# Patient Record
Sex: Male | Born: 1965
Health system: Southern US, Community
[De-identification: ages and names within clinical notes are randomized; demographics above are authoritative.]

## PROBLEM LIST (undated history)

## (undated) DIAGNOSIS — I219 Acute myocardial infarction, unspecified: Secondary | ICD-10-CM

## (undated) DIAGNOSIS — F419 Anxiety disorder, unspecified: Secondary | ICD-10-CM

## (undated) DIAGNOSIS — I2510 Atherosclerotic heart disease of native coronary artery without angina pectoris: Secondary | ICD-10-CM

## (undated) DIAGNOSIS — E7800 Pure hypercholesterolemia, unspecified: Secondary | ICD-10-CM

## (undated) DIAGNOSIS — I480 Paroxysmal atrial fibrillation: Secondary | ICD-10-CM

## (undated) DIAGNOSIS — I509 Heart failure, unspecified: Secondary | ICD-10-CM

## (undated) DIAGNOSIS — I10 Essential (primary) hypertension: Secondary | ICD-10-CM

## (undated) DIAGNOSIS — Z720 Tobacco use: Secondary | ICD-10-CM

## (undated) DIAGNOSIS — E785 Hyperlipidemia, unspecified: Secondary | ICD-10-CM

## (undated) DIAGNOSIS — E119 Type 2 diabetes mellitus without complications: Secondary | ICD-10-CM

## (undated) DIAGNOSIS — J45909 Unspecified asthma, uncomplicated: Secondary | ICD-10-CM

## (undated) DIAGNOSIS — I5042 Chronic combined systolic (congestive) and diastolic (congestive) heart failure: Secondary | ICD-10-CM

## (undated) HISTORY — PX: CARDIAC CATHETERIZATION: SHX172

## (undated) HISTORY — DX: Chronic combined systolic (congestive) and diastolic (congestive) heart failure: I50.42

## (undated) HISTORY — DX: Paroxysmal atrial fibrillation: I48.0

## (undated) HISTORY — DX: Hyperlipidemia, unspecified: E78.5

## (undated) HISTORY — DX: Tobacco use: Z72.0

## (undated) HISTORY — DX: Type 2 diabetes mellitus without complications: E11.9

---

## 2017-10-04 ENCOUNTER — Encounter (HOSPITAL_BASED_OUTPATIENT_CLINIC_OR_DEPARTMENT_OTHER): Payer: Self-pay

## 2017-10-04 ENCOUNTER — Emergency Department (HOSPITAL_BASED_OUTPATIENT_CLINIC_OR_DEPARTMENT_OTHER): Payer: Self-pay

## 2017-10-04 ENCOUNTER — Emergency Department (HOSPITAL_COMMUNITY): Admission: EM | Admit: 2017-10-04 | Discharge: 2017-10-04 | Discharge status: Against Medical Advice | Payer: Self-pay

## 2017-10-04 ENCOUNTER — Emergency Department (HOSPITAL_BASED_OUTPATIENT_CLINIC_OR_DEPARTMENT_OTHER)
Admission: EM | Admit: 2017-10-04 | Discharge: 2017-10-04 | Disposition: A | Discharge status: Home / Self Care | Payer: Self-pay | Attending: Emergency Medicine | Admitting: Emergency Medicine

## 2017-10-04 ENCOUNTER — Other Ambulatory Visit: Payer: Self-pay

## 2017-10-04 DIAGNOSIS — K4091 Unilateral inguinal hernia, without obstruction or gangrene, recurrent: Secondary | ICD-10-CM

## 2017-10-04 DIAGNOSIS — I252 Old myocardial infarction: Secondary | ICD-10-CM | POA: Insufficient documentation

## 2017-10-04 DIAGNOSIS — E119 Type 2 diabetes mellitus without complications: Secondary | ICD-10-CM

## 2017-10-04 DIAGNOSIS — I2510 Atherosclerotic heart disease of native coronary artery without angina pectoris: Secondary | ICD-10-CM | POA: Insufficient documentation

## 2017-10-04 DIAGNOSIS — F17210 Nicotine dependence, cigarettes, uncomplicated: Secondary | ICD-10-CM | POA: Insufficient documentation

## 2017-10-04 DIAGNOSIS — I10 Essential (primary) hypertension: Secondary | ICD-10-CM

## 2017-10-04 DIAGNOSIS — K439 Ventral hernia without obstruction or gangrene: Secondary | ICD-10-CM

## 2017-10-04 DIAGNOSIS — J45909 Unspecified asthma, uncomplicated: Secondary | ICD-10-CM | POA: Insufficient documentation

## 2017-10-04 HISTORY — DX: Atherosclerotic heart disease of native coronary artery without angina pectoris: I25.10

## 2017-10-04 HISTORY — DX: Essential (primary) hypertension: I10

## 2017-10-04 HISTORY — DX: Acute myocardial infarction, unspecified: I21.9

## 2017-10-04 HISTORY — DX: Unspecified asthma, uncomplicated: J45.909

## 2017-10-04 HISTORY — DX: Pure hypercholesterolemia, unspecified: E78.00

## 2017-10-04 LAB — URINALYSIS, ROUTINE W REFLEX MICROSCOPIC
Bilirubin Urine: NEGATIVE
Glucose, UA: >=500 mg/dL — AB
Ketones, ur: NEGATIVE mg/dL
LEUKOCYTES UA: NEGATIVE
NITRITE: NEGATIVE
PROTEIN: 100 mg/dL — AB
Specific Gravity, Urine: 1.025 (ref 1.005–1.030)
pH: 6 (ref 5.0–8.0)

## 2017-10-04 LAB — COMPREHENSIVE METABOLIC PANEL
ALK PHOS: 70 U/L (ref 38–126)
ALT: 45 U/L — AB (ref 0–44)
AST: 28 U/L (ref 15–41)
Albumin: 4.4 g/dL (ref 3.5–5.0)
Anion gap: 11 (ref 5–15)
BUN: 10 mg/dL (ref 6–20)
CO2: 26 mmol/L (ref 22–32)
CREATININE: 0.85 mg/dL (ref 0.61–1.24)
Calcium: 9 mg/dL (ref 8.9–10.3)
Chloride: 99 mmol/L (ref 98–111)
Glucose, Bld: 314 mg/dL — ABNORMAL HIGH (ref 70–99)
Potassium: 3.8 mmol/L (ref 3.5–5.1)
Sodium: 136 mmol/L (ref 135–145)
Total Bilirubin: 0.5 mg/dL (ref 0.3–1.2)
Total Protein: 7.9 g/dL (ref 6.5–8.1)

## 2017-10-04 LAB — CBC WITH DIFFERENTIAL/PLATELET
BASOS ABS: 0.1 10*3/uL (ref 0.0–0.1)
Basophils Relative: 1 %
Eosinophils Absolute: 0.2 10*3/uL (ref 0.0–0.7)
Eosinophils Relative: 2 %
HCT: 48 % (ref 39.0–52.0)
Hemoglobin: 16.5 g/dL (ref 13.0–17.0)
LYMPHS PCT: 33 %
Lymphs Abs: 3.1 10*3/uL (ref 0.7–4.0)
MCH: 24.8 pg — ABNORMAL LOW (ref 26.0–34.0)
MCHC: 34.4 g/dL (ref 30.0–36.0)
MCV: 72.3 fL — ABNORMAL LOW (ref 78.0–100.0)
MONO ABS: 0.8 10*3/uL (ref 0.1–1.0)
Monocytes Relative: 8 %
NEUTROS PCT: 56 %
Neutro Abs: 5.3 10*3/uL (ref 1.7–7.7)
PLATELETS: 222 10*3/uL (ref 150–400)
RBC: 6.64 MIL/uL — AB (ref 4.22–5.81)
RDW: 14.9 % (ref 11.5–15.5)
WBC: 9.5 10*3/uL (ref 4.0–10.5)

## 2017-10-04 LAB — URINALYSIS, MICROSCOPIC (REFLEX): WBC, UA: NONE SEEN WBC/hpf (ref 0–5)

## 2017-10-04 MED ORDER — METFORMIN HCL 500 MG PO TABS: 500.0000 mg | ORAL_TABLET | Freq: Two times a day (BID) | ORAL | 0 refills | Status: DC

## 2017-10-04 MED ORDER — ONDANSETRON HCL 4 MG/2ML IJ SOLN
4.0000 mg | Freq: Once | INTRAMUSCULAR | Status: AC
  Administered 2017-10-04: 4 mg via INTRAVENOUS
  Filled 2017-10-04: qty 2

## 2017-10-04 MED ORDER — SODIUM CHLORIDE 0.9 % IV BOLUS
1000.0000 mL | Freq: Once | INTRAVENOUS | Status: AC
  Administered 2017-10-04: 1000 mL via INTRAVENOUS

## 2017-10-04 MED ORDER — AMLODIPINE BESYLATE 5 MG PO TABS: 5.0000 mg | ORAL_TABLET | Freq: Every day | ORAL | 0 refills | Status: DC

## 2017-10-04 MED ORDER — SODIUM CHLORIDE 0.9 % IV BOLUS: 1000.0000 mL | Freq: Once | INTRAVENOUS | Status: DC

## 2017-10-04 MED ORDER — MORPHINE SULFATE (PF) 4 MG/ML IV SOLN
4.0000 mg | Freq: Once | INTRAVENOUS | Status: AC
  Administered 2017-10-04: 4 mg via INTRAVENOUS
  Filled 2017-10-04: qty 1

## 2017-10-04 NOTE — ED Notes (Signed)
Pt denies hx of DM, but states he has not been to a doctor in a very long time, endorses increased thirst and increased urination lately

## 2017-10-04 NOTE — ED Triage Notes (Signed)
Called pt from lobby NO response

## 2017-10-04 NOTE — ED Provider Notes (Signed)
[facility] EMERGENCY DEPARTMENT Provider Note   CSN: [id number] Arrival date & time: 10/04/17  2021     History   Chief Complaint Chief Complaint  Patient presents with  . Groin Pain    HPI Larry Lewis is a 52 y.o. male.  Pt presents to the ED today with right sided groin pain.  The pt has noticed some swelling in his groin for a few months.  He also has had difficulty urinating.  Yesterday, he developed right sided flank pain.  He went to urgent care and they sent him here.  Pt denies f/c.     Past Medical History:  Diagnosis Date  . AMI (acute myocardial infarction) (HCC)   . Asthma   . Coronary artery disease   . High cholesterol   . Hypertension     There are no active problems to display for this patient.   Past Surgical History:  Procedure Laterality Date  . CARDIAC CATHETERIZATION          Home Medications    Prior to Admission medications   Medication Sig Start Date End Date Taking? Authorizing Provider  amLODipine (NORVASC) 5 MG tablet Take 1 tablet (5 mg total) by mouth daily. 10/04/17   Jacalyn Lefevre, MD  metFORMIN (GLUCOPHAGE) 500 MG tablet Take 1 tablet (500 mg total) by mouth 2 (two) times daily with a meal. 10/04/17   Jacalyn Lefevre, MD    Family History No family history on file.  Social History Social History   Tobacco Use  . Smoking status: Current Every Day Smoker    Packs/day: 1.00    Types: Cigarettes  . Smokeless tobacco: Never Used  Substance Use Topics  . Alcohol use: Never    Frequency: Never  . Drug use: Never     Allergies   Penicillins   Review of Systems Review of Systems  Genitourinary: Positive for difficulty urinating and flank pain.  All other systems reviewed and are negative.    Physical Exam Updated Vital Signs BP (!) 160/111 (BP Location: Right Arm)   Pulse (!) 102   Temp 98.2 F (36.8 C) (Oral)   Resp 18   Ht 5\' 8"  (1.727 m)   Wt 85.4 kg (188 lb 4.4 oz)   SpO2 97%   BMI 28.63  kg/m   Physical Exam  Constitutional: He is oriented to person, place, and time. He appears well-developed and well-nourished.  HENT:  Head: Normocephalic and atraumatic.  Right Ear: External ear normal.  Left Ear: External ear normal.  Nose: Nose normal.  Mouth/Throat: Oropharynx is clear and moist.  Eyes: Pupils are equal, round, and reactive to light. Conjunctivae and EOM are normal.  Neck: Normal range of motion. Neck supple.  Cardiovascular: Regular rhythm, normal heart sounds and intact distal pulses. Tachycardia present.  Pulmonary/Chest: Effort normal and breath sounds normal.  Abdominal: Soft. Bowel sounds are normal. A hernia is present. Hernia confirmed positive in the right inguinal area.  Ventral hernia.  Easily reducible.  Right inguinal hernia.  Easily reducible.  Musculoskeletal: Normal range of motion.  Neurological: He is alert and oriented to person, place, and time.  Skin: Skin is warm. Capillary refill takes less than 2 seconds.  Psychiatric: He has a normal mood and affect. His behavior is normal. Judgment and thought content normal.  Nursing note and vitals reviewed.    ED Treatments / Results  Labs (all labs ordered are listed, but only abnormal results are displayed) Labs Reviewed  COMPREHENSIVE  METABOLIC PANEL - Abnormal; Notable for the following components:      Result Value   Glucose, Bld 314 (*)    ALT 45 (*)    All other components within normal limits  CBC WITH DIFFERENTIAL/PLATELET - Abnormal; Notable for the following components:   RBC 6.64 (*)    MCV 72.3 (*)    MCH 24.8 (*)    All other components within normal limits  URINALYSIS, ROUTINE W REFLEX MICROSCOPIC - Abnormal; Notable for the following components:   Glucose, UA >=500 (*)    Hgb urine dipstick TRACE (*)    Protein, ur 100 (*)    All other components within normal limits  URINALYSIS, MICROSCOPIC (REFLEX) - Abnormal; Notable for the following components:   Bacteria, UA RARE  (*)    All other components within normal limits  URINE CULTURE    EKG None  Radiology Ct Renal Stone Study  Result Date: 10/04/2017 CLINICAL DATA:  Right groin and flank pain. EXAM: CT ABDOMEN AND PELVIS WITHOUT CONTRAST TECHNIQUE: Multidetector CT imaging of the abdomen and pelvis was performed following the standard protocol without IV contrast. COMPARISON:  None. FINDINGS: Lower chest: We partially include a pulmonary nodule in the left lower lobe. The included portion of the nodule measures up to 0.9 by 0.6 cm on image 1/4. 0.6 by 0.4 cm by 0.2 cm nodule in the right lower lobe. Circumflex and right coronary artery atherosclerotic calcification is present. Hepatobiliary: Diffuse hepatic steatosis. A gallstone in the gallbladder measures up to 1.2 cm in diameter. Pancreas: Unremarkable Spleen: Unremarkable Adrenals/Urinary Tract: Several faint hypodense lesions in the right kidney are probably cysts but technically too small to characterize. No urinary tract calculi identified. Indirect right inguinal hernia contains a portion of the urinary bladder as well as a portion of the appendix. Stomach/Bowel: Amyand hernia noted without findings of acute appendicitis. Prominent stool throughout the colon favors constipation. Vascular/Lymphatic: Aortoiliac atherosclerotic vascular disease. Reproductive: Unremarkable Other: No supplemental non-categorized findings. Musculoskeletal: Degenerative disc disease L5-S1 with and left paracentral disc protrusion and facet arthropathy contributing to bilateral foraminal impingement. IMPRESSION: 1. Indirect right inguinal hernia containing adipose tissue, part of the vermiform appendix (Amyand hernia), as well as a portion of the right urinary bladder. No findings of acute appendicitis. 2. The included portion of a left lower lobe pulmonary nodule measures 6 by 9 mm on the top most image. Non-contrast chest CT at 6-12 months is recommended. If the nodule is stable at  time of repeat CT, then future CT at 18-24 months (from today's scan) is considered optional for low-risk patients, but is recommended for high-risk patients. This recommendation follows the consensus statement: Guidelines for Management of Incidental Pulmonary Nodules Detected on CT Images: From the Fleischner Society 2017; Radiology 2017; 284:228-243. 3.  Aortic Atherosclerosis (ICD10-I70.0).  Coronary atherosclerosis. 4. Diffuse hepatic steatosis. 5. Cholelithiasis. 6.  Prominent stool throughout the colon favors constipation. 7. Bilateral foraminal impingement at L5-S1. Electronically Signed   By: Gaylyn RongWalter  Liebkemann M.D.   On: 10/04/2017 21:39    Procedures Procedures (including critical care time)  Medications Ordered in ED Medications  sodium chloride 0.9 % bolus 1,000 mL (1,000 mLs Intravenous New Bag/Given 10/04/17 2112)  ondansetron (ZOFRAN) injection 4 mg (4 mg Intravenous Given 10/04/17 2124)  morphine 4 MG/ML injection 4 mg (4 mg Intravenous Given 10/04/17 2125)     Initial Impression / Assessment and Plan / ED Course  I have reviewed the triage vital signs and the nursing notes.  Pertinent labs & imaging results that were available during my care of the patient were reviewed by me and considered in my medical decision making (see chart for details).     Pt is feeling much better.  Pt is a new onset diabetic patient.   No DKA. The pt does not have a doctor.  I made a referral to case management to try to get pt a doctor and meds and diabetic education.  The pt is encouraged to return if worse.  Final Clinical Impressions(s) / ED Diagnoses   Final diagnoses:  Essential hypertension  New onset type 2 diabetes mellitus (HCC)  Unilateral recurrent inguinal hernia without obstruction or gangrene  Ventral hernia without obstruction or gangrene    ED Discharge Orders        Ordered    Ambulatory referral to Nutrition and Diabetic Education     10/04/17 2214    metFORMIN  (GLUCOPHAGE) 500 MG tablet  2 times daily with meals     10/04/17 2215    amLODipine (NORVASC) 5 MG tablet  Daily     10/04/17 2215       Jacalyn Lefevre, MD 10/04/17 2218

## 2017-10-04 NOTE — ED Triage Notes (Signed)
R groin and flank pain x 2 months. Pain got worse yesterday and pt c/o dysuria now. Pt saw and U/C care today and sent here to r/o kidney stone.

## 2017-10-05 NOTE — Care Management Note (Signed)
Case Management Note  CM consulted for no pcp and no ins with need for DM follow up.  CM noted [facility] information was placed on AVS and pt can make an appointment on his own.  CM sent a message to the [facility] for possible appointment cancellation openings.  Updated Dr. Particia NearingHaviland via messages.  No further CM needs noted at this time.  Adalaya Irion, Lynnae SandhoffAngela N, RN 10/05/2017, 10:06 AM

## 2017-10-06 LAB — URINE CULTURE: Culture: NO GROWTH

## 2017-10-09 ENCOUNTER — Telehealth (INDEPENDENT_AMBULATORY_CARE_PROVIDER_SITE_OTHER): Payer: Self-pay

## 2017-10-09 NOTE — Telephone Encounter (Signed)
Message received from Eldridge AbrahamsAngela Kritzer, RN CM requesting a hospital [facility]. Informed her that there are not any appointments available. [facility] and the patient will need to call to check availability and schedule an appointment.   Update provided to Breck CoonsA. Kritzer, RN CM

## 2017-10-11 ENCOUNTER — Telehealth: Payer: Self-pay

## 2017-10-11 NOTE — Telephone Encounter (Signed)
Attempted to contact the patient to schedule a hospital follow up appointment as there is an appointment available 10/19/17. Call placed to # [phone] and this CM was informed that it is a wrong number

## 2019-07-04 ENCOUNTER — Emergency Department (HOSPITAL_BASED_OUTPATIENT_CLINIC_OR_DEPARTMENT_OTHER): Payer: Self-pay

## 2019-07-04 ENCOUNTER — Other Ambulatory Visit: Payer: Self-pay

## 2019-07-04 ENCOUNTER — Inpatient Hospital Stay (HOSPITAL_BASED_OUTPATIENT_CLINIC_OR_DEPARTMENT_OTHER)
Admission: EM | Admit: 2019-07-04 | Discharge: 2019-07-11 | DRG: 286 | Disposition: A | Discharge status: Home / Self Care | Payer: Self-pay | Attending: Family Medicine | Admitting: Family Medicine

## 2019-07-04 ENCOUNTER — Encounter (HOSPITAL_BASED_OUTPATIENT_CLINIC_OR_DEPARTMENT_OTHER): Payer: Self-pay

## 2019-07-04 DIAGNOSIS — E876 Hypokalemia: Secondary | ICD-10-CM | POA: Diagnosis present

## 2019-07-04 DIAGNOSIS — E1165 Type 2 diabetes mellitus with hyperglycemia: Secondary | ICD-10-CM | POA: Diagnosis present

## 2019-07-04 DIAGNOSIS — G4700 Insomnia, unspecified: Secondary | ICD-10-CM | POA: Diagnosis present

## 2019-07-04 DIAGNOSIS — R590 Localized enlarged lymph nodes: Secondary | ICD-10-CM | POA: Diagnosis present

## 2019-07-04 DIAGNOSIS — Z955 Presence of coronary angioplasty implant and graft: Secondary | ICD-10-CM

## 2019-07-04 DIAGNOSIS — Z794 Long term (current) use of insulin: Secondary | ICD-10-CM

## 2019-07-04 DIAGNOSIS — R0902 Hypoxemia: Secondary | ICD-10-CM

## 2019-07-04 DIAGNOSIS — K4090 Unilateral inguinal hernia, without obstruction or gangrene, not specified as recurrent: Secondary | ICD-10-CM | POA: Diagnosis present

## 2019-07-04 DIAGNOSIS — R000 Tachycardia, unspecified: Secondary | ICD-10-CM | POA: Diagnosis present

## 2019-07-04 DIAGNOSIS — Z7982 Long term (current) use of aspirin: Secondary | ICD-10-CM

## 2019-07-04 DIAGNOSIS — Z20822 Contact with and (suspected) exposure to covid-19: Secondary | ICD-10-CM | POA: Diagnosis present

## 2019-07-04 DIAGNOSIS — E785 Hyperlipidemia, unspecified: Secondary | ICD-10-CM | POA: Diagnosis present

## 2019-07-04 DIAGNOSIS — I252 Old myocardial infarction: Secondary | ICD-10-CM

## 2019-07-04 DIAGNOSIS — T501X5A Adverse effect of loop [high-ceiling] diuretics, initial encounter: Secondary | ICD-10-CM | POA: Diagnosis present

## 2019-07-04 DIAGNOSIS — F17210 Nicotine dependence, cigarettes, uncomplicated: Secondary | ICD-10-CM | POA: Diagnosis present

## 2019-07-04 DIAGNOSIS — J90 Pleural effusion, not elsewhere classified: Secondary | ICD-10-CM | POA: Diagnosis present

## 2019-07-04 DIAGNOSIS — I5041 Acute combined systolic (congestive) and diastolic (congestive) heart failure: Secondary | ICD-10-CM | POA: Diagnosis present

## 2019-07-04 DIAGNOSIS — I358 Other nonrheumatic aortic valve disorders: Secondary | ICD-10-CM | POA: Diagnosis present

## 2019-07-04 DIAGNOSIS — J9601 Acute respiratory failure with hypoxia: Secondary | ICD-10-CM | POA: Diagnosis present

## 2019-07-04 DIAGNOSIS — Z880 Allergy status to penicillin: Secondary | ICD-10-CM

## 2019-07-04 DIAGNOSIS — I25119 Atherosclerotic heart disease of native coronary artery with unspecified angina pectoris: Secondary | ICD-10-CM | POA: Diagnosis present

## 2019-07-04 DIAGNOSIS — Z7902 Long term (current) use of antithrombotics/antiplatelets: Secondary | ICD-10-CM

## 2019-07-04 DIAGNOSIS — F419 Anxiety disorder, unspecified: Secondary | ICD-10-CM | POA: Diagnosis present

## 2019-07-04 DIAGNOSIS — I2510 Atherosclerotic heart disease of native coronary artery without angina pectoris: Secondary | ICD-10-CM

## 2019-07-04 DIAGNOSIS — I248 Other forms of acute ischemic heart disease: Secondary | ICD-10-CM | POA: Diagnosis present

## 2019-07-04 DIAGNOSIS — I110 Hypertensive heart disease with heart failure: Principal | ICD-10-CM | POA: Diagnosis present

## 2019-07-04 DIAGNOSIS — J45909 Unspecified asthma, uncomplicated: Secondary | ICD-10-CM | POA: Diagnosis present

## 2019-07-04 DIAGNOSIS — J9811 Atelectasis: Secondary | ICD-10-CM | POA: Diagnosis present

## 2019-07-04 DIAGNOSIS — K8020 Calculus of gallbladder without cholecystitis without obstruction: Secondary | ICD-10-CM | POA: Diagnosis present

## 2019-07-04 DIAGNOSIS — I509 Heart failure, unspecified: Secondary | ICD-10-CM

## 2019-07-04 DIAGNOSIS — Z720 Tobacco use: Secondary | ICD-10-CM | POA: Diagnosis present

## 2019-07-04 DIAGNOSIS — E7800 Pure hypercholesterolemia, unspecified: Secondary | ICD-10-CM | POA: Diagnosis present

## 2019-07-04 DIAGNOSIS — Z79899 Other long term (current) drug therapy: Secondary | ICD-10-CM

## 2019-07-04 HISTORY — DX: Anxiety disorder, unspecified: F41.9

## 2019-07-04 LAB — CBC
HCT: 50.9 % (ref 39.0–52.0)
Hemoglobin: 15.9 g/dL (ref 13.0–17.0)
MCH: 24.3 pg — ABNORMAL LOW (ref 26.0–34.0)
MCHC: 31.2 g/dL (ref 30.0–36.0)
MCV: 77.7 fL — ABNORMAL LOW (ref 80.0–100.0)
Platelets: 312 10*3/uL (ref 150–400)
RBC: 6.55 MIL/uL — ABNORMAL HIGH (ref 4.22–5.81)
RDW: 14.5 % (ref 11.5–15.5)
WBC: 9.1 10*3/uL (ref 4.0–10.5)
nRBC: 0 % (ref 0.0–0.2)

## 2019-07-04 LAB — BASIC METABOLIC PANEL
Anion gap: 11 (ref 5–15)
BUN: 8 mg/dL (ref 6–20)
CO2: 25 mmol/L (ref 22–32)
Calcium: 9.4 mg/dL (ref 8.9–10.3)
Chloride: 100 mmol/L (ref 98–111)
Creatinine, Ser: 0.79 mg/dL (ref 0.61–1.24)
GFR calc Af Amer: >60 mL/min (ref >60)
GFR calc non Af Amer: >60 mL/min (ref >60)
Glucose, Bld: 381 mg/dL — ABNORMAL HIGH (ref 70–99)
Potassium: 3.6 mmol/L (ref 3.5–5.1)
Sodium: 136 mmol/L (ref 135–145)

## 2019-07-04 LAB — TROPONIN I (HIGH SENSITIVITY)
Troponin I (High Sensitivity): 25 ng/L — ABNORMAL HIGH (ref <18)
Troponin I (High Sensitivity): 25 ng/L — ABNORMAL HIGH (ref <18)

## 2019-07-04 LAB — RESPIRATORY PANEL BY RT PCR (FLU A&B, COVID)
Influenza A by PCR: NEGATIVE
Influenza B by PCR: NEGATIVE
SARS Coronavirus 2 by RT PCR: NEGATIVE

## 2019-07-04 LAB — BRAIN NATRIURETIC PEPTIDE: B Natriuretic Peptide: 267.6 pg/mL — ABNORMAL HIGH (ref 0.0–100.0)

## 2019-07-04 LAB — CBG MONITORING, ED: Glucose-Capillary: 97 mg/dL (ref 70–99)

## 2019-07-04 MED ORDER — INSULIN ASPART 100 UNIT/ML ~~LOC~~ SOLN
SUBCUTANEOUS | Status: AC
  Administered 2019-07-04: 10 [IU] via INTRAVENOUS
  Filled 2019-07-04: qty 1

## 2019-07-04 MED ORDER — FUROSEMIDE 10 MG/ML IJ SOLN
40.0000 mg | Freq: Once | INTRAMUSCULAR | Status: AC
  Administered 2019-07-04: 20:00:00 40 mg via INTRAVENOUS
  Filled 2019-07-04: qty 4

## 2019-07-04 MED ORDER — NITROGLYCERIN 0.4 MG SL SUBL
0.4000 mg | SUBLINGUAL_TABLET | SUBLINGUAL | Status: DC | PRN
Start: 2019-07-04 — End: 2019-07-11
  Administered 2019-07-04 – 2019-07-10 (×2): 0.4 mg via SUBLINGUAL
  Filled 2019-07-04 (×2): qty 1

## 2019-07-04 MED ORDER — IOHEXOL 350 MG/ML SOLN
100.0000 mL | Freq: Once | INTRAVENOUS | Status: AC | PRN
  Administered 2019-07-04: 22:00:00 100 mL via INTRAVENOUS

## 2019-07-04 MED ORDER — INSULIN ASPART 100 UNIT/ML IV SOLN
10.0000 [IU] | Freq: Once | INTRAVENOUS | Status: AC
  Filled 2019-07-04: qty 0.1

## 2019-07-04 MED ORDER — INSULIN REGULAR HUMAN 100 UNIT/ML IJ SOLN
10.0000 [IU] | Freq: Once | INTRAMUSCULAR | Status: DC
  Filled 2019-07-04: qty 3

## 2019-07-04 NOTE — ED Triage Notes (Signed)
Pt c/o difficulty breathing today. Pt is anxious in appearance. Pt is poor historian. States he has trouble breathing every night for "a long time." Pt states he gets "choked up" at night when he sleeps. Problem is not positional, stating no position makes it better or worse. Pt able to be distracted from hyperventilations and coached to slow breathing. Pt SpO2 93% during triage and lung sounds clear & =.   Pt's native language is Arabic, but is able to speak [place].

## 2019-07-04 NOTE — ED Provider Notes (Signed)
[facility] EMERGENCY DEPARTMENT Provider Note   CSN: [id number] Arrival date & time: 07/04/19  1746     History Chief Complaint  Patient presents with  . Respiratory Distress    Larry Lewis is a 54 y.o. male.  Patient just returned after spending a year in [place] which is his home country 2 weeks ago.  Patient states that for the past 4 months that he has been having some trouble breathing and needs to this sleep sort of propped up.  He gets choked up at night when he sleeps.  Patient states no position makes it better.  But he is definitely more comfortable sitting up.  He was in some respiratory distress when he arrived.  Oxygen saturations maybe at best 93%.  Was taken to the recess room started on 4 L of oxygen.  Was tachycardic blood pressure was not elevated or hypotensive.  Respiratory rate was 45 when he first arrived.  On the oxygen he improved.  Past medical history although we do not have much documentation for talks about coronary artery disease history of asthma hypertension and high cholesterol.  Apparently had a cardiac catheterization in the past.  Patient does smoke.  No wheezing was heard.  Patient denies any chest pain denies any leg swelling.  Patient states symptoms been present for 4 months.  But it seems as if they are certainly worse today.  Patient does not use oxygen at home.        Past Medical History:  Diagnosis Date  . AMI (acute myocardial infarction) (HCC)   . Anxiety   . Asthma   . Coronary artery disease   . High cholesterol   . Hypertension     Patient Active Problem List   Diagnosis Date Noted  . Acute respiratory failure with hypoxia (HCC) 07/04/2019    Past Surgical History:  Procedure Laterality Date  . CARDIAC CATHETERIZATION         No family history on file.  Social History   Tobacco Use  . Smoking status: Current Every Day Smoker    Packs/day: 1.00    Types: Cigarettes  . Smokeless tobacco: Never Used   Substance Use Topics  . Alcohol use: Never  . Drug use: Never    Home Medications Prior to Admission medications   Medication Sig Start Date End Date Taking? Authorizing Provider  amLODipine (NORVASC) 5 MG tablet Take 1 tablet (5 mg total) by mouth daily. 10/04/17   Jacalyn Lefevre, MD  metFORMIN (GLUCOPHAGE) 500 MG tablet Take 1 tablet (500 mg total) by mouth 2 (two) times daily with a meal. 10/04/17   Jacalyn Lefevre, MD    Allergies    Penicillins  Review of Systems   Review of Systems  Constitutional: Negative for chills and fever.  HENT: Negative for congestion, rhinorrhea and sore throat.   Eyes: Negative for visual disturbance.  Respiratory: Positive for shortness of breath. Negative for cough.   Cardiovascular: Negative for chest pain and leg swelling.  Gastrointestinal: Negative for abdominal pain, diarrhea, nausea and vomiting.  Genitourinary: Negative for dysuria.  Musculoskeletal: Negative for back pain and neck pain.  Skin: Negative for rash.  Neurological: Negative for dizziness, light-headedness and headaches.  Hematological: Does not bruise/bleed easily.  Psychiatric/Behavioral: Negative for confusion.    Physical Exam Updated Vital Signs BP 124/90   Pulse (!) 106   Resp (!) 31   Ht 1.753 m (5\' 9" )   Wt 80.7 kg   SpO2 93%  BMI 26.29 kg/m   Physical Exam Vitals and nursing note reviewed.  Constitutional:      Appearance: Normal appearance. He is well-developed.  HENT:     Head: Normocephalic and atraumatic.  Eyes:     Extraocular Movements: Extraocular movements intact.     Conjunctiva/sclera: Conjunctivae normal.     Pupils: Pupils are equal, round, and reactive to light.  Cardiovascular:     Rate and Rhythm: Regular rhythm. Tachycardia present.     Heart sounds: No murmur.  Pulmonary:     Effort: Respiratory distress present.     Breath sounds: Normal breath sounds. No wheezing.  Abdominal:     Palpations: Abdomen is soft.     Tenderness:  There is no abdominal tenderness.  Musculoskeletal:        General: Normal range of motion.     Cervical back: Normal range of motion and neck supple.     Right lower leg: No edema.     Left lower leg: No edema.  Skin:    General: Skin is warm and dry.     Capillary Refill: Capillary refill takes less than 2 seconds.  Neurological:     General: No focal deficit present.     Mental Status: He is alert and oriented to person, place, and time.     Cranial Nerves: No cranial nerve deficit.     Sensory: No sensory deficit.     Motor: No weakness.     ED Results / Procedures / Treatments   Labs (all labs ordered are listed, but only abnormal results are displayed) Labs Reviewed  BASIC METABOLIC PANEL - Abnormal; Notable for the following components:      Result Value   Glucose, Bld 381 (*)    All other components within normal limits  CBC - Abnormal; Notable for the following components:   RBC 6.55 (*)    MCV 77.7 (*)    MCH 24.3 (*)    All other components within normal limits  BRAIN NATRIURETIC PEPTIDE - Abnormal; Notable for the following components:   B Natriuretic Peptide 267.6 (*)    All other components within normal limits  TROPONIN I (HIGH SENSITIVITY) - Abnormal; Notable for the following components:   Troponin I (High Sensitivity) 25 (*)    All other components within normal limits  TROPONIN I (HIGH SENSITIVITY) - Abnormal; Notable for the following components:   Troponin I (High Sensitivity) 25 (*)    All other components within normal limits  RESPIRATORY PANEL BY RT PCR (FLU A&B, COVID)    EKG EKG Interpretation  Date/Time:  Thursday July 04 2019 19:52:33 EDT Ventricular Rate:  123 PR Interval:    QRS Duration: 112 QT Interval:  331 QTC Calculation: 474 R Axis:   77 Text Interpretation: Sinus tachycardia Probable LVH with secondary repol abnrm ST depression, consider ischemia, diffuse lds Anterior ST elevation, probably due to LVH Baseline wander in  lead(s) V1 Confirmed by Vanetta Mulders [phone]) on 07/04/2019 9:44:19 PM   Radiology CT Angio Chest PE W/Cm &/Or Wo Cm  Result Date: 07/04/2019 CLINICAL DATA:  Shortness of breath EXAM: CT ANGIOGRAPHY CHEST WITH CONTRAST TECHNIQUE: Multidetector CT imaging of the chest was performed using the standard protocol during bolus administration of intravenous contrast. Multiplanar CT image reconstructions and MIPs were obtained to evaluate the vascular anatomy. CONTRAST:  OMNIPAQUE IOHEXOL 350 MG/ML SOLN COMPARISON:  07/04/2019 FINDINGS: Cardiovascular: This is a technically adequate evaluation of the pulmonary vasculature. No filling defects  or pulmonary emboli. There is prominent left ventricular and left atrial dilatation. No pericardial effusion. Mild atherosclerosis of the aorta and coronary vessels. No evidence of thoracic aortic aneurysm or dissection. Mediastinum/Nodes: Subcarinal adenopathy measuring up to 14 mm in short axis. Borderline enlarged pretracheal nodes. These are nonspecific. Thyroid, trachea, and esophagus are unremarkable. Lungs/Pleura: There are moderate bilateral pleural effusions, estimated 1 L each. Areas of compressive atelectasis are seen within the dependent lower lobes. No airspace disease or pneumothorax. Central airways are patent. Upper Abdomen: Calcified gallstone is identified.  No cholecystitis. Musculoskeletal: No acute or destructive bony lesions. Reconstructed images demonstrate no additional findings. Review of the MIP images confirms the above findings. IMPRESSION: 1. No evidence of pulmonary embolus. 2. Moderate bilateral pleural effusions and dependent atelectasis. 3. Cardiomegaly, with prominent left atrial and left ventricular dilatation. 4. Likely reactive mediastinal adenopathy. 5.  Aortic Atherosclerosis (ICD10-I70.0). 6. Cholelithiasis. Electronically Signed   By: Sharlet Salina M.D.   On: 07/04/2019 21:58   DG Chest Port 1 View  Result Date:  07/04/2019 CLINICAL DATA:  Shortness of breath EXAM: PORTABLE CHEST 1 VIEW COMPARISON:  None. FINDINGS: There is cardiomegaly with pulmonary venous hypertension. There is diffuse interstitial thickening with suspected interstitial edema. There is atelectatic change in the medial left base with slight airspace opacity. No pleural effusions evident. No adenopathy. No bone lesions. IMPRESSION: Cardiomegaly with pulmonary vascular congestion. There is interstitial thickening, likely due to interstitial edema. Medial left base atelectasis with suspect developing pneumonia or alveolar edema. Overall the appearance is concerning for a degree of congestive heart failure. There may well be superimposed pneumonia in the medial left base. Electronically Signed   By: Bretta Bang III M.D.   On: 07/04/2019 19:10    Procedures Procedures (including critical care time)  CRITICAL CARE Performed by: Vanetta Mulders Total critical care time: 30 minutes Critical care time was exclusive of separately billable procedures and treating other patients. Critical care was necessary to treat or prevent imminent or life-threatening deterioration. Critical care was time spent personally by me on the following activities: development of treatment plan with patient and/or surrogate as well as nursing, discussions with consultants, evaluation of patient's response to treatment, examination of patient, obtaining history from patient or surrogate, ordering and performing treatments and interventions, ordering and review of laboratory studies, ordering and review of radiographic studies, pulse oximetry and re-evaluation of patient's condition.    Medications Ordered in ED Medications  nitroGLYCERIN (NITROSTAT) SL tablet 0.4 mg (0.4 mg Sublingual Given 07/04/19 2012)  furosemide (LASIX) injection 40 mg (40 mg Intravenous Given 07/04/19 1930)  iohexol (OMNIPAQUE) 350 MG/ML injection 100 mL (100 mLs Intravenous Contrast Given  07/04/19 2146)  insulin aspart (novoLOG) injection 10 Units (10 Units Intravenous Given 07/04/19 2227)    ED Course  I have reviewed the triage vital signs and the nursing notes.  Pertinent labs & imaging results that were available during my care of the patient were reviewed by me and considered in my medical decision making (see chart for details).    MDM Rules/Calculators/A&P                     Patient presented in acute respiratory distress.  But on 4 L of oxygen he settled down.  Then he went on to have another attack of shortness of breath.  And was placed on 10 L high flow nasal cannula.  Feels better on that.  But shortly after that he was given 4 mg  of Lasix which made him feel tremendously better.  Covid testing was negative.  CT angio chest does not show pulmonary embolus.  Does show bilateral pleural effusions moderate size.  Clinically it seemed as if based on the chest x-ray as well and his elevated BNP that he had congestive heart failure.  Patient does not have a known history of congestive heart failure.  But he has a history of coronary artery disease high cholesterol and hypertension and apparently had a cardiac catheterization in the past.  Patient is spent the last year in [place].  Return back to the states 2 weeks ago.  Therefore also the concern for PE but that is been ruled out.  Patient not normally on Lasix.  Patient denied any chest pain troponins were 25x2 no significant delta.  No leukocytosis.  No significant electrolyte abnormalities.  In addition to the Lasix which made a significant difference patient received sublingual nitroglycerin.  Was not started on nitroglycerin drip.  Blood pressures were not elevated.  Patient is a smoker so there possibly could be a component of COPD on top of the CHF.  But we never heard any wheezing.  Discussed with hospitalist who will admit to telemetry.  Orders have been placed.  Final Clinical Impression(s) / ED Diagnoses Final  diagnoses:  Acute on chronic congestive heart failure, unspecified heart failure type ([place])  Pleural effusion, bilateral  Hypoxia    Rx / DC Orders ED Discharge Orders    None       Fredia Sorrow, MD 07/04/19 2312

## 2019-07-04 NOTE — ED Notes (Signed)
Noted pt SpO2 drops 88% with talking and moving. Pt placed on 4L via N/C.

## 2019-07-04 NOTE — Progress Notes (Signed)
Removed patient from 4 liter nasal cannula and placed patient on 10 liter Hi-Flo nasal cannula due to patient's SPO2 dropping below 90% and increased RR and WOB breathing.  Patient's SPO2 increased to 94% and WOB of breathing has decreased.

## 2019-07-04 NOTE — Progress Notes (Signed)
Ct delayed due to patient condition, RN to call when ready.

## 2019-07-04 NOTE — ED Notes (Signed)
Patient's son calls for help; patient sitting up in bed with difficulty breathing; Patient denies any pain; states having difficulty breathing and states feels like something is in this throat. RT at bedside to assess. Dr Deretha Emory at bedside.

## 2019-07-05 ENCOUNTER — Inpatient Hospital Stay (HOSPITAL_COMMUNITY): Payer: Self-pay

## 2019-07-05 DIAGNOSIS — Z794 Long term (current) use of insulin: Secondary | ICD-10-CM | POA: Diagnosis present

## 2019-07-05 DIAGNOSIS — E1165 Type 2 diabetes mellitus with hyperglycemia: Secondary | ICD-10-CM

## 2019-07-05 DIAGNOSIS — Z720 Tobacco use: Secondary | ICD-10-CM | POA: Diagnosis present

## 2019-07-05 DIAGNOSIS — J90 Pleural effusion, not elsewhere classified: Secondary | ICD-10-CM | POA: Diagnosis present

## 2019-07-05 DIAGNOSIS — I509 Heart failure, unspecified: Secondary | ICD-10-CM

## 2019-07-05 LAB — HEMOGLOBIN A1C
Hgb A1c MFr Bld: 12.2 % — ABNORMAL HIGH (ref 4.8–5.6)
Mean Plasma Glucose: 303.44 mg/dL

## 2019-07-05 LAB — GLUCOSE, CAPILLARY
Glucose-Capillary: 449 mg/dL — ABNORMAL HIGH (ref 70–99)
Glucose-Capillary: 376 mg/dL — ABNORMAL HIGH (ref 70–99)
Glucose-Capillary: 258 mg/dL — ABNORMAL HIGH (ref 70–99)

## 2019-07-05 LAB — URINALYSIS, ROUTINE W REFLEX MICROSCOPIC
Bacteria, UA: NONE SEEN
Bilirubin Urine: NEGATIVE
Glucose, UA: >=500 mg/dL — AB
Hgb urine dipstick: NEGATIVE
Ketones, ur: NEGATIVE mg/dL
Leukocytes,Ua: NEGATIVE
Nitrite: NEGATIVE
Protein, ur: NEGATIVE mg/dL
Specific Gravity, Urine: 1.002 — ABNORMAL LOW (ref 1.005–1.030)
pH: 7 (ref 5.0–8.0)

## 2019-07-05 LAB — CBG MONITORING, ED: Glucose-Capillary: 282 mg/dL — ABNORMAL HIGH (ref 70–99)

## 2019-07-05 LAB — HIV ANTIBODY (ROUTINE TESTING W REFLEX): HIV Screen 4th Generation wRfx: NONREACTIVE

## 2019-07-05 MED ORDER — INSULIN ASPART 100 UNIT/ML ~~LOC~~ SOLN
0.0000 [IU] | Freq: Every day | SUBCUTANEOUS | Status: DC
  Administered 2019-07-05: 3 [IU] via SUBCUTANEOUS

## 2019-07-05 MED ORDER — SODIUM CHLORIDE 0.9 % IV SOLN: 250.0000 mL | INTRAVENOUS | Status: DC | PRN

## 2019-07-05 MED ORDER — INSULIN ASPART 100 UNIT/ML ~~LOC~~ SOLN
0.0000 [IU] | Freq: Three times a day (TID) | SUBCUTANEOUS | Status: DC
  Administered 2019-07-06: 08:00:00 11 [IU] via SUBCUTANEOUS

## 2019-07-05 MED ORDER — INSULIN ASPART 100 UNIT/ML ~~LOC~~ SOLN
0.0000 [IU] | Freq: Three times a day (TID) | SUBCUTANEOUS | Status: DC
  Administered 2019-07-05: 17:00:00 9 [IU] via SUBCUTANEOUS

## 2019-07-05 MED ORDER — SODIUM CHLORIDE 0.9% FLUSH
3.0000 mL | Freq: Two times a day (BID) | INTRAVENOUS | Status: DC
  Administered 2019-07-05 – 2019-07-08 (×5): 3 mL via INTRAVENOUS

## 2019-07-05 MED ORDER — NICOTINE 21 MG/24HR TD PT24
21.0000 mg | MEDICATED_PATCH | Freq: Every day | TRANSDERMAL | Status: DC
  Administered 2019-07-05 – 2019-07-11 (×7): 21 mg via TRANSDERMAL
  Filled 2019-07-05 (×7): qty 1

## 2019-07-05 MED ORDER — ACETAMINOPHEN 325 MG PO TABS: 650.0000 mg | ORAL_TABLET | ORAL | Status: DC | PRN

## 2019-07-05 MED ORDER — INSULIN ASPART 100 UNIT/ML ~~LOC~~ SOLN
6.0000 [IU] | Freq: Once | SUBCUTANEOUS | Status: AC
  Administered 2019-07-05: 6 [IU] via SUBCUTANEOUS

## 2019-07-05 MED ORDER — FUROSEMIDE 10 MG/ML IJ SOLN
40.0000 mg | Freq: Two times a day (BID) | INTRAMUSCULAR | Status: DC
  Administered 2019-07-05 – 2019-07-08 (×6): 40 mg via INTRAVENOUS
  Filled 2019-07-05 (×6): qty 4

## 2019-07-05 MED ORDER — ONDANSETRON HCL 4 MG/2ML IJ SOLN: 4.0000 mg | Freq: Four times a day (QID) | INTRAMUSCULAR | Status: DC | PRN

## 2019-07-05 MED ORDER — ENOXAPARIN SODIUM 40 MG/0.4ML ~~LOC~~ SOLN
40.0000 mg | SUBCUTANEOUS | Status: DC
  Administered 2019-07-05 – 2019-07-08 (×4): 40 mg via SUBCUTANEOUS
  Filled 2019-07-05 (×4): qty 0.4

## 2019-07-05 MED ORDER — SODIUM CHLORIDE 0.9% FLUSH: 3.0000 mL | INTRAVENOUS | Status: DC | PRN

## 2019-07-05 NOTE — H&P (Signed)
History and Physical    Larry Lewis PJA:[id number] DOB: 21-Aug-1965 DOA: 07/04/2019  Referring MD/NP/PA: Darreld Mclean, MD PCP: Patient, No Pcp Per  Patient coming from: Transfer from med [facility]  Chief Complaint: Shortness of breath  I have personally briefly reviewed patient's old medical records in [facility]   HPI: Larry Lewis is a 54 y.o. male with medical history significant of HTN, HLD, CAD, asthma, tobacco abuse, and anxiety presents with complaints of shortness of breath.  Patient reports symptoms have been progressively worsening over the last 4 months.  He had just recently returned from [place] 2 weeks ago.  Complained that he had not been able to sleep laying flat or even walk a few feet without becoming extremely short of breath.  Tried sticking his head in a fridge to see if the cold air would help, but it did not.  Associated symptoms include insomnia,  suprapubic pain, dysuria, urinary frequency, increased thirst.  Denies having any significant leg swelling or chest pain.  Since moving back he has not set up care with a primary provider and reports not being on any medication.   ED Course: Upon admission into the emergency department patient was seen to be afebrile, pulse 91-1 21, respiration 20-45, blood pressures maintained, and O2 saturations as low as 88% on room air with ambulation improved on 4 L of nasal cannula oxygen.  Labs significant for glucose 381, BNP 267.6, troponin 25->25.  CT angiogram of the chest was significant for moderate bilateral pleural effusions cardiomegaly with prominent left atrial/ventricular dilation, likely reactive mediastinal adenopathy, and cholelithiasis.  Patient had been given 40 mg of Lasix and 10 units of insulin.  Accepted to a cardiac telemetry bed at [facility].  Review of Systems  Constitutional: Positive for malaise/fatigue. Negative for fever.  HENT: Negative for congestion.   Respiratory: Positive for shortness of breath.  Negative for sputum production.   Cardiovascular: Positive for orthopnea. Negative for chest pain and leg swelling.  Gastrointestinal: Positive for abdominal pain. Negative for nausea.  Genitourinary: Positive for dysuria and frequency.  Musculoskeletal: Negative for falls.  Neurological: Negative for focal weakness and loss of consciousness.  Endo/Heme/Allergies: Positive for polydipsia.  Psychiatric/Behavioral: The patient has insomnia. The patient is not nervous/anxious.     Past Medical History:  Diagnosis Date  . AMI (acute myocardial infarction) (HCC)   . Anxiety   . Asthma   . Coronary artery disease   . High cholesterol   . Hypertension     Past Surgical History:  Procedure Laterality Date  . CARDIAC CATHETERIZATION       reports that he has been smoking cigarettes. He has been smoking about 1.00 pack per day. He has never used smokeless tobacco. He reports that he does not drink alcohol or use drugs.  Allergies  Allergen Reactions  . Penicillins Itching    No family history on file.  Prior to Admission medications   Not on File    Physical Exam:  Constitutional: Middle-age male who appears to be some respiratory discomfort Vitals:   07/05/19 1330 07/05/19 1332 07/05/19 1508 07/05/19 1521  BP:  126/86 (!) 130/96 (!) 130/96  Pulse: (!) 105 (!) 103    Resp: (!) 26 (!) 21  (!) 23  Temp:    98.3 F (36.8 C)  TempSrc:    Oral  SpO2: 94% 94%  93%  Weight:    80.9 kg  Height:    5' 6.93" (1.7 m)  Eyes: PERRL, lids and conjunctivae normal ENMT: Mucous membranes are moist. Posterior pharynx clear of any exudate or lesions.Normal dentition.  Neck: normal, supple, no masses, no thyromegaly Respiratory: Positive crackles throughout both lung fields.  Patient on Cardiovascular: Tachycardic.  No lower extremity edema.  Peripheral pulses intact. Abdomen: no tenderness, no masses palpated. No hepatosplenomegaly. Bowel sounds positive.  Musculoskeletal: no clubbing  / cyanosis. No joint deformity upper and lower extremities. Good ROM, no contractures. Normal muscle tone.  Skin: no rashes, lesions, ulcers. No induration Neurologic: CN 2-12 grossly intact. Sensation intact, DTR normal. Strength 5/5 in all 4.  Psychiatric: Normal judgment and insight. Alert and oriented x 3. Normal mood.     Labs on Admission: I have personally reviewed following labs and imaging studies  CBC: Recent Labs  Lab 07/04/19 1750  WBC 9.1  HGB 15.9  HCT 50.9  MCV 77.7*  PLT 096   Basic Metabolic Panel: Recent Labs  Lab 07/04/19 1750  NA 136  K 3.6  CL 100  CO2 25  GLUCOSE 381*  BUN 8  CREATININE 0.79  CALCIUM 9.4   GFR: Estimated Creatinine Clearance: 106.1 mL/min (by C-G formula based on SCr of 0.79 mg/dL). Liver Function Tests: No results for input(s): AST, ALT, ALKPHOS, BILITOT, PROT, ALBUMIN in the last 168 hours. No results for input(s): LIPASE, AMYLASE in the last 168 hours. No results for input(s): AMMONIA in the last 168 hours. Coagulation Profile: No results for input(s): INR, PROTIME in the last 168 hours. Cardiac Enzymes: No results for input(s): CKTOTAL, CKMB, CKMBINDEX, TROPONINI in the last 168 hours. BNP (last 3 results) No results for input(s): PROBNP in the last 8760 hours. HbA1C: No results for input(s): HGBA1C in the last 72 hours. CBG: Recent Labs  Lab 07/04/19 2335 07/05/19 0712  GLUCAP 97 282*   Lipid Profile: No results for input(s): CHOL, HDL, LDLCALC, TRIG, CHOLHDL, LDLDIRECT in the last 72 hours. Thyroid Function Tests: No results for input(s): TSH, T4TOTAL, FREET4, T3FREE, THYROIDAB in the last 72 hours. Anemia Panel: No results for input(s): VITAMINB12, FOLATE, FERRITIN, TIBC, IRON, RETICCTPCT in the last 72 hours. Urine analysis:    Component Value Date/Time   COLORURINE YELLOW 10/04/2017 2105   APPEARANCEUR CLEAR 10/04/2017 2105   LABSPEC 1.025 10/04/2017 2105   PHURINE 6.0 10/04/2017 2105   GLUCOSEU >=500  (A) 10/04/2017 2105   HGBUR TRACE (A) 10/04/2017 2105   BILIRUBINUR NEGATIVE 10/04/2017 2105   [place] NEGATIVE 10/04/2017 2105   PROTEINUR 100 (A) 10/04/2017 2105   NITRITE NEGATIVE 10/04/2017 2105   LEUKOCYTESUR NEGATIVE 10/04/2017 2105   Sepsis Labs: Recent Results (from the past 240 hour(s))  Respiratory Panel by RT PCR (Flu A&B, Covid) - Nasopharyngeal Swab     Status: None   Collection Time: 07/04/19  8:36 PM   Specimen: Nasopharyngeal Swab  Result Value Ref Range Status   SARS Coronavirus 2 by RT PCR NEGATIVE NEGATIVE Final    Comment: (NOTE) SARS-CoV-2 target nucleic acids are NOT DETECTED. The SARS-CoV-2 RNA is generally detectable in upper respiratoy specimens during the acute phase of infection. The lowest concentration of SARS-CoV-2 viral copies this assay can detect is 131 copies/mL. A negative result does not preclude SARS-Cov-2 infection and should not be used as the sole basis for treatment or other patient management decisions. A negative result may occur with  improper specimen collection/handling, submission of specimen other than nasopharyngeal swab, presence of viral mutation(s) within the areas targeted by this assay, and inadequate number of  viral copies (<131 copies/mL). A negative result must be combined with clinical observations, patient history, and epidemiological information. The expected result is Negative. Fact Sheet for Patients:  https://www.moore.com/ Fact Sheet for Healthcare Providers:  https://www.young.biz/ This test is not yet ap proved or cleared by the [place] FDA and  has been authorized for detection and/or diagnosis of SARS-CoV-2 by FDA under an Emergency Use Authorization (EUA). This EUA will remain  in effect (meaning this test can be used) for the duration of the COVID-19 declaration under Section 564(b)(1) of the Act, 21 U.S.C. section 360bbb-3(b)(1), unless the authorization is  terminated or revoked sooner.    Influenza A by PCR NEGATIVE NEGATIVE Final   Influenza B by PCR NEGATIVE NEGATIVE Final    Comment: (NOTE) The Xpert Xpress SARS-CoV-2/FLU/RSV assay is intended as an aid in  the diagnosis of influenza from Nasopharyngeal swab specimens and  should not be used as a sole basis for treatment. Nasal washings and  aspirates are unacceptable for Xpert Xpress SARS-CoV-2/FLU/RSV  testing. Fact Sheet for Patients: https://www.moore.com/ Fact Sheet for Healthcare Providers: https://www.young.biz/ This test is not yet approved or cleared by the [place] FDA and  has been authorized for detection and/or diagnosis of SARS-CoV-2 by  FDA under an Emergency Use Authorization (EUA). This EUA will remain  in effect (meaning this test can be used) for the duration of the  Covid-19 declaration under Section 564(b)(1) of the Act, 21  U.S.C. section 360bbb-3(b)(1), unless the authorization is  terminated or revoked. Performed at [facility], [address] [place], [place] [address]      Radiological Exams on Admission: CT Angio Chest PE W/Cm &/Or Wo Cm  Result Date: 07/04/2019 CLINICAL DATA:  Shortness of breath EXAM: CT ANGIOGRAPHY CHEST WITH CONTRAST TECHNIQUE: Multidetector CT imaging of the chest was performed using the standard protocol during bolus administration of intravenous contrast. Multiplanar CT image reconstructions and MIPs were obtained to evaluate the vascular anatomy. CONTRAST:  OMNIPAQUE IOHEXOL 350 MG/ML SOLN COMPARISON:  07/04/2019 FINDINGS: Cardiovascular: This is a technically adequate evaluation of the pulmonary vasculature. No filling defects or pulmonary emboli. There is prominent left ventricular and left atrial dilatation. No pericardial effusion. Mild atherosclerosis of the aorta and coronary vessels. No evidence of thoracic aortic aneurysm or dissection. Mediastinum/Nodes: Subcarinal  adenopathy measuring up to 14 mm in short axis. Borderline enlarged pretracheal nodes. These are nonspecific. Thyroid, trachea, and esophagus are unremarkable. Lungs/Pleura: There are moderate bilateral pleural effusions, estimated 1 L each. Areas of compressive atelectasis are seen within the dependent lower lobes. No airspace disease or pneumothorax. Central airways are patent. Upper Abdomen: Calcified gallstone is identified.  No cholecystitis. Musculoskeletal: No acute or destructive bony lesions. Reconstructed images demonstrate no additional findings. Review of the MIP images confirms the above findings. IMPRESSION: 1. No evidence of pulmonary embolus. 2. Moderate bilateral pleural effusions and dependent atelectasis. 3. Cardiomegaly, with prominent left atrial and left ventricular dilatation. 4. Likely reactive mediastinal adenopathy. 5.  Aortic Atherosclerosis (ICD10-I70.0). 6. Cholelithiasis. Electronically Signed   By: Sharlet Salina M.D.   On: 07/04/2019 21:58   DG Chest Port 1 View  Result Date: 07/04/2019 CLINICAL DATA:  Shortness of breath EXAM: PORTABLE CHEST 1 VIEW COMPARISON:  None. FINDINGS: There is cardiomegaly with pulmonary venous hypertension. There is diffuse interstitial thickening with suspected interstitial edema. There is atelectatic change in the medial left base with slight airspace opacity. No pleural effusions evident. No adenopathy. No bone lesions. IMPRESSION:  Cardiomegaly with pulmonary vascular congestion. There is interstitial thickening, likely due to interstitial edema. Medial left base atelectasis with suspect developing pneumonia or alveolar edema. Overall the appearance is concerning for a degree of congestive heart failure. There may well be superimposed pneumonia in the medial left base. Electronically Signed   By: Bretta Bang III M.D.   On: 07/04/2019 19:10    EKG: Independently reviewed.  Sinus tachycardia 123 bpm  Assessment/Plan Acute respiratory  failure with hypoxia secondary to congestive heart failure exacerbation with bilateral pleural effusion: Patient presented with complaints of progressive shortness of breath for last 4 months.  O2 saturation noted as low as 87% on room air.  BNP elevated 267.6.  Imaging studies significant for bilateral pleural effusions and cardiomegaly. -Admit to a telemetry bed -Heart failure orders set  initiated  -Continuous pulse oximetry with nasal cannula oxygen as needed to keep O2 saturations >92% -Strict I&Os and daily weights -Lasix 40 mg IV Bid -Reassess in a.m. and adjust diuresis as needed. -Check echocardiogram -Optimize medical management as able -Consider formally consulting cardiology in a.m. once echocardiogram complete  Uncontrolled diabetes mellitus type 2 with hyperglycemia: Acute.  Initial blood glucose elevated 381.  Patient not on any diabetic medications. -Hypoglycemic protocol -Check hemoglobin A1c -CBGs before every meal with moderate SSI -Diabetic education in a.m. -Adjust insulin regimen as needed  Elevated troponin: High-sensitivity troponin flat 25->25.  Suspect secondary to demand. -Follow-up telemetry overnight  Low MCV and MCH: Acute. -Check iron studies in a.m.  Tobacco abuse: Patient smokes 1 pack cigarettes per day on average -Continue to counsel on need of cessation of tobacco use -Nicotine patch offered  Lacks primary care provider  -Transitions of care consulted  DVT prophylaxis: Lovenox  Code Status: Full Family Communication: Wife updated over the phone by patient Disposition Plan: Possible discharge home in 2 to 3 days Consults called: None Admission status: Inpatient  Clydie Braun MD [facility] Pager [phone]   If 7PM-7AM, please contact night-coverage [email or web address] Password [email or web address]  07/05/2019, 3:51 PM

## 2019-07-06 ENCOUNTER — Inpatient Hospital Stay (HOSPITAL_COMMUNITY): Payer: Self-pay

## 2019-07-06 DIAGNOSIS — I5031 Acute diastolic (congestive) heart failure: Secondary | ICD-10-CM

## 2019-07-06 DIAGNOSIS — E876 Hypokalemia: Secondary | ICD-10-CM

## 2019-07-06 LAB — GLUCOSE, CAPILLARY
Glucose-Capillary: 334 mg/dL — ABNORMAL HIGH (ref 70–99)
Glucose-Capillary: 286 mg/dL — ABNORMAL HIGH (ref 70–99)
Glucose-Capillary: 253 mg/dL — ABNORMAL HIGH (ref 70–99)
Glucose-Capillary: 323 mg/dL — ABNORMAL HIGH (ref 70–99)

## 2019-07-06 LAB — IRON AND TIBC
Iron: 51 ug/dL (ref 45–182)
Saturation Ratios: 15 % — ABNORMAL LOW (ref 17.9–39.5)
TIBC: 342 ug/dL (ref 250–450)
UIBC: 291 ug/dL

## 2019-07-06 LAB — BASIC METABOLIC PANEL
Anion gap: 12 (ref 5–15)
BUN: 12 mg/dL (ref 6–20)
CO2: 27 mmol/L (ref 22–32)
Calcium: 8.6 mg/dL — ABNORMAL LOW (ref 8.9–10.3)
Chloride: 97 mmol/L — ABNORMAL LOW (ref 98–111)
Creatinine, Ser: 0.85 mg/dL (ref 0.61–1.24)
GFR calc Af Amer: >60 mL/min (ref >60)
GFR calc non Af Amer: >60 mL/min (ref >60)
Glucose, Bld: 332 mg/dL — ABNORMAL HIGH (ref 70–99)
Potassium: 3.1 mmol/L — ABNORMAL LOW (ref 3.5–5.1)
Sodium: 136 mmol/L (ref 135–145)

## 2019-07-06 LAB — FERRITIN: Ferritin: 377 ng/mL — ABNORMAL HIGH (ref 24–336)

## 2019-07-06 LAB — TSH: TSH: 0.527 u[IU]/mL (ref 0.350–4.500)

## 2019-07-06 LAB — ECHOCARDIOGRAM COMPLETE
Height: 66.929 in
Weight: 2841.6 oz

## 2019-07-06 LAB — MAGNESIUM: Magnesium: 1.8 mg/dL (ref 1.7–2.4)

## 2019-07-06 MED ORDER — PNEUMOCOCCAL VAC POLYVALENT 25 MCG/0.5ML IJ INJ: 0.5000 mL | INJECTION | INTRAMUSCULAR | Status: DC

## 2019-07-06 MED ORDER — INSULIN ASPART 100 UNIT/ML ~~LOC~~ SOLN
0.0000 [IU] | Freq: Three times a day (TID) | SUBCUTANEOUS | Status: DC
  Administered 2019-07-06 – 2019-07-07 (×3): 8 [IU] via SUBCUTANEOUS
  Administered 2019-07-07: 2 [IU] via SUBCUTANEOUS
  Administered 2019-07-07: 09:00:00 11 [IU] via SUBCUTANEOUS
  Administered 2019-07-08: 5 [IU] via SUBCUTANEOUS
  Administered 2019-07-08: 12:00:00 11 [IU] via SUBCUTANEOUS

## 2019-07-06 MED ORDER — ATORVASTATIN CALCIUM 40 MG PO TABS
40.0000 mg | ORAL_TABLET | Freq: Every day | ORAL | Status: DC
  Administered 2019-07-06 – 2019-07-07 (×2): 40 mg via ORAL
  Filled 2019-07-06 (×2): qty 1

## 2019-07-06 MED ORDER — INSULIN ASPART 100 UNIT/ML ~~LOC~~ SOLN
4.0000 [IU] | Freq: Three times a day (TID) | SUBCUTANEOUS | Status: DC
  Administered 2019-07-06: 4 [IU] via SUBCUTANEOUS

## 2019-07-06 MED ORDER — INSULIN STARTER KIT- PEN NEEDLES (ENGLISH)
1.0000 | Freq: Once | Status: AC
  Administered 2019-07-06: 16:00:00 1
  Filled 2019-07-06: qty 1

## 2019-07-06 MED ORDER — INSULIN GLARGINE 100 UNIT/ML ~~LOC~~ SOLN
6.0000 [IU] | Freq: Two times a day (BID) | SUBCUTANEOUS | Status: DC
  Administered 2019-07-06: 6 [IU] via SUBCUTANEOUS
  Filled 2019-07-06 (×2): qty 0.06

## 2019-07-06 MED ORDER — POTASSIUM CHLORIDE CRYS ER 20 MEQ PO TBCR
40.0000 meq | EXTENDED_RELEASE_TABLET | ORAL | Status: AC
  Administered 2019-07-06 (×2): 40 meq via ORAL
  Filled 2019-07-06 (×2): qty 2

## 2019-07-06 MED ORDER — INSULIN ASPART 100 UNIT/ML ~~LOC~~ SOLN
6.0000 [IU] | Freq: Three times a day (TID) | SUBCUTANEOUS | Status: DC
  Administered 2019-07-06: 6 [IU] via SUBCUTANEOUS

## 2019-07-06 MED ORDER — INSULIN GLARGINE 100 UNIT/ML ~~LOC~~ SOLN
8.0000 [IU] | Freq: Two times a day (BID) | SUBCUTANEOUS | Status: DC
  Administered 2019-07-06: 22:00:00 8 [IU] via SUBCUTANEOUS
  Filled 2019-07-06 (×3): qty 0.08

## 2019-07-06 MED ORDER — LIVING WELL WITH DIABETES BOOK
Freq: Once | Status: AC
  Filled 2019-07-06 (×2): qty 1

## 2019-07-06 MED ORDER — INSULIN ASPART 100 UNIT/ML ~~LOC~~ SOLN
0.0000 [IU] | Freq: Every day | SUBCUTANEOUS | Status: DC
  Administered 2019-07-06: 4 [IU] via SUBCUTANEOUS

## 2019-07-06 NOTE — Care Management (Signed)
Patient is eligible for [facility] program to fill insulin, etc. Lantus vials and Novolog pens/vials are covered under MATCH if discharged Monday-Saturday.  Only vials are available on Sunday.  Clinic resources are added to AVS.  If patient is here Monday, TOC can assist with getting appointment.

## 2019-07-06 NOTE — Progress Notes (Signed)
Inpatient Diabetes Program Recommendations  AACE/ADA: New Consensus Statement on Inpatient Glycemic Control (2015)  Target Ranges:  Prepandial:   less than 140 mg/dL      Peak postprandial:   less than 180 mg/dL (1-2 hours)      Critically ill patients:  140 - 180 mg/dL   Lab Results  Component Value Date   GLUCAP 286 (H) 07/06/2019   HGBA1C 12.2 (H) 07/05/2019    Review of Glycemic Control  Diabetes history: ? Prior hx DM Outpatient Diabetes medications: None Current orders for Inpatient glycemic control: Lantus 6 units bid + Novolog 4 units tid meal coverage if eats 50% + Novolog moderate correction tid + hs 0-5 units  Inpatient Diabetes Program Recommendations:   -May increase Lantus to 8 units bid (0.2 units/kg x 80.6 kg)  Ordered living well with diabetes, pen starter kit, dietician consult & Transition of care consult.  Thank you, Nani Gasser. Bane Hagy, RN, MSN, CDE  Diabetes Coordinator Inpatient Glycemic Control Team Team Pager [phone] (8am-5pm) 07/06/2019 11:32 AM

## 2019-07-06 NOTE — Progress Notes (Signed)
PROGRESS NOTE  Larry Lewis OAC:[id number] DOB: [birth year]   PCP: Patient, No Pcp Per.  Patient has no PCP.  Patient is from: Home.  DOA: 07/04/2019 LOS: 1  Brief Narrative / Interim history: 54 year old male with history of HTN, HLD, CAD, asthma, tobacco use disorder, right inguinal hernia and anxiety presenting with progressive shortness of breath, orthopnea, UTI symptoms and insomnia, and admitted with acute unknown type CHF.  In ED, 100-1 110s.  RR 20s to 30s.  Desaturated to 88% with ambulation on RA requiring 4 L to recover.  Glucose 332.  MCV 77.7.  Otherwise, CBC and BMP without significant finding.  BNP 267. HS Trop 25x2.  EKG with sinus tachycardia, J-point elevation in anterior leads and T wave changes in lateral leads. CXR with cardiomegaly and pulmonary vascular congestion.  COVID-19 PCR and influenza PCR negative.  CTA chest negative for PE but moderate bilateral pleural effusions, dependent atelectasis, cardiomegaly with prominent LA and LV dilation, mediastinal adenopathy and cholelithiasis.  Started on IV Lasix and 10 units of insulin and admitted for CHF exacerbation.  Continued on IV Lasix 40 mg twice daily.  Echocardiogram ordered  Subjective: Seen and examined earlier this morning.  No major events overnight of this morning.  Reports chest congestion but no pain.  Breathing improved.  Reports good urine output.  Denies nausea, vomiting, abdominal pain or UTI symptoms.  Feels hungry.  Objective: Vitals:   07/05/19 1508 07/05/19 1521 07/05/19 2010 07/06/19 0447  BP: (!) 130/96 (!) 130/96 115/75 128/85  Pulse:   (!) 108 100  Resp:  (!) _0 Temp:  98.3 F (36.8 C) 99.3 F (37.4 C) 98.1 F (36.7 C)  TempSrc:  Oral Oral Oral  SpO2:  93% 93% 93%  Weight:  80.9 kg  80.6 kg  Height:  5' 6.93" (1.7 m)      Intake/Output Summary (Last 24 hours) at 07/06/2019 1500 Last data filed at 07/06/2019 0817 Gross per 24 hour  Intake 726 ml  Output 1610 ml  Net -884 ml    Filed Weights   07/04/19 1803 07/05/19 1521 07/06/19 0447  Weight: 80.7 kg 80.9 kg 80.6 kg    Examination:  GENERAL: No apparent distress. Nontoxic.  HEENT: MMM.  Vision and hearing grossly intact.  NECK: Supple.  No apparent JVD.  RESP: 95% on RA.  No IWOB.  Bibasilar crackles with rhonchi bilaterally. CVS:  RRR. Heart sounds normal.  ABD/GI/GU: BS present. Soft. Non tender.  MSK/EXT:  Moves extremities. No apparent deformity. No edema.  SKIN: no apparent skin lesion or wound NEURO: Awake, alert and oriented appropriately.  No apparent focal neuro deficit. PSYCH: Calm. Normal affect.  Procedures:  None  Microbiology summarized: COVID-19 PCR negative. Influenza A/B PCR negative.  Assessment & Plan: Acute respiratory failure with hypoxia due to unknown type and acute congestive heart failure-no previous echo in chart.  Presented with dyspnea and orthopnea.  CXR, CTA and BNP consistent with acute CHF. Responding to IV Lasix.  About 1.6 L UOP in the last 24 hours.  Renal function is stable. -Wean oxygen to room air-maintain 95% without distress during my exam. -Continue IV Lasix 40 mg twice daily -Monitor fluid status, renal function and electrolytes. -Follow echocardiogram -Cardiology consult based on echo finding.  Elevated troponin: Likely demand ischemia due to acute CHF.  EKG with sinus tachycardia, J-point elevation in anterior leads and nonspecific T wave changes in lateral leads.  Currently without chest pain. -Monitor CHF as above -Follow  echocardiogram as above  Moderate bilateral pleural effusion: Likely due to CHF. -Continue IV Lasix -Repeat CXR after adequate diuresis  Mediastinal lymphadenopathy: Likely reactive from CHF -Manage as above  Uncontrolled DM-2 with hyperglycemia: A1c 12.2%.  Not on medication at home. Recent Labs    07/05/19 2129 07/06/19 0737 07/06/19 1122  GLUCAP 258* 334* 286*  -Added Lantus 8 units twice daily, NovoLog 6 units  AC -Continue SSI-moderate -Start atorvastatin 40 mg daily. -Check lipid panel in am -Appreciate diabetic coordinator help.   Essential hypertension?  Normotensive.  Not on medication at home. -IV Lasix as above  Tobacco use disorder -Encourage cessation. -Nicotine patch.  Hypokalemia: Likely due to diuretics.  Magnesium was normal. -Replenish and recheck.                  DVT prophylaxis: Subcu Lovenox Code Status: Full code Family Communication: Patient and/or RN. Available if any question.   Discharge barrier: Evaluation and management of acute CHF requiring IV diuretics Patient is from: Home. Final disposition: Likely home once CHF evaluation completed and adequately diuresed.  Consultants:  None   Sch Meds:  Scheduled Meds: . enoxaparin (LOVENOX) injection  40 mg Subcutaneous Q24H  . furosemide  40 mg Intravenous BID  . insulin aspart  0-15 Units Subcutaneous TID WC  . insulin aspart  0-5 Units Subcutaneous QHS  . insulin aspart  4 Units Subcutaneous TID WC  . insulin glargine  6 Units Subcutaneous BID  . insulin starter kit- pen needles  1 kit Other Once  . living well with diabetes book   Does not apply Once  . nicotine  21 mg Transdermal Daily  . potassium chloride  40 mEq Oral Q4H  . sodium chloride flush  3 mL Intravenous Q12H   Continuous Infusions: . sodium chloride     PRN Meds:.sodium chloride, acetaminophen, nitroGLYCERIN, ondansetron (ZOFRAN) IV, sodium chloride flush  Antimicrobials: Anti-infectives (From admission, onward)   None       I have personally reviewed the following labs and images: CBC: Recent Labs  Lab 07/04/19 1750  WBC 9.1  HGB 15.9  HCT 50.9  MCV 77.7*  PLT 312   BMP &GFR Recent Labs  Lab 07/04/19 1750 07/06/19 0538  NA 136 136  K 3.6 3.1*  CL 100 97*  CO2 25 27  GLUCOSE 381* 332*  BUN 8 12  CREATININE 0.79 0.85  CALCIUM 9.4 8.6*  MG  --  1.8   Estimated Creatinine Clearance: 99.7 mL/min (by  C-G formula based on SCr of 0.85 mg/dL). Liver & Pancreas: No results for input(s): AST, ALT, ALKPHOS, BILITOT, PROT, ALBUMIN in the last 168 hours. No results for input(s): LIPASE, AMYLASE in the last 168 hours. No results for input(s): AMMONIA in the last 168 hours. Diabetic: Recent Labs    07/05/19 1622  HGBA1C 12.2*   Recent Labs  Lab 07/05/19 1554 07/05/19 1725 07/05/19 2129 07/06/19 0737 07/06/19 1122  GLUCAP 449* 376* 258* 334* 286*   Cardiac Enzymes: No results for input(s): CKTOTAL, CKMB, CKMBINDEX, TROPONINI in the last 168 hours. No results for input(s): PROBNP in the last 8760 hours. Coagulation Profile: No results for input(s): INR, PROTIME in the last 168 hours. Thyroid Function Tests: Recent Labs    07/06/19 0538  TSH 0.527   Lipid Profile: No results for input(s): CHOL, HDL, LDLCALC, TRIG, CHOLHDL, LDLDIRECT in the last 72 hours. Anemia Panel: Recent Labs    07/06/19 0538  FERRITIN 377*  TIBC 342  IRON 51   Urine analysis:    Component Value Date/Time   COLORURINE COLORLESS (A) 07/05/2019 1749   APPEARANCEUR CLEAR 07/05/2019 1749   LABSPEC 1.002 (L) 07/05/2019 1749   PHURINE 7.0 07/05/2019 1749   GLUCOSEU >=500 (A) 07/05/2019 1749   HGBUR NEGATIVE 07/05/2019 1749   BILIRUBINUR NEGATIVE 07/05/2019 1749   KETONESUR NEGATIVE 07/05/2019 1749   PROTEINUR NEGATIVE 07/05/2019 1749   NITRITE NEGATIVE 07/05/2019 1749   LEUKOCYTESUR NEGATIVE 07/05/2019 1749   Sepsis Labs: Invalid input(s): PROCALCITONIN, Jaconita  Microbiology: Recent Results (from the past 240 hour(s))  Respiratory Panel by RT PCR (Flu A&B, Covid) - Nasopharyngeal Swab     Status: None   Collection Time: 07/04/19  8:36 PM   Specimen: Nasopharyngeal Swab  Result Value Ref Range Status   SARS Coronavirus 2 by RT PCR NEGATIVE NEGATIVE Final    Comment: (NOTE) SARS-CoV-2 target nucleic acids are NOT DETECTED. The SARS-CoV-2 RNA is generally detectable in upper  respiratoy specimens during the acute phase of infection. The lowest concentration of SARS-CoV-2 viral copies this assay can detect is 131 copies/mL. A negative result does not preclude SARS-Cov-2 infection and should not be used as the sole basis for treatment or other patient management decisions. A negative result may occur with  improper specimen collection/handling, submission of specimen other than nasopharyngeal swab, presence of viral mutation(s) within the areas targeted by this assay, and inadequate number of viral copies (<131 copies/mL). A negative result must be combined with clinical observations, patient history, and epidemiological information. The expected result is Negative. Fact Sheet for Patients:  PinkCheek.be Fact Sheet for Healthcare Providers:  GravelBags.it This test is not yet ap proved or cleared by the [place] FDA and  has been authorized for detection and/or diagnosis of SARS-CoV-2 by FDA under an Emergency Use Authorization (EUA). This EUA will remain  in effect (meaning this test can be used) for the duration of the COVID-19 declaration under Section 564(b)(1) of the Act, 21 U.S.C. section 360bbb-3(b)(1), unless the authorization is terminated or revoked sooner.    Influenza A by PCR NEGATIVE NEGATIVE Final   Influenza B by PCR NEGATIVE NEGATIVE Final    Comment: (NOTE) The Xpert Xpress SARS-CoV-2/FLU/RSV assay is intended as an aid in  the diagnosis of influenza from Nasopharyngeal swab specimens and  should not be used as a sole basis for treatment. Nasal washings and  aspirates are unacceptable for Xpert Xpress SARS-CoV-2/FLU/RSV  testing. Fact Sheet for Patients: PinkCheek.be Fact Sheet for Healthcare Providers: GravelBags.it This test is not yet approved or cleared by the [place] FDA and  has been authorized for  detection and/or diagnosis of SARS-CoV-2 by  FDA under an Emergency Use Authorization (EUA). This EUA will remain  in effect (meaning this test can be used) for the duration of the  Covid-19 declaration under Section 564(b)(1) of the Act, 21  U.S.C. section 360bbb-3(b)(1), unless the authorization is  terminated or revoked. Performed at [facility], [address] [place], [place] [address]     Radiology Studies: US PELVIS LIMITED (TRANSABDOMINAL ONLY)  Result Date: 07/05/2019 CLINICAL DATA:  Bulge right inguinal region EXAM: LIMITED ULTRASOUND OF PELVIS TECHNIQUE: Limited transabdominal ultrasound examination of the pelvis was performed. COMPARISON:  10/04/2017 FINDINGS: Sonographic evaluation of the right inguinal region was performed. A fat containing right inguinal hernia is identified. On previous CT, a portion of the bladder dome as well as the appendix extended into the right inguinal hernia. On today's  exam, there is equivocal finding of bowel herniation. No free fluid. Normal appearing right inguinal lymph nodes are seen. IMPRESSION: 1. 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 Suspect bowel herniation on today's study. No fluid or inflammatory change. Electronically Signed   By: Randa Ngo M.D.   On: 07/05/2019 22:13     Deanglo Hissong [address] [place]  If 7PM-7AM, please contact night-coverage [email or web address] Password [email or web address] 07/06/2019, 3:00 PM

## 2019-07-06 NOTE — Progress Notes (Signed)
  Echocardiogram 2D Echocardiogram has been performed.  Larry Lewis F 07/06/2019, 2:48 PM

## 2019-07-07 DIAGNOSIS — K4090 Unilateral inguinal hernia, without obstruction or gangrene, not specified as recurrent: Secondary | ICD-10-CM

## 2019-07-07 DIAGNOSIS — J9601 Acute respiratory failure with hypoxia: Secondary | ICD-10-CM

## 2019-07-07 DIAGNOSIS — I25708 Atherosclerosis of coronary artery bypass graft(s), unspecified, with other forms of angina pectoris: Secondary | ICD-10-CM

## 2019-07-07 DIAGNOSIS — Z720 Tobacco use: Secondary | ICD-10-CM

## 2019-07-07 DIAGNOSIS — I5043 Acute on chronic combined systolic (congestive) and diastolic (congestive) heart failure: Secondary | ICD-10-CM

## 2019-07-07 DIAGNOSIS — M6208 Separation of muscle (nontraumatic), other site: Secondary | ICD-10-CM

## 2019-07-07 DIAGNOSIS — E785 Hyperlipidemia, unspecified: Secondary | ICD-10-CM

## 2019-07-07 LAB — LIPID PANEL
Cholesterol: 274 mg/dL — ABNORMAL HIGH (ref 0–200)
HDL: 34 mg/dL — ABNORMAL LOW (ref >40)
LDL Cholesterol: 211 mg/dL — ABNORMAL HIGH (ref 0–99)
Total CHOL/HDL Ratio: 8.1 RATIO
Triglycerides: 145 mg/dL (ref <150)
VLDL: 29 mg/dL (ref 0–40)

## 2019-07-07 LAB — RENAL FUNCTION PANEL
Albumin: 3.3 g/dL — ABNORMAL LOW (ref 3.5–5.0)
Anion gap: 11 (ref 5–15)
BUN: 14 mg/dL (ref 6–20)
CO2: 25 mmol/L (ref 22–32)
Calcium: 9.1 mg/dL (ref 8.9–10.3)
Chloride: 99 mmol/L (ref 98–111)
Creatinine, Ser: 0.82 mg/dL (ref 0.61–1.24)
GFR calc Af Amer: >60 mL/min (ref >60)
GFR calc non Af Amer: >60 mL/min (ref >60)
Glucose, Bld: 249 mg/dL — ABNORMAL HIGH (ref 70–99)
Phosphorus: 4.7 mg/dL — ABNORMAL HIGH (ref 2.5–4.6)
Potassium: 3.4 mmol/L — ABNORMAL LOW (ref 3.5–5.1)
Sodium: 135 mmol/L (ref 135–145)

## 2019-07-07 LAB — GLUCOSE, CAPILLARY
Glucose-Capillary: 344 mg/dL — ABNORMAL HIGH (ref 70–99)
Glucose-Capillary: 132 mg/dL — ABNORMAL HIGH (ref 70–99)
Glucose-Capillary: 271 mg/dL — ABNORMAL HIGH (ref 70–99)
Glucose-Capillary: 191 mg/dL — ABNORMAL HIGH (ref 70–99)

## 2019-07-07 LAB — MAGNESIUM: Magnesium: 2 mg/dL (ref 1.7–2.4)

## 2019-07-07 MED ORDER — INSULIN GLARGINE 100 UNIT/ML ~~LOC~~ SOLN
15.0000 [IU] | Freq: Two times a day (BID) | SUBCUTANEOUS | Status: DC
  Administered 2019-07-07 – 2019-07-09 (×5): 15 [IU] via SUBCUTANEOUS
  Filled 2019-07-07 (×6): qty 0.15

## 2019-07-07 MED ORDER — ASPIRIN 81 MG PO CHEW
81.0000 mg | CHEWABLE_TABLET | Freq: Every day | ORAL | Status: DC
  Administered 2019-07-07 – 2019-07-08 (×2): 81 mg via ORAL
  Filled 2019-07-07 (×2): qty 1

## 2019-07-07 MED ORDER — POTASSIUM CHLORIDE CRYS ER 20 MEQ PO TBCR
40.0000 meq | EXTENDED_RELEASE_TABLET | ORAL | Status: AC
  Administered 2019-07-07 (×2): 40 meq via ORAL
  Filled 2019-07-07 (×2): qty 2

## 2019-07-07 MED ORDER — ATORVASTATIN CALCIUM 80 MG PO TABS
80.0000 mg | ORAL_TABLET | Freq: Every day | ORAL | Status: DC
  Administered 2019-07-08 – 2019-07-11 (×4): 80 mg via ORAL
  Filled 2019-07-07 (×4): qty 1

## 2019-07-07 MED ORDER — INSULIN ASPART 100 UNIT/ML ~~LOC~~ SOLN
8.0000 [IU] | Freq: Three times a day (TID) | SUBCUTANEOUS | Status: DC
  Administered 2019-07-07 – 2019-07-11 (×13): 8 [IU] via SUBCUTANEOUS

## 2019-07-07 NOTE — Progress Notes (Addendum)
PROGRESS NOTE  Larry Lewis KGY:[id number] DOB: 03/09/1966   PCP: Patient, No Pcp Per.  Patient has no PCP.  Patient is from: Home.  DOA: 07/04/2019 LOS: 2  Brief Narrative / Interim history: 54 year old male with history of HTN, HLD, CAD, asthma, tobacco use disorder, right inguinal hernia and anxiety presenting with progressive shortness of breath, orthopnea, UTI symptoms and insomnia, and admitted with acute unknown type CHF.  In ED, 100-1 110s.  RR 20s to 30s.  Desaturated to 88% with ambulation on RA requiring 4 L to recover.  Glucose 332.  MCV 77.7.  Otherwise, CBC and BMP without significant finding.  BNP 267. HS Trop 25x2.  EKG with sinus tachycardia, J-point elevation in anterior leads and T wave changes in lateral leads. CXR with cardiomegaly and pulmonary vascular congestion.  COVID-19 PCR and influenza PCR negative.  CTA chest negative for PE but moderate bilateral pleural effusions, dependent atelectasis, cardiomegaly with prominent LA and LV dilation, mediastinal adenopathy and cholelithiasis.  Started on IV Lasix and 10 units of insulin and admitted for CHF exacerbation.  Continued on IV Lasix 40 mg twice daily.    TTE with EF of 35 to 40%, RWMA, indeterminate diastolic functions, moderate LAE with moderate aortic valve sclerosis/calcification and indeterminate cuspid structures.  Cardiology consulted on 4/18.  Subjective: Seen and examined earlier this morning.  No major events overnight of this morning.  No complaints this morning.  He denies chest pain, dyspnea, dizziness, palpitation, GI or UTI symptoms.  Good response to IV Lasix.  1.8 L UOP during the day yesterday.  None charted overnight.  -4 L since admit.  Renal function is stable.  Objective: Vitals:   07/05/19 2010 07/06/19 0447 07/06/19 2131 07/07/19 0511  BP: 115/75 128/85 128/68 124/68  Pulse: (!) 108 100 (!) 112 97  Resp: 20 20 20 20   Temp: 99.3 F (37.4 C) 98.1 F (36.7 C) 98.2 F (36.8 C) 98.2 F (36.8 C)    TempSrc: Oral Oral Oral Oral  SpO2: 93% 93% 94% 94%  Weight:  80.6 kg  81 kg  Height:        Intake/Output Summary (Last 24 hours) at 07/07/2019 1345 Last data filed at 07/07/2019 0600 Gross per 24 hour  Intake 480 ml  Output 1280 ml  Net -800 ml   Filed Weights   07/05/19 1521 07/06/19 0447 07/07/19 0511  Weight: 80.9 kg 80.6 kg 81 kg    Examination:  GENERAL: No apparent distress.  Nontoxic.  Lying flat in bed without distress. HEENT: MMM.  Vision and hearing grossly intact.  NECK: Supple.  No apparent JVD.  RESP: On room air.  No IWOB.  Fair aeration bilaterally.  Lying flat CVS:  RRR. Heart sounds normal.  ABD/GI/GU: Bowel sounds present. Soft. Non tender.  Right inguinal hernia and rectus diastasis MSK/EXT:  Moves extremities. No apparent deformity. No edema.  SKIN: no apparent skin lesion or wound NEURO: Awake, alert and oriented appropriately.  No apparent focal neuro deficit. PSYCH: Calm. Normal affect.  Procedures:  None  Microbiology summarized: COVID-19 PCR negative. Influenza A/B PCR negative.  Assessment & Plan: Acute respiratory failure with hypoxia due to unknown type and acute systolic CHF: TTE with EF of 35 to 40%, RWMA, indeterminate diastolic functions, moderate LAE with moderate aortic valve sclerosis/calcification and indeterminate cuspid structures.  No previous echo in chart. CXR, CTA and BNP consistent with acute CHF.  Responding to IV Lasix.  1.8 L UOP in the last 24 hours.  None charted overnight.  -4 L since admit.  Renal function is stable. -Cardiology consulted -Continue IV Lasix 40 mg twice daily -Monitor fluid status, renal function and electrolytes.  Elevated troponin: Likely demand ischemia due to acute CHF.  EKG with sinus tachycardia, J-point elevation in anterior leads and nonspecific T wave changes in lateral leads.  Currently without chest pain.  TTE as above. -Cardiology consulted. -Manage CHF as above.  Moderate bilateral pleural  effusion: Likely due to CHF. -Continue IV Lasix -Repeat CXR after adequate diuresis  Mediastinal lymphadenopathy: Likely reactive from CHF -Manage as above  Uncontrolled DM-2 with hyperglycemia: A1c 12.2%.  Not on medication at home. Recent Labs    07/06/19 2116 07/07/19 0802 07/07/19 1206  GLUCAP 323* 344* 132*  -Increase Lantus from 8 to 15 units twice daily.   -Increase NovoLog from 6 to 8 units AC. -Continue SSI-moderate -Continue atorvastatin 40 mg daily. -Appreciate diabetic coordinator help.   Essential hypertension?  Normotensive.  Not on medication at home. -IV Lasix as above  Tobacco use disorder -Encourage cessation. -Nicotine patch.  Hypokalemia: Likely due to diuretics.  Magnesium was normal. -Replenish and recheck.  Hyperlipidemia: LDL 211. -Started atorvastatin 40 mg daily.  Chronic right inguinal hernia/rectus diastases-no incarceration. -Continue monitoring                DVT prophylaxis: Subcu Lovenox Code Status: Full code Family Communication: Patient and/or RN.  Patient has no immediate family here.  Discharge barrier: Evaluation and management of acute CHF requiring IV diuretics Patient is from: Home. Final disposition: Likely home once CHF evaluation completed and adequately diuresed.  Consultants:  Cardiology   Sch Meds:  Scheduled Meds:  atorvastatin  40 mg Oral Daily   enoxaparin (LOVENOX) injection  40 mg Subcutaneous Q24H   furosemide  40 mg Intravenous BID   insulin aspart  0-15 Units Subcutaneous TID WC   insulin aspart  0-5 Units Subcutaneous QHS   insulin aspart  8 Units Subcutaneous TID WC   insulin glargine  15 Units Subcutaneous BID   nicotine  21 mg Transdermal Daily   pneumococcal 23 valent vaccine  0.5 mL Intramuscular Tomorrow-1000   sodium chloride flush  3 mL Intravenous Q12H   Continuous Infusions:  sodium chloride     PRN Meds:.sodium chloride, acetaminophen, nitroGLYCERIN, ondansetron  (ZOFRAN) IV, sodium chloride flush  Antimicrobials: Anti-infectives (From admission, onward)   None       I have personally reviewed the following labs and images: CBC: Recent Labs  Lab 07/04/19 1750  WBC 9.1  HGB 15.9  HCT 50.9  MCV 77.7*  PLT 312   BMP &GFR Recent Labs  Lab 07/04/19 1750 07/06/19 0538 07/07/19 0336  NA 136 136 135  K 3.6 3.1* 3.4*  CL 100 97* 99  CO2 GLUCOSE 381* 332* 249*  BUN CREATININE 0.79 0.85 0.82  CALCIUM 9.4 8.6* 9.1  MG  --  1.8 2.0  PHOS  --   --  4.7*   Estimated Creatinine Clearance: 103.5 mL/min (by C-G formula based on SCr of 0.82 mg/dL). Liver & Pancreas: Recent Labs  Lab 07/07/19 0336  ALBUMIN 3.3*   No results for input(s): LIPASE, AMYLASE in the last 168 hours. No results for input(s): AMMONIA in the last 168 hours. Diabetic: Recent Labs    07/05/19 1622  HGBA1C 12.2*   Recent Labs  Lab 07/06/19 1122 07/06/19 1720 07/06/19 2116 07/07/19 0802 07/07/19 1206  GLUCAP 286*  253* 323* 344* 132*   Cardiac Enzymes: No results for input(s): CKTOTAL, CKMB, CKMBINDEX, TROPONINI in the last 168 hours. No results for input(s): PROBNP in the last 8760 hours. Coagulation Profile: No results for input(s): INR, PROTIME in the last 168 hours. Thyroid Function Tests: Recent Labs    07/06/19 0538  TSH 0.527   Lipid Profile: Recent Labs    07/07/19 0336  CHOL 274*  HDL 34*  LDLCALC 211*  TRIG 145  CHOLHDL 8.1   Anemia Panel: Recent Labs    07/06/19 0538  FERRITIN 377*  TIBC 342  IRON 51   Urine analysis:    Component Value Date/Time   COLORURINE COLORLESS (A) 07/05/2019 1749   APPEARANCEUR CLEAR 07/05/2019 1749   LABSPEC 1.002 (L) 07/05/2019 1749   PHURINE 7.0 07/05/2019 1749   GLUCOSEU >=500 (A) 07/05/2019 1749   HGBUR NEGATIVE 07/05/2019 1749   BILIRUBINUR NEGATIVE 07/05/2019 1749   KETONESUR NEGATIVE 07/05/2019 1749   PROTEINUR NEGATIVE 07/05/2019 1749   NITRITE NEGATIVE  07/05/2019 1749   LEUKOCYTESUR NEGATIVE 07/05/2019 1749   Sepsis Labs: Invalid input(s): PROCALCITONIN, LACTICIDVEN  Microbiology: Recent Results (from the past 240 hour(s))  Respiratory Panel by RT PCR (Flu A&B, Covid) - Nasopharyngeal Swab     Status: None   Collection Time: 07/04/19  8:36 PM   Specimen: Nasopharyngeal Swab  Result Value Ref Range Status   SARS Coronavirus 2 by RT PCR NEGATIVE NEGATIVE Final    Comment: (NOTE) SARS-CoV-2 target nucleic acids are NOT DETECTED. The SARS-CoV-2 RNA is generally detectable in upper respiratoy specimens during the acute phase of infection. The lowest concentration of SARS-CoV-2 viral copies this assay can detect is 131 copies/mL. A negative result does not preclude SARS-Cov-2 infection and should not be used as the sole basis for treatment or other patient management decisions. A negative result may occur with  improper specimen collection/handling, submission of specimen other than nasopharyngeal swab, presence of viral mutation(s) within the areas targeted by this assay, and inadequate number of viral copies (<131 copies/mL). A negative result must be combined with clinical observations, patient history, and epidemiological information. The expected result is Negative. Fact Sheet for Patients:  https://www.moore.com/https://www.fda.gov/media/142436/download Fact Sheet for Healthcare Providers:  https://www.young.biz/https://www.fda.gov/media/142435/download This test is not yet ap proved or cleared by the [place] FDA and  has been authorized for detection and/or diagnosis of SARS-CoV-2 by FDA under an Emergency Use Authorization (EUA). This EUA will remain  in effect (meaning this test can be used) for the duration of the COVID-19 declaration under Section 564(b)(1) of the Act, 21 U.S.C. section 360bbb-3(b)(1), unless the authorization is terminated or revoked sooner.    Influenza A by PCR NEGATIVE NEGATIVE Final   Influenza B by PCR NEGATIVE NEGATIVE Final     Comment: (NOTE) The Xpert Xpress SARS-CoV-2/FLU/RSV assay is intended as an aid in  the diagnosis of influenza from Nasopharyngeal swab specimens and  should not be used as a sole basis for treatment. Nasal washings and  aspirates are unacceptable for Xpert Xpress SARS-CoV-2/FLU/RSV  testing. Fact Sheet for Patients: https://www.moore.com/https://www.fda.gov/media/142436/download Fact Sheet for Healthcare Providers: https://www.young.biz/https://www.fda.gov/media/142435/download This test is not yet approved or cleared by the [place] FDA and  has been authorized for detection and/or diagnosis of SARS-CoV-2 by  FDA under an Emergency Use Authorization (EUA). This EUA will remain  in effect (meaning this test can be used) for the duration of the  Covid-19 declaration under Section 564(b)(1) of the Act, 21  U.S.C. section 360bbb-3(b)(1), unless  the authorization is  terminated or revoked. Performed at [facility], [address] [place], [place] [place] [employer] [address]     Radiology Studies: ECHOCARDIOGRAM COMPLETE  Result Date: 07/06/2019    ECHOCARDIOGRAM REPORT   Patient Name:   Larry Lewis Date of Exam: 07/06/2019 Medical Rec #:  [id number]   Height:       66.9 in Accession #:    [id number]  Weight:       177.6 lb Date of Birth:  01-28-1966    BSA:          1.921 m Patient Age:    66 years    BP:           128/85 mmHg Patient Gender: M           HR:           97 bpm. Exam Location:  Inpatient Procedure: 2D Echo, Cardiac Doppler and Color Doppler Indications:    CHF-Acute Diastolic 539.76 B34.19  History:        Patient has no prior history of Echocardiogram examinations.                 Previous Myocardial Infarction and CAD.  Sonographer:    Merrie Roof RDCS Referring Phys: [id number] [place]  1. Left ventricular ejection fraction, by estimation, is 35 to 40%. The left ventricle has moderately decreased function. The left ventricle demonstrates regional wall motion abnormalities (see scoring diagram/findings for  description). Left ventricular  diastolic parameters are indeterminate.  2. Right ventricular systolic function is normal. The right ventricular size is normal.  3. Left atrial size was moderately dilated.  4. The mitral valve is normal in structure. Mild mitral valve regurgitation. No evidence of mitral stenosis.  5. Indeterminate cusp structure. Given age and amount of calcification, high suspicion for bicuspid valve, either functional or congenital.. The aortic valve has an indeterminant number of cusps. Aortic valve regurgitation is not visualized. Mild to moderate aortic valve sclerosis/calcification is present, without any evidence of aortic stenosis.  6. The inferior vena cava is normal in size with greater than 50% respiratory variability, suggesting right atrial pressure of 3 mmHg. Comparison(s): No prior Echocardiogram. Conclusion(s)/Recommendation(s): Different focal wall motion abnormalities in multiple coronary territories. Nearly global abnormalities with preserved apex, but does not look like takostubo pattern. FINDINGS  Left Ventricle: Left ventricular ejection fraction, by estimation, is 35 to 40%. The left ventricle has moderately decreased function. The left ventricle demonstrates regional wall motion abnormalities. The left ventricular internal cavity size was normal in size. There is no left ventricular hypertrophy. Left ventricular diastolic parameters are indeterminate.  LV Wall Scoring: The anterior wall, antero-lateral wall, anterior septum, entire inferior wall, posterior wall, mid inferoseptal segment, and basal inferoseptal segment are hypokinetic. The apical lateral segment, apical septal segment, apical anterior segment, and apex are normal. Right Ventricle: The right ventricular size is normal. No increase in right ventricular wall thickness. Right ventricular systolic function is normal. Left Atrium: Left atrial size was moderately dilated. Right Atrium: Right atrial size was normal  in size. Pericardium: There is no evidence of pericardial effusion. Mitral Valve: The mitral valve is normal in structure. Mild mitral valve regurgitation. No evidence of mitral valve stenosis. Tricuspid Valve: The tricuspid valve is normal in structure. Tricuspid valve regurgitation is trivial. No evidence of tricuspid stenosis. Aortic Valve: Indeterminate cusp structure. Given age and amount of calcification, high suspicion for bicuspid valve, either functional or congenital. The aortic  valve has an indeterminant number of cusps. Aortic valve regurgitation is not visualized. Mild to moderate aortic valve sclerosis/calcification is present, without any evidence of aortic stenosis. There is mild calcification of the aortic valve. Aortic valve mean gradient measures 10.0 mmHg. Aortic valve peak gradient measures 16.9 mmHg. Aortic valve area, by VTI measures 0.99 cm. Pulmonic Valve: The pulmonic valve was grossly normal. Pulmonic valve regurgitation is not visualized. No evidence of pulmonic stenosis. Aorta: The aortic root and ascending aorta are structurally normal, with no evidence of dilitation. Pulmonary Artery: The pulmonary artery is not well seen. Venous: The inferior vena cava is normal in size with greater than 50% respiratory variability, suggesting right atrial pressure of 3 mmHg. IAS/Shunts: The atrial septum is grossly normal.  LEFT VENTRICLE PLAX 2D LVIDd:         4.60 cm      Diastology LVIDs:         3.90 cm      LV e' lateral:   9.57 cm/s LV PW:         1.10 cm      LV E/e' lateral: 8.3 LV IVS:        1.10 cm      LV e' medial:    7.51 cm/s LVOT diam:     2.20 cm      LV E/e' medial:  10.6 LV SV:         33 LV SV Index:   17 LVOT Area:     3.80 cm                              3D Volume EF: LV Volumes (MOD)            3D EF:        47 % LV vol d, MOD A2C: 121.0 ml LV EDV:       158 ml LV vol d, MOD A4C: 127.0 ml LV ESV:       84 ml LV vol s, MOD A2C: 78.2 ml  LV SV:        74 ml LV vol s, MOD A4C:  71.1 ml LV SV MOD A2C:     42.8 ml LV SV MOD A4C:     127.0 ml LV SV MOD BP:      50.5 ml RIGHT VENTRICLE            IVC RV Basal diam:  3.30 cm    IVC diam: 1.50 cm RV S prime:     9.79 cm/s TAPSE (M-mode): 2.2 cm LEFT ATRIUM           Index       RIGHT ATRIUM           Index LA diam:      4.40 cm 2.29 cm/m  RA Area:     15.10 cm LA Vol (A2C): 95.0 ml 49.45 ml/m RA Volume:   37.10 ml  19.31 ml/m  AORTIC VALVE AV Area (Vmax):    1.05 cm AV Area (Vmean):   1.02 cm AV Area (VTI):     0.99 cm AV Vmax:           205.50 cm/s AV Vmean:          151.000 cm/s AV VTI:            0.334 m AV Peak Grad:      16.9 mmHg AV Mean  Grad:      10.0 mmHg LVOT Vmax:         56.80 cm/s LVOT Vmean:        40.700 cm/s LVOT VTI:          0.087 m LVOT/AV VTI ratio: 0.26  AORTA Ao Root diam: 3.70 cm Ao Asc diam:  3.40 cm MITRAL VALVE MV Area (PHT): 5.54 cm    SHUNTS MV Decel Time: 137 msec    Systemic VTI:  0.09 m MV E velocity: 79.30 cm/s  Systemic Diam: 2.20 cm MV A velocity: 48.00 cm/s MV E/A ratio:  1.65 Jodelle Red MD Electronically signed by Jodelle Red MD Signature Date/Time: 07/06/2019/3:50:01 PM    Final      Lafawn Lenoir T. Dashawna Delbridge [facility]  If 7PM-7AM, please contact night-coverage [email or web address] Password [email or web address] 07/07/2019, 1:45 PM

## 2019-07-07 NOTE — Consult Note (Signed)
Cardiology Consultation:   Patient ID: Larry Lewis MRN: [id number]; DOB: 03/19/1966  Admit date: 07/04/2019 Date of Consult: 07/07/2019  Primary Care Provider: Patient, No Pcp Per Primary Cardiologist: No primary care provider on file.  Primary Electrophysiologist:  None    Patient Profile:   Larry Lewis is a 54 y.o. male with a hx of CAD ([place] 2 st[place] hypertension, hyperlipidemia, asthma, tobacco use, anxiety who is being seen today for the evaluation of acute systolic heart failure at the request of Dr Alanda SlimGonfa.  History of Present Illness:   Larry Lewis is a 54 year old male with the above medical history who presents with worsening shortness of breath.  He recently returned from a trip 2 weeks ago.  States that he has had progressive dyspnea to the point that he is unable to lie flat or walk a few feet without becoming short of breath.  In the ED, was noted to have SPO2 down to 88%, improved on 4 L O2.  Labs notable for BNP 268, troponin XX 5 and 25.  CTA chest showed moderate bilateral pleural effusions, cardiomegaly.  TTE shows LVEF 35 to 40%, diffuse hypokinesis with preservation of function at apex, normal RV function, possible bicuspid aortic valve, no AS/AI, IVC is small/collapsible.  He has been diuresed with IV Lasix twice daily.  Net -1.3 L yesterday, -4.2 L on admission.  Renal function stable at creatinine 0.8.  Labs notable for LDL 211, A1c 12.2, hemoglobin 15.9.  He reports that his dyspnea has improved but remains short of breath.  Does report that he has intermittent chest pain.  Denies any chest pain at rest, but reports walking short distances will cause him to be short of breath and have chest pain.   Past Medical History:  Diagnosis Date  . AMI (acute myocardial infarction) (HCC)   . Anxiety   . Asthma   . Coronary artery disease   . High cholesterol   . Hypertension     Past Surgical History:  Procedure Laterality Date  . CARDIAC  CATHETERIZATION        Inpatient Medications: Scheduled Meds: . atorvastatin  40 mg Oral Daily  . enoxaparin (LOVENOX) injection  40 mg Subcutaneous Q24H  . furosemide  40 mg Intravenous BID  . insulin aspart  0-15 Units Subcutaneous TID WC  . insulin aspart  0-5 Units Subcutaneous QHS  . insulin aspart  8 Units Subcutaneous TID WC  . insulin glargine  15 Units Subcutaneous BID  . nicotine  21 mg Transdermal Daily  . pneumococcal 23 valent vaccine  0.5 mL Intramuscular Tomorrow-1000  . sodium chloride flush  3 mL Intravenous Q12H   Continuous Infusions: . sodium chloride     PRN Meds: sodium chloride, acetaminophen, nitroGLYCERIN, ondansetron (ZOFRAN) IV, sodium chloride flush  Allergies:    Allergies  Allergen Reactions  . Penicillins Itching    Social History:   Social History   Socioeconomic History  . Marital status: Single    Spouse name: Not on file  . Number of children: Not on file  . Years of education: Not on file  . Highest education level: Not on file  Occupational History  . Not on file  Tobacco Use  . Smoking status: Current Every Day Smoker    Packs/day: 1.00    Types: Cigarettes  . Smokeless tobacco: Never Used  Substance and Sexual Activity  . Alcohol use: Never  . Drug use: Never  . Sexual activity: Not  on file  Other Topics Concern  . Not on file  Social History Narrative  . Not on file   Social Determinants of Health   Financial Resource Strain:   . Difficulty of Paying Living Expenses:   Food Insecurity:   . Worried About Programme researcher, broadcasting/film/video in the Last Year:   . Barista in the Last Year:   Transportation Needs:   . Freight forwarder (Medical):   Marland Kitchen Lack of Transportation (Non-Medical):   Physical Activity:   . Days of Exercise per Week:   . Minutes of Exercise per Session:   Stress:   . Feeling of Stress :   Social Connections:   . Frequency of Communication with Friends and Family:   . Frequency of Social  Gatherings with Friends and Family:   . Attends Religious Services:   . Active Member of Clubs or Organizations:   . Attends Banker Meetings:   Marland Kitchen Marital Status:   Intimate Partner Violence:   . Fear of Current or Ex-Partner:   . Emotionally Abused:   Marland Kitchen Physically Abused:   . Sexually Abused:     Family History:   No family history on file.   ROS:  Please see the history of present illness.   All other ROS reviewed and negative.     Physical Exam/Data:   Vitals:   07/05/19 2010 07/06/19 0447 07/06/19 2131 07/07/19 0511  BP: 115/75 128/85 128/68 124/68  Pulse: (!) 108 100 (!) 112 97  Resp: 20 20 20 20   Temp: 99.3 F (37.4 C) 98.1 F (36.7 C) 98.2 F (36.8 C) 98.2 F (36.8 C)  TempSrc: Oral Oral Oral Oral  SpO2: 93% 93% 94% 94%  Weight:  80.6 kg  81 kg  Height:        Intake/Output Summary (Last 24 hours) at 07/07/2019 1314 Last data filed at 07/07/2019 0600 Gross per 24 hour  Intake 480 ml  Output 1280 ml  Net -800 ml   Last 3 Weights 07/07/2019 07/06/2019 07/05/2019  Weight (lbs) 178 lb 9.2 oz 177 lb 9.6 oz 178 lb 4.8 oz  Weight (kg) 81 kg 80.559 kg 80.876 kg     Body mass index is 28.03 kg/m.  General:  in no acute distress HEENT: normal Lymph: no adenopathy Neck: no JVD Endocrine:  No thryomegaly Vascular: No carotid bruits Cardiac:  normal S1, S2; RRR; no murmur  Lungs:  Scattered rhonchi Abd: soft, nontender, no hepatomegaly  Ext: no edema Musculoskeletal:  No deformities, BUE and BLE strength normal and equal Skin: warm and dry  Neuro:   no focal abnormalities noted Psych:  Normal affect   EKG:  The EKG was personally reviewed and demonstrates: Sinus rhythm, rate 118, Q waves V1/2, aVL, incomplete left bundle branch block, LVH with repolarization abnromalities Telemetry:  Telemetry was personally reviewed and demonstrates: Sinus rhythm 90s to 100s, PVCs  Relevant CV Studies: TTE 07/06/19: 1. Left ventricular ejection fraction, by  estimation, is 35 to 40%. The  left ventricle has moderately decreased function. The left ventricle  demonstrates regional wall motion abnormalities (see scoring  diagram/findings for description). Left ventricular  diastolic parameters are indeterminate.  2. Right ventricular systolic function is normal. The right ventricular  size is normal.  3. Left atrial size was moderately dilated.  4. The mitral valve is normal in structure. Mild mitral valve  regurgitation. No evidence of mitral stenosis.  5. Indeterminate cusp structure. Given age and amount  of calcification,  high suspicion for bicuspid valve, either functional or congenital.. The  aortic valve has an indeterminant number of cusps. Aortic valve  regurgitation is not visualized. Mild to  moderate aortic valve sclerosis/calcification is present, without any  evidence of aortic stenosis.  6. The inferior vena cava is normal in size with greater than 50%  respiratory variability, suggesting right atrial pressure of 3 mmHg.   Laboratory Data:  High Sensitivity Troponin:   Recent Labs  Lab 07/04/19 1750 07/04/19 2036  TROPONINIHS 25* 25*     Chemistry Recent Labs  Lab 07/04/19 1750 07/06/19 0538 07/07/19 0336  NA 136 136 135  K 3.6 3.1* 3.4*  CL 100 97* 99  CO2 25 27 25   GLUCOSE 381* 332* 249*  BUN 8 12 14   CREATININE 0.79 0.85 0.82  CALCIUM 9.4 8.6* 9.1  GFRNONAA >60 >60 >60  GFRAA >60 >60 >60  ANIONGAP 11 12 11     Recent Labs  Lab 07/07/19 0336  ALBUMIN 3.3*   Hematology Recent Labs  Lab 07/04/19 1750  WBC 9.1  RBC 6.55*  HGB 15.9  HCT 50.9  MCV 77.7*  MCH 24.3*  MCHC 31.2  RDW 14.5  PLT 312   BNP Recent Labs  Lab 07/04/19 1750  BNP 267.6*    DDimer No results for input(s): DDIMER in the last 168 hours.   Radiology/Studies:  CT Angio Chest PE W/Cm &/Or Wo Cm  Result Date: 07/04/2019 CLINICAL DATA:  Shortness of breath EXAM: CT ANGIOGRAPHY CHEST WITH CONTRAST TECHNIQUE:  Multidetector CT imaging of the chest was performed using the standard protocol during bolus administration of intravenous contrast. Multiplanar CT image reconstructions and MIPs were obtained to evaluate the vascular anatomy. CONTRAST:  07/06/19 OMNIPAQUE IOHEXOL 350 MG/ML SOLN COMPARISON:  07/04/2019 FINDINGS: Cardiovascular: This is a technically adequate evaluation of the pulmonary vasculature. No filling defects or pulmonary emboli. There is prominent left ventricular and left atrial dilatation. No pericardial effusion. Mild atherosclerosis of the aorta and coronary vessels. No evidence of thoracic aortic aneurysm or dissection. Mediastinum/Nodes: Subcarinal adenopathy measuring up to 14 mm in short axis. Borderline enlarged pretracheal nodes. These are nonspecific. Thyroid, trachea, and esophagus are unremarkable. Lungs/Pleura: There are moderate bilateral pleural effusions, estimated 1 L each. Areas of compressive atelectasis are seen within the dependent lower lobes. No airspace disease or pneumothorax. Central airways are patent. Upper Abdomen: Calcified gallstone is identified.  No cholecystitis. Musculoskeletal: No acute or destructive bony lesions. Reconstructed images demonstrate no additional findings. Review of the MIP images confirms the above findings. IMPRESSION: 1. No evidence of pulmonary embolus. 2. Moderate bilateral pleural effusions and dependent atelectasis. 3. Cardiomegaly, with prominent left atrial and left ventricular dilatation. 4. Likely reactive mediastinal adenopathy. 5.  Aortic Atherosclerosis (ICD10-I70.0). 6. Cholelithiasis. Electronically Signed   By: 07/06/2019 M.D.   On: 07/04/2019 21:58   07/06/2019 PELVIS LIMITED (TRANSABDOMINAL ONLY)  Result Date: 07/05/2019 CLINICAL DATA:  Bulge right inguinal region EXAM: LIMITED ULTRASOUND OF PELVIS TECHNIQUE: Limited transabdominal ultrasound examination of the pelvis was performed. COMPARISON:  10/04/2017 FINDINGS: Sonographic evaluation  of the right inguinal region was performed. A fat containing right inguinal hernia is identified. On previous CT, a portion of the bladder dome as well as the appendix extended into the right inguinal hernia. On today's exam, there is equivocal finding of bowel herniation. No free fluid. Normal appearing right inguinal lymph nodes are seen. IMPRESSION: 1. 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 Suspect bowel herniation on  today's study. No fluid or inflammatory change. Electronically Signed   By: Sharlet Salina M.D.   On: 07/05/2019 22:13   DG Chest Port 1 View  Result Date: 07/04/2019 CLINICAL DATA:  Shortness of breath EXAM: PORTABLE CHEST 1 VIEW COMPARISON:  None. FINDINGS: There is cardiomegaly with pulmonary venous hypertension. There is diffuse interstitial thickening with suspected interstitial edema. There is atelectatic change in the medial left base with slight airspace opacity. No pleural effusions evident. No adenopathy. No bone lesions. IMPRESSION: Cardiomegaly with pulmonary vascular congestion. There is interstitial thickening, likely due to interstitial edema. Medial left base atelectasis with suspect developing pneumonia or alveolar edema. Overall the appearance is concerning for a degree of congestive heart failure. There may well be superimposed pneumonia in the medial left base. Electronically Signed   By: Bretta Bang III M.D.   On: 07/04/2019 19:10   ECHOCARDIOGRAM COMPLETE  Result Date: 07/06/2019    ECHOCARDIOGRAM REPORT   Patient Name:   JOANDRY SLAGTER Date of Exam: 07/06/2019 Medical Rec #:  [id number]   Height:       66.9 in Accession #:    [id number]  Weight:       177.6 lb Date of Birth:  06-Nov-1965    BSA:          1.921 m Patient Age:    54 years    BP:           128/85 mmHg Patient Gender: M           HR:           97 bpm. Exam Location:  Inpatient Procedure: 2D Echo, Cardiac Doppler and Color Doppler Indications:    CHF-Acute Diastolic 428.31 I50.31   History:        Patient has no prior history of Echocardiogram examinations.                 Previous Myocardial Infarction and CAD.  Sonographer:    Roosvelt Maser RDCS Referring Phys: [id number] RONDELL A SMITH IMPRESSIONS  1. Left ventricular ejection fraction, by estimation, is 35 to 40%. The left ventricle has moderately decreased function. The left ventricle demonstrates regional wall motion abnormalities (see scoring diagram/findings for description). Left ventricular  diastolic parameters are indeterminate.  2. Right ventricular systolic function is normal. The right ventricular size is normal.  3. Left atrial size was moderately dilated.  4. The mitral valve is normal in structure. Mild mitral valve regurgitation. No evidence of mitral stenosis.  5. Indeterminate cusp structure. Given age and amount of calcification, high suspicion for bicuspid valve, either functional or congenital.. The aortic valve has an indeterminant number of cusps. Aortic valve regurgitation is not visualized. Mild to moderate aortic valve sclerosis/calcification is present, without any evidence of aortic stenosis.  6. The inferior vena cava is normal in size with greater than 50% respiratory variability, suggesting right atrial pressure of 3 mmHg. Comparison(s): No prior Echocardiogram. Conclusion(s)/Recommendation(s): Different focal wall motion abnormalities in multiple coronary territories. Nearly global abnormalities with preserved apex, but does not look like takostubo pattern. FINDINGS  Left Ventricle: Left ventricular ejection fraction, by estimation, is 35 to 40%. The left ventricle has moderately decreased function. The left ventricle demonstrates regional wall motion abnormalities. The left ventricular internal cavity size was normal in size. There is no left ventricular hypertrophy. Left ventricular diastolic parameters are indeterminate.  LV Wall Scoring: The anterior wall, antero-lateral wall, anterior septum, entire inferior  wall, posterior wall, mid inferoseptal segment, and basal  inferoseptal segment are hypokinetic. The apical lateral segment, apical septal segment, apical anterior segment, and apex are normal. Right Ventricle: The right ventricular size is normal. No increase in right ventricular wall thickness. Right ventricular systolic function is normal. Left Atrium: Left atrial size was moderately dilated. Right Atrium: Right atrial size was normal in size. Pericardium: There is no evidence of pericardial effusion. Mitral Valve: The mitral valve is normal in structure. Mild mitral valve regurgitation. No evidence of mitral valve stenosis. Tricuspid Valve: The tricuspid valve is normal in structure. Tricuspid valve regurgitation is trivial. No evidence of tricuspid stenosis. Aortic Valve: Indeterminate cusp structure. Given age and amount of calcification, high suspicion for bicuspid valve, either functional or congenital. The aortic valve has an indeterminant number of cusps. Aortic valve regurgitation is not visualized. Mild to moderate aortic valve sclerosis/calcification is present, without any evidence of aortic stenosis. There is mild calcification of the aortic valve. Aortic valve mean gradient measures 10.0 mmHg. Aortic valve peak gradient measures 16.9 mmHg. Aortic valve area, by VTI measures 0.99 cm. Pulmonic Valve: The pulmonic valve was grossly normal. Pulmonic valve regurgitation is not visualized. No evidence of pulmonic stenosis. Aorta: The aortic root and ascending aorta are structurally normal, with no evidence of dilitation. Pulmonary Artery: The pulmonary artery is not well seen. Venous: The inferior vena cava is normal in size with greater than 50% respiratory variability, suggesting right atrial pressure of 3 mmHg. IAS/Shunts: The atrial septum is grossly normal.  LEFT VENTRICLE PLAX 2D LVIDd:         4.60 cm      Diastology LVIDs:         3.90 cm      LV e' lateral:   9.57 cm/s LV PW:         1.10 cm       LV E/e' lateral: 8.3 LV IVS:        1.10 cm      LV e' medial:    7.51 cm/s LVOT diam:     2.20 cm      LV E/e' medial:  10.6 LV SV:         33 LV SV Index:   17 LVOT Area:     3.80 cm                              3D Volume EF: LV Volumes (MOD)            3D EF:        47 % LV vol d, MOD A2C: 121.0 ml LV EDV:       158 ml LV vol d, MOD A4C: 127.0 ml LV ESV:       84 ml LV vol s, MOD A2C: 78.2 ml  LV SV:        74 ml LV vol s, MOD A4C: 71.1 ml LV SV MOD A2C:     42.8 ml LV SV MOD A4C:     127.0 ml LV SV MOD BP:      50.5 ml RIGHT VENTRICLE            IVC RV Basal diam:  3.30 cm    IVC diam: 1.50 cm RV S prime:     9.79 cm/s TAPSE (M-mode): 2.2 cm LEFT ATRIUM           Index       RIGHT ATRIUM  Index LA diam:      4.40 cm 2.29 cm/m  RA Area:     15.10 cm LA Vol (A2C): 95.0 ml 49.45 ml/m RA Volume:   37.10 ml  19.31 ml/m  AORTIC VALVE AV Area (Vmax):    1.05 cm AV Area (Vmean):   1.02 cm AV Area (VTI):     0.99 cm AV Vmax:           205.50 cm/s AV Vmean:          151.000 cm/s AV VTI:            0.334 m AV Peak Grad:      16.9 mmHg AV Mean Grad:      10.0 mmHg LVOT Vmax:         56.80 cm/s LVOT Vmean:        40.700 cm/s LVOT VTI:          0.087 m LVOT/AV VTI ratio: 0.26  AORTA Ao Root diam: 3.70 cm Ao Asc diam:  3.40 cm MITRAL VALVE MV Area (PHT): 5.54 cm    SHUNTS MV Decel Time: 137 msec    Systemic VTI:  0.09 m MV E velocity: 79.30 cm/s  Systemic Diam: 2.20 cm MV A velocity: 48.00 cm/s MV E/A ratio:  1.65 Jodelle Red MD Electronically signed by Jodelle Red MD Signature Date/Time: 07/06/2019/3:50:01 PM    Final     Assessment and Plan:   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  TTE with EF 35 to 40%.  Given risk factors (uncontrolled diabetes, hyperlipidemia with LDL 211, tobacco use) and reported history of CAD, concern for ischemic cardiomyopathy -Continue IV Lasix 40 mg twice daily -Plan LHC/RHC once adequately diuresed and able to lie flat for  procedure  CAD: Reports history of 2 stents in 2008 in [place], [place].  Will need to obtain records. -Start aspirin 81 mg daily -Increase atorvastatin to 80 mg daily -Sublingual nitroglycerin as needed  Hyperlipidemia: LDL 211.  Will increase atorvastatin to 80 mg daily  Type 2 diabetes: Uncontrolled, A1c 12.2%.  Management per primary team, started on Lantus and SSI  Tobacco use: Risks of tobacco use discussed and cessation strongly encouraged.      For questions or updates, please contact [facility] Please consult [email or web address] for contact info under     Signed, Little Ishikawa, MD  07/07/2019 1:14 PM

## 2019-07-08 LAB — GLUCOSE, CAPILLARY
Glucose-Capillary: 237 mg/dL — ABNORMAL HIGH (ref 70–99)
Glucose-Capillary: 315 mg/dL — ABNORMAL HIGH (ref 70–99)
Glucose-Capillary: 240 mg/dL — ABNORMAL HIGH (ref 70–99)
Glucose-Capillary: 263 mg/dL — ABNORMAL HIGH (ref 70–99)

## 2019-07-08 LAB — RENAL FUNCTION PANEL
Albumin: 3.4 g/dL — ABNORMAL LOW (ref 3.5–5.0)
Anion gap: 13 (ref 5–15)
BUN: 18 mg/dL (ref 6–20)
CO2: 29 mmol/L (ref 22–32)
Calcium: 9.7 mg/dL (ref 8.9–10.3)
Chloride: 94 mmol/L — ABNORMAL LOW (ref 98–111)
Creatinine, Ser: 0.89 mg/dL (ref 0.61–1.24)
GFR calc Af Amer: >60 mL/min (ref >60)
GFR calc non Af Amer: >60 mL/min (ref >60)
Glucose, Bld: 239 mg/dL — ABNORMAL HIGH (ref 70–99)
Phosphorus: 4.8 mg/dL — ABNORMAL HIGH (ref 2.5–4.6)
Potassium: 3.4 mmol/L — ABNORMAL LOW (ref 3.5–5.1)
Sodium: 136 mmol/L (ref 135–145)

## 2019-07-08 LAB — MAGNESIUM: Magnesium: 2 mg/dL (ref 1.7–2.4)

## 2019-07-08 MED ORDER — SODIUM CHLORIDE 0.9% FLUSH: 3.0000 mL | INTRAVENOUS | Status: DC | PRN

## 2019-07-08 MED ORDER — ASPIRIN 81 MG PO CHEW
81.0000 mg | CHEWABLE_TABLET | ORAL | Status: AC
  Administered 2019-07-09: 81 mg via ORAL
  Filled 2019-07-08: qty 1

## 2019-07-08 MED ORDER — INSULIN ASPART 100 UNIT/ML ~~LOC~~ SOLN
0.0000 [IU] | Freq: Every day | SUBCUTANEOUS | Status: DC
  Administered 2019-07-08 – 2019-07-09 (×2): 3 [IU] via SUBCUTANEOUS
  Administered 2019-07-10: 2 [IU] via SUBCUTANEOUS

## 2019-07-08 MED ORDER — INSULIN STARTER KIT- PEN NEEDLES (ENGLISH)
1.0000 | Freq: Once | Status: AC
  Administered 2019-07-08: 1
  Filled 2019-07-08: qty 1

## 2019-07-08 MED ORDER — LIVING WELL WITH DIABETES BOOK
Freq: Once | Status: AC
  Filled 2019-07-08: qty 1

## 2019-07-08 MED ORDER — POTASSIUM CHLORIDE CRYS ER 20 MEQ PO TBCR
40.0000 meq | EXTENDED_RELEASE_TABLET | ORAL | Status: AC
  Administered 2019-07-08 (×2): 40 meq via ORAL
  Filled 2019-07-08 (×2): qty 2

## 2019-07-08 MED ORDER — SODIUM CHLORIDE 0.9% FLUSH
3.0000 mL | Freq: Two times a day (BID) | INTRAVENOUS | Status: DC
  Administered 2019-07-08: 3 mL via INTRAVENOUS

## 2019-07-08 MED ORDER — SODIUM CHLORIDE 0.9% FLUSH
3.0000 mL | Freq: Two times a day (BID) | INTRAVENOUS | Status: DC
  Administered 2019-07-08 – 2019-07-09 (×2): 3 mL via INTRAVENOUS

## 2019-07-08 MED ORDER — SODIUM CHLORIDE 0.9 % IV SOLN: 250.0000 mL | INTRAVENOUS | Status: DC | PRN

## 2019-07-08 MED ORDER — ASPIRIN 81 MG PO CHEW: 81.0000 mg | CHEWABLE_TABLET | ORAL | Status: DC

## 2019-07-08 MED ORDER — INSULIN ASPART 100 UNIT/ML ~~LOC~~ SOLN
0.0000 [IU] | Freq: Three times a day (TID) | SUBCUTANEOUS | Status: DC
  Administered 2019-07-08: 7 [IU] via SUBCUTANEOUS
  Administered 2019-07-09: 4 [IU] via SUBCUTANEOUS
  Administered 2019-07-09: 7 [IU] via SUBCUTANEOUS
  Administered 2019-07-09: 11 [IU] via SUBCUTANEOUS
  Administered 2019-07-10: 3 [IU] via SUBCUTANEOUS
  Administered 2019-07-10: 7 [IU] via SUBCUTANEOUS
  Administered 2019-07-10 – 2019-07-11 (×3): 3 [IU] via SUBCUTANEOUS

## 2019-07-08 MED ORDER — ENSURE MAX PROTEIN PO LIQD
11.0000 [oz_av] | Freq: Three times a day (TID) | ORAL | Status: DC
  Administered 2019-07-08 – 2019-07-11 (×7): 11 [oz_av] via ORAL
  Filled 2019-07-08 (×11): qty 330

## 2019-07-08 MED ORDER — SODIUM CHLORIDE 0.9 % IV SOLN: INTRAVENOUS | Status: DC

## 2019-07-08 MED ORDER — SPIRONOLACTONE 25 MG PO TABS
25.0000 mg | ORAL_TABLET | Freq: Every day | ORAL | Status: DC
  Administered 2019-07-08 – 2019-07-10 (×2): 25 mg via ORAL
  Filled 2019-07-08 (×2): qty 1

## 2019-07-08 NOTE — Progress Notes (Signed)
PROGRESS NOTE  Larry Lewis QQV:[id number] DOB: 01-01-1966   PCP: Patient, No Pcp Per.  Patient has no PCP.  Patient is from: Home.  DOA: 07/04/2019 LOS: 3  Brief Narrative / Interim history: 54 year old male with history of HTN, HLD, CAD, asthma, tobacco use disorder, right inguinal hernia and anxiety presenting with progressive shortness of breath, orthopnea, UTI symptoms and insomnia, and admitted with acute unknown type CHF.  In ED, 100-1 110s.  RR 20s to 30s.  Desaturated to 88% with ambulation on RA requiring 4 L to recover.  Glucose 332.  MCV 77.7.  Otherwise, CBC and BMP without significant finding.  BNP 267. HS Trop 25x2.  EKG with sinus tachycardia, J-point elevation in anterior leads and T wave changes in lateral leads. CXR with cardiomegaly and pulmonary vascular congestion.  COVID-19 PCR and influenza PCR negative.  CTA chest negative for PE but moderate bilateral pleural effusions, dependent atelectasis, cardiomegaly with prominent LA and LV dilation, mediastinal adenopathy and cholelithiasis.  Started on IV Lasix and 10 units of insulin and admitted for CHF exacerbation.  Continued on IV Lasix 40 mg twice daily.    TTE with EF of 35 to 40%, RWMA, indeterminate diastolic functions, moderate LAE with moderate aortic valve sclerosis/calcification and indeterminate cuspid structures.  Cardiology consulted on 4/18.  Plan for [facility] on 4/20.  Subjective: Seen and examined earlier this morning.  No major events overnight of this morning.  No complaints other than some discomfort in the right groin with cough.  Denies chest pain, dyspnea, GI or UTI symptoms.  Urine output not captured well.  Objective: Vitals:   07/07/19 2045 07/08/19 0354 07/08/19 0940 07/08/19 1334  BP: (!) 125/94 119/89 (!) 115/93 113/83  Pulse: (!) 105 96  100  Resp: 15 17  17   Temp: 98.1 F (36.7 C) 98.1 F (36.7 C)  98.7 F (37.1 C)  TempSrc: Oral Oral  Oral  SpO2: 99% 95%  98%  Weight:  79.4 kg    Height:         Intake/Output Summary (Last 24 hours) at 07/08/2019 1602 Last data filed at 07/08/2019 1300 Gross per 24 hour  Intake 642 ml  Output 400 ml  Net 242 ml   Filed Weights   07/06/19 0447 07/07/19 0511 07/08/19 0354  Weight: 80.6 kg 81 kg 79.4 kg    Examination:  GENERAL: No apparent distress.  Nontoxic. HEENT: MMM.  Vision and hearing grossly intact.  NECK: Supple.  No apparent JVD.  RESP: 96% on RA.  No IWOB.  Fair aeration bilaterally. CVS:  RRR. Heart sounds normal.  ABD/GI/GU: Bowel sounds present. Soft. Non tender.  Right inguinal hernia and rectus diastases. MSK/EXT:  Moves extremities. No apparent deformity. No edema.  SKIN: no apparent skin lesion or wound NEURO: Awake, alert and oriented appropriately.  No apparent focal neuro deficit. PSYCH: Calm. Normal affect.  Procedures:  None  Microbiology summarized: COVID-19 PCR negative. Influenza A/B PCR negative.  Assessment & Plan: Acute respiratory failure with hypoxia due to unknown type and acute systolic CHF: TTE with EF of 35 to 40%, RWMA, indeterminate diastolic functions, moderate LAE with moderate aortic valve sclerosis/calcification and indeterminate cuspid structures.  No previous echo in chart. CXR, CTA and BNP consistent with acute CHF.  Responding to IV Lasix.  UOP not captured well.  Weight about 3 pounds down.  Renal function relatively stable. -Cardiology following-plan for [facility] on 4/20 -Continue IV Lasix 40 mg twice daily -Added Aldactone 25 mg daily for  hypokalemia -Monitor fluid status, renal function and electrolytes.  Elevated troponin: Likely demand ischemia due to acute CHF.  EKG with sinus tachycardia, J-point elevation in anterior leads and nonspecific T wave changes in lateral leads.  Currently without chest pain.  TTE as above. -Cardiology following. -Manage CHF as above  Moderate bilateral pleural effusion: Likely due to CHF. -Continue IV Lasix -Repeat CXR after adequate  diuresis  Mediastinal lymphadenopathy: Likely reactive from CHF -Manage as above  Uncontrolled DM-2 with hyperglycemia: A1c 12.2%.  Not on medication at home. Recent Labs    07/08/19 0823 07/08/19 1137 07/08/19 1559  GLUCAP 237* 315* 240*  -Continue Lantus 15 units twice daily.   -Continue NovoLog 8 units AC -Increase SSI to-high -Continue atorvastatin 40 mg daily. -Appreciate diabetic coordinator help.   Essential hypertension?  Normotensive.  Not on medication at home. -IV Lasix as above  Tobacco use disorder -Encourage cessation. -Nicotine patch.  Hypokalemia: Likely due to diuretics.  Magnesium was normal. -Replenish and recheck. -Add Aldactone 25 mg daily  Hyperlipidemia: LDL 211. -Started atorvastatin 40 mg daily.  Chronic right inguinal hernia/rectus diastases-no incarceration. -Continue monitoring                DVT prophylaxis: Subcu Lovenox Code Status: Full code Family Communication: Patient and/or RN.  Updated patient's cousin over the phone on 4/18.  Discharge barrier: Evaluation and management of acute CHF requiring IV diuretics.  Plan for [facility] on 4/20. Patient is from: Home. Final disposition: Likely home once CHF evaluation completed, adequately diuresed and cleared by cardiology  Consultants:  Cardiology   Sch Meds:  Scheduled Meds: . aspirin  81 mg Oral Daily  . [START ON 07/09/2019] aspirin  81 mg Oral Pre-Cath  . atorvastatin  80 mg Oral Daily  . enoxaparin (LOVENOX) injection  40 mg Subcutaneous Q24H  . insulin aspart  0-15 Units Subcutaneous TID WC  . insulin aspart  0-5 Units Subcutaneous QHS  . insulin aspart  8 Units Subcutaneous TID WC  . insulin glargine  15 Units Subcutaneous BID  . insulin starter kit- pen needles  1 kit Other Once  . living well with diabetes book   Does not apply Once  . nicotine  21 mg Transdermal Daily  . pneumococcal 23 valent vaccine  0.5 mL Intramuscular Tomorrow-1000  . sodium chloride flush   3 mL Intravenous Q12H  . sodium chloride flush  3 mL Intravenous Q12H  . spironolactone  25 mg Oral Daily   Continuous Infusions: . sodium chloride    . [START ON 07/09/2019] sodium chloride    . sodium chloride     PRN Meds:.sodium chloride, sodium chloride, acetaminophen, nitroGLYCERIN, ondansetron (ZOFRAN) IV, sodium chloride flush, sodium chloride flush  Antimicrobials: Anti-infectives (From admission, onward)   None       I have personally reviewed the following labs and images: CBC: Recent Labs  Lab 07/04/19 1750  WBC 9.1  HGB 15.9  HCT 50.9  MCV 77.7*  PLT 312   BMP &GFR Recent Labs  Lab 07/04/19 1750 07/06/19 0538 07/07/19 0336 07/08/19 0350  NA 136 136 135 136  K 3.6 3.1* 3.4* 3.4*  CL 100 97* 99 94*  CO2 25 27 25 29   GLUCOSE 381* 332* 249* 239*  BUN 8 12 14 18   CREATININE 0.79 0.85 0.82 0.89  CALCIUM 9.4 8.6* 9.1 9.7  MG  --  1.8 2.0 2.0  PHOS  --   --  4.7* 4.8*   Estimated Creatinine Clearance:  94.6 mL/min (by C-G formula based on SCr of 0.89 mg/dL). Liver & Pancreas: Recent Labs  Lab 07/07/19 0336 07/08/19 0350  ALBUMIN 3.3* 3.4*   No results for input(s): LIPASE, AMYLASE in the last 168 hours. No results for input(s): AMMONIA in the last 168 hours. Diabetic: Recent Labs    07/05/19 1622  HGBA1C 12.2*   Recent Labs  Lab 07/07/19 1648 07/07/19 2105 07/08/19 0823 07/08/19 1137 07/08/19 1559  GLUCAP 271* 191* 237* 315* 240*   Cardiac Enzymes: No results for input(s): CKTOTAL, CKMB, CKMBINDEX, TROPONINI in the last 168 hours. No results for input(s): PROBNP in the last 8760 hours. Coagulation Profile: No results for input(s): INR, PROTIME in the last 168 hours. Thyroid Function Tests: Recent Labs    07/06/19 0538  TSH 0.527   Lipid Profile: Recent Labs    07/07/19 0336  CHOL 274*  HDL 34*  LDLCALC 211*  TRIG 145  CHOLHDL 8.1   Anemia Panel: Recent Labs    07/06/19 0538  FERRITIN 377*  TIBC 342  IRON 51    Urine analysis:    Component Value Date/Time   COLORURINE COLORLESS (A) 07/05/2019 1749   APPEARANCEUR CLEAR 07/05/2019 1749   LABSPEC 1.002 (L) 07/05/2019 1749   PHURINE 7.0 07/05/2019 1749   GLUCOSEU >=500 (A) 07/05/2019 1749   HGBUR NEGATIVE 07/05/2019 1749   [place] 07/05/2019 1749   KETONESUR NEGATIVE 07/05/2019 1749   PROTEINUR NEGATIVE 07/05/2019 1749   NITRITE NEGATIVE 07/05/2019 1749   LEUKOCYTESUR NEGATIVE 07/05/2019 1749   Sepsis Labs: Invalid input(s): PROCALCITONIN, [place]  Microbiology: Recent Results (from the past 240 hour(s))  Respiratory Panel by RT PCR (Flu A&B, Covid) - Nasopharyngeal Swab     Status: None   Collection Time: 07/04/19  8:36 PM   Specimen: Nasopharyngeal Swab  Result Value Ref Range Status   SARS Coronavirus 2 by RT PCR NEGATIVE NEGATIVE Final    Comment: (NOTE) SARS-CoV-2 target nucleic acids are NOT DETECTED. The SARS-CoV-2 RNA is generally detectable in upper respiratoy specimens during the acute phase of infection. The lowest concentration of SARS-CoV-2 viral copies this assay can detect is 131 copies/mL. A negative result does not preclude SARS-Cov-2 infection and should not be used as the sole basis for treatment or other patient management decisions. A negative result may occur with  improper specimen collection/handling, submission of specimen other than nasopharyngeal swab, presence of viral mutation(s) within the areas targeted by this assay, and inadequate number of viral copies (<131 copies/mL). A negative result must be combined with clinical observations, patient history, and epidemiological information. The expected result is Negative. Fact Sheet for Patients:  PinkCheek.be Fact Sheet for Healthcare Providers:  GravelBags.it This test is not yet ap proved or cleared by the [place] FDA and  has been authorized for detection and/or diagnosis  of SARS-CoV-2 by FDA under an Emergency Use Authorization (EUA). This EUA will remain  in effect (meaning this test can be used) for the duration of the COVID-19 declaration under Section 564(b)(1) of the Act, 21 U.S.C. section 360bbb-3(b)(1), unless the authorization is terminated or revoked sooner.    Influenza A by PCR NEGATIVE NEGATIVE Final   Influenza B by PCR NEGATIVE NEGATIVE Final    Comment: (NOTE) The Xpert Xpress SARS-CoV-2/FLU/RSV assay is intended as an aid in  the diagnosis of influenza from Nasopharyngeal swab specimens and  should not be used as a sole basis for treatment. Nasal washings and  aspirates are unacceptable for Xpert Xpress SARS-CoV-2/FLU/RSV  testing. Fact Sheet for Patients: PinkCheek.be Fact Sheet for Healthcare Providers: GravelBags.it This test is not yet approved or cleared by the [place] FDA and  has been authorized for detection and/or diagnosis of SARS-CoV-2 by  FDA under an Emergency Use Authorization (EUA). This EUA will remain  in effect (meaning this test can be used) for the duration of the  Covid-19 declaration under Section 564(b)(1) of the Act, 21  U.S.C. section 360bbb-3(b)(1), unless the authorization is  terminated or revoked. Performed at [facility], [address] [place], [place] [address]     Radiology Studies: No results found.   Bridey Brookover T. La Conner  If 7PM-7AM, please contact night-coverage [email or web address] Password [facility] 07/08/2019, 4:02 PM

## 2019-07-08 NOTE — Progress Notes (Signed)
Initial Nutrition Assessment  DOCUMENTATION CODES:   Not applicable  INTERVENTION:  Ensure Max TID, each supplement is 150 kcal and 30 grams protein  Provided diabetes diet information   NUTRITION DIAGNOSIS:   Increased nutrient needs related to acute illness(acute unknown type CHF) as evidenced by estimated needs.    GOAL:   Patient will meet greater than or equal to 90% of their needs    MONITOR:   PO intake, Supplement acceptance, Labs, Weight trends, I & O's  REASON FOR ASSESSMENT:   Consult Diet education  ASSESSMENT:   Pt with a PMH significant of HTN, HLD, CAD, asthma, tobacco use disorder, right inguinal hernia and anxiety presenting with progressive shortness of breath, orthopnea, UTI symptoms and insomnia, and admitted with acute unknown type CHF  Pt reports his appetite is great and that he is "eating too much." Pt eating at least 3 meals per day, but did not provide more specific information regarding his intake. Pt reports a 45 lb unintentional wt loss over the last year. Limited weight history available in chart for review.  PO Intake: 75-100% x4 recorded meals  Labs reviewed: K+ 3.4 (L), Phosphorus 4.8 (H) CBGs: 237-315-240 (Diabetes Coordinator following)  Medications reviewed and include: novolog, Lantus, aldactone  UOP: x24 hours I/O: -3,976ml since admit   NUTRITION - FOCUSED PHYSICAL EXAM:    Most Recent Value  Orbital Region  No depletion  Upper Arm Region  No depletion  Thoracic and Lumbar Region  No depletion  Buccal Region  No depletion  Temple Region  Mild depletion  Clavicle Bone Region  Mild depletion  Clavicle and Acromion Bone Region  No depletion  Scapular Bone Region  No depletion  Dorsal Hand  No depletion  Patellar Region  Mild depletion  Anterior Thigh Region  Moderate depletion  Posterior Calf Region  Mild depletion  Edema (RD Assessment)  None  Hair  Reviewed  Eyes  Reviewed  Mouth  Reviewed  Skin   Reviewed  Nails  Reviewed       Diet Order:   Diet Order            Diet NPO time specified Except for: Sips with Meds  Diet effective midnight        Diet NPO time specified Except for: Sips with Meds  Diet effective midnight        Diet heart healthy/carb modified Room service appropriate? Yes; Fluid consistency: Thin  Diet effective now              EDUCATION NEEDS:   Education needs have been addressed  Skin:  Skin Assessment: Reviewed RN Assessment  Last BM:  4/16  Height:   Ht Readings from Last 1 Encounters:  07/05/19 5' 6.93" (1.7 m)    Weight:   Wt Readings from Last 2 Encounters:  07/08/19 79.4 kg  10/04/17 85.4 kg    BMI:  Body mass index is 27.47 kg/m.  Estimated Nutritional Needs:   Kcal:  2100-2300  Protein:  105-115 grams  Fluid:  >2L/d    Eugene Gavia, MS, RD, LDN RD pager number and weekend/on-call pager number located in Amion.

## 2019-07-08 NOTE — Progress Notes (Addendum)
Progress Note  Patient Name: Larry Lewis Date of Encounter: 07/08/2019  Primary Cardiologist: Shirlyn Goltz, MD  Subjective   No chest pain, SOB is improved  Inpatient Medications    Scheduled Meds: . aspirin  81 mg Oral Daily  . atorvastatin  80 mg Oral Daily  . enoxaparin (LOVENOX) injection  40 mg Subcutaneous Q24H  . furosemide  40 mg Intravenous BID  . insulin aspart  0-15 Units Subcutaneous TID WC  . insulin aspart  0-5 Units Subcutaneous QHS  . insulin aspart  8 Units Subcutaneous TID WC  . insulin glargine  15 Units Subcutaneous BID  . nicotine  21 mg Transdermal Daily  . pneumococcal 23 valent vaccine  0.5 mL Intramuscular Tomorrow-1000  . potassium chloride  40 mEq Oral Q4H  . sodium chloride flush  3 mL Intravenous Q12H  . spironolactone  25 mg Oral Daily   Continuous Infusions: . sodium chloride     PRN Meds: sodium chloride, acetaminophen, nitroGLYCERIN, ondansetron (ZOFRAN) IV, sodium chloride flush   Vital Signs    Vitals:   07/07/19 0511 07/07/19 2045 07/08/19 0354 07/08/19 0940  BP: 124/68 (!) 125/94 119/89 (!) 115/93  Pulse: 97 (!) 105 96   Resp: 20 15 17    Temp: 98.2 F (36.8 C) 98.1 F (36.7 C) 98.1 F (36.7 C)   TempSrc: Oral Oral Oral   SpO2: 94% 99% 95%   Weight: 81 kg  79.4 kg   Height:        Intake/Output Summary (Last 24 hours) at 07/08/2019 1000 Last data filed at 07/08/2019 07/10/2019 Gross per 24 hour  Intake 240 ml  Output --  Net 240 ml   Filed Weights   07/06/19 0447 07/07/19 0511 07/08/19 0354  Weight: 80.6 kg 81 kg 79.4 kg   Last Weight  Most recent update: 07/08/2019  3:56 AM   Weight  79.4 kg (175 lb)           Weight change: -1.62 kg   Telemetry    SR, occ PVCs - Personally Reviewed  ECG    None today - Personally Reviewed  Physical Exam   General: Well developed, well nourished, male appearing in no acute distress. Head: Normocephalic, atraumatic.  Neck: Supple without bruits, JVD 8  cm. Lungs:  Resp regular and unlabored, few rales bases. Heart: RRR, S1, S2, no S3, S4, or murmur; no rub. Abdomen: Soft, non-tender, non-distended with normoactive bowel sounds. No hepatomegaly. No rebound/guarding. No obvious abdominal masses. Extremities: No clubbing, cyanosis, no edema. Distal pedal pulses are 2+ bilaterally. Neuro: Alert and oriented X 3. Moves all extremities spontaneously. Psych: Normal affect.  Labs    Hematology Recent Labs  Lab 07/04/19 1750  WBC 9.1  RBC 6.55*  HGB 15.9  HCT 50.9  MCV 77.7*  MCH 24.3*  MCHC 31.2  RDW 14.5  PLT 312    Chemistry Recent Labs  Lab 07/06/19 0538 07/07/19 0336 07/08/19 0350  NA 136 135 136  K 3.1* 3.4* 3.4*  CL 97* 99 94*  CO2 27 25 29   GLUCOSE 332* 249* 239*  BUN 12 14 18   CREATININE 0.85 0.82 0.89  CALCIUM 8.6* 9.1 9.7  ALBUMIN  --  3.3* 3.4*  GFRNONAA >60 >60 >60  GFRAA >60 >60 >60  ANIONGAP 12 11 13      High Sensitivity Troponin:   Recent Labs  Lab 07/04/19 1750 07/04/19 2036  TROPONINIHS 25* 25*      BNP Recent Labs  Lab 07/04/19 1750  BNP 267.6*   Lab Results  Component Value Date   CHOL 274 (H) 07/07/2019   HDL 34 (L) 07/07/2019   LDLCALC 211 (H) 07/07/2019   TRIG 145 07/07/2019   CHOLHDL 8.1 07/07/2019   Lab Results  Component Value Date   HGBA1C 12.2 (H) 07/05/2019   Lab Results  Component Value Date   TSH 0.527 07/06/2019      Radiology    CT Angio Chest PE W/Cm &/Or Wo Cm  Result Date: 07/04/2019 CLINICAL DATA:  Shortness of breath EXAM: CT ANGIOGRAPHY CHEST WITH CONTRAST TECHNIQUE: Multidetector CT imaging of the chest was performed using the standard protocol during bolus administration of intravenous contrast. Multiplanar CT image reconstructions and MIPs were obtained to evaluate the vascular anatomy. CONTRAST:  OMNIPAQUE IOHEXOL 350 MG/ML SOLN COMPARISON:  07/04/2019 FINDINGS: Cardiovascular: This is a technically adequate evaluation of the pulmonary  vasculature. No filling defects or pulmonary emboli. There is prominent left ventricular and left atrial dilatation. No pericardial effusion. Mild atherosclerosis of the aorta and coronary vessels. No evidence of thoracic aortic aneurysm or dissection. Mediastinum/Nodes: Subcarinal adenopathy measuring up to 14 mm in short axis. Borderline enlarged pretracheal nodes. These are nonspecific. Thyroid, trachea, and esophagus are unremarkable. Lungs/Pleura: There are moderate bilateral pleural effusions, estimated 1 L each. Areas of compressive atelectasis are seen within the dependent lower lobes. No airspace disease or pneumothorax. Central airways are patent. Upper Abdomen: Calcified gallstone is identified.  No cholecystitis. Musculoskeletal: No acute or destructive bony lesions. Reconstructed images demonstrate no additional findings. Review of the MIP images confirms the above findings. IMPRESSION: 1. No evidence of pulmonary embolus. 2. Moderate bilateral pleural effusions and dependent atelectasis. 3. Cardiomegaly, with prominent left atrial and left ventricular dilatation. 4. Likely reactive mediastinal adenopathy. 5.  Aortic Atherosclerosis (ICD10-I70.0). 6. Cholelithiasis. Electronically Signed   By: Sharlet Salina M.D.   On: 07/04/2019 21:58   US PELVIS LIMITED (TRANSABDOMINAL ONLY)  Result Date: 07/05/2019 CLINICAL DATA:  Bulge right inguinal region EXAM: LIMITED ULTRASOUND OF PELVIS TECHNIQUE: Limited transabdominal ultrasound examination of the pelvis was performed. COMPARISON:  10/04/2017 FINDINGS: Sonographic evaluation of the right inguinal region was performed. A fat containing right inguinal hernia is identified. On previous CT, a portion of the bladder dome as well as the appendix extended into the right inguinal hernia. On today's exam, there is equivocal finding of bowel herniation. No free fluid. Normal appearing right inguinal lymph nodes are seen. IMPRESSION: 1. Findings compatible with  chronic known right inguinal hernia. Suspect bowel herniation on today's study. No fluid or inflammatory change. Electronically Signed   By: Sharlet Salina M.D.   On: 07/05/2019 22:13   DG Chest Port 1 View  Result Date: 07/04/2019 CLINICAL DATA:  Shortness of breath EXAM: PORTABLE CHEST 1 VIEW COMPARISON:  None. FINDINGS: There is cardiomegaly with pulmonary venous hypertension. There is diffuse interstitial thickening with suspected interstitial edema. There is atelectatic change in the medial left base with slight airspace opacity. No pleural effusions evident. No adenopathy. No bone lesions. IMPRESSION: Cardiomegaly with pulmonary vascular congestion. There is interstitial thickening, likely due to interstitial edema. Medial left base atelectasis with suspect developing pneumonia or alveolar edema. Overall the appearance is concerning for a degree of congestive heart failure. There may well be superimposed pneumonia in the medial left base. Electronically Signed   By: Bretta Bang III M.D.   On: 07/04/2019 19:10   ECHOCARDIOGRAM COMPLETE  Result Date: 07/06/2019    ECHOCARDIOGRAM  REPORT   Patient Name:   LC JOYNT Date of Exam: 07/06/2019 Medical Rec #:  [id number]   Height:       66.9 in Accession #:    [id number]  Weight:       177.6 lb Date of Birth:  [birth year]    BSA:          1.921 m Patient Age:    54 years    BP:           128/85 mmHg Patient Gender: M           HR:           97 bpm. Exam Location:  Inpatient Procedure: 2D Echo, Cardiac Doppler and Color Doppler Indications:    CHF-Acute Diastolic 428.31 I50.31  History:        Patient has no prior history of Echocardiogram examinations.                 Previous Myocardial Infarction and CAD.  Sonographer:    Roosvelt Maser RDCS Referring Phys: [id number] RONDELL A SMITH IMPRESSIONS  1. Left ventricular ejection fraction, by estimation, is 35 to 40%. The left ventricle has moderately decreased function. The left ventricle demonstrates regional wall  motion abnormalities (see scoring diagram/findings for description). Left ventricular  diastolic parameters are indeterminate.  2. Right ventricular systolic function is normal. The right ventricular size is normal.  3. Left atrial size was moderately dilated.  4. The mitral valve is normal in structure. Mild mitral valve regurgitation. No evidence of mitral stenosis.  5. Indeterminate cusp structure. Given age and amount of calcification, high suspicion for bicuspid valve, either functional or congenital.. The aortic valve has an indeterminant number of cusps. Aortic valve regurgitation is not visualized. Mild to moderate aortic valve sclerosis/calcification is present, without any evidence of aortic stenosis.  6. The inferior vena cava is normal in size with greater than 50% respiratory variability, suggesting right atrial pressure of 3 mmHg. Comparison(s): No prior Echocardiogram. Conclusion(s)/Recommendation(s): Different focal wall motion abnormalities in multiple coronary territories. Nearly global abnormalities with preserved apex, but does not look like takostubo pattern. FINDINGS  Left Ventricle: Left ventricular ejection fraction, by estimation, is 35 to 40%. The left ventricle has moderately decreased function. The left ventricle demonstrates regional wall motion abnormalities. The left ventricular internal cavity size was normal in size. There is no left ventricular hypertrophy. Left ventricular diastolic parameters are indeterminate.  LV Wall Scoring: The anterior wall, antero-lateral wall, anterior septum, entire inferior wall, posterior wall, mid inferoseptal segment, and basal inferoseptal segment are hypokinetic. The apical lateral segment, apical septal segment, apical anterior segment, and apex are normal. Right Ventricle: The right ventricular size is normal. No increase in right ventricular wall thickness. Right ventricular systolic function is normal. Left Atrium: Left atrial size was  moderately dilated. Right Atrium: Right atrial size was normal in size. Pericardium: There is no evidence of pericardial effusion. Mitral Valve: The mitral valve is normal in structure. Mild mitral valve regurgitation. No evidence of mitral valve stenosis. Tricuspid Valve: The tricuspid valve is normal in structure. Tricuspid valve regurgitation is trivial. No evidence of tricuspid stenosis. Aortic Valve: Indeterminate cusp structure. Given age and amount of calcification, high suspicion for bicuspid valve, either functional or congenital. The aortic valve has an indeterminant number of cusps. Aortic valve regurgitation is not visualized. Mild to moderate aortic valve sclerosis/calcification is present, without any evidence of aortic stenosis. There is mild calcification of the aortic valve. Aortic valve  mean gradient measures 10.0 mmHg. Aortic valve peak gradient measures 16.9 mmHg. Aortic valve area, by VTI measures 0.99 cm. Pulmonic Valve: The pulmonic valve was grossly normal. Pulmonic valve regurgitation is not visualized. No evidence of pulmonic stenosis. Aorta: The aortic root and ascending aorta are structurally normal, with no evidence of dilitation. Pulmonary Artery: The pulmonary artery is not well seen. Venous: The inferior vena cava is normal in size with greater than 50% respiratory variability, suggesting right atrial pressure of 3 mmHg. IAS/Shunts: The atrial septum is grossly normal.  LEFT VENTRICLE PLAX 2D LVIDd:         4.60 cm      Diastology LVIDs:         3.90 cm      LV e' lateral:   9.57 cm/s LV PW:         1.10 cm      LV E/e' lateral: 8.3 LV IVS:        1.10 cm      LV e' medial:    7.51 cm/s LVOT diam:     2.20 cm      LV E/e' medial:  10.6 LV SV:         33 LV SV Index:   17 LVOT Area:     3.80 cm                              3D Volume EF: LV Volumes (MOD)            3D EF:        47 % LV vol d, MOD A2C: 121.0 ml LV EDV:       158 ml LV vol d, MOD A4C: 127.0 ml LV ESV:       84 ml LV  vol s, MOD A2C: 78.2 ml  LV SV:        74 ml LV vol s, MOD A4C: 71.1 ml LV SV MOD A2C:     42.8 ml LV SV MOD A4C:     127.0 ml LV SV MOD BP:      50.5 ml RIGHT VENTRICLE            IVC RV Basal diam:  3.30 cm    IVC diam: 1.50 cm RV S prime:     9.79 cm/s TAPSE (M-mode): 2.2 cm LEFT ATRIUM           Index       RIGHT ATRIUM           Index LA diam:      4.40 cm 2.29 cm/m  RA Area:     15.10 cm LA Vol (A2C): 95.0 ml 49.45 ml/m RA Volume:   37.10 ml  19.31 ml/m  AORTIC VALVE AV Area (Vmax):    1.05 cm AV Area (Vmean):   1.02 cm AV Area (VTI):     0.99 cm AV Vmax:           205.50 cm/s AV Vmean:          151.000 cm/s AV VTI:            0.334 m AV Peak Grad:      16.9 mmHg AV Mean Grad:      10.0 mmHg LVOT Vmax:         56.80 cm/s LVOT Vmean:        40.700 cm/s LVOT VTI:            0.087 m LVOT/AV VTI ratio: 0.26  AORTA Ao Root diam: 3.70 cm Ao Asc diam:  3.40 cm MITRAL VALVE MV Area (PHT): 5.54 cm    SHUNTS MV Decel Time: 137 msec    Systemic VTI:  0.09 m MV E velocity: 79.30 cm/s  Systemic Diam: 2.20 cm MV A velocity: 48.00 cm/s MV E/A ratio:  1.65 Buford Dresser MD Electronically signed by Buford Dresser MD Signature Date/Time: 07/06/2019/3:50:01 PM    Final      Cardiac Studies   ECHO:    Patient Profile     54 y.o. male w/ hx  CAD (2 stents placed 2007 in [place], [place]), DM, hypertension, hyperlipidemia, asthma, tobacco use, anxiety was admitted 04/16 w/ SOB, CHF, uncontrolled CRFs  Assessment & Plan    1. Acute combined S-D-CHF - Pt says he used to weigh 200 lbs, does not have scales - wt down 3 lbs since admit, I/O net - 3.9 L - sx much improved - K+ low w/ diuresis, got 80 meq 04/18, has that ordered. Will give add'l 40 meq - needs R/L cath to eval, feel we can do that tomorrow.  2. HLD - LDL >200, Lipitor increased to 80 mg qd - recheck L&L 12 weeks.  Otherwise, per IM, needs better DM control Principal Problem:   Acute respiratory failure with hypoxia (HCC) Active  Problems:   Acute exacerbation of CHF (congestive heart failure) ([place])   Uncontrolled type 2 diabetes mellitus with hyperglycemia (HCC)   Tobacco abuse   Bilateral pleural effusion   Signed, Rosaria Ferries , PA-C 10:00 AM 07/08/2019 Pager: [phone]  Patient seen and examined.  Agree with above documentation.  On exam, patient is alert and oriented, regular rate and rhythm, no murmurs, lungs CTAB, no LE edema or JVD.  Urine output not recorded yesterday.  Creatinine stable at 0.9.  He reports feeling symptomatically improved, states that dyspnea has improved and he denies any chest pain.  Suspect ischemic cardiomyopathy.  Plan RHC/LHC tomorrow to work-up systolic heart failure.   Risks and benefits of cardiac catheterization have been discussed with the patient.  These include bleeding, infection, kidney damage, stroke, heart attack, death.  The patient understands these risks and is willing to proceed.  Donato Heinz, MD

## 2019-07-08 NOTE — H&P (View-Only) (Signed)
Progress Note  Patient Name: Larry Lewis Date of Encounter: 07/08/2019  Primary Cardiologist: Shirlyn Goltz, MD  Subjective   No chest pain, SOB is improved  Inpatient Medications    Scheduled Meds: . aspirin  81 mg Oral Daily  . atorvastatin  80 mg Oral Daily  . enoxaparin (LOVENOX) injection  40 mg Subcutaneous Q24H  . furosemide  40 mg Intravenous BID  . insulin aspart  0-15 Units Subcutaneous TID WC  . insulin aspart  0-5 Units Subcutaneous QHS  . insulin aspart  8 Units Subcutaneous TID WC  . insulin glargine  15 Units Subcutaneous BID  . nicotine  21 mg Transdermal Daily  . pneumococcal 23 valent vaccine  0.5 mL Intramuscular Tomorrow-1000  . potassium chloride  40 mEq Oral Q4H  . sodium chloride flush  3 mL Intravenous Q12H  . spironolactone  25 mg Oral Daily   Continuous Infusions: . sodium chloride     PRN Meds: sodium chloride, acetaminophen, nitroGLYCERIN, ondansetron (ZOFRAN) IV, sodium chloride flush   Vital Signs    Vitals:   07/07/19 0511 07/07/19 2045 07/08/19 0354 07/08/19 0940  BP: 124/68 (!) 125/94 119/89 (!) 115/93  Pulse: 97 (!) 105 96   Resp: 20 15 17    Temp: 98.2 F (36.8 C) 98.1 F (36.7 C) 98.1 F (36.7 C)   TempSrc: Oral Oral Oral   SpO2: 94% 99% 95%   Weight: 81 kg  79.4 kg   Height:        Intake/Output Summary (Last 24 hours) at 07/08/2019 1000 Last data filed at 07/08/2019 07/10/2019 Gross per 24 hour  Intake 240 ml  Output --  Net 240 ml   Filed Weights   07/06/19 0447 07/07/19 0511 07/08/19 0354  Weight: 80.6 kg 81 kg 79.4 kg   Last Weight  Most recent update: 07/08/2019  3:56 AM   Weight  79.4 kg (175 lb)           Weight change: -1.62 kg   Telemetry    SR, occ PVCs - Personally Reviewed  ECG    None today - Personally Reviewed  Physical Exam   General: Well developed, well nourished, male appearing in no acute distress. Head: Normocephalic, atraumatic.  Neck: Supple without bruits, JVD 8  cm. Lungs:  Resp regular and unlabored, few rales bases. Heart: RRR, S1, S2, no S3, S4, or murmur; no rub. Abdomen: Soft, non-tender, non-distended with normoactive bowel sounds. No hepatomegaly. No rebound/guarding. No obvious abdominal masses. Extremities: No clubbing, cyanosis, no edema. Distal pedal pulses are 2+ bilaterally. Neuro: Alert and oriented X 3. Moves all extremities spontaneously. Psych: Normal affect.  Labs    Hematology Recent Labs  Lab 07/04/19 1750  WBC 9.1  RBC 6.55*  HGB 15.9  HCT 50.9  MCV 77.7*  MCH 24.3*  MCHC 31.2  RDW 14.5  PLT 312    Chemistry Recent Labs  Lab 07/06/19 0538 07/07/19 0336 07/08/19 0350  NA 136 135 136  K 3.1* 3.4* 3.4*  CL 97* 99 94*  CO2 27 25 29   GLUCOSE 332* 249* 239*  BUN 12 14 18   CREATININE 0.85 0.82 0.89  CALCIUM 8.6* 9.1 9.7  ALBUMIN  --  3.3* 3.4*  GFRNONAA >60 >60 >60  GFRAA >60 >60 >60  ANIONGAP 12 11 13      High Sensitivity Troponin:   Recent Labs  Lab 07/04/19 1750 07/04/19 2036  TROPONINIHS 25* 25*      BNP Recent Labs  Lab 07/04/19 1750  BNP 267.6*   Lab Results  Component Value Date   CHOL 274 (H) 07/07/2019   HDL 34 (L) 07/07/2019   LDLCALC 211 (H) 07/07/2019   TRIG 145 07/07/2019   CHOLHDL 8.1 07/07/2019   Lab Results  Component Value Date   HGBA1C 12.2 (H) 07/05/2019   Lab Results  Component Value Date   TSH 0.527 07/06/2019      Radiology    CT Angio Chest PE W/Cm &/Or Wo Cm  Result Date: 07/04/2019 CLINICAL DATA:  Shortness of breath EXAM: CT ANGIOGRAPHY CHEST WITH CONTRAST TECHNIQUE: Multidetector CT imaging of the chest was performed using the standard protocol during bolus administration of intravenous contrast. Multiplanar CT image reconstructions and MIPs were obtained to evaluate the vascular anatomy. CONTRAST:  OMNIPAQUE IOHEXOL 350 MG/ML SOLN COMPARISON:  07/04/2019 FINDINGS: Cardiovascular: This is a technically adequate evaluation of the pulmonary  vasculature. No filling defects or pulmonary emboli. There is prominent left ventricular and left atrial dilatation. No pericardial effusion. Mild atherosclerosis of the aorta and coronary vessels. No evidence of thoracic aortic aneurysm or dissection. Mediastinum/Nodes: Subcarinal adenopathy measuring up to 14 mm in short axis. Borderline enlarged pretracheal nodes. These are nonspecific. Thyroid, trachea, and esophagus are unremarkable. Lungs/Pleura: There are moderate bilateral pleural effusions, estimated 1 L each. Areas of compressive atelectasis are seen within the dependent lower lobes. No airspace disease or pneumothorax. Central airways are patent. Upper Abdomen: Calcified gallstone is identified.  No cholecystitis. Musculoskeletal: No acute or destructive bony lesions. Reconstructed images demonstrate no additional findings. Review of the MIP images confirms the above findings. IMPRESSION: 1. No evidence of pulmonary embolus. 2. Moderate bilateral pleural effusions and dependent atelectasis. 3. Cardiomegaly, with prominent left atrial and left ventricular dilatation. 4. Likely reactive mediastinal adenopathy. 5.  Aortic Atherosclerosis (ICD10-I70.0). 6. Cholelithiasis. Electronically Signed   By: Sharlet Salina M.D.   On: 07/04/2019 21:58   US PELVIS LIMITED (TRANSABDOMINAL ONLY)  Result Date: 07/05/2019 CLINICAL DATA:  Bulge right inguinal region EXAM: LIMITED ULTRASOUND OF PELVIS TECHNIQUE: Limited transabdominal ultrasound examination of the pelvis was performed. COMPARISON:  10/04/2017 FINDINGS: Sonographic evaluation of the right inguinal region was performed. A fat containing right inguinal hernia is identified. On previous CT, a portion of the bladder dome as well as the appendix extended into the right inguinal hernia. On today's exam, there is equivocal finding of bowel herniation. No free fluid. Normal appearing right inguinal lymph nodes are seen. IMPRESSION: 1. Findings compatible with  chronic known right inguinal hernia. Suspect bowel herniation on today's study. No fluid or inflammatory change. Electronically Signed   By: Sharlet Salina M.D.   On: 07/05/2019 22:13   DG Chest Port 1 View  Result Date: 07/04/2019 CLINICAL DATA:  Shortness of breath EXAM: PORTABLE CHEST 1 VIEW COMPARISON:  None. FINDINGS: There is cardiomegaly with pulmonary venous hypertension. There is diffuse interstitial thickening with suspected interstitial edema. There is atelectatic change in the medial left base with slight airspace opacity. No pleural effusions evident. No adenopathy. No bone lesions. IMPRESSION: Cardiomegaly with pulmonary vascular congestion. There is interstitial thickening, likely due to interstitial edema. Medial left base atelectasis with suspect developing pneumonia or alveolar edema. Overall the appearance is concerning for a degree of congestive heart failure. There may well be superimposed pneumonia in the medial left base. Electronically Signed   By: Bretta Bang III M.D.   On: 07/04/2019 19:10   ECHOCARDIOGRAM COMPLETE  Result Date: 07/06/2019    ECHOCARDIOGRAM  REPORT   Patient Name:   LC JOYNT Date of Exam: 07/06/2019 Medical Rec #:  [id number]   Height:       66.9 in Accession #:    [id number]  Weight:       177.6 lb Date of Birth:  [birth year]    BSA:          1.921 m Patient Age:    54 years    BP:           128/85 mmHg Patient Gender: M           HR:           97 bpm. Exam Location:  Inpatient Procedure: 2D Echo, Cardiac Doppler and Color Doppler Indications:    CHF-Acute Diastolic 428.31 I50.31  History:        Patient has no prior history of Echocardiogram examinations.                 Previous Myocardial Infarction and CAD.  Sonographer:    Roosvelt Maser RDCS Referring Phys: [id number] RONDELL A SMITH IMPRESSIONS  1. Left ventricular ejection fraction, by estimation, is 35 to 40%. The left ventricle has moderately decreased function. The left ventricle demonstrates regional wall  motion abnormalities (see scoring diagram/findings for description). Left ventricular  diastolic parameters are indeterminate.  2. Right ventricular systolic function is normal. The right ventricular size is normal.  3. Left atrial size was moderately dilated.  4. The mitral valve is normal in structure. Mild mitral valve regurgitation. No evidence of mitral stenosis.  5. Indeterminate cusp structure. Given age and amount of calcification, high suspicion for bicuspid valve, either functional or congenital.. The aortic valve has an indeterminant number of cusps. Aortic valve regurgitation is not visualized. Mild to moderate aortic valve sclerosis/calcification is present, without any evidence of aortic stenosis.  6. The inferior vena cava is normal in size with greater than 50% respiratory variability, suggesting right atrial pressure of 3 mmHg. Comparison(s): No prior Echocardiogram. Conclusion(s)/Recommendation(s): Different focal wall motion abnormalities in multiple coronary territories. Nearly global abnormalities with preserved apex, but does not look like takostubo pattern. FINDINGS  Left Ventricle: Left ventricular ejection fraction, by estimation, is 35 to 40%. The left ventricle has moderately decreased function. The left ventricle demonstrates regional wall motion abnormalities. The left ventricular internal cavity size was normal in size. There is no left ventricular hypertrophy. Left ventricular diastolic parameters are indeterminate.  LV Wall Scoring: The anterior wall, antero-lateral wall, anterior septum, entire inferior wall, posterior wall, mid inferoseptal segment, and basal inferoseptal segment are hypokinetic. The apical lateral segment, apical septal segment, apical anterior segment, and apex are normal. Right Ventricle: The right ventricular size is normal. No increase in right ventricular wall thickness. Right ventricular systolic function is normal. Left Atrium: Left atrial size was  moderately dilated. Right Atrium: Right atrial size was normal in size. Pericardium: There is no evidence of pericardial effusion. Mitral Valve: The mitral valve is normal in structure. Mild mitral valve regurgitation. No evidence of mitral valve stenosis. Tricuspid Valve: The tricuspid valve is normal in structure. Tricuspid valve regurgitation is trivial. No evidence of tricuspid stenosis. Aortic Valve: Indeterminate cusp structure. Given age and amount of calcification, high suspicion for bicuspid valve, either functional or congenital. The aortic valve has an indeterminant number of cusps. Aortic valve regurgitation is not visualized. Mild to moderate aortic valve sclerosis/calcification is present, without any evidence of aortic stenosis. There is mild calcification of the aortic valve. Aortic valve  mean gradient measures 10.0 mmHg. Aortic valve peak gradient measures 16.9 mmHg. Aortic valve area, by VTI measures 0.99 cm. Pulmonic Valve: The pulmonic valve was grossly normal. Pulmonic valve regurgitation is not visualized. No evidence of pulmonic stenosis. Aorta: The aortic root and ascending aorta are structurally normal, with no evidence of dilitation. Pulmonary Artery: The pulmonary artery is not well seen. Venous: The inferior vena cava is normal in size with greater than 50% respiratory variability, suggesting right atrial pressure of 3 mmHg. IAS/Shunts: The atrial septum is grossly normal.  LEFT VENTRICLE PLAX 2D LVIDd:         4.60 cm      Diastology LVIDs:         3.90 cm      LV e' lateral:   9.57 cm/s LV PW:         1.10 cm      LV E/e' lateral: 8.3 LV IVS:        1.10 cm      LV e' medial:    7.51 cm/s LVOT diam:     2.20 cm      LV E/e' medial:  10.6 LV SV:         33 LV SV Index:   17 LVOT Area:     3.80 cm                              3D Volume EF: LV Volumes (MOD)            3D EF:        47 % LV vol d, MOD A2C: 121.0 ml LV EDV:       158 ml LV vol d, MOD A4C: 127.0 ml LV ESV:       84 ml LV  vol s, MOD A2C: 78.2 ml  LV SV:        74 ml LV vol s, MOD A4C: 71.1 ml LV SV MOD A2C:     42.8 ml LV SV MOD A4C:     127.0 ml LV SV MOD BP:      50.5 ml RIGHT VENTRICLE            IVC RV Basal diam:  3.30 cm    IVC diam: 1.50 cm RV S prime:     9.79 cm/s TAPSE (M-mode): 2.2 cm LEFT ATRIUM           Index       RIGHT ATRIUM           Index LA diam:      4.40 cm 2.29 cm/m  RA Area:     15.10 cm LA Vol (A2C): 95.0 ml 49.45 ml/m RA Volume:   37.10 ml  19.31 ml/m  AORTIC VALVE AV Area (Vmax):    1.05 cm AV Area (Vmean):   1.02 cm AV Area (VTI):     0.99 cm AV Vmax:           205.50 cm/s AV Vmean:          151.000 cm/s AV VTI:            0.334 m AV Peak Grad:      16.9 mmHg AV Mean Grad:      10.0 mmHg LVOT Vmax:         56.80 cm/s LVOT Vmean:        40.700 cm/s LVOT VTI:  0.087 m LVOT/AV VTI ratio: 0.26  AORTA Ao Root diam: 3.70 cm Ao Asc diam:  3.40 cm MITRAL VALVE MV Area (PHT): 5.54 cm    SHUNTS MV Decel Time: 137 msec    Systemic VTI:  0.09 m MV E velocity: 79.30 cm/s  Systemic Diam: 2.20 cm MV A velocity: 48.00 cm/s MV E/A ratio:  1.65 Buford Dresser MD Electronically signed by Buford Dresser MD Signature Date/Time: 07/06/2019/3:50:01 PM    Final      Cardiac Studies   ECHO:    Patient Profile     54 y.o. male w/ hx  CAD (2 stents placed 2007 in [place], [place]), DM, hypertension, hyperlipidemia, asthma, tobacco use, anxiety was admitted 04/16 w/ SOB, CHF, uncontrolled CRFs  Assessment & Plan    1. Acute combined S-D-CHF - Pt says he used to weigh 200 lbs, does not have scales - wt down 3 lbs since admit, I/O net - 3.9 L - sx much improved - K+ low w/ diuresis, got 80 meq 04/18, has that ordered. Will give add'l 40 meq - needs R/L cath to eval, feel we can do that tomorrow.  2. HLD - LDL >200, Lipitor increased to 80 mg qd - recheck L&L 12 weeks.  Otherwise, per IM, needs better DM control Principal Problem:   Acute respiratory failure with hypoxia (HCC) Active  Problems:   Acute exacerbation of CHF (congestive heart failure) ([place])   Uncontrolled type 2 diabetes mellitus with hyperglycemia (HCC)   Tobacco abuse   Bilateral pleural effusion   Signed, Rosaria Ferries , PA-C 10:00 AM 07/08/2019 Pager: [phone]  Patient seen and examined.  Agree with above documentation.  On exam, patient is alert and oriented, regular rate and rhythm, no murmurs, lungs CTAB, no LE edema or JVD.  Urine output not recorded yesterday.  Creatinine stable at 0.9.  He reports feeling symptomatically improved, states that dyspnea has improved and he denies any chest pain.  Suspect ischemic cardiomyopathy.  Plan RHC/LHC tomorrow to work-up systolic heart failure.   Risks and benefits of cardiac catheterization have been discussed with the patient.  These include bleeding, infection, kidney damage, stroke, heart attack, death.  The patient understands these risks and is willing to proceed.  Donato Heinz, MD

## 2019-07-08 NOTE — Progress Notes (Signed)
Inpatient Diabetes Program Recommendations  AACE/ADA: New Consensus Statement on Inpatient Glycemic Control (2015)  Target Ranges:  Prepandial:   less than 140 mg/dL      Peak postprandial:   less than 180 mg/dL (1-2 hours)      Critically ill patients:  140 - 180 mg/dL   Lab Results  Component Value Date   GLUCAP 315 (H) 07/08/2019   HGBA1C 12.2 (H) 07/05/2019    Review of Glycemic Control  Inpatient Diabetes Program Recommendations:   Met with patient @ bedside. Patient shared he spoke with someone here in the hospital concerning finances and attempting to get access to Medicaid. Patient states he was let go @ work due to being short of breath one day @ work, his wife passed away 4 months ago, has no family in the area, and has no income. Patient lost approximately 45 lbs over the past year without intentionally trying to lose.  Spoke with pt about  A1C 12.2 (average CBG over the past 2-3 months = 303) results with them and explained what an A1C is, basic pathophysiology of DM Type 2, basic home care, basic diabetes diet nutrition principles, importance of checking CBGs and maintaining good CBG control to prevent long-term and short-term complications. Reviewed signs and symptoms of hyperglycemia and hypoglycemia and how to treat hypoglycemia at home. Also reviewed blood sugar goals at home.  RNs to provide ongoing basic DM education at bedside with this patient. Have ordered educational booklet, insulin starter kit, and DM videos.   Educated patient on insulin pen use at home. Reviewed contents of insulin flexpen starter kit. Reviewed all steps if insulin pen including attachment of needle, 2-unit air shot, dialing up dose, giving injection, removing needle, disposal of sharps, storage of unused insulin, disposal of insulin etc.   Nurses, please continue insulin syring and insulin pen teaching and allow patient to give own injections while in the hospital.  Thank you, Nani Gasser. Aala Ransom,  RN, MSN, CDE  Diabetes Coordinator Inpatient Glycemic Control Team Team Pager [phone] (8am-5pm) 07/08/2019 4:06 PM

## 2019-07-09 ENCOUNTER — Encounter (HOSPITAL_COMMUNITY)
Admission: EM | Disposition: A | Discharge status: Home / Self Care | Payer: Self-pay | Source: Home / Self Care | Attending: Student

## 2019-07-09 DIAGNOSIS — I25119 Atherosclerotic heart disease of native coronary artery with unspecified angina pectoris: Secondary | ICD-10-CM

## 2019-07-09 DIAGNOSIS — I25118 Atherosclerotic heart disease of native coronary artery with other forms of angina pectoris: Secondary | ICD-10-CM

## 2019-07-09 DIAGNOSIS — I509 Heart failure, unspecified: Secondary | ICD-10-CM

## 2019-07-09 DIAGNOSIS — I2510 Atherosclerotic heart disease of native coronary artery without angina pectoris: Secondary | ICD-10-CM

## 2019-07-09 HISTORY — PX: RIGHT/LEFT HEART CATH AND CORONARY ANGIOGRAPHY: CATH118266

## 2019-07-09 LAB — POCT I-STAT EG7
Acid-Base Excess: 2 mmol/L (ref 0.0–2.0)
Acid-Base Excess: 4 mmol/L — ABNORMAL HIGH (ref 0.0–2.0)
Bicarbonate: 28.8 mmol/L — ABNORMAL HIGH (ref 20.0–28.0)
Bicarbonate: 30.1 mmol/L — ABNORMAL HIGH (ref 20.0–28.0)
Calcium, Ion: 1.22 mmol/L (ref 1.15–1.40)
Calcium, Ion: 1.25 mmol/L (ref 1.15–1.40)
HCT: 50 % (ref 39.0–52.0)
HCT: 50 % (ref 39.0–52.0)
Hemoglobin: 17 g/dL (ref 13.0–17.0)
Hemoglobin: 17 g/dL (ref 13.0–17.0)
O2 Saturation: 71 %
O2 Saturation: 71 %
Potassium: 3.9 mmol/L (ref 3.5–5.1)
Potassium: 4.1 mmol/L (ref 3.5–5.1)
Sodium: 141 mmol/L (ref 135–145)
Sodium: 141 mmol/L (ref 135–145)
TCO2: 30 mmol/L (ref 22–32)
TCO2: 32 mmol/L (ref 22–32)
pCO2, Ven: 49.6 mmHg (ref 44.0–60.0)
pCO2, Ven: 50.8 mmHg (ref 44.0–60.0)
pH, Ven: 7.371 (ref 7.250–7.430)
pH, Ven: 7.38 (ref 7.250–7.430)
pO2, Ven: 39 mmHg (ref 32.0–45.0)
pO2, Ven: 39 mmHg (ref 32.0–45.0)

## 2019-07-09 LAB — POCT I-STAT 7, (LYTES, BLD GAS, ICA,H+H)
Acid-Base Excess: 2 mmol/L (ref 0.0–2.0)
Bicarbonate: 27.7 mmol/L (ref 20.0–28.0)
Calcium, Ion: 1.24 mmol/L (ref 1.15–1.40)
HCT: 48 % (ref 39.0–52.0)
Hemoglobin: 16.3 g/dL (ref 13.0–17.0)
O2 Saturation: 95 %
Potassium: 3.7 mmol/L (ref 3.5–5.1)
Sodium: 138 mmol/L (ref 135–145)
TCO2: 29 mmol/L (ref 22–32)
pCO2 arterial: 47 mmHg (ref 32.0–48.0)
pH, Arterial: 7.378 (ref 7.350–7.450)
pO2, Arterial: 76 mmHg — ABNORMAL LOW (ref 83.0–108.0)

## 2019-07-09 LAB — RENAL FUNCTION PANEL
Albumin: 3.4 g/dL — ABNORMAL LOW (ref 3.5–5.0)
Anion gap: 11 (ref 5–15)
BUN: 18 mg/dL (ref 6–20)
CO2: 25 mmol/L (ref 22–32)
Calcium: 9.4 mg/dL (ref 8.9–10.3)
Chloride: 101 mmol/L (ref 98–111)
Creatinine, Ser: 0.81 mg/dL (ref 0.61–1.24)
GFR calc Af Amer: >60 mL/min (ref >60)
GFR calc non Af Amer: >60 mL/min (ref >60)
Glucose, Bld: 180 mg/dL — ABNORMAL HIGH (ref 70–99)
Phosphorus: 4.5 mg/dL (ref 2.5–4.6)
Potassium: 4.3 mmol/L (ref 3.5–5.1)
Sodium: 137 mmol/L (ref 135–145)

## 2019-07-09 LAB — GLUCOSE, CAPILLARY
Glucose-Capillary: 220 mg/dL — ABNORMAL HIGH (ref 70–99)
Glucose-Capillary: 210 mg/dL — ABNORMAL HIGH (ref 70–99)
Glucose-Capillary: 158 mg/dL — ABNORMAL HIGH (ref 70–99)
Glucose-Capillary: 254 mg/dL — ABNORMAL HIGH (ref 70–99)
Glucose-Capillary: 257 mg/dL — ABNORMAL HIGH (ref 70–99)

## 2019-07-09 LAB — CBC
HCT: 50.5 % (ref 39.0–52.0)
Hemoglobin: 15.9 g/dL (ref 13.0–17.0)
MCH: 24.1 pg — ABNORMAL LOW (ref 26.0–34.0)
MCHC: 31.5 g/dL (ref 30.0–36.0)
MCV: 76.6 fL — ABNORMAL LOW (ref 80.0–100.0)
Platelets: 298 10*3/uL (ref 150–400)
RBC: 6.59 MIL/uL — ABNORMAL HIGH (ref 4.22–5.81)
RDW: 13.3 % (ref 11.5–15.5)
WBC: 9.2 10*3/uL (ref 4.0–10.5)
nRBC: 0 % (ref 0.0–0.2)

## 2019-07-09 LAB — MAGNESIUM: Magnesium: 2 mg/dL (ref 1.7–2.4)

## 2019-07-09 SURGERY — RIGHT/LEFT HEART CATH AND CORONARY ANGIOGRAPHY
Anesthesia: LOCAL

## 2019-07-09 MED ORDER — ENOXAPARIN SODIUM 40 MG/0.4ML ~~LOC~~ SOLN
40.0000 mg | SUBCUTANEOUS | Status: DC
  Administered 2019-07-10 – 2019-07-11 (×2): 40 mg via SUBCUTANEOUS
  Filled 2019-07-09 (×2): qty 0.4

## 2019-07-09 MED ORDER — SODIUM CHLORIDE 0.9 % IV SOLN: INTRAVENOUS | Status: AC

## 2019-07-09 MED ORDER — HEPARIN SODIUM (PORCINE) 1000 UNIT/ML IJ SOLN
INTRAMUSCULAR | Status: DC | PRN
  Administered 2019-07-09: 4000 [IU] via INTRAVENOUS

## 2019-07-09 MED ORDER — SODIUM CHLORIDE 0.9% FLUSH
3.0000 mL | Freq: Two times a day (BID) | INTRAVENOUS | Status: DC
  Administered 2019-07-10 – 2019-07-11 (×3): 3 mL via INTRAVENOUS

## 2019-07-09 MED ORDER — VERAPAMIL HCL 2.5 MG/ML IV SOLN
INTRAVENOUS | Status: AC
  Filled 2019-07-09: qty 2

## 2019-07-09 MED ORDER — DIAZEPAM 5 MG PO TABS: 5.0000 mg | ORAL_TABLET | Freq: Four times a day (QID) | ORAL | Status: DC | PRN

## 2019-07-09 MED ORDER — LIDOCAINE HCL (PF) 1 % IJ SOLN
INTRAMUSCULAR | Status: DC | PRN
  Administered 2019-07-09 (×2): 2 mL

## 2019-07-09 MED ORDER — MIDAZOLAM HCL 2 MG/2ML IJ SOLN
INTRAMUSCULAR | Status: AC
  Filled 2019-07-09: qty 2

## 2019-07-09 MED ORDER — ASPIRIN EC 81 MG PO TBEC
81.0000 mg | DELAYED_RELEASE_TABLET | Freq: Every day | ORAL | Status: DC
  Administered 2019-07-10 – 2019-07-11 (×2): 81 mg via ORAL
  Filled 2019-07-09 (×2): qty 1

## 2019-07-09 MED ORDER — IOHEXOL 350 MG/ML SOLN
INTRAVENOUS | Status: DC | PRN
  Administered 2019-07-09: 60 mL via INTRA_ARTERIAL

## 2019-07-09 MED ORDER — HYDRALAZINE HCL 20 MG/ML IJ SOLN: 10.0000 mg | INTRAMUSCULAR | Status: AC | PRN

## 2019-07-09 MED ORDER — SODIUM CHLORIDE 0.9% FLUSH: 3.0000 mL | INTRAVENOUS | Status: DC | PRN

## 2019-07-09 MED ORDER — HEPARIN (PORCINE) IN NACL 1000-0.9 UT/500ML-% IV SOLN
INTRAVENOUS | Status: AC
  Filled 2019-07-09: qty 500

## 2019-07-09 MED ORDER — FENTANYL CITRATE (PF) 100 MCG/2ML IJ SOLN
INTRAMUSCULAR | Status: DC | PRN
  Administered 2019-07-09: 50 ug via INTRAVENOUS
  Administered 2019-07-09: 25 ug via INTRAVENOUS

## 2019-07-09 MED ORDER — INSULIN GLARGINE 100 UNIT/ML ~~LOC~~ SOLN
25.0000 [IU] | Freq: Two times a day (BID) | SUBCUTANEOUS | Status: DC
  Administered 2019-07-09 – 2019-07-11 (×4): 25 [IU] via SUBCUTANEOUS
  Filled 2019-07-09 (×5): qty 0.25

## 2019-07-09 MED ORDER — CARVEDILOL 3.125 MG PO TABS
3.1250 mg | ORAL_TABLET | Freq: Two times a day (BID) | ORAL | Status: DC
  Administered 2019-07-09 – 2019-07-10 (×2): 3.125 mg via ORAL
  Filled 2019-07-09 (×2): qty 1

## 2019-07-09 MED ORDER — SODIUM CHLORIDE 0.9 % IV SOLN: 250.0000 mL | INTRAVENOUS | Status: DC | PRN

## 2019-07-09 MED ORDER — HEPARIN (PORCINE) IN NACL 1000-0.9 UT/500ML-% IV SOLN
INTRAVENOUS | Status: DC | PRN
  Administered 2019-07-09 (×2): 500 mL

## 2019-07-09 MED ORDER — ACETAMINOPHEN 325 MG PO TABS
650.0000 mg | ORAL_TABLET | ORAL | Status: DC | PRN
  Filled 2019-07-09: qty 2

## 2019-07-09 MED ORDER — LABETALOL HCL 5 MG/ML IV SOLN: 10.0000 mg | INTRAVENOUS | Status: AC | PRN

## 2019-07-09 MED ORDER — INSULIN GLARGINE 100 UNIT/ML ~~LOC~~ SOLN
20.0000 [IU] | Freq: Two times a day (BID) | SUBCUTANEOUS | Status: DC
  Filled 2019-07-09: qty 0.2

## 2019-07-09 MED ORDER — VERAPAMIL HCL 2.5 MG/ML IV SOLN
INTRAVENOUS | Status: DC | PRN
  Administered 2019-07-09: 12:00:00 10 mL via INTRA_ARTERIAL

## 2019-07-09 MED ORDER — ONDANSETRON HCL 4 MG/2ML IJ SOLN: 4.0000 mg | Freq: Four times a day (QID) | INTRAMUSCULAR | Status: DC | PRN

## 2019-07-09 MED ORDER — FENTANYL CITRATE (PF) 100 MCG/2ML IJ SOLN
INTRAMUSCULAR | Status: AC
  Filled 2019-07-09: qty 2

## 2019-07-09 MED ORDER — CLOPIDOGREL BISULFATE 75 MG PO TABS
75.0000 mg | ORAL_TABLET | Freq: Every day | ORAL | Status: DC
  Administered 2019-07-09 – 2019-07-11 (×3): 75 mg via ORAL
  Filled 2019-07-09 (×3): qty 1

## 2019-07-09 MED ORDER — MIDAZOLAM HCL 2 MG/2ML IJ SOLN
INTRAMUSCULAR | Status: DC | PRN
  Administered 2019-07-09: 1 mg via INTRAVENOUS
  Administered 2019-07-09: 2 mg via INTRAVENOUS

## 2019-07-09 SURGICAL SUPPLY — 10 items
CATH BALLN WEDGE 5F 110CM (CATHETERS) ×1 IMPLANT
CATH OPTITORQUE TIG 4.0 5F (CATHETERS) ×1 IMPLANT
GLIDESHEATH SLEND SS 6F .021 (SHEATH) ×1 IMPLANT
GUIDEWIRE INQWIRE 1.5J.035X260 (WIRE) IMPLANT
INQWIRE 1.5J .035X260CM (WIRE) ×2
KIT HEART LEFT (KITS) ×2 IMPLANT
PACK CARDIAC CATHETERIZATION (CUSTOM PROCEDURE TRAY) ×2 IMPLANT
SHEATH GLIDE SLENDER 4/5FR (SHEATH) ×1 IMPLANT
TRANSDUCER W/STOPCOCK (MISCELLANEOUS) ×2 IMPLANT
TUBING CIL FLEX 10 FLL-RA (TUBING) ×2 IMPLANT

## 2019-07-09 NOTE — Progress Notes (Addendum)
Progress Note  Patient Name: Tenzin Edelman Date of Encounter: 07/09/2019  Primary Cardiologist: Shirlyn Goltz, MD  Subjective   Is tired and stressed. The nurses trying to get him to give himself insulin was very stressful for him. It upset him.  No chest pain or SOB.   Inpatient Medications    Scheduled Meds: . aspirin  81 mg Oral Daily  . atorvastatin  80 mg Oral Daily  . enoxaparin (LOVENOX) injection  40 mg Subcutaneous Q24H  . insulin aspart  0-20 Units Subcutaneous TID WC  . insulin aspart  0-5 Units Subcutaneous QHS  . insulin aspart  8 Units Subcutaneous TID WC  . insulin glargine  20 Units Subcutaneous BID  . nicotine  21 mg Transdermal Daily  . pneumococcal 23 valent vaccine  0.5 mL Intramuscular Tomorrow-1000  . Ensure Max Protein  11 oz Oral TID  . sodium chloride flush  3 mL Intravenous Q12H  . sodium chloride flush  3 mL Intravenous Q12H  . spironolactone  25 mg Oral Daily   Continuous Infusions: . sodium chloride    . sodium chloride 50 mL/hr at 07/09/19 0614  . sodium chloride     PRN Meds: sodium chloride, sodium chloride, acetaminophen, nitroGLYCERIN, ondansetron (ZOFRAN) IV, sodium chloride flush, sodium chloride flush   Vital Signs    Vitals:   07/08/19 1334 07/08/19 2133 07/09/19 0537 07/09/19 0800  BP: 113/83 120/87 110/87 117/87  Pulse: 100 (!) 106    Resp: 17 18 18    Temp: 98.7 F (37.1 C) 98.2 F (36.8 C) 98.1 F (36.7 C)   TempSrc: Oral Oral Oral   SpO2: 98% 95% 92% 98%  Weight:   79.9 kg   Height:        Intake/Output Summary (Last 24 hours) at 07/09/2019 0901 Last data filed at 07/09/2019 0650 Gross per 24 hour  Intake 1097.14 ml  Output 850 ml  Net 247.14 ml   Filed Weights   07/07/19 0511 07/08/19 0354 07/09/19 0537  Weight: 81 kg 79.4 kg 79.9 kg   Last Weight  Most recent update: 07/09/2019  6:08 AM   Weight  79.9 kg (176 lb 3.2 oz)           Weight change: 0.544 kg   Telemetry    SR, ST - Personally  Reviewed  ECG    None today - Personally Reviewed  Physical Exam   GEN: No acute distress.   Neck: No JVD Cardiac: RRR, soft murmur, no rubs, or gallops.  Respiratory: Clear bilaterally GI: Soft, nontender, non-distended  MS: No edema; No deformity. Neuro:  Nonfocal  Psych: Normal affect   Labs    Hematology Recent Labs  Lab 07/04/19 1750 07/09/19 0435  WBC 9.1 9.2  RBC 6.55* 6.59*  HGB 15.9 15.9  HCT 50.9 50.5  MCV 77.7* 76.6*  MCH 24.3* 24.1*  MCHC 31.2 31.5  RDW 14.5 13.3  PLT 312 298    Chemistry Recent Labs  Lab 07/07/19 0336 07/08/19 0350 07/09/19 0435  NA 135 136 137  K 3.4* 3.4* 4.3  CL 99 94* 101  CO2 25 29 25   GLUCOSE 249* 239* 180*  BUN 14 18 18   CREATININE 0.82 0.89 0.81  CALCIUM 9.1 9.7 9.4  ALBUMIN 3.3* 3.4* 3.4*  GFRNONAA >60 >60 >60  GFRAA >60 >60 >60  ANIONGAP 11 13 11      High Sensitivity Troponin:   Recent Labs  Lab 07/04/19 1750 07/04/19 2036  TROPONINIHS 25*  25*      BNP Recent Labs  Lab 07/04/19 1750  BNP 267.6*   Lab Results  Component Value Date   CHOL 274 (H) 07/07/2019   HDL 34 (L) 07/07/2019   LDLCALC 211 (H) 07/07/2019   TRIG 145 07/07/2019   CHOLHDL 8.1 07/07/2019   Lab Results  Component Value Date   HGBA1C 12.2 (H) 07/05/2019   Lab Results  Component Value Date   TSH 0.527 07/06/2019      Radiology    US PELVIS LIMITED (TRANSABDOMINAL ONLY)  Result Date: 07/05/2019 CLINICAL DATA:  Bulge right inguinal region EXAM: LIMITED ULTRASOUND OF PELVIS TECHNIQUE: Limited transabdominal ultrasound examination of the pelvis was performed. COMPARISON:  10/04/2017 FINDINGS: Sonographic evaluation of the right inguinal region was performed. A fat containing right inguinal hernia is identified. On previous CT, a portion of the bladder dome as well as the appendix extended into the right inguinal hernia. On today's exam, there is equivocal finding of bowel herniation. No free fluid. Normal appearing right  inguinal lymph nodes are seen. IMPRESSION: 1. 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 Suspect bowel herniation on today's study. No fluid or inflammatory change. Electronically Signed   By: Sharlet Salina M.D.   On: 07/05/2019 22:13   ECHOCARDIOGRAM COMPLETE  Result Date: 07/06/2019    ECHOCARDIOGRAM REPORT   Patient Name:   ROSHAD HACK Date of Exam: 07/06/2019 Medical Rec #:  [id number]   Height:       66.9 in Accession #:    [id number]  Weight:       177.6 lb Date of Birth:  22-Oct-1965    BSA:          1.921 m Patient Age:    54 years    BP:           128/85 mmHg Patient Gender: M           HR:           97 bpm. Exam Location:  Inpatient Procedure: 2D Echo, Cardiac Doppler and Color Doppler Indications:    CHF-Acute Diastolic 428.31 I50.31  History:        Patient has no prior history of Echocardiogram examinations.                 Previous Myocardial Infarction and CAD.  Sonographer:    Roosvelt Maser RDCS Referring Phys: [id number] RONDELL A SMITH IMPRESSIONS  1. Left ventricular ejection fraction, by estimation, is 35 to 40%. The left ventricle has moderately decreased function. The left ventricle demonstrates regional wall motion abnormalities (see scoring diagram/findings for description). Left ventricular  diastolic parameters are indeterminate.  2. Right ventricular systolic function is normal. The right ventricular size is normal.  3. Left atrial size was moderately dilated.  4. The mitral valve is normal in structure. Mild mitral valve regurgitation. No evidence of mitral stenosis.  5. Indeterminate cusp structure. Given age and amount of calcification, high suspicion for bicuspid valve, either functional or congenital.. The aortic valve has an indeterminant number of cusps. Aortic valve regurgitation is not visualized. Mild to moderate aortic valve sclerosis/calcification is present, without any evidence of aortic stenosis.  6. The inferior vena cava is normal in size with greater than  50% respiratory variability, suggesting right atrial pressure of 3 mmHg. Comparison(s): No prior Echocardiogram. Conclusion(s)/Recommendation(s): Different focal wall motion abnormalities in multiple coronary territories. Nearly global abnormalities with preserved apex, but does not look like takostubo pattern. FINDINGS  Left Ventricle: Left  ventricular ejection fraction, by estimation, is 35 to 40%. The left ventricle has moderately decreased function. The left ventricle demonstrates regional wall motion abnormalities. The left ventricular internal cavity size was normal in size. There is no left ventricular hypertrophy. Left ventricular diastolic parameters are indeterminate.  LV Wall Scoring: The anterior wall, antero-lateral wall, anterior septum, entire inferior wall, posterior wall, mid inferoseptal segment, and basal inferoseptal segment are hypokinetic. The apical lateral segment, apical septal segment, apical anterior segment, and apex are normal. Right Ventricle: The right ventricular size is normal. No increase in right ventricular wall thickness. Right ventricular systolic function is normal. Left Atrium: Left atrial size was moderately dilated. Right Atrium: Right atrial size was normal in size. Pericardium: There is no evidence of pericardial effusion. Mitral Valve: The mitral valve is normal in structure. Mild mitral valve regurgitation. No evidence of mitral valve stenosis. Tricuspid Valve: The tricuspid valve is normal in structure. Tricuspid valve regurgitation is trivial. No evidence of tricuspid stenosis. Aortic Valve: Indeterminate cusp structure. Given age and amount of calcification, high suspicion for bicuspid valve, either functional or congenital. The aortic valve has an indeterminant number of cusps. Aortic valve regurgitation is not visualized. Mild to moderate aortic valve sclerosis/calcification is present, without any evidence of aortic stenosis. There is mild calcification of the  aortic valve. Aortic valve mean gradient measures 10.0 mmHg. Aortic valve peak gradient measures 16.9 mmHg. Aortic valve area, by VTI measures 0.99 cm. Pulmonic Valve: The pulmonic valve was grossly normal. Pulmonic valve regurgitation is not visualized. No evidence of pulmonic stenosis. Aorta: The aortic root and ascending aorta are structurally normal, with no evidence of dilitation. Pulmonary Artery: The pulmonary artery is not well seen. Venous: The inferior vena cava is normal in size with greater than 50% respiratory variability, suggesting right atrial pressure of 3 mmHg. IAS/Shunts: The atrial septum is grossly normal.  LEFT VENTRICLE PLAX 2D LVIDd:         4.60 cm      Diastology LVIDs:         3.90 cm      LV e' lateral:   9.57 cm/s LV PW:         1.10 cm      LV E/e' lateral: 8.3 LV IVS:        1.10 cm      LV e' medial:    7.51 cm/s LVOT diam:     2.20 cm      LV E/e' medial:  10.6 LV SV:         33 LV SV Index:   17 LVOT Area:     3.80 cm                              3D Volume EF: LV Volumes (MOD)            3D EF:        47 % LV vol d, MOD A2C: 121.0 ml LV EDV:       158 ml LV vol d, MOD A4C: 127.0 ml LV ESV:       84 ml LV vol s, MOD A2C: 78.2 ml  LV SV:        74 ml LV vol s, MOD A4C: 71.1 ml LV SV MOD A2C:     42.8 ml LV SV MOD A4C:     127.0 ml LV SV MOD BP:  50.5 ml RIGHT VENTRICLE            IVC RV Basal diam:  3.30 cm    IVC diam: 1.50 cm RV S prime:     9.79 cm/s TAPSE (M-mode): 2.2 cm LEFT ATRIUM           Index       RIGHT ATRIUM           Index LA diam:      4.40 cm 2.29 cm/m  RA Area:     15.10 cm LA Vol (A2C): 95.0 ml 49.45 ml/m RA Volume:   37.10 ml  19.31 ml/m  AORTIC VALVE AV Area (Vmax):    1.05 cm AV Area (Vmean):   1.02 cm AV Area (VTI):     0.99 cm AV Vmax:           205.50 cm/s AV Vmean:          151.000 cm/s AV VTI:            0.334 m AV Peak Grad:      16.9 mmHg AV Mean Grad:      10.0 mmHg LVOT Vmax:         56.80 cm/s LVOT Vmean:        40.700 cm/s LVOT VTI:           0.087 m LVOT/AV VTI ratio: 0.26  AORTA Ao Root diam: 3.70 cm Ao Asc diam:  3.40 cm MITRAL VALVE MV Area (PHT): 5.54 cm    SHUNTS MV Decel Time: 137 msec    Systemic VTI:  0.09 m MV E velocity: 79.30 cm/s  Systemic Diam: 2.20 cm MV A velocity: 48.00 cm/s MV E/A ratio:  1.65 Jodelle RedBridgette Legrand Lasser MD Electronically signed by Jodelle RedBridgette Eddie Koc MD Signature Date/Time: 07/06/2019/3:50:01 PM    Final      Cardiac Studies   ECHO:  07/06/2019 1. Left ventricular ejection fraction, by estimation, is 35 to 40%. The  left ventricle has moderately decreased function. The left ventricle  demonstrates regional wall motion abnormalities (see scoring  diagram/findings for description). Left ventricular  diastolic parameters are indeterminate.  2. Right ventricular systolic function is normal. The right ventricular  size is normal.  3. Left atrial size was moderately dilated.  4. The mitral valve is normal in structure. Mild mitral valve  regurgitation. No evidence of mitral stenosis.  5. Indeterminate cusp structure. Given age and amount of calcification,  high suspicion for bicuspid valve, either functional or congenital.. The  aortic valve has an indeterminant number of cusps. Aortic valve  regurgitation is not visualized. Mild to  moderate aortic valve sclerosis/calcification is present, without any  evidence of aortic stenosis.  6. The inferior vena cava is normal in size with greater than 50%  respiratory variability, suggesting right atrial pressure of 3 mmHg.   Patient Profile     54 y.o. male w/ hx  [place] DM, hypertension, hyperlipidemia, asthma, tobacco use, anxiety was admitted 04/16 w/ SOB, CHF, uncontrolled CRFs  Assessment & Plan    1. Acute combined S-D-CHF - volume status good by exam - now off Lasix, hold spiro today for cath - sx improved - K+ supplemented and improved - for R/L cath to eval for CAD and pressures  2. HLD - on  high-dose statin - recheck  L&L 6-12 weeks  Otherwise, per IM, Pt did not do well giving himself insulin Principal Problem:   Acute respiratory  failure with hypoxia (HCC) Active Problems:   Acute exacerbation of CHF (congestive heart failure) (HCC)   Uncontrolled type 2 diabetes mellitus with hyperglycemia (HCC)   Tobacco abuse   Bilateral pleural effusion   Signed, Theodore Demark , PA-C 9:01 AM 07/09/2019 Pager: [phone]  Patient seen and examined.  Agree with above documentation.On exam, patient is alert and oriented, regular rate and rhythm, no murmurs, lungs CTAB, no LE edema or JVD.  Telemetry personally reviewed and shows normal sinus rhythm with rate 90s.  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  Not amenable to intervention, medical therapy recommended.  Normal right heart pressures.  Currently on aspirin 81 mg, Lipitor 80 mg, Coreg 3.125 mg, Aldactone 25 mg.  Will add Plavix 75 mg daily.  If renal function stable tomorrow, will start low-dose Entresto.  Smoking cessation strongly recommended.  Little Ishikawa, MD

## 2019-07-09 NOTE — Progress Notes (Signed)
PROGRESS NOTE  Larry Lewis:[id number] DOB: 04-22-1965   PCP: Patient, No Pcp Per.  Patient has no PCP.  Patient is from: Home.  DOA: 07/04/2019 LOS: 4  Brief Narrative / Interim history: 54 year old male with history of HTN, HLD, CAD, asthma, tobacco use disorder, right inguinal hernia and anxiety presenting with progressive shortness of breath, orthopnea, UTI symptoms and insomnia, and admitted with acute unknown type CHF.  In ED, 100-1 110s.  RR 20s to 30s.  Desaturated to 88% with ambulation on RA requiring 4 L to recover.  Glucose 332.  MCV 77.7.  Otherwise, CBC and BMP without significant finding.  BNP 267. HS Trop 25x2.  EKG with sinus tachycardia, J-point elevation in anterior leads and T wave changes in lateral leads. CXR with cardiomegaly and pulmonary vascular congestion.  COVID-19 PCR and influenza PCR negative.  CTA chest negative for PE but moderate bilateral pleural effusions, dependent atelectasis, cardiomegaly with prominent LA and LV dilation, mediastinal adenopathy and cholelithiasis.  Started on IV Lasix and 10 units of insulin and admitted for CHF exacerbation.  Continued on IV Lasix 40 mg twice daily.    TTE with EF of 35 to 40%, RWMA, indeterminate diastolic functions, moderate LAE with moderate aortic valve sclerosis/calcification and indeterminate cuspid structures.  Cardiology consulted on 4/18.  R/LHC on 4/20 with diffuse CAD and normal right heart pressure.  See details below.  Subjective: Seen and examined earlier this morning before he went for heart catheterization.  No major events overnight or this morning.  No complaints.  Denies chest pain, dyspnea, GI or UTI symptoms.  Reports making good urine which does not seem to be captured well.  Objective: Vitals:   07/09/19 1230 07/09/19 1310 07/09/19 1554 07/09/19 1609  BP: 114/82 (!) 115/92  105/75  Pulse:  90  99  Resp:  16    Temp:  (!) 97.5 F (36.4 C)    TempSrc:  Oral    SpO2: 93% 98% 96%   Weight:       Height:        Intake/Output Summary (Last 24 hours) at 07/09/2019 1659 Last data filed at 07/09/2019 1600 Gross per 24 hour  Intake 1435.75 ml  Output 950 ml  Net 485.75 ml   Filed Weights   07/07/19 0511 07/08/19 0354 07/09/19 0537  Weight: 81 kg 79.4 kg 79.9 kg    Examination:  GENERAL: No apparent distress.  Nontoxic. HEENT: MMM.  Vision and hearing grossly intact.  NECK: Supple.  No apparent JVD.  RESP: On room air.  No IWOB.  Fair aeration bilaterally. CVS:  RRR. Heart sounds normal.  ABD/GI/GU: Bowel sounds present. Soft. Non tender.  MSK/EXT:  Moves extremities. No apparent deformity. No edema.  SKIN: no apparent skin lesion or wound NEURO: Awake, alert and oriented appropriately.  No apparent focal neuro deficit. PSYCH: Calm. Normal affect.   Procedures:  4/20-R/LHC with diffuse CAD and normal right heart pressure.  Microbiology summarized: COVID-19 PCR negative. Influenza A/B PCR negative.  Assessment & Plan: Acute respiratory failure with hypoxia due to unknown type and acute combined CHF: TTE with EF of 35 to 40%, RWMA, indeterminate diastolic functions, moderate LAE with moderate aortic valve sclerosis/calcification and indeterminate cuspid structures.  No previous echo in chart. CXR, CTA and BNP consistent with acute CHF.  R/LHC as above.  Responding to IV Lasix clinically.  UOP not captured well.  Weight does not seem accurate.  Renal function relatively stable. -Cardiology managing. -Continue IV Lasix 40  mg twice daily -Aldactone on hold for for heart cauterization. -Monitor fluid status, renal function and electrolytes.  Elevated troponin/history of CAD s/p stents in 2007: Likely demand ischemia due to acute CHF.  EKG with sinus tachycardia, J-point elevation in anterior leads and nonspecific T wave changes in lateral leads.  Currently without chest pain.  TTE and heart catheterization results as above. -Cardiology managing. -Manage CHF as  above  Moderate bilateral pleural effusion: Likely due to CHF. -Continue IV Lasix -Repeat CXR after adequate diuresis  Mediastinal lymphadenopathy: Likely reactive from CHF -Manage as above  Uncontrolled DM-2 with hyperglycemia: A1c 12.2%.  Not on medication at home. Recent Labs    07/09/19 0736 07/09/19 1241 07/09/19 1610  GLUCAP 210* 158* 254*  -Increase Lantus from 15 to 20 units twice daily -Continue NovoLog 8 units AC and SSI-high. -Continue atorvastatin 80 mg daily -Appreciate diabetic coordinator help.   Essential hypertension?  Normotensive.  Not on medication at home. -IV Lasix as above  Tobacco use disorder -Encouraged cessation. -Nicotine patch.  Hypokalemia: Resolved. -Recheck of replenished.  Hyperlipidemia: LDL 211. -Continue atorvastatin 80 mg daily  Chronic right inguinal hernia/rectus diastases-no incarceration. -Continue monitoring  Nutrition Problem: Increased nutrient needs Etiology: acute illness(acute unknown type CHF)  Signs/Symptoms: estimated needs  Interventions: Premier Protein, Education       DVT prophylaxis: Subcu Lovenox Code Status: Full code Family Communication: Patient and/or Therapist, sports.  Updated patient's cousin over the phone on 4/18.  Discharge barrier: Evaluation and management of acute CHF requiring IV diuretics. Patient is from: Home. Final disposition: Likely home once CHF evaluation completed, adequately diuresed and cleared by cardiology  Consultants:  Cardiology   Sch Meds:  Scheduled Meds: . [START ON 07/10/2019] aspirin EC  81 mg Oral Daily  . atorvastatin  80 mg Oral Daily  . carvedilol  3.125 mg Oral BID WC  . clopidogrel  75 mg Oral Daily  . [START ON 07/10/2019] enoxaparin (LOVENOX) injection  40 mg Subcutaneous Q24H  . insulin aspart  0-20 Units Subcutaneous TID WC  . insulin aspart  0-5 Units Subcutaneous QHS  . insulin aspart  8 Units Subcutaneous TID WC  . insulin glargine  20 Units Subcutaneous BID  .  nicotine  21 mg Transdermal Daily  . pneumococcal 23 valent vaccine  0.5 mL Intramuscular Tomorrow-1000  . Ensure Max Protein  11 oz Oral TID  . sodium chloride flush  3 mL Intravenous Q12H  . spironolactone  25 mg Oral Daily   Continuous Infusions: . sodium chloride 100 mL/hr at 07/09/19 1240  . sodium chloride     PRN Meds:.sodium chloride, acetaminophen, acetaminophen, diazepam, hydrALAZINE, labetalol, nitroGLYCERIN, ondansetron (ZOFRAN) IV, ondansetron (ZOFRAN) IV, sodium chloride flush  Antimicrobials: Anti-infectives (From admission, onward)   None       I have personally reviewed the following labs and images: CBC: Recent Labs  Lab 07/04/19 1750 07/09/19 0435 07/09/19 1144 07/09/19 1201  WBC 9.1 9.2  --   --   HGB 15.9 15.9 17.0  17.0 16.3  HCT 50.9 50.5 50.0  50.0 48.0  MCV 77.7* 76.6*  --   --   PLT 312 298  --   --    BMP &GFR Recent Labs  Lab 07/04/19 1750 07/04/19 1750 07/06/19 0538 07/06/19 0538 07/07/19 0336 07/08/19 0350 07/09/19 0435 07/09/19 1144 07/09/19 1201  NA 136   < > 136   < > 135 136 137 141  141 138  K 3.6   < > 3.1*   < >  3.4* 3.4* 4.3 3.9  4.1 3.7  CL 100  --  97*  --  99 94* 101  --   --   CO2 25  --  27  --  25 29 25   --   --   GLUCOSE 381*  --  332*  --  249* 239* 180*  --   --   BUN 8  --  12  --  14 18 18   --   --   CREATININE 0.79  --  0.85  --  0.82 0.89 0.81  --   --   CALCIUM 9.4  --  8.6*  --  9.1 9.7 9.4  --   --   MG  --   --  1.8  --  2.0 2.0 2.0  --   --   PHOS  --   --   --   --  4.7* 4.8* 4.5  --   --    < > = values in this interval not displayed.   Estimated Creatinine Clearance: 104.2 mL/min (by C-G formula based on SCr of 0.81 mg/dL). Liver & Pancreas: Recent Labs  Lab 07/07/19 0336 07/08/19 0350 07/09/19 0435  ALBUMIN 3.3* 3.4* 3.4*   No results for input(s): LIPASE, AMYLASE in the last 168 hours. No results for input(s): AMMONIA in the last 168 hours. Diabetic: No results for input(s): HGBA1C in  the last 72 hours. Recent Labs  Lab 07/08/19 2133 07/09/19 0614 07/09/19 0736 07/09/19 1241 07/09/19 1610  GLUCAP 263* 220* 210* 158* 254*   Cardiac Enzymes: No results for input(s): CKTOTAL, CKMB, CKMBINDEX, TROPONINI in the last 168 hours. No results for input(s): PROBNP in the last 8760 hours. Coagulation Profile: No results for input(s): INR, PROTIME in the last 168 hours. Thyroid Function Tests: No results for input(s): TSH, T4TOTAL, FREET4, T3FREE, THYROIDAB in the last 72 hours. Lipid Profile: Recent Labs    07/07/19 0336  CHOL 274*  HDL 34*  LDLCALC 211*  TRIG 145  CHOLHDL 8.1   Anemia Panel: No results for input(s): VITAMINB12, FOLATE, FERRITIN, TIBC, IRON, RETICCTPCT in the last 72 hours. Urine analysis:    Component Value Date/Time   COLORURINE COLORLESS (A) 07/05/2019 1749   APPEARANCEUR CLEAR 07/05/2019 1749   LABSPEC 1.002 (L) 07/05/2019 1749   PHURINE 7.0 07/05/2019 1749   GLUCOSEU >=500 (A) 07/05/2019 1749   HGBUR NEGATIVE 07/05/2019 1749   BILIRUBINUR NEGATIVE 07/05/2019 1749   KETONESUR NEGATIVE 07/05/2019 1749   PROTEINUR NEGATIVE 07/05/2019 1749   NITRITE NEGATIVE 07/05/2019 1749   LEUKOCYTESUR NEGATIVE 07/05/2019 1749   Sepsis Labs: Invalid input(s): PROCALCITONIN, LACTICIDVEN  Microbiology: Recent Results (from the past 240 hour(s))  Respiratory Panel by RT PCR (Flu A&B, Covid) - Nasopharyngeal Swab     Status: None   Collection Time: 07/04/19  8:36 PM   Specimen: Nasopharyngeal Swab  Result Value Ref Range Status   SARS Coronavirus 2 by RT PCR NEGATIVE NEGATIVE Final    Comment: (NOTE) SARS-CoV-2 target nucleic acids are NOT DETECTED. The SARS-CoV-2 RNA is generally detectable in upper respiratoy specimens during the acute phase of infection. The lowest concentration of SARS-CoV-2 viral copies this assay can detect is 131 copies/mL. A negative result does not preclude SARS-Cov-2 infection and should not be used as the sole basis for  treatment or other patient management decisions. A negative result may occur with  improper specimen collection/handling, submission of specimen other than nasopharyngeal swab, presence of viral mutation(s) within the areas targeted  by this assay, and inadequate number of viral copies (<131 copies/mL). A negative result must be combined with clinical observations, patient history, and epidemiological information. The expected result is Negative. Fact Sheet for Patients:  https://www.moore.com/https://www.fda.gov/media/142436/download Fact Sheet for Healthcare Providers:  https://www.young.biz/https://www.fda.gov/media/142435/download This test is not yet ap proved or cleared by the [place] FDA and  has been authorized for detection and/or diagnosis of SARS-CoV-2 by FDA under an Emergency Use Authorization (EUA). This EUA will remain  in effect (meaning this test can be used) for the duration of the COVID-19 declaration under Section 564(b)(1) of the Act, 21 U.S.C. section 360bbb-3(b)(1), unless the authorization is terminated or revoked sooner.    Influenza A by PCR NEGATIVE NEGATIVE Final   Influenza B by PCR NEGATIVE NEGATIVE Final    Comment: (NOTE) The Xpert Xpress SARS-CoV-2/FLU/RSV assay is intended as an aid in  the diagnosis of influenza from Nasopharyngeal swab specimens and  should not be used as a sole basis for treatment. Nasal washings and  aspirates are unacceptable for Xpert Xpress SARS-CoV-2/FLU/RSV  testing. Fact Sheet for Patients: https://www.moore.com/https://www.fda.gov/media/142436/download Fact Sheet for Healthcare Providers: https://www.young.biz/https://www.fda.gov/media/142435/download This test is not yet approved or cleared by the [place] FDA and  has been authorized for detection and/or diagnosis of SARS-CoV-2 by  FDA under an Emergency Use Authorization (EUA). This EUA will remain  in effect (meaning this test can be used) for the duration of the  Covid-19 declaration under Section 564(b)(1) of the Act, 21  U.S.C. section  360bbb-3(b)(1), unless the authorization is  terminated or revoked. Performed at [facility] [place], [address] [place], [place] [address]     Radiology Studies: CARDIAC CATHETERIZATION  Result Date: 07/09/2019  Mid RCA to Dist RCA lesion is 100% stenosed.  Acute Mrg lesion is 90% stenosed.  Ost RCA to Prox RCA lesion is 70% stenosed.  Prox RCA to Mid RCA lesion is 90% stenosed.  1st Mrg lesion is 85% stenosed.  2nd Mrg-1 lesion is 100% stenosed.  2nd Mrg-2 lesion is 100% stenosed.  3rd Mrg lesion is 90% stenosed.  Dist Cx lesion is 70% stenosed.  Dist LAD lesion is 100% stenosed.  2nd Diag lesion is 95% stenosed.  Mid LAD lesion is 50% stenosed.  1st Diag-2 lesion is 40% stenosed.  1st Diag-1 lesion is 80% stenosed.  Mid Cx to Dist Cx lesion is 30% stenosed.  Severe diffuse multivessel CAD with moderate irregularity of the LAD with 80% proximal diagonal stenosis prior to a previously placed stent with intimal hyperplasia within the stented segment, 50% the stenosis, 95% stenosis in the second diagonal vessel and total occlusion of the apical LAD; left circumflex vessel with large OM1 vessel with 85% stenosis proximal to an aneurysmal segment, total occlusion of the OM 2 vessel with previously placed stent in this vessel and 90 and 70% bifurcation stenoses in the distal circumflex; severely diffusely diseased RCA with distal occlusion.  There is faint filling of a probable small PLA vessel via the left injection. Normal right heart pressures. LVEDP 16 mmHg. RECOMMENDATION: Plan initiate medical therapy since at present he is not on any anti-ischemic medications.  Most vessels are not amenable to intervention; however if patient experiences increasing chest pain symptomatology the large OM1 vessel can potentially undergo intervention with stenting.  Aggressive lipid-lowering therapy with target LDL less than 70.  Optimal blood pressure control.  Smoking cessation is essential.  With  diffuse CAD consider DAPT therapy.     Sanchez Hemmer T.  Katelen Luepke [facility]  If 7PM-7AM, please contact night-coverage [email or web address] Password [facility] 07/09/2019, 4:59 PM

## 2019-07-09 NOTE — Plan of Care (Signed)
  Problem: Activity: Goal: Ability to return to baseline activity level will improve Outcome: Progressing   Problem: Cardiovascular: Goal: Ability to achieve and maintain adequate cardiovascular perfusion will improve Outcome: Progressing Goal: Vascular access site(s) Level 0-1 will be maintained Outcome: Progressing   

## 2019-07-09 NOTE — Progress Notes (Signed)
Inpatient Diabetes Program Recommendations  AACE/ADA: New Consensus Statement on Inpatient Glycemic Control (2015)  Target Ranges:  Prepandial:   less than 140 mg/dL      Peak postprandial:   less than 180 mg/dL (1-2 hours)      Critically ill patients:  140 - 180 mg/dL   Lab Results  Component Value Date   GLUCAP 158 (H) 07/09/2019   HGBA1C 12.2 (H) 07/05/2019    Review of Glycemic Control  Inpatient Diabetes Program Recommendations:   Met with patient @ bedside and discussed insulin injections and why patient hesitant to take insulin. Patient requests more guidance when administering himself insulin and reviewed purpose of insulin and injection sites. Will followup tomorrow.  Thank you, Nani Gasser. Jaeleen Inzunza, RN, MSN, CDE  Diabetes Coordinator Inpatient Glycemic Control Team Team Pager [phone] (8am-5pm) 07/09/2019 2:37 PM

## 2019-07-09 NOTE — Interval H&P Note (Signed)
Cath Lab Visit (complete for each Cath Lab visit)  Clinical Evaluation Leading to the Procedure:   ACS: No.  Non-ACS:    Anginal Classification: CCS II  Anti-ischemic medical therapy: No Therapy  Non-Invasive Test Results: No non-invasive testing performed  Prior CABG: No previous CABG      History and Physical Interval Note:  07/09/2019 11:18 AM  Larry Lewis  has presented today for surgery, with the diagnosis of heart failure.  The various methods of treatment have been discussed with the patient and family. After consideration of risks, benefits and other options for treatment, the patient has consented to  Procedure(s): RIGHT/LEFT HEART CATH AND CORONARY ANGIOGRAPHY (N/A) as a surgical intervention.  The patient's history has been reviewed, patient examined, no change in status, stable for surgery.  I have reviewed the patient's chart and labs.  Questions were answered to the patient's satisfaction.     Nicki Guadalajara

## 2019-07-10 DIAGNOSIS — I2510 Atherosclerotic heart disease of native coronary artery without angina pectoris: Secondary | ICD-10-CM

## 2019-07-10 LAB — GLUCOSE, CAPILLARY
Glucose-Capillary: 250 mg/dL — ABNORMAL HIGH (ref 70–99)
Glucose-Capillary: 231 mg/dL — ABNORMAL HIGH (ref 70–99)
Glucose-Capillary: 144 mg/dL — ABNORMAL HIGH (ref 70–99)
Glucose-Capillary: 133 mg/dL — ABNORMAL HIGH (ref 70–99)
Glucose-Capillary: 235 mg/dL — ABNORMAL HIGH (ref 70–99)

## 2019-07-10 LAB — RENAL FUNCTION PANEL
Albumin: 3.2 g/dL — ABNORMAL LOW (ref 3.5–5.0)
Anion gap: 13 (ref 5–15)
BUN: 19 mg/dL (ref 6–20)
CO2: 22 mmol/L (ref 22–32)
Calcium: 9.2 mg/dL (ref 8.9–10.3)
Chloride: 100 mmol/L (ref 98–111)
Creatinine, Ser: 0.82 mg/dL (ref 0.61–1.24)
GFR calc Af Amer: >60 mL/min (ref >60)
GFR calc non Af Amer: >60 mL/min (ref >60)
Glucose, Bld: 266 mg/dL — ABNORMAL HIGH (ref 70–99)
Phosphorus: 4.3 mg/dL (ref 2.5–4.6)
Potassium: 4.3 mmol/L (ref 3.5–5.1)
Sodium: 135 mmol/L (ref 135–145)

## 2019-07-10 LAB — CBC
HCT: 47 % (ref 39.0–52.0)
Hemoglobin: 14.8 g/dL (ref 13.0–17.0)
MCH: 24 pg — ABNORMAL LOW (ref 26.0–34.0)
MCHC: 31.5 g/dL (ref 30.0–36.0)
MCV: 76.3 fL — ABNORMAL LOW (ref 80.0–100.0)
Platelets: 295 10*3/uL (ref 150–400)
RBC: 6.16 MIL/uL — ABNORMAL HIGH (ref 4.22–5.81)
RDW: 13.3 % (ref 11.5–15.5)
WBC: 9.3 10*3/uL (ref 4.0–10.5)
nRBC: 0 % (ref 0.0–0.2)

## 2019-07-10 LAB — MAGNESIUM: Magnesium: 1.9 mg/dL (ref 1.7–2.4)

## 2019-07-10 MED ORDER — METOPROLOL SUCCINATE ER 25 MG PO TB24
25.0000 mg | ORAL_TABLET | Freq: Every day | ORAL | Status: DC
  Administered 2019-07-11: 25 mg via ORAL
  Filled 2019-07-10: qty 1

## 2019-07-10 MED ORDER — SPIRONOLACTONE 12.5 MG HALF TABLET
12.5000 mg | ORAL_TABLET | Freq: Every day | ORAL | Status: DC
  Administered 2019-07-11: 12.5 mg via ORAL
  Filled 2019-07-10: qty 1

## 2019-07-10 MED ORDER — METOPROLOL SUCCINATE ER 25 MG PO TB24: 25.0000 mg | ORAL_TABLET | Freq: Every day | ORAL | Status: DC

## 2019-07-10 MED ORDER — SACUBITRIL-VALSARTAN 24-26 MG PO TABS
1.0000 | ORAL_TABLET | Freq: Two times a day (BID) | ORAL | Status: DC
  Administered 2019-07-10 – 2019-07-11 (×3): 1 via ORAL
  Filled 2019-07-10 (×3): qty 1

## 2019-07-10 MED ORDER — FUROSEMIDE 40 MG PO TABS: 40.0000 mg | ORAL_TABLET | Freq: Every day | ORAL | Status: DC

## 2019-07-10 MED ORDER — CARVEDILOL 3.125 MG PO TABS
3.1250 mg | ORAL_TABLET | Freq: Two times a day (BID) | ORAL | Status: AC
  Administered 2019-07-10: 3.125 mg via ORAL
  Filled 2019-07-10: qty 1

## 2019-07-10 MED ORDER — FUROSEMIDE 20 MG PO TABS
20.0000 mg | ORAL_TABLET | Freq: Every day | ORAL | Status: DC
  Administered 2019-07-10 – 2019-07-11 (×2): 20 mg via ORAL
  Filled 2019-07-10 (×2): qty 1

## 2019-07-10 NOTE — TOC Initial Note (Signed)
Transition of Care [facility]) - Initial/Assessment Note    Patient Details  Name: Larry Lewis MRN: [id number] Date of Birth: 12-Nov-1965  Transition of Care [facility]) CM/SW Contact:    Gala Lewandowsky, RN Phone Number: 07/10/2019, 3:03 PM  Clinical Narrative: Patient presented for shortness of breath- acute respiratory failure. Plan for transition home soon. Patient is without insurance and primary care provider at this time. Hospital follow up appointment scheduled at the [facility] on[facility] at this location and medications will range from $4.00-$10.00. MATCH is completed and medications will be delivered via [facility]. Case Manager spoke with physician this am regarding medications. Case Manager will continue to follow for additional transition of care needs.                 Expected Discharge Plan: Home/Self Care Barriers to Discharge: No Barriers Identified  Patient Goals and CMS Choice Patient states their goals for this hospitalization and ongoing recovery are:: to return home.   Choice offered to / list presented to : NA  Expected Discharge Plan and Services Expected Discharge Plan: Home/Self Care In-house Referral: Financial Counselor(Financial counselor to follow up with patient post hospitalization 2/2 Medicaid/Disability.) Discharge Planning Services: CM Consult, Indigent Health Clinic, Follow-up appt scheduled Post Acute Care Choice: NA Living arrangements for the past 2 months: Single Family Home                   DME Agency: NA       HH Arranged: NA    Prior Living Arrangements/Services Living arrangements for the past 2 months: Single Family Home Lives with:: Friends Patient language and need for interpreter reviewed:: Yes Do you feel safe going back to the place where you live?: Yes      Need for Family Participation in Patient Care: No (Comment) Care giver support system in place?: No (comment)   Criminal Activity/Legal  Involvement Pertinent to Current Situation/Hospitalization: No - Comment as needed  Activities of Daily Living Home Assistive Devices/Equipment: None ADL Screening (condition at time of admission) Patient's cognitive ability adequate to safely complete daily activities?: No Is the patient deaf or have difficulty hearing?: No Does the patient have difficulty seeing, even when wearing glasses/contacts?: No Does the patient have difficulty concentrating, remembering, or making decisions?: No Patient able to express need for assistance with ADLs?: Yes Does the patient have difficulty dressing or bathing?: No Independently performs ADLs?: Yes (appropriate for developmental age) Does the patient have difficulty walking or climbing stairs?: No Weakness of Legs: None Weakness of Arms/Hands: None     Emotional Assessment Appearance:: Appears stated age Attitude/Demeanor/Rapport: Engaged Affect (typically observed): Appropriate Orientation: : Oriented to Situation, Oriented to  Time, Oriented to Place, Oriented to Self Alcohol / Substance Use: Not Applicable Psych Involvement: No (comment)  Admission diagnosis:  Hypoxia [R09.02] Pleural effusion, bilateral [J90] Acute respiratory failure with hypoxia (HCC) [J96.01] Acute on chronic congestive heart failure, unspecified heart failure type (HCC) [I50.9] Acute exacerbation of CHF (congestive heart failure) (HCC) [I50.9] Patient Active Problem List   Diagnosis Date Noted  . Coronary artery disease involving native coronary artery of native heart with angina pectoris (HCC)   . Acute exacerbation of CHF (congestive heart failure) (HCC) 07/05/2019  . Uncontrolled type 2 diabetes mellitus with hyperglycemia (HCC) 07/05/2019  . Tobacco abuse 07/05/2019  . Bilateral pleural effusion 07/05/2019  . Acute respiratory failure with hypoxia (HCC) 07/04/2019   PCP:  Patient, No Pcp Per Pharmacy:   [id number] - [place], [place] - [address] [place] [address] Phone: [phone] Fax: [phone]  [facility] [place], [place] - [address] [place] [address] Phone: [phone] Fax: [phone]   Readmission Risk Interventions No flowsheet data found.

## 2019-07-10 NOTE — Progress Notes (Signed)
PROGRESS NOTE    Larry Lewis  GYI:[id number] DOB: 12-Dec-1965 DOA: 07/04/2019 PCP: Patient, No Pcp Per   Chief Complaint  Patient presents with  . Respiratory Distress    Brief Narrative:  55 year old male with history of HTN, HLD, CAD, asthma, tobacco use disorder, right inguinal hernia and anxiety presenting with progressive shortness of breath, orthopnea, UTI symptoms and insomnia, and admitted with acute unknown type CHF.  In ED, 100-1 110s.  RR 20s to 30s.  Desaturated to 88% with ambulation on RA requiring 4 L to recover.  Glucose 332.  MCV 77.7.  Otherwise, CBC and BMP without significant finding.  BNP 267. HS Trop 25x2.  EKG with sinus tachycardia, J-point elevation in anterior leads and T wave changes in lateral leads. CXR with cardiomegaly and pulmonary vascular congestion.  COVID-19 PCR and influenza PCR negative.  CTA chest negative for PE but moderate bilateral pleural effusions, dependent atelectasis, cardiomegaly with prominent LA and LV dilation, mediastinal adenopathy and cholelithiasis.  Started on IV Lasix and 10 units of insulin and admitted for CHF exacerbation.  Continued on IV Lasix 40 mg twice daily.    TTE with EF of 35 to 40%, RWMA, indeterminate diastolic functions, moderate LAE with moderate aortic valve sclerosis/calcification and indeterminate cuspid structures.  Cardiology consulted on 4/18.  R/LHC on 4/20 with diffuse CAD and normal right heart pressure.  See details below.  Assessment & Plan:   Principal Problem:   Acute respiratory failure with hypoxia (HCC) Active Problems:   Acute exacerbation of CHF (congestive heart failure) ([facility])   Uncontrolled type 2 diabetes mellitus with hyperglycemia (HCC)   Tobacco abuse   Bilateral pleural effusion   Coronary artery disease involving native coronary artery of native heart with angina pectoris (HCC)  Acute respiratory failure with hypoxia due to unknown type and acute combined CHF: TTE with EF of 35 to 40%,  RWMA, indeterminate diastolic functions, moderate LAE with moderate aortic valve sclerosis/calcification and indeterminate cuspid structures.  No previous echo in chart. CXR, CTA and BNP consistent with acute CHF.  R/LHC as above.  Responding to IV Lasix clinically.  UOP not captured well.  Weight does not seem accurate.  Renal function relatively stable. -Cardiology managing, appreciate recs -transitioned to oral lasix - spironolactone, entresto, coreg  Elevated troponin/history of CAD s/p stents in 2007: Likely demand ischemia due to acute CHF.  EKG with sinus tachycardia, J-point elevation in anterior leads and nonspecific T wave changes in lateral leads.  Currently without chest pain.  TTE with EF 35-40%, RWMA, LV diastolic parameters indeterminate.  R/LHC with severe diffuse diffuse multivessel CAD (see report).  Recommended medical therapy.   -Cardiology managing - continue asa, plavix, coreg -> plan to transition to toprol xl tomorrow per cards -Manage CHF as above  Moderate bilateral pleural effusion: Likely due to CHF. -Continue IV Lasix -Repeat CXR after adequate diuresis  Mediastinal lymphadenopathy: Likely reactive from CHF -Manage as above  Uncontrolled DM-2 with hyperglycemia: A1c 12.2%.  Not on medication at home. -lantus 25 units BID -Continue NovoLog 8 units AC and SSI-high. -Continue atorvastatin 80 mg daily -Appreciate diabetic coordinator help, needs education treating himself  Essential hypertension?  Normotensive.  Not on medication at home. -IV Lasix as above, BP meds for HF  Tobacco use disorder -Encouraged cessation. -Nicotine patch.  Hypokalemia: Resolved. -Recheck of replenished.  Hyperlipidemia: LDL 211. -Continue atorvastatin 80 mg daily  Chronic right inguinal hernia/rectus diastases-no incarceration. -Continue monitoring  Nutrition Problem: Increased nutrient needs Etiology: acute  illness(acute unknown type CHF)  Signs/Symptoms:  estimated needs  Interventions: Premier Protein, Education  DVT prophylaxis: lovenox Code Status: full  Family Communication: none at bedside Disposition:   Status is: Inpatient  Remains inpatient appropriate because:Inpatient level of care appropriate due to severity of illness  Dispo: The patient is from: Home              Anticipated d/c is to: Home              Anticipated d/c date is: 1 day              Patient currently is not medically stable to d/c.   Consultants:   cardiology  Procedures:  Card Cath  Mid RCA to Dist RCA lesion is 100% stenosed.  Acute Mrg lesion is 90% stenosed.  Ost RCA to Prox RCA lesion is 70% stenosed.  Prox RCA to Mid RCA lesion is 90% stenosed.  1st Mrg lesion is 85% stenosed.  2nd Mrg-1 lesion is 100% stenosed.  2nd Mrg-2 lesion is 100% stenosed.  3rd Mrg lesion is 90% stenosed.  Dist Cx lesion is 70% stenosed.  Dist LAD lesion is 100% stenosed.  2nd Diag lesion is 95% stenosed.  Mid LAD lesion is 50% stenosed.  1st Diag-2 lesion is 40% stenosed.  1st Diag-1 lesion is 80% stenosed.  Mid Cx to Dist Cx lesion is 30% stenosed.   Severe diffuse multivessel CAD with moderate irregularity of the LAD with 80% proximal diagonal stenosis prior to Larry Lewis previously placed stent with intimal hyperplasia within the stented segment, 50% the stenosis, 95% stenosis in the second diagonal vessel and total occlusion of the apical LAD; left circumflex vessel with large OM1 vessel with 85% stenosis proximal to an aneurysmal segment, total occlusion of the OM 2 vessel with previously placed stent in this vessel and 90 and 70% bifurcation stenoses in the distal circumflex; severely diffusely diseased RCA with distal occlusion.  There is faint filling of Larry Lewis probable small PLA vessel via the left injection.  Normal right heart pressures.  LVEDP 16 mmHg.  RECOMMENDATION: Plan initiate medical therapy since at present he is not on any anti-ischemic  medications.  Most vessels are not amenable to intervention; however if patient experiences increasing chest pain symptomatology the large OM1 vessel can potentially undergo intervention with stenting.  Aggressive lipid-lowering therapy with target LDL less than 70.  Optimal blood pressure control.  Smoking cessation is essential.  With diffuse CAD consider DAPT therapy.  Echo IMPRESSIONS    1. Left ventricular ejection fraction, by estimation, is 35 to 40%. The  left ventricle has moderately decreased function. The left ventricle  demonstrates regional wall motion abnormalities (see scoring  diagram/findings for description). Left ventricular  diastolic parameters are indeterminate.  2. Right ventricular systolic function is normal. The right ventricular  size is normal.  3. Left atrial size was moderately dilated.  4. The mitral valve is normal in structure. Mild mitral valve  regurgitation. No evidence of mitral stenosis.  5. Indeterminate cusp structure. Given age and amount of calcification,  high suspicion for bicuspid valve, either functional or congenital.. The  aortic valve has an indeterminant number of cusps. Aortic valve  regurgitation is not visualized. Mild to  moderate aortic valve sclerosis/calcification is present, without any  evidence of aortic stenosis.  6. The inferior vena cava is normal in size with greater than 50%  respiratory variability, suggesting right atrial pressure of 3 mmHg.   Antimicrobials:  Anti-infectives (From  admission, onward)   None      Subjective: No new complaints Declined interpreter  Objective: Vitals:   07/10/19 1225 07/10/19 1359 07/10/19 1555 07/10/19 1632  BP: (!) 89/60 (!) 116/92 103/78 115/89  Pulse: 97 (!) 103 (!) 101 (!) 102  Resp: 19     Temp: 98.3 F (36.8 C) 98.1 F (36.7 C) 98.4 F (36.9 C)   TempSrc: Oral Oral Oral   SpO2: 96% 97% 96%   Weight:      Height:        Intake/Output Summary (Last 24 hours)  at 07/10/2019 1940 Last data filed at 07/10/2019 0010 Gross per 24 hour  Intake --  Output 400 ml  Net -400 ml   Filed Weights   07/08/19 0354 07/09/19 0537 07/10/19 0500  Weight: 79.4 kg 79.9 kg 81.8 kg    Examination:  General exam: Appears calm and comfortable  Respiratory system: Clear to auscultation. Respiratory effort normal. Cardiovascular system: S1 & S2 heard, RRR. Gastrointestinal system: Abdomen is nondistended, soft and nontender.  Central nervous system: Alert and oriented. No focal neurological deficits. Extremities: no LEE Skin: No rashes, lesions or ulcers Psychiatry: Judgement and insight appear normal. Mood & affect appropriate.     Data Reviewed: I have personally reviewed following labs and imaging studies  CBC: Recent Labs  Lab 07/04/19 1750 07/09/19 0435 07/09/19 1144 07/09/19 1201 07/10/19 0421  WBC 9.1 9.2  --   --  9.3  HGB 15.9 15.9 17.0  17.0 16.3 14.8  HCT 50.9 50.5 50.0  50.0 48.0 47.0  MCV 77.7* 76.6*  --   --  76.3*  PLT 312 298  --   --  295    Basic Metabolic Panel: Recent Labs  Lab 07/06/19 0538 07/06/19 0538 07/07/19 0336 07/07/19 0336 07/08/19 0350 07/09/19 0435 07/09/19 1144 07/09/19 1201 07/10/19 0421  NA 136   < > 135   < > 136 137 141  141 138 135  K 3.1*   < > 3.4*   < > 3.4* 4.3 3.9  4.1 3.7 4.3  CL 97*  --  99  --  94* 101  --   --  100  CO2 27  --  25  --  29 25  --   --  22  GLUCOSE 332*  --  249*  --  239* 180*  --   --  266*  BUN 12  --  14  --  18 18  --   --  19  CREATININE 0.85  --  0.82  --  0.89 0.81  --   --  0.82  CALCIUM 8.6*  --  9.1  --  9.7 9.4  --   --  9.2  MG 1.8  --  2.0  --  2.0 2.0  --   --  1.9  PHOS  --   --  4.7*  --  4.8* 4.5  --   --  4.3   < > = values in this interval not displayed.    GFR: Estimated Creatinine Clearance: 104.1 mL/min (by C-G formula based on SCr of 0.82 mg/dL).  Liver Function Tests: Recent Labs  Lab 07/07/19 0336 07/08/19 0350 07/09/19 0435  07/10/19 0421  ALBUMIN 3.3* 3.4* 3.4* 3.2*    CBG: Recent Labs  Lab 07/09/19 2112 07/10/19 0805 07/10/19 1058 07/10/19 1306 07/10/19 1613  GLUCAP 257* 250* 231* 144* 133*     Recent Results (from the past 240 hour(s))  Respiratory Panel by RT PCR (Flu Triniti Gruetzmacher&B, Covid) - Nasopharyngeal Swab     Status: None   Collection Time: 07/04/19  8:36 PM   Specimen: Nasopharyngeal Swab  Result Value Ref Range Status   SARS Coronavirus 2 by RT PCR NEGATIVE NEGATIVE Final    Comment: (NOTE) SARS-CoV-2 target nucleic acids are NOT DETECTED. The SARS-CoV-2 RNA is generally detectable in upper respiratoy specimens during the acute phase of infection. The lowest concentration of SARS-CoV-2 viral copies this assay can detect is 131 copies/mL. Reilly Blades negative result does not preclude SARS-Cov-2 infection and should not be used as the sole basis for treatment or other patient management decisions. Chloeann Alfred negative result may occur with  improper specimen collection/handling, submission of specimen other than nasopharyngeal swab, presence of viral mutation(s) within the areas targeted by this assay, and inadequate number of viral copies (<131 copies/mL). Daleysa Kristiansen negative result must be combined with clinical observations, patient history, and epidemiological information. The expected result is Negative. Fact Sheet for Patients:  https://www.moore.com/ Fact Sheet for Healthcare Providers:  https://www.young.biz/ This test is not yet ap proved or cleared by the [place] FDA and  has been authorized for detection and/or diagnosis of SARS-CoV-2 by FDA under an Emergency Use Authorization (EUA). This EUA will remain  in effect (meaning this test can be used) for the duration of the COVID-19 declaration under Section 564(b)(1) of the Act, 21 U.S.C. section 360bbb-3(b)(1), unless the authorization is terminated or revoked sooner.    Influenza Daneshia Tavano by PCR NEGATIVE NEGATIVE Final    Influenza B by PCR NEGATIVE NEGATIVE Final    Comment: (NOTE) The Xpert Xpress SARS-CoV-2/FLU/RSV assay is intended as an aid in  the diagnosis of influenza from Nasopharyngeal swab specimens and  should not be used as Analysia Dungee sole basis for treatment. Nasal washings and  aspirates are unacceptable for Xpert Xpress SARS-CoV-2/FLU/RSV  testing. Fact Sheet for Patients: https://www.moore.com/ Fact Sheet for Healthcare Providers: https://www.young.biz/ This test is not yet approved or cleared by the [place] FDA and  has been authorized for detection and/or diagnosis of SARS-CoV-2 by  FDA under an Emergency Use Authorization (EUA). This EUA will remain  in effect (meaning this test can be used) for the duration of the  Covid-19 declaration under Section 564(b)(1) of the Act, 21  U.S.C. section 360bbb-3(b)(1), unless the authorization is  terminated or revoked. Performed at [facility], [address], [place], [place] [address]          Radiology Studies: CARDIAC CATHETERIZATION  Result Date: 07/09/2019  Mid RCA to Dist RCA lesion is 100% stenosed.  Acute Mrg lesion is 90% stenosed.  Ost RCA to Prox RCA lesion is 70% stenosed.  Prox RCA to Mid RCA lesion is 90% stenosed.  1st Mrg lesion is 85% stenosed.  2nd Mrg-1 lesion is 100% stenosed.  2nd Mrg-2 lesion is 100% stenosed.  3rd Mrg lesion is 90% stenosed.  Dist Cx lesion is 70% stenosed.  Dist LAD lesion is 100% stenosed.  2nd Diag lesion is 95% stenosed.  Mid LAD lesion is 50% stenosed.  1st Diag-2 lesion is 40% stenosed.  1st Diag-1 lesion is 80% stenosed.  Mid Cx to Dist Cx lesion is 30% stenosed.  Severe diffuse multivessel CAD with moderate irregularity of the LAD with 80% proximal diagonal stenosis prior to Raymie Giammarco previously placed stent with intimal hyperplasia within the stented segment, 50% the stenosis, 95% stenosis in the second diagonal vessel and total occlusion of  the apical  LAD; left circumflex vessel with large OM1 vessel with 85% stenosis proximal to an aneurysmal segment, total occlusion of the OM 2 vessel with previously placed stent in this vessel and 90 and 70% bifurcation stenoses in the distal circumflex; severely diffusely diseased RCA with distal occlusion.  There is faint filling of Katiejo Gilroy probable small PLA vessel via the left injection. Normal right heart pressures. LVEDP 16 mmHg. RECOMMENDATION: Plan initiate medical therapy since at present he is not on any anti-ischemic medications.  Most vessels are not amenable to intervention; however if patient experiences increasing chest pain symptomatology the large OM1 vessel can potentially undergo intervention with stenting.  Aggressive lipid-lowering therapy with target LDL less than 70.  Optimal blood pressure control.  Smoking cessation is essential.  With diffuse CAD consider DAPT therapy.        Scheduled Meds: . aspirin EC  81 mg Oral Daily  . atorvastatin  80 mg Oral Daily  . clopidogrel  75 mg Oral Daily  . enoxaparin (LOVENOX) injection  40 mg Subcutaneous Q24H  . furosemide  20 mg Oral Daily  . insulin aspart  0-20 Units Subcutaneous TID WC  . insulin aspart  0-5 Units Subcutaneous QHS  . insulin aspart  8 Units Subcutaneous TID WC  . insulin glargine  25 Units Subcutaneous BID  . [START ON 07/11/2019] metoprolol succinate  25 mg Oral Daily  . nicotine  21 mg Transdermal Daily  . pneumococcal 23 valent vaccine  0.5 mL Intramuscular Tomorrow-1000  . Ensure Max Protein  11 oz Oral TID  . sacubitril-valsartan  1 tablet Oral BID  . sodium chloride flush  3 mL Intravenous Q12H  . [START ON 07/11/2019] spironolactone  12.5 mg Oral Daily   Continuous Infusions: . sodium chloride       LOS: 5 days    Time spent: over 30 min    Lacretia Nicks, MD [facility]   To contact the attending provider between 7A-7P or the covering provider during after hours 7P-7A, please log into  the web site [email or web address] and access using universal [place] password for that web site. If you do not have the password, please call the hospital operator.  07/10/2019, 7:40 PM

## 2019-07-10 NOTE — Progress Notes (Addendum)
Progress Note  Patient Name: Larry Lewis Date of Encounter: 07/10/2019  Primary Cardiologist: Little Ishikawa, MD   Subjective   Pt resting flat.  Inpatient Medications    Scheduled Meds: . aspirin EC  81 mg Oral Daily  . atorvastatin  80 mg Oral Daily  . carvedilol  3.125 mg Oral BID WC  . clopidogrel  75 mg Oral Daily  . enoxaparin (LOVENOX) injection  40 mg Subcutaneous Q24H  . insulin aspart  0-20 Units Subcutaneous TID WC  . insulin aspart  0-5 Units Subcutaneous QHS  . insulin aspart  8 Units Subcutaneous TID WC  . insulin glargine  25 Units Subcutaneous BID  . nicotine  21 mg Transdermal Daily  . pneumococcal 23 valent vaccine  0.5 mL Intramuscular Tomorrow-1000  . Ensure Max Protein  11 oz Oral TID  . sacubitril-valsartan  1 tablet Oral BID  . sodium chloride flush  3 mL Intravenous Q12H  . spironolactone  25 mg Oral Daily   Continuous Infusions: . sodium chloride     PRN Meds: sodium chloride, acetaminophen, acetaminophen, diazepam, nitroGLYCERIN, ondansetron (ZOFRAN) IV, ondansetron (ZOFRAN) IV, sodium chloride flush   Vital Signs    Vitals:   07/10/19 0414 07/10/19 0500 07/10/19 0607 07/10/19 0803  BP: 129/90  123/89 122/89  Pulse:    (!) 108  Resp:   16   Temp:   98.2 F (36.8 C) 98.1 F (36.7 C)  TempSrc:   Oral Oral  SpO2: 95%  94% 90%  Weight:  81.8 kg    Height:        Intake/Output Summary (Last 24 hours) at 07/10/2019 0818 Last data filed at 07/10/2019 0010 Gross per 24 hour  Intake 740.61 ml  Output 1150 ml  Net -409.39 ml   Last 3 Weights 07/10/2019 07/09/2019 07/08/2019  Weight (lbs) 180 lb 6.4 oz 176 lb 3.2 oz 175 lb  Weight (kg) 81.829 kg 79.924 kg 79.379 kg      Telemetry    Sinus tachycardia in the 100s - Personally Reviewed  ECG    No new tracings - Personally Reviewed  Physical Exam   GEN: No acute distress.   Neck: No JVD Cardiac: RRR, no murmurs, rubs, or gallops.  Respiratory: rhochi and wheezing GI:  Soft, nontender, non-distended  MS: No edema; No deformity. Neuro:  Nonfocal  Psych: Normal affect   Labs    High Sensitivity Troponin:   Recent Labs  Lab 07/04/19 1750 07/04/19 2036  TROPONINIHS 25* 25*      Chemistry Recent Labs  Lab 07/08/19 0350 07/08/19 0350 07/09/19 0435 07/09/19 0435 07/09/19 1144 07/09/19 1201 07/10/19 0421  NA 136   < > 137   < > 141  141 138 135  K 3.4*   < > 4.3   < > 3.9  4.1 3.7 4.3  CL 94*  --  101  --   --   --  100  CO2 29  --  25  --   --   --  22  GLUCOSE 239*  --  180*  --   --   --  266*  BUN 18  --  18  --   --   --  19  CREATININE 0.89  --  0.81  --   --   --  0.82  CALCIUM 9.7  --  9.4  --   --   --  9.2  ALBUMIN 3.4*  --  3.4*  --   --   --  3.2*  GFRNONAA >60  --  >60  --   --   --  >60  GFRAA >60  --  >60  --   --   --  >60  ANIONGAP 13  --  11  --   --   --  13   < > = values in this interval not displayed.     Hematology Recent Labs  Lab 07/04/19 1750 07/04/19 1750 07/09/19 0435 07/09/19 0435 07/09/19 1144 07/09/19 1201 07/10/19 0421  WBC 9.1  --  9.2  --   --   --  9.3  RBC 6.55*  --  6.59*  --   --   --  6.16*  HGB 15.9   < > 15.9   < > 17.0  17.0 16.3 14.8  HCT 50.9   < > 50.5   < > 50.0  50.0 48.0 47.0  MCV 77.7*  --  76.6*  --   --   --  76.3*  MCH 24.3*  --  24.1*  --   --   --  24.0*  MCHC 31.2  --  31.5  --   --   --  31.5  RDW 14.5  --  13.3  --   --   --  13.3  PLT 312  --  298  --   --   --  295   < > = values in this interval not displayed.    BNP Recent Labs  Lab 07/04/19 1750  BNP 267.6*     DDimer No results for input(s): DDIMER in the last 168 hours.   Radiology    CARDIAC CATHETERIZATION  Result Date: 07/09/2019  Mid RCA to Dist RCA lesion is 100% stenosed.  Acute Mrg lesion is 90% stenosed.  Ost RCA to Prox RCA lesion is 70% stenosed.  Prox RCA to Mid RCA lesion is 90% stenosed.  1st Mrg lesion is 85% stenosed.  2nd Mrg-1 lesion is 100% stenosed.  2nd Mrg-2 lesion is  100% stenosed.  3rd Mrg lesion is 90% stenosed.  Dist Cx lesion is 70% stenosed.  Dist LAD lesion is 100% stenosed.  2nd Diag lesion is 95% stenosed.  Mid LAD lesion is 50% stenosed.  1st Diag-2 lesion is 40% stenosed.  1st Diag-1 lesion is 80% stenosed.  Mid Cx to Dist Cx lesion is 30% stenosed.  Severe diffuse multivessel CAD with moderate irregularity of the LAD with 80% proximal diagonal stenosis prior to a previously placed stent with intimal hyperplasia within the stented segment, 50% the stenosis, 95% stenosis in the second diagonal vessel and total occlusion of the apical LAD; left circumflex vessel with large OM1 vessel with 85% stenosis proximal to an aneurysmal segment, total occlusion of the OM 2 vessel with previously placed stent in this vessel and 90 and 70% bifurcation stenoses in the distal circumflex; severely diffusely diseased RCA with distal occlusion.  There is faint filling of a probable small PLA vessel via the left injection. Normal right heart pressures. LVEDP 16 mmHg. RECOMMENDATION: Plan initiate medical therapy since at present he is not on any anti-ischemic medications.  Most vessels are not amenable to intervention; however if patient experiences increasing chest pain symptomatology the large OM1 vessel can potentially undergo intervention with stenting.  Aggressive lipid-lowering therapy with target LDL less than 70.  Optimal blood pressure control.  Smoking cessation is essential.  With diffuse CAD consider DAPT therapy.    Cardiac Studies   Right and left  heart cath 07/09/19:  Mid RCA to Dist RCA lesion is 100% stenosed.  Acute Mrg lesion is 90% stenosed.  Ost RCA to Prox RCA lesion is 70% stenosed.  Prox RCA to Mid RCA lesion is 90% stenosed.  1st Mrg lesion is 85% stenosed.  2nd Mrg-1 lesion is 100% stenosed.  2nd Mrg-2 lesion is 100% stenosed.  3rd Mrg lesion is 90% stenosed.  Dist Cx lesion is 70% stenosed.  Dist LAD lesion is 100%  stenosed.  2nd Diag lesion is 95% stenosed.  Mid LAD lesion is 50% stenosed.  1st Diag-2 lesion is 40% stenosed.  1st Diag-1 lesion is 80% stenosed.  Mid Cx to Dist Cx lesion is 30% stenosed.   Severe diffuse multivessel CAD with moderate irregularity of the LAD with 80% proximal diagonal stenosis prior to a previously placed stent with intimal hyperplasia within the stented segment, 50% the stenosis, 95% stenosis in the second diagonal vessel and total occlusion of the apical LAD; left circumflex vessel with large OM1 vessel with 85% stenosis proximal to an aneurysmal segment, total occlusion of the OM 2 vessel with previously placed stent in this vessel and 90 and 70% bifurcation stenoses in the distal circumflex; severely diffusely diseased RCA with distal occlusion.  There is faint filling of a probable small PLA vessel via the left injection.  Normal right heart pressures.  LVEDP 16 mmHg.  RECOMMENDATION: Plan initiate medical therapy since at present he is not on any anti-ischemic medications.  Most vessels are not amenable to intervention; however if patient experiences increasing chest pain symptomatology the large OM1 vessel can potentially undergo intervention with stenting.  Aggressive lipid-lowering therapy with target LDL less than 70.  Optimal blood pressure control.  Smoking cessation is essential.  With diffuse CAD consider DAPT therapy.   Echo 07/06/19: 1. Left ventricular ejection fraction, by estimation, is 35 to 40%. The  left ventricle has moderately decreased function. The left ventricle  demonstrates regional wall motion abnormalities (see scoring  diagram/findings for description). Left ventricular  diastolic parameters are indeterminate.  2. Right ventricular systolic function is normal. The right ventricular  size is normal.  3. Left atrial size was moderately dilated.  4. The mitral valve is normal in structure. Mild mitral valve  regurgitation. No  evidence of mitral stenosis.  5. Indeterminate cusp structure. Given age and amount of calcification,  high suspicion for bicuspid valve, either functional or congenital.. The  aortic valve has an indeterminant number of cusps. Aortic valve  regurgitation is not visualized. Mild to  moderate aortic valve sclerosis/calcification is present, without any  evidence of aortic stenosis.  6. The inferior vena cava is normal in size with greater than 50%  respiratory variability, suggesting right atrial pressure of 3 mmHg.    Patient Profile     54 y.o. male w/ hx CAD(2 stents placed 2007 in [place], [place]), DM, hypertension, hyperlipidemia, asthma, tobacco use, anxietywas admitted 04/16 w/ SOB, CHF, uncontrolled CRFs.   Assessment & Plan    CAD - heart cath revealed severe diffuse multivessel CAD not amenable to intervention - recommended for medical therapy   Acute combined systolic and diastolic heart failure - echo this admission with EF 35-40% - right heart cath with normal right heart pressures - sCr 0.82 today - continue BB, entresto 24-26 started today, continue spironolactone - weight is up to 180 lbs, from 178 lbs on admission - overall net negative 4L with 1.1 L urine output yesterday - will observe fluid status  after institution of entresto   Hyperlipidemia with LDL goal < 70 07/07/2019: Cholesterol 274; HDL 34; LDL Cholesterol 211; Triglycerides 145; VLDL 29 - started on high dose statin - repeat labs in 6 weeks   Tobacco abuse - counseled on smoking cessation   Will arrange follow up with cardiology.     For questions or updates, please contact [place] Please consult [email or web address] for contact info under      Signed, Ledora Bottcher, PA  07/10/2019, 8:18 AM     Patient seen and examined.  Agree with above documentation.On exam, patient is alert and oriented, tachycardic, no murmurs, lungs CTAB, no LE edema or JVD.  Telemetry personally reviewed  and shows sinus rhythm rate 100s to 110s.  Cardiac catheterization yesterday showed normal filling pressures but severe multivessel coronary artery disease, not amenable to intervention.  For his CAD, would continue aspirin and have added Plavix 75 mg daily.  Has been started on beta-blocker with carvedilol 3.125 mg twice daily, will plan to switch to Toprol-XL tomorrow to have more room on his blood pressure to titrate heart failure medicines.  Can titrate metoprolol as needed for angina.  Will continue as needed nitroglycerin.  For his acute combined systolic and diastolic heart failure, normal filling pressures on right heart cath yesterday.  Have discontinued IV Lasix, will start on p.o. Lasix 20 mg daily.  Also started on Aldactone 25 mg daily, Coreg 3.125 mg daily.  We will add Entresto 24-26 mg daily today.  Donato Heinz, MD

## 2019-07-10 NOTE — Progress Notes (Signed)
Pt.c/o chest pain 7/10; v/s taken B/P=128/97;HR=106;NTG 0.4mg  sl given EKG done;MD on call made aware .Was relieved with one NTG sl ,Will continue to monitor pt.

## 2019-07-11 LAB — COMPREHENSIVE METABOLIC PANEL
ALT: 24 U/L (ref 0–44)
AST: 16 U/L (ref 15–41)
Albumin: 3.4 g/dL — ABNORMAL LOW (ref 3.5–5.0)
Alkaline Phosphatase: 58 U/L (ref 38–126)
Anion gap: 9 (ref 5–15)
BUN: 13 mg/dL (ref 6–20)
CO2: 28 mmol/L (ref 22–32)
Calcium: 9 mg/dL (ref 8.9–10.3)
Chloride: 100 mmol/L (ref 98–111)
Creatinine, Ser: 0.84 mg/dL (ref 0.61–1.24)
GFR calc Af Amer: >60 mL/min (ref >60)
GFR calc non Af Amer: >60 mL/min (ref >60)
Glucose, Bld: 192 mg/dL — ABNORMAL HIGH (ref 70–99)
Potassium: 3.6 mmol/L (ref 3.5–5.1)
Sodium: 137 mmol/L (ref 135–145)
Total Bilirubin: 0.4 mg/dL (ref 0.3–1.2)
Total Protein: 6.7 g/dL (ref 6.5–8.1)

## 2019-07-11 LAB — CBC WITH DIFFERENTIAL/PLATELET
Abs Immature Granulocytes: 0.04 10*3/uL (ref 0.00–0.07)
Basophils Absolute: 0.1 10*3/uL (ref 0.0–0.1)
Basophils Relative: 1 %
Eosinophils Absolute: 0.3 10*3/uL (ref 0.0–0.5)
Eosinophils Relative: 3 %
HCT: 50.7 % (ref 39.0–52.0)
Hemoglobin: 15.7 g/dL (ref 13.0–17.0)
Immature Granulocytes: 0 %
Lymphocytes Relative: 34 %
Lymphs Abs: 3.2 10*3/uL (ref 0.7–4.0)
MCH: 23.8 pg — ABNORMAL LOW (ref 26.0–34.0)
MCHC: 31 g/dL (ref 30.0–36.0)
MCV: 76.8 fL — ABNORMAL LOW (ref 80.0–100.0)
Monocytes Absolute: 0.9 10*3/uL (ref 0.1–1.0)
Monocytes Relative: 9 %
Neutro Abs: 4.8 10*3/uL (ref 1.7–7.7)
Neutrophils Relative %: 53 %
Platelets: 285 10*3/uL (ref 150–400)
RBC: 6.6 MIL/uL — ABNORMAL HIGH (ref 4.22–5.81)
RDW: 13.4 % (ref 11.5–15.5)
WBC: 9.4 10*3/uL (ref 4.0–10.5)
nRBC: 0 % (ref 0.0–0.2)

## 2019-07-11 LAB — MAGNESIUM: Magnesium: 2 mg/dL (ref 1.7–2.4)

## 2019-07-11 LAB — PHOSPHORUS: Phosphorus: 3.7 mg/dL (ref 2.5–4.6)

## 2019-07-11 LAB — GLUCOSE, CAPILLARY
Glucose-Capillary: 139 mg/dL — ABNORMAL HIGH (ref 70–99)
Glucose-Capillary: 130 mg/dL — ABNORMAL HIGH (ref 70–99)
Glucose-Capillary: 124 mg/dL — ABNORMAL HIGH (ref 70–99)

## 2019-07-11 MED ORDER — INSULIN ASPART 100 UNIT/ML FLEXPEN: 8.0000 [IU] | PEN_INJECTOR | Freq: Three times a day (TID) | SUBCUTANEOUS | 3 refills | Status: DC

## 2019-07-11 MED ORDER — METOPROLOL SUCCINATE ER 25 MG PO TB24: 25.0000 mg | ORAL_TABLET | Freq: Every day | ORAL | 1 refills | Status: DC

## 2019-07-11 MED ORDER — ASPIRIN 81 MG PO TBEC: 81.0000 mg | DELAYED_RELEASE_TABLET | Freq: Every day | ORAL | 1 refills | Status: AC

## 2019-07-11 MED ORDER — INSULIN GLARGINE 100 UNIT/ML SOLOSTAR PEN: 25.0000 [IU] | PEN_INJECTOR | Freq: Two times a day (BID) | SUBCUTANEOUS | 3 refills | Status: DC

## 2019-07-11 MED ORDER — CLOPIDOGREL BISULFATE 75 MG PO TABS: 75.0000 mg | ORAL_TABLET | Freq: Every day | ORAL | 1 refills | Status: AC

## 2019-07-11 MED ORDER — ATORVASTATIN CALCIUM 80 MG PO TABS: 80.0000 mg | ORAL_TABLET | Freq: Every day | ORAL | 1 refills | Status: DC

## 2019-07-11 MED ORDER — FUROSEMIDE 20 MG PO TABS: 20.0000 mg | ORAL_TABLET | Freq: Every day | ORAL | 1 refills | Status: DC

## 2019-07-11 MED ORDER — METFORMIN HCL 500 MG PO TABS: 500.0000 mg | ORAL_TABLET | Freq: Every day | ORAL | 1 refills | Status: DC

## 2019-07-11 MED ORDER — SPIRONOLACTONE 25 MG PO TABS: 12.5000 mg | ORAL_TABLET | Freq: Every day | ORAL | 1 refills | Status: DC

## 2019-07-11 MED ORDER — SACUBITRIL-VALSARTAN 24-26 MG PO TABS: 1.0000 | ORAL_TABLET | Freq: Two times a day (BID) | ORAL | 0 refills | Status: DC

## 2019-07-11 MED ORDER — NITROGLYCERIN 0.4 MG SL SUBL: 0.4000 mg | SUBLINGUAL_TABLET | SUBLINGUAL | 0 refills | Status: DC | PRN

## 2019-07-11 MED ORDER — NICOTINE 21 MG/24HR TD PT24: 21.0000 mg | MEDICATED_PATCH | Freq: Every day | TRANSDERMAL | 0 refills | Status: DC

## 2019-07-11 MED FILL — METOPROLOL SUCCINATE ER 25: 25 | 30 days supply | Qty: 30 | Fill #0

## 2019-07-11 MED FILL — ATORVASTATIN CALCIUM 80 MG: 80 | 30 days supply | Qty: 30 | Fill #0

## 2019-07-11 MED FILL — NITROGLYCERIN 0.4 MG TAB SL: 0.4 | 8 days supply | Qty: 25 | Fill #0

## 2019-07-11 MED FILL — PENTIPS 31G X 8 MM MISC: 31G X 8 MM | 30 days supply | Qty: 100 | Fill #0

## 2019-07-11 MED FILL — ASPIRIN LOW DOSE 81 MG TBEC: 81 | 30 days supply | Qty: 30 | Fill #0

## 2019-07-11 MED FILL — ENTRESTO 24 MG-26 MG TABLET: 24-26 | 30 days supply | Qty: 60 | Fill #0

## 2019-07-11 MED FILL — NOVOLOG FLEXPEN SYRINGE: 100 | 30 days supply | Qty: 9 | Fill #0

## 2019-07-11 MED FILL — metFORMIN HCL 500 MG TABS: 500 | 30 days supply | Qty: 30 | Fill #0

## 2019-07-11 MED FILL — LANTUS SOLOSTAR 100 UNITS/M: 100 | 30 days supply | Qty: 15 | Fill #0

## 2019-07-11 MED FILL — CLOPIDOGREL 75 MG TABLET: 75 | 30 days supply | Qty: 30 | Fill #0

## 2019-07-11 MED FILL — SPIRONOLACTONE 25 MG TABLET: 25 | 30 days supply | Qty: 15 | Fill #0

## 2019-07-11 MED FILL — FUROSEMIDE 20 MG TAB: 20 | 30 days supply | Qty: 30 | Fill #0

## 2019-07-11 NOTE — Care Management (Signed)
1241 07-11-19 Scale provided to patient from the [facility]. Gala Lewandowsky , RN,BSN Case Manager

## 2019-07-11 NOTE — Progress Notes (Signed)
Nutrition Follow-up  DOCUMENTATION CODES:   Not applicable  INTERVENTION:  Continue Ensure Max TID   NUTRITION DIAGNOSIS:   Increased nutrient needs related to acute illness(acute unknown type CHF) as evidenced by estimated needs.  Ongoing.  GOAL:   Patient will meet greater than or equal to 90% of their needs  Progressing.  MONITOR:   PO intake, Supplement acceptance, Labs, Weight trends, I & O's  REASON FOR ASSESSMENT:   Consult Diet education  ASSESSMENT:   Pt with a PMH significant of HTN, HLD, CAD, asthma, tobacco use disorder, right inguinal hernia and anxiety presenting with progressive shortness of breath, orthopnea, UTI symptoms and insomnia, and admitted with acute unknown type CHF  Pt reports his appetite is good and that he enjoys the supplements.   PO Intake: 75-100% x last 7 recorded meals   Labs reviewed: CBGs 139-130-124 Medications reviewed and include: Lasix, Novolog, Lantus, Ensure Max TID, Aldactone   Diet Order:   Diet Order            Diet - low sodium heart healthy        Diet - low sodium heart healthy        Diet heart healthy/carb modified Room service appropriate? Yes; Fluid consistency: Thin  Diet effective now              EDUCATION NEEDS:   Education needs have been addressed  Skin:  Skin Assessment: Reviewed RN Assessment  Last BM:  4/22  Height:   Ht Readings from Last 1 Encounters:  07/05/19 5' 6.93" (1.7 m)    Weight:   Wt Readings from Last 1 Encounters:  07/11/19 80.7 kg    BMI:  Body mass index is 27.94 kg/m.  Estimated Nutritional Needs:   Kcal:  2100-2300  Protein:  105-115 grams  Fluid:  >2L/d   Eugene Gavia, MS, RD, LDN RD pager number and weekend/on-call pager number located in Amion.

## 2019-07-11 NOTE — Progress Notes (Addendum)
Progress Note  Patient Name: Larry Lewis Date of Encounter: 07/11/2019  Primary Cardiologist: Little Ishikawa, MD   Subjective   Pt feels well, still with some DOE, improving. Denies chest pain. Is able to rest in the recumbent position.  Inpatient Medications    Scheduled Meds: . aspirin EC  81 mg Oral Daily  . atorvastatin  80 mg Oral Daily  . clopidogrel  75 mg Oral Daily  . enoxaparin (LOVENOX) injection  40 mg Subcutaneous Q24H  . furosemide  20 mg Oral Daily  . insulin aspart  0-20 Units Subcutaneous TID WC  . insulin aspart  0-5 Units Subcutaneous QHS  . insulin aspart  8 Units Subcutaneous TID WC  . insulin glargine  25 Units Subcutaneous BID  . metoprolol succinate  25 mg Oral Daily  . nicotine  21 mg Transdermal Daily  . pneumococcal 23 valent vaccine  0.5 mL Intramuscular Tomorrow-1000  . Ensure Max Protein  11 oz Oral TID  . sacubitril-valsartan  1 tablet Oral BID  . sodium chloride flush  3 mL Intravenous Q12H  . spironolactone  12.5 mg Oral Daily   Continuous Infusions: . sodium chloride     PRN Meds: sodium chloride, acetaminophen, diazepam, nitroGLYCERIN, ondansetron (ZOFRAN) IV, sodium chloride flush   Vital Signs    Vitals:   07/10/19 1555 07/10/19 1632 07/10/19 2040 07/11/19 0510  BP: 103/78 115/89 114/87 114/86  Pulse: (!) 101 (!) 102 (!) 102 93  Resp:   15 17  Temp: 98.4 F (36.9 C)  98.2 F (36.8 C) 98.3 F (36.8 C)  TempSrc: Oral  Oral Oral  SpO2: 96%  95% 100%  Weight:    80.7 kg  Height:       No intake or output data in the 24 hours ending 07/11/19 0954 Last 3 Weights 07/11/2019 07/10/2019 07/09/2019  Weight (lbs) 178 lb 180 lb 6.4 oz 176 lb 3.2 oz  Weight (kg) 80.74 kg 81.829 kg 79.924 kg      Telemetry    Sinus tachycardia in the low 100s - Personally Reviewed  ECG    No new tracings - Personally Reviewed  Physical Exam   GEN: No acute distress.   Neck: No JVD Cardiac: RRR, no murmurs, rubs, or gallops.   Respiratory: Respirations unlabored, coarse sounds throughout GI: Soft, nontender, non-distended  MS: No edema; No deformity. Neuro:  Nonfocal  Psych: Normal affect   Labs    High Sensitivity Troponin:   Recent Labs  Lab 07/04/19 1750 07/04/19 2036  TROPONINIHS 25* 25*      Chemistry Recent Labs  Lab 07/09/19 0435 07/09/19 1144 07/09/19 1201 07/10/19 0421 07/11/19 0522  NA 137   < > 138 135 137  K 4.3   < > 3.7 4.3 3.6  CL 101  --   --  100 100  CO2 25  --   --  22 28  GLUCOSE 180*  --   --  266* 192*  BUN 18  --   --  19 13  CREATININE 0.81  --   --  0.82 0.84  CALCIUM 9.4  --   --  9.2 9.0  PROT  --   --   --   --  6.7  ALBUMIN 3.4*  --   --  3.2* 3.4*  AST  --   --   --   --  16  ALT  --   --   --   --  24  ALKPHOS  --   --   --   --  58  BILITOT  --   --   --   --  0.4  GFRNONAA >60  --   --  >60 >60  GFRAA >60  --   --  >60 >60  ANIONGAP 11  --   --  13 9   < > = values in this interval not displayed.     Hematology Recent Labs  Lab 07/09/19 0435 07/09/19 1144 07/09/19 1201 07/10/19 0421 07/11/19 0522  WBC 9.2  --   --  9.3 9.4  RBC 6.59*  --   --  6.16* 6.60*  HGB 15.9   < > 16.3 14.8 15.7  HCT 50.5   < > 48.0 47.0 50.7  MCV 76.6*  --   --  76.3* 76.8*  MCH 24.1*  --   --  24.0* 23.8*  MCHC 31.5  --   --  31.5 31.0  RDW 13.3  --   --  13.3 13.4  PLT 298  --   --  295 285   < > = values in this interval not displayed.    BNP Recent Labs  Lab 07/04/19 1750  BNP 267.6*     DDimer No results for input(s): DDIMER in the last 168 hours.   Radiology    CARDIAC CATHETERIZATION  Result Date: 07/09/2019  Mid RCA to Dist RCA lesion is 100% stenosed.  Acute Mrg lesion is 90% stenosed.  Ost RCA to Prox RCA lesion is 70% stenosed.  Prox RCA to Mid RCA lesion is 90% stenosed.  1st Mrg lesion is 85% stenosed.  2nd Mrg-1 lesion is 100% stenosed.  2nd Mrg-2 lesion is 100% stenosed.  3rd Mrg lesion is 90% stenosed.  Dist Cx lesion is 70%  stenosed.  Dist LAD lesion is 100% stenosed.  2nd Diag lesion is 95% stenosed.  Mid LAD lesion is 50% stenosed.  1st Diag-2 lesion is 40% stenosed.  1st Diag-1 lesion is 80% stenosed.  Mid Cx to Dist Cx lesion is 30% stenosed.  Severe diffuse multivessel CAD with moderate irregularity of the LAD with 80% proximal diagonal stenosis prior to a previously placed stent with intimal hyperplasia within the stented segment, 50% the stenosis, 95% stenosis in the second diagonal vessel and total occlusion of the apical LAD; left circumflex vessel with large OM1 vessel with 85% stenosis proximal to an aneurysmal segment, total occlusion of the OM 2 vessel with previously placed stent in this vessel and 90 and 70% bifurcation stenoses in the distal circumflex; severely diffusely diseased RCA with distal occlusion.  There is faint filling of a probable small PLA vessel via the left injection. Normal right heart pressures. LVEDP 16 mmHg. RECOMMENDATION: Plan initiate medical therapy since at present he is not on any anti-ischemic medications.  Most vessels are not amenable to intervention; however if patient experiences increasing chest pain symptomatology the large OM1 vessel can potentially undergo intervention with stenting.  Aggressive lipid-lowering therapy with target LDL less than 70.  Optimal blood pressure control.  Smoking cessation is essential.  With diffuse CAD consider DAPT therapy.    Cardiac Studies   Right and left heart cath 07/09/19:  Mid RCA to Dist RCA lesion is 100% stenosed.  Acute Mrg lesion is 90% stenosed.  Ost RCA to Prox RCA lesion is 70% stenosed.  Prox RCA to Mid RCA lesion is 90% stenosed.  1st Mrg lesion is 85% stenosed.  2nd Mrg-1  lesion is 100% stenosed.  2nd Mrg-2 lesion is 100% stenosed.  3rd Mrg lesion is 90% stenosed.  Dist Cx lesion is 70% stenosed.  Dist LAD lesion is 100% stenosed.  2nd Diag lesion is 95% stenosed.  Mid LAD lesion is 50% stenosed.  1st  Diag-2 lesion is 40% stenosed.  1st Diag-1 lesion is 80% stenosed.  Mid Cx to Dist Cx lesion is 30% stenosed.  Severe diffuse multivessel CAD with moderate irregularity of the LAD with 80% proximal diagonal stenosis prior to a previously placed stent with intimal hyperplasia within the stented segment, 50% the stenosis, 95% stenosis in the second diagonal vessel and total occlusion of the apical LAD; left circumflex vessel with large OM1 vessel with 85% stenosis proximal to an aneurysmal segment, total occlusion of the OM 2 vessel with previously placed stent in this vessel and 90 and 70% bifurcation stenoses in the distal circumflex; severely diffusely diseased RCA with distal occlusion. There is faint filling of a probable small PLA vessel via the left injection.  Normal right heart pressures.  LVEDP 16 mmHg.  RECOMMENDATION: Plan initiate medical therapy since at present he is not on any anti-ischemic medications. Most vessels are not amenable to intervention; however if patient experiences increasing chest pain symptomatology the large OM1 vessel can potentially undergo intervention with stenting. Aggressive lipid-lowering therapy with target LDL less than 70. Optimal blood pressure control. Smoking cessation is essential. With diffuse CAD consider DAPT therapy.   Echo 07/06/19: 1. Left ventricular ejection fraction, by estimation, is 35 to 40%. The  left ventricle has moderately decreased function. The left ventricle  demonstrates regional wall motion abnormalities (see scoring  diagram/findings for description). Left ventricular  diastolic parameters are indeterminate.  2. Right ventricular systolic function is normal. The right ventricular  size is normal.  3. Left atrial size was moderately dilated.  4. The mitral valve is normal in structure. Mild mitral valve  regurgitation. No evidence of mitral stenosis.  5. Indeterminate cusp structure. Given age and amount of  calcification,  high suspicion for bicuspid valve, either functional or congenital.. The  aortic valve has an indeterminant number of cusps. Aortic valve  regurgitation is not visualized. Mild to  moderate aortic valve sclerosis/calcification is present, without any  evidence of aortic stenosis.  6. The inferior vena cava is normal in size with greater than 50%  respiratory variability, suggesting right atrial pressure of 3 mmHg.   Patient Profile     54 y.o. male w/ hx CAD(2 stents placed 2007 in [place], [place]), DM, hypertension, hyperlipidemia, asthma, tobacco use, anxietywas admitted 04/16 w/ SOB and CHF.  Assessment & Plan    CAD Acute combined systolic and diastolic heart failure - heart cath with severe diffuse multivessel CAD not amenable to intervention - medical therapy recommended - placed on ASA and plavix - BB switched to tporol 25 mg to allow more BP to titrate CHF medications - low dose entresto started yesterday, spironolactone decreased from 25 mg to 12.5 mg - continue 20 mg lasix daily   Hyperlipidemia 07/07/2019: Cholesterol 274; HDL 34; LDL Cholesterol 211; Triglycerides 145; VLDL 29 - continue 80 mg lipitor - will need repeat labs in 6 weeks - will likely need to proceed to lipid clinic   DM - new diagnosis - A1c 12.2% - has been started on lantus - will defer to primary and PCP to start oral medications   Tobacco abuse - needs to quit smoking   This patient lives in [place],  but does not have a PCP or insurance. He will need help establishing with PCP at [facility]. Will consult CM for help with this and possible [facility] card.   Consider sending medications to [facility] prior to discharge.  [facility] will sign off.   Medication Recommendations:  ASA 81 mg daily, atorvastatin 80 mg daily, plavix 75 mg daily, toprol XL 25 mg daily, entresto 24-26 mg daily, spironolactone 12.5 mg daily, lasix 20 mg daily, SL NTG as needed Other  recommendations (labs, testing, etc):  BMET within 1 week Follow up as an outpatient:  Scheduled f/u for 07/17/19      For questions or updates, please contact [place] Please consult [email or web address] for contact info under     Signed, Ledora Bottcher, PA  07/11/2019, 9:54 AM    Patient seen and examined.  Agree with above documentation.  Denis chest pain, reports dyspnea improved.  On exam, patient is alert and oriented, regular rate and rhythm, no murmurs, lungs CTAB, no LE edema or JVD.  Patient is okay for discharge from cardiac standpoint today.  For his acute combined systolic diastolic heart failure, would discharge on Toprol-XL 25 mg daily, Entresto 24-26 mg daily, and spironolactone 12.5 mg daily, and lasix 20 mg daily.  For his multivessel coronary disease, will discharge on aspirin 81 mg daily, Plavix 75 mg daily, atorvastatin 80 mg daily, and as needed SL nitroglycerin.  Follow-up appointment is scheduled for 4/28.  Donato Heinz, MD

## 2019-07-11 NOTE — Progress Notes (Signed)
Patient ambulated in hallway by [place], NT.  Patient's O2 sats were at 97%.  Will notify Dr. Lowell Guitar.

## 2019-07-11 NOTE — Progress Notes (Signed)
Inpatient Diabetes Program Recommendations  AACE/ADA: New Consensus Statement on Inpatient Glycemic Control (2015)  Target Ranges:  Prepandial:   less than 140 mg/dL      Peak postprandial:   less than 180 mg/dL (1-2 hours)      Critically ill patients:  140 - 180 mg/dL   Lab Results  Component Value Date   GLUCAP 130 (H) 07/11/2019   HGBA1C 12.2 (H) 07/05/2019    Review of Glycemic Control  Diabetes history: DM2 Outpatient Diabetes medications: None Current orders for Inpatient glycemic control: Lantus 25 units bid, novolog 0-20 tidwc, novolog 0-5 qhs, novolog 8 units tidwc  Inpatient Diabetes Program Recommendations:     For discharge, please consider,  -Lantus 50 units daily to start tomorrow morning -Novolog 8 unit tidwc   Note: Spoke with patient at bedside.  He administered his Lantus without difficulty.  Provided patient with a glucometer.  Set up meter with correct date and time.  Instructed patient on how to use glucometer and asked him to check his blood sugar AC/HS.  Asked him to bring meter to PCP appointment.  TOC has assisted patient with setting up an appointment at [facility] as he is uninsured.  Reviewed insulin pen administration and hypoglycemia signa and symptoms and treatment.  Education attached to AVS.    Thank you, Dulce Sellar, RN, BSN Diabetes Coordinator Inpatient Diabetes Program [phone] (team pager from 8a-5p)

## 2019-07-11 NOTE — Discharge Instructions (Signed)
Carbohydrate Counting For People With Diabetes  Foods with carbohydrates make your blood glucose level go up. Learning how to count carbohydrates can help you control your blood glucose levels. First, identify the foods you eat that contain carbohydrates. Then, using the Foods with Carbohydrates chart, determine about how much carbohydrates are in your meals and snacks. Make sure you are eating foods with fiber, protein, and healthy fat along with your carbohydrate foods. Foods with Carbohydrates The following table shows carbohydrate foods that have about 15 grams of carbohydrate each. Using measuring cups, spoons, or a food scale when you first begin learning about carbohydrate counting can help you learn about the portion sizes you typically eat. The following foods have 15 grams carbohydrate each:  Grains . 1 slice bread (1 ounce)  . 1 small tortilla (6-inch size)  .  large bagel (1 ounce)  . 1/3 cup pasta or rice (cooked)  .  hamburger or hot dog bun ( ounce)  .  cup cooked cereal  .  to  cup ready-to-eat cereal  . 2 taco shells (5-inch size) Fruit . 1 small fresh fruit ( to 1 cup)  .  medium banana  . 17 small grapes (3 ounces)  . 1 cup melon or berries  .  cup canned or frozen fruit  . 2 tablespoons dried fruit (blueberries, cherries, cranberries, raisins)  .  cup unsweetened fruit juice  Starchy Vegetables .  cup cooked beans, peas, corn, potatoes/sweet potatoes  .  large baked potato (3 ounces)  . 1 cup acorn or butternut squash  Snack Foods . 3 to 6 crackers  . 8 potato chips or 13 tortilla chips ( ounce to 1 ounce)  . 3 cups popped popcorn  Dairy . 3/4 cup (6 ounces) nonfat plain yogurt, or yogurt with sugar-free sweetener  . 1 cup milk  . 1 cup plain rice, soy, coconut or flavored almond milk Sweets and Desserts .  cup ice cream or frozen yogurt  . 1 tablespoon jam, jelly, pancake syrup, table sugar, or honey  . 2 tablespoons light pancake syrup  . 1 inch  square of frosted cake or 2 inch square of unfrosted cake  . 2 small cookies (2/3 ounce each) or  large cookie  Sometimes you'll have to estimate carbohydrate amounts if you don't know the exact recipe. One cup of mixed foods like soups can have 1 to 2 carbohydrate servings, while some casseroles might have 2 or more servings of carbohydrate. Foods that have less than 20 calories in each serving can be counted as "free" foods. Count 1 cup raw vegetables, or  cup cooked non-starchy vegetables as "free" foods. If you eat 3 or more servings at one meal, then count them as 1 carbohydrate serving.  Foods without Carbohydrates  Not all foods contain carbohydrates. Meat, some dairy, fats, non-starchy vegetables, and many beverages don't contain carbohydrate. So when you count carbohydrates, you can generally exclude chicken, pork, beef, fish, seafood, eggs, tofu, cheese, butter, sour cream, avocado, nuts, seeds, olives, mayonnaise, water, black coffee, unsweetened tea, and zero-calorie drinks. Vegetables with no or low carbohydrate include green beans, cauliflower, tomatoes, and onions. How much carbohydrate should I eat at each meal?  Carbohydrate counting can help you plan your meals and manage your weight. Following are some starting points for carbohydrate intake at each meal. Work with your registered dietitian nutritionist to find the best range that works for your blood glucose and weight.   To Lose Weight To  Maintain Weight  Women 2 - 3 carb servings 3 - 4 carb servings  Men 3 - 4 carb servings 4 - 5 carb servings  Checking your blood glucose after meals will help you know if you need to adjust the timing, type, or number of carbohydrate servings in your meal plan. Achieve and keep a healthy body weight by balancing your food intake and physical activity.  Tips How should I plan my meals?  Plan for half the food on your plate to include non-starchy vegetables, like salad greens, broccoli, or  carrots. Try to eat 3 to 5 servings of non-starchy vegetables every day. Have a protein food at each meal. Protein foods include chicken, fish, meat, eggs, or beans (note that beans contain carbohydrate). These two food groups (non-starchy vegetables and proteins) are low in carbohydrate. If you fill up your plate with these foods, you will eat less carbohydrate but still fill up your stomach. Try to limit your carbohydrate portion to  of the plate.  What fats are healthiest to eat?  Diabetes increases risk for heart disease. To help protect your heart, eat more healthy fats, such as olive oil, nuts, and avocado. Eat less saturated fats like butter, cream, and high-fat meats, like bacon and sausage. Avoid trans fats, which are in all foods that list "partially hydrogenated oil" as an ingredient. What should I drink?  Choose drinks that are not sweetened with sugar. The healthiest choices are water, carbonated or seltzer waters, and tea and coffee without added sugars.  Sweet drinks will make your blood glucose go up very quickly. One serving of soda or energy drink is  cup. It is best to drink these beverages only if your blood glucose is low.  Artificially sweetened, or diet drinks, typically do not increase your blood glucose if they have zero calories in them. Read labels of beverages, as some diet drinks do have carbohydrate and will raise your blood glucose. Label Reading Tips Read Nutrition Facts labels to find out how many grams of carbohydrate are in a food you want to eat. Don't forget: sometimes serving sizes on the label aren't the same as how much food you are going to eat, so you may need to calculate how much carbohydrate is in the food you are serving yourself.   Carbohydrate Counting for People with Diabetes Sample 1-Day Menu  Breakfast  cup yogurt, low fat, low sugar (1 carbohydrate serving)   cup cereal, ready-to-eat, unsweetened (1 carbohydrate serving)  1 cup strawberries (1  carbohydrate serving)   cup almonds ( carbohydrate serving)  Lunch 1, 5 ounce can chunk light tuna  2 ounces cheese, low fat cheddar  6 whole wheat crackers (1 carbohydrate serving)  1 small apple (1 carbohydrate servings)   cup carrots ( carbohydrate serving)   cup snap peas  1 cup 1% milk (1 carbohydrate serving)   Evening Meal Stir fry made with: 3 ounces chicken  1 cup brown rice (3 carbohydrate servings)   cup broccoli ( carbohydrate serving)   cup green beans   cup onions  1 tablespoon olive oil  2 tablespoons teriyaki sauce ( carbohydrate serving)  Evening Snack 1 extra small banana (1 carbohydrate serving)  1 tablespoon peanut butter   Carbohydrate Counting for People with Diabetes Vegan Sample 1-Day Menu  Breakfast 1 cup cooked oatmeal (2 carbohydrate servings)   cup blueberries (1 carbohydrate serving)  2 tablespoons flaxseeds  1 cup soymilk fortified with calcium and vitamin D  1  cup coffee  Lunch 2 slices whole wheat bread (2 carbohydrate servings)   cup baked tofu   cup lettuce  2 slices tomato  2 slices avocado   cup baby carrots ( carbohydrate serving)  1 orange (1 carbohydrate serving)  1 cup soymilk fortified with calcium and vitamin D   Evening Meal Burrito made with: 1 6-inch corn tortilla (1 carbohydrate serving)  1 cup refried vegetarian beans (2 carbohydrate servings)   cup chopped tomatoes   cup lettuce   cup salsa  1/3 cup brown rice (1 carbohydrate serving)  1 tablespoon olive oil for rice   cup zucchini   Evening Snack 6 small whole grain crackers (1 carbohydrate serving)  2 apricots ( carbohydrate serving)   cup unsalted peanuts ( carbohydrate serving)    Carbohydrate Counting for People with Diabetes Vegetarian (Lacto-Ovo) Sample 1-Day Menu  Breakfast 1 cup cooked oatmeal (2 carbohydrate servings)   cup blueberries (1 carbohydrate serving)  2 tablespoons flaxseeds  1 egg  1 cup 1% milk (1 carbohydrate serving)  1  cup coffee  Lunch 2 slices whole wheat bread (2 carbohydrate servings)  2 ounces low-fat cheese   cup lettuce  2 slices tomato  2 slices avocado   cup baby carrots ( carbohydrate serving)  1 orange (1 carbohydrate serving)  1 cup unsweetened tea  Evening Meal Burrito made with: 1 6-inch corn tortilla (1 carbohydrate serving)   cup refried vegetarian beans (1 carbohydrate serving)   cup tomatoes   cup lettuce   cup salsa  1/3 cup brown rice (1 carbohydrate serving)  1 tablespoon olive oil for rice   cup zucchini  1 cup 1% milk (1 carbohydrate serving)  Evening Snack 6 small whole grain crackers (1 carbohydrate serving)  2 apricots ( carbohydrate serving)   cup unsalted peanuts ( carbohydrate serving)    Copyright 2020  Academy of Nutrition and Dietetics. All rights reserved.  Using Nutrition Labels: Carbohydrate  . Serving Size  . Look at the serving size. All the information on the label is based on this portion. Jolyne Loa Per Container  . The number of servings contained in the package. . Guidelines for Carbohydrate  . Look at the total grams of carbohydrate in the serving size.  . 1 carbohydrate choice = 15 grams of carbohydrate. Range of Carbohydrate Grams Per Choice  Carbohydrate Grams/Choice Carbohydrate Choices  6-10   11-20 1  21-25 1  26-35 2  36-40 2  41-50 3  51-55 3  56-65 4  66-70 4  71-80 5    Copyright 2020  Academy of Nutrition and Dietetics. All rights reserved.   Heart Failure, Diagnosis  Heart failure means that your heart is not able to pump blood in the right way. This makes it hard for your body to work well. Heart failure is usually a long-term (chronic) condition. You must take good care of yourself and follow your treatment plan from your doctor. What are the causes? This condition may be caused by:  High blood pressure.  Build up of cholesterol and fat in the arteries.  Heart attack. This injures the heart  muscle.  Heart valves that do not open and close properly.  Damage of the heart muscle. This is also called cardiomyopathy.  Lung disease.  Abnormal heart rhythms. What increases the risk? The risk of heart failure goes up as a person ages. This condition is also more likely to develop in people who:  Are overweight.  Are  male.  Smoke or chew tobacco.  Abuse alcohol or illegal drugs.  Have taken medicines that can damage the heart.  Have diabetes.  Have abnormal heart rhythms.  Have thyroid problems.  Have low blood counts (anemia). What are the signs or symptoms? Symptoms of this condition include:  Shortness of breath.  Coughing.  Swelling of the feet, ankles, legs, or belly.  Losing weight for no reason.  Trouble breathing.  Waking from sleep because of the need to sit up and get more air.  Rapid heartbeat.  Being very tired.  Feeling dizzy, or feeling like you may pass out (faint).  Having no desire to eat.  Feeling like you may vomit (nauseous).  Peeing (urinating) more at night.  Feeling confused. How is this treated?     This condition may be treated with:  Medicines. These can be given to treat blood pressure and to make the heart muscles stronger.  Changes in your daily life. These may include eating a healthy diet, staying at a healthy body weight, quitting tobacco and illegal drug use, or doing exercises.  Surgery. Surgery can be done to open blocked valves, or to put devices in the heart, such as pacemakers.  A donor heart (heart transplant). You will receive a healthy heart from a donor. Follow these instructions at home:  Treat other conditions as told by your doctor. These may include high blood pressure, diabetes, thyroid disease, or abnormal heart rhythms.  Learn as much as you can about heart failure.  Get support as you need it.  Keep all follow-up visits as told by your doctor. This is important. Summary  Heart  failure means that your heart is not able to pump blood in the right way.  This condition is caused by high blood pressure, heart attack, or damage of the heart muscle.  Symptoms of this condition include shortness of breath and swelling of the feet, ankles, legs, or belly. You may also feel very tired or feel like you may vomit.  You may be treated with medicines, surgery, or changes in your daily life.  Treat other health conditions as told by your doctor. This information is not intended to replace advice given to you by your health care provider. Make sure you discuss any questions you have with your health care provider. Document Revised: 05/25/2018 Document Reviewed: 05/25/2018 Elsevier Patient Education  2020 Elsevier Inc.    Cooking With Less [employer] with less salt is one way to reduce the amount of sodium you get from food. Depending on your condition and overall health, your health care provider or diet and nutrition specialist (dietitian) may recommend that you reduce your sodium intake. Most people should have less than 2,300 milligrams (mg) of sodium each day. If you have high blood pressure (hypertension), you may need to limit your sodium to 1,500 mg each day. Follow the tips below to help reduce your sodium intake. What do I need to know about cooking with less salt? Shopping  Buy sodium-free or low-sodium products. Look for the following words on food labels: ? Low-sodium. ? Sodium-free. ? Reduced-sodium. ? No salt added. ? Unsalted.  Buy fresh or frozen vegetables. Avoid canned vegetables.  Avoid buying meats or protein foods that have been injected with broth or saline solution.  Avoid cured or smoked meats, such as hot dogs, bacon, salami, ham, and bologna. Reading food labels   Check the food label before buying or using packaged ingredients.  Look for products  with no more than 140 mg of sodium in one serving.  Do not choose foods with salt as one of  the first three ingredients on the ingredients list. If salt is one of the first three ingredients, it usually means the item is high in sodium, because ingredients are listed in order of amount in the food item. Cooking  Use herbs, seasonings without salt, and spices as substitutes for salt in foods.  Use sodium-free baking soda when baking.  Grill, braise, or roast foods to add flavor with less salt.  Avoid adding salt to pasta, rice, or hot cereals while cooking.  Drain and rinse canned vegetables before use.  Avoid adding salt when cooking sweets and desserts.  Cook with low-sodium ingredients. What are some salt alternatives? The following are herbs, seasonings, and spices that can be used instead of salt to give taste to your food. Herbs should be fresh or dried. Do not choose packaged mixes. Next to the name of the herb, spice, or seasoning are some examples of foods you can pair it with. Herbs  Bay leaves - Soups, meat and vegetable dishes, and spaghetti sauce.  Basil - [employer], soups, pasta, and fish dishes.  Cilantro - Meat, poultry, and vegetable dishes.  Chili powder - Marinades and Mexican dishes.  Chives - Salad dressings and potato dishes.  Cumin - Mexican dishes, couscous, and meat dishes.  Dill - Fish dishes, sauces, and salads.  Fennel - Meat and vegetable dishes, breads, and cookies.  Garlic (do not use garlic salt) - [place] dishes, meat dishes, salad dressings, and sauces.  Marjoram - Soups, potato dishes, and meat dishes.  Oregano - Pizza and spaghetti sauce.  Parsley - Salads, soups, pasta, and meat dishes.  Rosemary - [place] dishes, salad dressings, soups, and red meats.  Saffron - Fish dishes, pasta, and some poultry dishes.  Sage - Stuffings and sauces.  Tarragon - Fish and [employer].  Thyme - Stuffing, meat, and fish dishes. Seasonings  Lemon juice - Fish dishes, poultry dishes, vegetables, and salads.  Vinegar - Salad  dressings, vegetables, and fish dishes. Spices  Cinnamon - Sweet dishes, such as cakes, cookies, and puddings.  Cloves - Gingerbread, puddings, and marinades for meats.  Curry - Vegetable dishes, fish and poultry dishes, and stir-fry dishes.  Ginger - Vegetables dishes, fish dishes, and stir-fry dishes.  Nutmeg - Pasta, vegetables, poultry, fish dishes, and custard. What are some low-sodium ingredients and foods?  Fresh or frozen fruits and vegetables with no sauce added.  Fresh or frozen whole meats, poultry, and fish with no sauce added.  Eggs.  Noodles, pasta, quinoa, rice.  Shredded or puffed wheat or puffed rice.  Regular or quick oats.  Milk, yogurt, hard cheeses, and low-sodium cheeses. Good cheese choices include Swiss, [place]. Always check the label for the serving size and sodium content.  Unsalted butter or margarine.  Unsalted nuts.  Sherbet or ice cream (keep to  cup per serving).  Homemade pudding.  Sodium-free baking soda and baking powder. This is not a complete list of low-sodium ingredients and foods. Contact your dietitian for more options. Summary  Cooking with less salt is one way to reduce the amount of sodium that you get from food.  Buy sodium-free or low-sodium products.  Check the food label before using or buying packaged ingredients.  Use herbs, seasonings without salt, and spices as substitutes for salt in foods. This information is not intended to replace advice  given to you by your health care provider. Make sure you discuss any questions you have with your health care provider. Document Revised: 02/17/2017 Document Reviewed: 03/15/2016 Elsevier Patient Education  2020 Elsevier Inc.   Radial Site Care  This sheet gives you information about how to care for yourself after your procedure. Your health care provider may also give you more specific instructions. If you have problems or questions, contact your  health care provider. What can I expect after the procedure? After the procedure, it is common to have:  Bruising and tenderness at the catheter insertion area. Follow these instructions at home: Medicines  Take over-the-counter and prescription medicines only as told by your health care provider. Insertion site care  Follow instructions from your health care provider about how to take care of your insertion site. Make sure you: ? Wash your hands with soap and water before you change your bandage (dressing). If soap and water are not available, use hand sanitizer. ? Change your dressing as told by your health care provider. ? Leave stitches (sutures), skin glue, or adhesive strips in place. These skin closures may need to stay in place for 2 weeks or longer. If adhesive strip edges start to loosen and curl up, you may trim the loose edges. Do not remove adhesive strips completely unless your health care provider tells you to do that.  Check your insertion site every day for signs of infection. Check for: ? Redness, swelling, or pain. ? Fluid or blood. ? Pus or a bad smell. ? Warmth.  Do not take baths, swim, or use a hot tub until your health care provider approves.  You may shower 24-48 hours after the procedure, or as directed by your health care provider. ? Remove the dressing and gently wash the site with plain soap and water. ? Pat the area dry with a clean towel. ? Do not rub the site. That could cause bleeding.  Do not apply powder or lotion to the site. Activity   For 24 hours after the procedure, or as directed by your health care provider: ? Do not flex or bend the affected arm. ? Do not push or pull heavy objects with the affected arm. ? Do not drive yourself home from the hospital or clinic. You may drive 24 hours after the procedure unless your health care provider tells you not to. ? Do not operate machinery or power tools.  Do not lift anything that is heavier  than 10 lb (4.5 kg), or the limit that you are told, until your health care provider says that it is safe.  Ask your health care provider when it is okay to: ? Return to work or school. ? Resume usual physical activities or sports. ? Resume sexual activity. General instructions  If the catheter site starts to bleed, raise your arm and put firm pressure on the site. If the bleeding does not stop, get help right away. This is a medical emergency.  If you went home on the same day as your procedure, a responsible adult should be with you for the first 24 hours after you arrive home.  Keep all follow-up visits as told by your health care provider. This is important. Contact a health care provider if:  You have a fever.  You have redness, swelling, or yellow drainage around your insertion site. Get help right away if:  You have unusual pain at the radial site.  The catheter insertion area swells very fast.  The insertion area is bleeding, and the bleeding does not stop when you hold steady pressure on the area.  Your arm or hand becomes pale, cool, tingly, or numb. These symptoms may represent a serious problem that is an emergency. Do not wait to see if the symptoms will go away. Get medical help right away. Call your local emergency services (911 in the [place] Do not drive yourself to the hospital. Summary  After the procedure, it is common to have bruising and tenderness at the site.  Follow instructions from your health care provider about how to take care of your radial site wound. Check the wound every day for signs of infection.  Do not lift anything that is heavier than 10 lb (4.5 kg), or the limit that you are told, until your health care provider says that it is safe. This information is not intended to replace advice given to you by your health care provider. Make sure you discuss any questions you have with your health care provider. Document Revised: 04/12/2017 Document  Reviewed: 04/12/2017 Elsevier Patient Education  2020 [employer].

## 2019-07-11 NOTE — Discharge Summary (Signed)
Physician Discharge Summary  [name] ZOX:[id number] DOB: March 02, 1966 DOA: 07/04/2019  PCP: Patient, No Pcp Per  Admit date: 07/04/2019 Discharge date: 07/11/2019  Time spent: 40 minutes  Recommendations for Outpatient Follow-up:  1. Follow outpatient CBC/CMP within 1 week 2. Follow diabetes management - discharged with insulin/metformin - given glucometer 3. Follow new HF/CAD medications 4. Follow with PCP outpatient 5. Follow with cardiology outpatient   6. Follow chest x ray outpatient for bilateral effusions  Discharge Diagnoses:  Principal Problem:   Acute respiratory failure with hypoxia (HCC) Active Problems:   Acute exacerbation of CHF (congestive heart failure) (HCC)   Uncontrolled type 2 diabetes mellitus with hyperglycemia (HCC)   Tobacco abuse   Bilateral pleural effusion   Coronary artery disease involving native coronary artery of native heart with angina pectoris [facility])  Discharge Condition: stable  Diet recommendation: heart healthy/diabetic  Filed Weights   07/09/19 0537 07/10/19 0500 07/11/19 0510  Weight: 79.9 kg 81.8 kg 80.7 kg   History of present illness:  54 year old male with history of HTN, HLD, CAD, asthma, tobacco use disorder, right inguinal hernia and anxiety presenting with progressive shortness of breath, orthopnea, UTI symptoms and insomnia, and admitted with acute unknown type CHF.  In ED, 100-1 110s. RR 20s to 30s. Desaturated to 88% with ambulation on RA requiring 4 L to recover. Glucose 332. MCV 77.7. Otherwise, CBC and BMP without significant finding. BNP 267. HS Trop 25x2. EKG with sinus tachycardia, J-point elevation in anterior leads and T wave changes in lateral leads. CXR with cardiomegaly and pulmonary vascular congestion. COVID-19 PCR and influenza PCR negative. CTA chest negative for PE but moderate bilateral pleural effusions, dependent atelectasis, cardiomegaly with prominent LA and LV dilation, mediastinal adenopathy and  cholelithiasis. Started on IV Lasix and 10 units of insulin and admitted for CHF exacerbation. Continued on IV Lasix 40 mg twice daily.   TTE with EF of 35 to 40%, RWMA, indeterminate diastolic functions, moderate LAE with moderate aortic valve sclerosis/calcification and indeterminate cuspid structures. Cardiology consulted on 4/18. R/LHC on 4/20 with diffuse CAD and normal right heart pressure.   He was improved with medical treatment of his HF.  He was started on insulin for his diabetes.  Discharged on 4/22 with plans for outpatient follow up.  See details below.  Hospital Course:  Acute respiratory failure with hypoxia due to unknown type and acutecombinedCHF: TTE with EF of 35 to 40%, RWMA, indeterminate diastolic functions, moderate LAE with moderate aortic valve sclerosis/calcification and indeterminate cuspid structures. No previous echo in chart. CXR, CTA and BNP consistent with acute CHF. R/LHC with diffuse multivessel CAD (see report).. -Cardiologymanaging, appreciate recs -transitioned to oral lasix, spironolactone, entresto, metoprolol  Elevated troponin/history of CAD s/pstentsin 2007: Likely demand ischemia due to acute CHF. EKG with sinus tachycardia, J-point elevation in anterior leads and nonspecific T wave changes in lateral leads. Currently without chest pain. TTE with EF 35-40%, RWMA, LV diastolic parameters indeterminate.  R/LHC with severe diffuse diffuse multivessel CAD (see report).  Recommended medical therapy.   -Cardiologymanaging - continue asa, plavix, metoprolol, atorvastatin -Manage CHF as above  Moderate bilateral pleural effusion: Likely due to CHF. -follow outpatient CXR  Mediastinal lymphadenopathy: Likely reactive from CHF -Manage as above  Uncontrolled DM-2 with hyperglycemia: A1c 12.2%. Not on medication at home. -lantus 25 units BID -Continue NovoLog 8 units AC -metformin -Continue atorvastatin80 mg daily -follow  outpatient  Essential hypertension: Normotensive. Not on medication at home. -follow blood pressure with new medications  Tobacco use disorder -Encouragedcessation. -Nicotine patch.  Hypokalemia:Resolved. -Recheck of replenished.  Hyperlipidemia: LDL 211. -Continue atorvastatin 80 mg daily  Chronic right inguinal hernia/rectus diastases-no incarceration. -Continue monitoring - follow outpatient   Nutrition Problem: Increased nutrient needs Etiology: acute illness(acute unknown type CHF)  Signs/Symptoms: estimated needs  Interventions: [employer], Education  Procedures: Cath  Mid RCA to Dist RCA lesion is 100% stenosed.  Acute Mrg lesion is 90% stenosed.  Ost RCA to Prox RCA lesion is 70% stenosed.  Prox RCA to Mid RCA lesion is 90% stenosed.  1st Mrg lesion is 85% stenosed.  2nd Mrg-1 lesion is 100% stenosed.  2nd Mrg-2 lesion is 100% stenosed.  3rd Mrg lesion is 90% stenosed.  Dist Cx lesion is 70% stenosed.  Dist LAD lesion is 100% stenosed.  2nd Diag lesion is 95% stenosed.  Mid LAD lesion is 50% stenosed.  1st Diag-2 lesion is 40% stenosed.  1st Diag-1 lesion is 80% stenosed.  Mid Cx to Dist Cx lesion is 30% stenosed.   Severe diffuse multivessel CAD with moderate irregularity of the LAD with 80% proximal diagonal stenosis prior to Oceane Fosse previously placed stent with intimal hyperplasia within the stented segment, 50% the stenosis, 95% stenosis in the second diagonal vessel and total occlusion of the apical LAD; left circumflex vessel with large OM1 vessel with 85% stenosis proximal to an aneurysmal segment, total occlusion of the OM 2 vessel with previously placed stent in this vessel and 90 and 70% bifurcation stenoses in the distal circumflex; severely diffusely diseased RCA with distal occlusion.  There is faint filling of Jamian Andujo probable small PLA vessel via the left injection.  Normal right heart pressures.  LVEDP 16  mmHg.  RECOMMENDATION: Plan initiate medical therapy since at present he is not on any anti-ischemic medications.  Most vessels are not amenable to intervention; however if patient experiences increasing chest pain symptomatology the large OM1 vessel can potentially undergo intervention with stenting.  Aggressive lipid-lowering therapy with target LDL less than 70.  Optimal blood pressure control.  Smoking cessation is essential.  With diffuse CAD consider DAPT therapy.  Echo IMPRESSIONS    1. Left ventricular ejection fraction, by estimation, is 35 to 40%. The  left ventricle has moderately decreased function. The left ventricle  demonstrates regional wall motion abnormalities (see scoring  diagram/findings for description). Left ventricular  diastolic parameters are indeterminate.  2. Right ventricular systolic function is normal. The right ventricular  size is normal.  3. Left atrial size was moderately dilated.  4. The mitral valve is normal in structure. Mild mitral valve  regurgitation. No evidence of mitral stenosis.  5. Indeterminate cusp structure. Given age and amount of calcification,  high suspicion for bicuspid valve, either functional or congenital.. The  aortic valve has an indeterminant number of cusps. Aortic valve  regurgitation is not visualized. Mild to  moderate aortic valve sclerosis/calcification is present, without any  evidence of aortic stenosis.  6. The inferior vena cava is normal in size with greater than 50%  respiratory variability, suggesting right atrial pressure of 3 mmHg.   Consultations:  cardiology  Discharge Exam: Vitals:   07/11/19 1039 07/11/19 1150  BP: 113/82   Pulse: (!) 101   Resp:    Temp:    SpO2:  97%   Arabic interpreter used No new complaints Discussed d/c instructions  General: No acute distress. Cardiovascular: Heart sounds show Tonie Vizcarrondo regular rate, and rhythm.  Lungs: Clear to auscultation bilaterally. Abdomen:  Soft, nontender, nondistended  Neurological: Alert and oriented 3. Moves all extremities 4 . Cranial nerves II through XII grossly intact. Skin: Warm and dry. No rashes or lesions. Extremities: No clubbing or cyanosis. No edema.*.   Discharge Instructions   Discharge Instructions    Call MD for:  difficulty breathing, headache or visual disturbances   Complete by: As directed    Call MD for:  extreme fatigue   Complete by: As directed    Call MD for:  hives   Complete by: As directed    Call MD for:  persistant dizziness or light-headedness   Complete by: As directed    Call MD for:  persistant nausea and vomiting   Complete by: As directed    Call MD for:  redness, tenderness, or signs of infection (pain, swelling, redness, odor or green/yellow discharge around incision site)   Complete by: As directed    Call MD for:  severe uncontrolled pain   Complete by: As directed    Call MD for:  temperature >100.4   Complete by: As directed    Diet - low sodium heart healthy   Complete by: As directed    Diet - low sodium heart healthy   Complete by: As directed    Discharge instructions   Complete by: As directed    You were seen for Selden Noteboom heart failure exacerbation.  You had Jakalyn Kratky catheterization that showed diffuse coronary disease.  You were treated with diuresis and medical management for your coronary disease.  You've been started on several new medicines for your heart disease.  These are very important to take.  Please follow up with cardiology as scheduled.  You have diabetes.  You've been started on insulin.  Continue your insulin as prescribed.  Continue following your blood sugars fasting and before meals.  Write these values down and bring them to your doctor so they can make any necessary adjustments.    Return for new, recurrent, or worsening symptoms.  Please ask your PCP to request records from this hospitalization so they know what was done and what the next steps will  be.   Heart Failure patients record your daily weight using the same scale at the same time of day   Complete by: As directed    Increase activity slowly   Complete by: As directed    Increase activity slowly   Complete by: As directed    STOP any activity that causes chest pain, shortness of breath, dizziness, sweating, or exessive weakness   Complete by: As directed      Allergies as of 07/11/2019      Reactions   Penicillins Itching      Medication List    TAKE these medications   aspirin 81 MG EC tablet Take 1 tablet (81 mg total) by mouth daily. Start taking on: July 12, 2019   atorvastatin 80 MG tablet Commonly known as: LIPITOR Take 1 tablet (80 mg total) by mouth daily. Start taking on: July 12, 2019   clopidogrel 75 MG tablet Commonly known as: PLAVIX Take 1 tablet (75 mg total) by mouth daily. Start taking on: July 12, 2019   furosemide 20 MG tablet Commonly known as: LASIX Take 1 tablet (20 mg total) by mouth daily. Start taking on: July 12, 2019   insulin aspart 100 UNIT/ML FlexPen Commonly known as: NOVOLOG Inject 8 Units into the skin 3 (three) times daily with meals.   insulin glargine 100 UNIT/ML Solostar Pen Commonly known as: LANTUS  Inject 25 Units into the skin 2 (two) times daily.   metFORMIN 500 MG tablet Commonly known as: Glucophage Take 1 tablet (500 mg total) by mouth daily with breakfast.   metoprolol succinate 25 MG 24 hr tablet Commonly known as: TOPROL-XL Take 1 tablet (25 mg total) by mouth daily. Start taking on: July 12, 2019   nicotine 21 mg/24hr patch Commonly known as: NICODERM CQ - dosed in mg/24 hours Place 1 patch (21 mg total) onto the skin daily. Start taking on: July 12, 2019   nitroGLYCERIN 0.4 MG SL tablet Commonly known as: NITROSTAT Place 1 tablet (0.4 mg total) under the tongue every 5 (five) minutes x 3 doses as needed for chest pain.   sacubitril-valsartan 24-26 MG Commonly known as: ENTRESTO Take 1  tablet by mouth 2 (two) times daily.   spironolactone 25 MG tablet Commonly known as: ALDACTONE Take 0.5 tablets (12.5 mg total) by mouth daily. Start taking on: July 12, 2019      Allergies  Allergen Reactions  . Penicillins Itching   Follow-up Information    Little Ishikawa, MD Follow up on 07/17/2019.   Specialty: Cardiology Why: 11:00 am Contact information: [address] [place] [address] [phone]        [facility]. Go on 08/01/2019.   Why: at 2:30pm for hospital followup Contact information: [place] [phone]       [facility]. Go to.   Why: this location for medication assistance post hospitalization for additional medication assistance. This location can assist you with the [facility] Patient Assistance Application- Most medications range from $4.00-$10.00 Contact information: [address] [facility] [address] [phone]           The results of significant diagnostics from this hospitalization (including imaging, microbiology, ancillary and laboratory) are listed below for reference.    Significant Diagnostic Studies: CT Angio Chest PE W/Cm &/Or Wo Cm  Result Date: 07/04/2019 CLINICAL DATA:  Shortness of breath EXAM: CT ANGIOGRAPHY CHEST WITH CONTRAST TECHNIQUE: Multidetector CT imaging of the chest was performed using the standard protocol during bolus administration of intravenous contrast. Multiplanar CT image reconstructions and MIPs were obtained to evaluate the vascular anatomy. CONTRAST:  OMNIPAQUE IOHEXOL 350 MG/ML SOLN COMPARISON:  07/04/2019 FINDINGS: Cardiovascular: This is Paulett Kaufhold technically adequate evaluation of the pulmonary vasculature. No filling defects or pulmonary emboli. There is prominent left ventricular and left atrial dilatation. No pericardial effusion. Mild  atherosclerosis of the aorta and coronary vessels. No evidence of thoracic aortic aneurysm or dissection. Mediastinum/Nodes: Subcarinal adenopathy measuring up to 14 mm in short axis. Borderline enlarged pretracheal nodes. These are nonspecific. Thyroid, trachea, and esophagus are unremarkable. Lungs/Pleura: There are moderate bilateral pleural effusions, estimated 1 L each. Areas of compressive atelectasis are seen within the dependent lower lobes. No airspace disease or pneumothorax. Central airways are patent. Upper Abdomen: Calcified gallstone is identified.  No cholecystitis. Musculoskeletal: No acute or destructive bony lesions. Reconstructed images demonstrate no additional findings. Review of the MIP images confirms the above findings. IMPRESSION: 1. No evidence of pulmonary embolus. 2. Moderate bilateral pleural effusions and dependent atelectasis. 3. Cardiomegaly, with prominent left atrial and left ventricular dilatation. 4. Likely reactive mediastinal adenopathy. 5.  Aortic Atherosclerosis (ICD10-I70.0). 6. Cholelithiasis. Electronically Signed   By: Sharlet Salina M.D.   On: 07/04/2019 21:58   US PELVIS LIMITED (TRANSABDOMINAL ONLY)  Result Date: 07/05/2019  CLINICAL DATA:  Bulge right inguinal region EXAM: LIMITED ULTRASOUND OF PELVIS TECHNIQUE: Limited transabdominal ultrasound examination of the pelvis was performed. COMPARISON:  10/04/2017 FINDINGS: Sonographic evaluation of the right inguinal region was performed. Codey Burling fat containing right inguinal hernia is identified. On previous CT, Chanay Nugent portion of the bladder dome as well as the appendix extended into the right inguinal hernia. On today's exam, there is equivocal finding of bowel herniation. No free fluid. Normal appearing right inguinal lymph nodes are seen. IMPRESSION: 1. 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 Suspect bowel herniation on today's study. No fluid or inflammatory change. Electronically Signed   By: Randa Ngo M.D.   On: 07/05/2019 22:13   CARDIAC CATHETERIZATION  Result Date: 07/09/2019  Mid RCA to Dist RCA lesion is 100% stenosed.  Acute Mrg lesion is 90% stenosed.  Ost RCA to Prox RCA lesion is 70% stenosed.  Prox RCA to Mid RCA lesion is 90% stenosed.  1st Mrg lesion is 85% stenosed.  2nd Mrg-1 lesion is 100% stenosed.  2nd Mrg-2 lesion is 100% stenosed.  3rd Mrg lesion is 90% stenosed.  Dist Cx lesion is 70% stenosed.  Dist LAD lesion is 100% stenosed.  2nd Diag lesion is 95% stenosed.  Mid LAD lesion is 50% stenosed.  1st Diag-2 lesion is 40% stenosed.  1st Diag-1 lesion is 80% stenosed.  Mid Cx to Dist Cx lesion is 30% stenosed.  Severe diffuse multivessel CAD with moderate irregularity of the LAD with 80% proximal diagonal stenosis prior to Kimo Bancroft previously placed stent with intimal hyperplasia within the stented segment, 50% the stenosis, 95% stenosis in the second diagonal vessel and total occlusion of the apical LAD; left circumflex vessel with large OM1 vessel with 85% stenosis proximal to an aneurysmal segment, total occlusion of the OM 2 vessel with previously placed stent in this vessel and 90 and 70% bifurcation stenoses in the distal circumflex; severely diffusely diseased RCA with distal occlusion.  There is faint filling of Clotee Schlicker probable small PLA vessel via the left injection. Normal right heart pressures. LVEDP 16 mmHg. RECOMMENDATION: Plan initiate medical therapy since at present he is not on any anti-ischemic medications.  Most vessels are not amenable to intervention; however if patient experiences increasing chest pain symptomatology the large OM1 vessel can potentially undergo intervention with stenting.  Aggressive lipid-lowering therapy with target LDL less than 70.  Optimal blood pressure control.  Smoking cessation is essential.  With diffuse CAD consider DAPT therapy.   DG Chest Port 1 View  Result Date: 07/04/2019 CLINICAL DATA:  Shortness of breath EXAM: PORTABLE  CHEST 1 VIEW COMPARISON:  None. FINDINGS: There is cardiomegaly with pulmonary venous hypertension. There is diffuse interstitial thickening with suspected interstitial edema. There is atelectatic change in the medial left base with slight airspace opacity. No pleural effusions evident. No adenopathy. No bone lesions. IMPRESSION: Cardiomegaly with pulmonary vascular congestion. There is interstitial thickening, likely due to interstitial edema. Medial left base atelectasis with suspect developing pneumonia or alveolar edema. Overall the appearance is concerning for Jacqui Headen degree of congestive heart failure. There may well be superimposed pneumonia in the medial left base. Electronically Signed   By: Lowella Grip III M.D.   On: 07/04/2019 19:10   ECHOCARDIOGRAM COMPLETE  Result Date: 07/06/2019    ECHOCARDIOGRAM REPORT   Patient Name:   Larry Lewis Date of Exam: 07/06/2019 Medical Rec #:  [id number]   Height:       66.9 in Accession #:  [id number]  Weight:       177.6 lb Date of Birth:  Dec 21, 1965    BSA:          1.921 m Patient Age:    54 years    BP:           128/85 mmHg Patient Gender: M           HR:           97 bpm. Exam Location:  Inpatient Procedure: 2D Echo, Cardiac Doppler and Color Doppler Indications:    CHF-Acute Diastolic 428.31 I50.31  History:        Patient has no prior history of Echocardiogram examinations.                 Previous Myocardial Infarction and CAD.  Sonographer:    Roosvelt Maser RDCS Referring Phys: [id number] RONDELL Westley Blass SMITH IMPRESSIONS  1. Left ventricular ejection fraction, by estimation, is 35 to 40%. The left ventricle has moderately decreased function. The left ventricle demonstrates regional wall motion abnormalities (see scoring diagram/findings for description). Left ventricular  diastolic parameters are indeterminate.  2. Right ventricular systolic function is normal. The right ventricular size is normal.  3. Left atrial size was moderately dilated.  4. The mitral valve is  normal in structure. Mild mitral valve regurgitation. No evidence of mitral stenosis.  5. Indeterminate cusp structure. Given age and amount of calcification, high suspicion for bicuspid valve, either functional or congenital.. The aortic valve has an indeterminant number of cusps. Aortic valve regurgitation is not visualized. Mild to moderate aortic valve sclerosis/calcification is present, without any evidence of aortic stenosis.  6. The inferior vena cava is normal in size with greater than 50% respiratory variability, suggesting right atrial pressure of 3 mmHg. Comparison(s): No prior Echocardiogram. Conclusion(s)/Recommendation(s): Different focal wall motion abnormalities in multiple coronary territories. Nearly global abnormalities with preserved apex, but does not look like takostubo pattern. FINDINGS  Left Ventricle: Left ventricular ejection fraction, by estimation, is 35 to 40%. The left ventricle has moderately decreased function. The left ventricle demonstrates regional wall motion abnormalities. The left ventricular internal cavity size was normal in size. There is no left ventricular hypertrophy. Left ventricular diastolic parameters are indeterminate.  LV Wall Scoring: The anterior wall, antero-lateral wall, anterior septum, entire inferior wall, posterior wall, mid inferoseptal segment, and basal inferoseptal segment are hypokinetic. The apical lateral segment, apical septal segment, apical anterior segment, and apex are normal. Right Ventricle: The right ventricular size is normal. No increase in right ventricular wall thickness. Right ventricular systolic function is normal. Left Atrium: Left atrial size was moderately dilated. Right Atrium: Right atrial size was normal in size. Pericardium: There is no evidence of pericardial effusion. Mitral Valve: The mitral valve is normal in structure. Mild mitral valve regurgitation. No evidence of mitral valve stenosis. Tricuspid Valve: The tricuspid valve  is normal in structure. Tricuspid valve regurgitation is trivial. No evidence of tricuspid stenosis. Aortic Valve: Indeterminate cusp structure. Given age and amount of calcification, high suspicion for bicuspid valve, either functional or congenital. The aortic valve has an indeterminant number of cusps. Aortic valve regurgitation is not visualized. Mild to moderate aortic valve sclerosis/calcification is present, without any evidence of aortic stenosis. There is mild calcification of the aortic valve. Aortic valve mean gradient measures 10.0 mmHg. Aortic valve peak gradient measures 16.9 mmHg. Aortic valve area, by VTI measures 0.99 cm. Pulmonic Valve: The pulmonic valve was grossly normal. Pulmonic valve regurgitation is not visualized.  No evidence of pulmonic stenosis. Aorta: The aortic root and ascending aorta are structurally normal, with no evidence of dilitation. Pulmonary Artery: The pulmonary artery is not well seen. Venous: The inferior vena cava is normal in size with greater than 50% respiratory variability, suggesting right atrial pressure of 3 mmHg. IAS/Shunts: The atrial septum is grossly normal.  LEFT VENTRICLE PLAX 2D LVIDd:         4.60 cm      Diastology LVIDs:         3.90 cm      LV e' lateral:   9.57 cm/s LV PW:         1.10 cm      LV E/e' lateral: 8.3 LV IVS:        1.10 cm      LV e' medial:    7.51 cm/s LVOT diam:     2.20 cm      LV E/e' medial:  10.6 LV SV:         33 LV SV Index:   17 LVOT Area:     3.80 cm                              3D Volume EF: LV Volumes (MOD)            3D EF:        47 % LV vol d, MOD A2C: 121.0 ml LV EDV:       158 ml LV vol d, MOD A4C: 127.0 ml LV ESV:       84 ml LV vol s, MOD A2C: 78.2 ml  LV SV:        74 ml LV vol s, MOD A4C: 71.1 ml LV SV MOD A2C:     42.8 ml LV SV MOD A4C:     127.0 ml LV SV MOD BP:      50.5 ml RIGHT VENTRICLE            IVC RV Basal diam:  3.30 cm    IVC diam: 1.50 cm RV S prime:     9.79 cm/s TAPSE (M-mode): 2.2 cm LEFT ATRIUM            Index       RIGHT ATRIUM           Index LA diam:      4.40 cm 2.29 cm/m  RA Area:     15.10 cm LA Vol (A2C): 95.0 ml 49.45 ml/m RA Volume:   37.10 ml  19.31 ml/m  AORTIC VALVE AV Area (Vmax):    1.05 cm AV Area (Vmean):   1.02 cm AV Area (VTI):     0.99 cm AV Vmax:           205.50 cm/s AV Vmean:          151.000 cm/s AV VTI:            0.334 m AV Peak Grad:      16.9 mmHg AV Mean Grad:      10.0 mmHg LVOT Vmax:         56.80 cm/s LVOT Vmean:        40.700 cm/s LVOT VTI:          0.087 m LVOT/AV VTI ratio: 0.26  AORTA Ao Root diam: 3.70 cm Ao Asc diam:  3.40 cm MITRAL VALVE MV Area (PHT): 5.54 cm  SHUNTS MV Decel Time: 137 msec    Systemic VTI:  0.09 m MV E velocity: 79.30 cm/s  Systemic Diam: 2.20 cm MV Mylisa Brunson velocity: 48.00 cm/s MV E/Brannon Levene ratio:  1.65 Jodelle Red MD Electronically signed by Jodelle Red MD Signature Date/Time: 07/06/2019/3:50:01 PM    Final     Microbiology: Recent Results (from the past 240 hour(s))  Respiratory Panel by RT PCR (Flu Ulla Mckiernan&B, Covid) - Nasopharyngeal Swab     Status: None   Collection Time: 07/04/19  8:36 PM   Specimen: Nasopharyngeal Swab  Result Value Ref Range Status   SARS Coronavirus 2 by RT PCR NEGATIVE NEGATIVE Final    Comment: (NOTE) SARS-CoV-2 target nucleic acids are NOT DETECTED. The SARS-CoV-2 RNA is generally detectable in upper respiratoy specimens during the acute phase of infection. The lowest concentration of SARS-CoV-2 viral copies this assay can detect is 131 copies/mL. Presly Steinruck negative result does not preclude SARS-Cov-2 infection and should not be used as the sole basis for treatment or other patient management decisions. Azha Constantin negative result may occur with  improper specimen collection/handling, submission of specimen other than nasopharyngeal swab, presence of viral mutation(s) within the areas targeted by this assay, and inadequate number of viral copies (<131 copies/mL). Dusan Lipford negative result must be combined with  clinical observations, patient history, and epidemiological information. The expected result is Negative. Fact Sheet for Patients:  https://www.moore.com/ Fact Sheet for Healthcare Providers:  https://www.young.biz/ This test is not yet ap proved or cleared by the [place] FDA and  has been authorized for detection and/or diagnosis of SARS-CoV-2 by FDA under an Emergency Use Authorization (EUA). This EUA will remain  in effect (meaning this test can be used) for the duration of the COVID-19 declaration under Section 564(b)(1) of the Act, 21 U.S.C. section 360bbb-3(b)(1), unless the authorization is terminated or revoked sooner.    Influenza Janaki Exley by PCR NEGATIVE NEGATIVE Final   Influenza B by PCR NEGATIVE NEGATIVE Final    Comment: (NOTE) The Xpert Xpress SARS-CoV-2/FLU/RSV assay is intended as an aid in  the diagnosis of influenza from Nasopharyngeal swab specimens and  should not be used as Toretto Tingler sole basis for treatment. Nasal washings and  aspirates are unacceptable for Xpert Xpress SARS-CoV-2/FLU/RSV  testing. Fact Sheet for Patients: https://www.moore.com/ Fact Sheet for Healthcare Providers: https://www.young.biz/ This test is not yet approved or cleared by the [place] FDA and  has been authorized for detection and/or diagnosis of SARS-CoV-2 by  FDA under an Emergency Use Authorization (EUA). This EUA will remain  in effect (meaning this test can be used) for the duration of the  Covid-19 declaration under Section 564(b)(1) of the Act, 21  U.S.C. section 360bbb-3(b)(1), unless the authorization is  terminated or revoked. Performed at [facility], [address] [place], [place] [address]      Labs: Basic Metabolic Panel: Recent Labs  Lab 07/07/19 0336 07/07/19 0336 07/08/19 0350 07/08/19 0350 07/09/19 0435 07/09/19 1144 07/09/19 1201 07/10/19 0421 07/11/19 0522  NA  135   < > 136   < > 137 141  141 138 135 137  K 3.4*   < > 3.4*   < > 4.3 3.9  4.1 3.7 4.3 3.6  CL 99  --  94*  --  101  --   --  100 100  CO2 25  --  29  --  25  --   --  22 28  GLUCOSE 249*  --  239*  --  180*  --   --  266* 192*  BUN 14  --  18  --  18  --   --  19 13  CREATININE 0.82  --  0.89  --  0.81  --   --  0.82 0.84  CALCIUM 9.1  --  9.7  --  9.4  --   --  9.2 9.0  MG 2.0  --  2.0  --  2.0  --   --  1.9 2.0  PHOS 4.7*  --  4.8*  --  4.5  --   --  4.3 3.7   < > = values in this interval not displayed.   Liver Function Tests: Recent Labs  Lab 07/07/19 0336 07/08/19 0350 07/09/19 0435 07/10/19 0421 07/11/19 0522  AST  --   --   --   --  16  ALT  --   --   --   --  24  ALKPHOS  --   --   --   --  58  BILITOT  --   --   --   --  0.4  PROT  --   --   --   --  6.7  ALBUMIN 3.3* 3.4* 3.4* 3.2* 3.4*   No results for input(s): LIPASE, AMYLASE in the last 168 hours. No results for input(s): AMMONIA in the last 168 hours. CBC: Recent Labs  Lab 07/09/19 0435 07/09/19 1144 07/09/19 1201 07/10/19 0421 07/11/19 0522  WBC 9.2  --   --  9.3 9.4  NEUTROABS  --   --   --   --  4.8  HGB 15.9 17.0  17.0 16.3 14.8 15.7  HCT 50.5 50.0  50.0 48.0 47.0 50.7  MCV 76.6*  --   --  76.3* 76.8*  PLT 298  --   --  295 285   Cardiac Enzymes: No results for input(s): CKTOTAL, CKMB, CKMBINDEX, TROPONINI in the last 168 hours. BNP: BNP (last 3 results) Recent Labs    07/04/19 1750  BNP 267.6*    ProBNP (last 3 results) No results for input(s): PROBNP in the last 8760 hours.  CBG: Recent Labs  Lab 07/10/19 1613 07/10/19 2042 07/11/19 0707 07/11/19 1118 07/11/19 1257  GLUCAP 133* 235* 139* 130* 124*       Signed:  Lacretia Nicks MD.  [facility] 07/11/2019, 7:39 PM

## 2019-07-16 NOTE — Progress Notes (Signed)
Cardiology Office Note:    Date:  07/17/2019   ID:  Larry Lewis, DOB Dec 14, 1965, MRN [id number]  PCP:  Patient, No Pcp Per  Cardiologist:  Donato Heinz, MD  Electrophysiologist:  None   Referring MD: No ref. provider found   Chief Complaint  Patient presents with  . Coronary Artery Disease    History of Present Illness:    Larry Lewis is a 54 y.o. male with a hx of severe multivessel CAD, combined systolic and diastolic heart failure, diabetes, hypertension, hyperlipidemia, asthma, tobacco use who presents as a hospital follow-up.  He was admitted to [facility] on 07/05/2019 with acute combined systolic and diastolic heart failure.  He reported a history of CAD, had 2 stents placed in 2007 in [place].  He had not followed with a cardiologist since that time.  He presented with volume overload, and was treated with IV Lasix.  TTE on 07/06/2019 showed LVEF 35 to 40%, normal RV function, possible bicuspid aortic valve (no AI or AS).  He underwent LHC/RHC on 4/20, which showed severe diffuse multivessel CAD, medical management recommended.  Right heart pressures were normal.  In addition he was diagnosed with type 2 diabetes, A1c 12.2%.  Also found to have LDL 211.  Since discharge from the hospital, he reports that he has been doing well.  States that he has had a few episodes of chest pain that he describes as far left sided pain that lasts about 10 seconds and resolves.  Denies any shortness of breath.  Reports he has been weighing himself and weight has been stable.  He continues to smoke, smokes 2 to 3 cigarettes/day.   Wt Readings from Last 3 Encounters:  07/17/19 175 lb 6.4 oz (79.6 kg)  07/11/19 178 lb (80.7 kg)  10/04/17 188 lb 4.4 oz (85.4 kg)     Past Medical History:  Diagnosis Date  . AMI (acute myocardial infarction) ([place])   . Anxiety   . Asthma   . Coronary artery disease   . High cholesterol   . Hypertension     Past Surgical History:  Procedure  Laterality Date  . CARDIAC CATHETERIZATION    . RIGHT/LEFT HEART CATH AND CORONARY ANGIOGRAPHY N/A 07/09/2019   Procedure: RIGHT/LEFT HEART CATH AND CORONARY ANGIOGRAPHY;  Surgeon: Larry Sine, MD;  Location: [facility];  Service: Cardiovascular;  Laterality: N/A;    Current Medications: Current Meds  Medication Sig  . aspirin EC 81 MG EC tablet Take 1 tablet (81 mg total) by mouth daily.  Marland Kitchen atorvastatin (LIPITOR) 80 MG tablet Take 1 tablet (80 mg total) by mouth daily.  . clopidogrel (PLAVIX) 75 MG tablet Take 1 tablet (75 mg total) by mouth daily.  . furosemide (LASIX) 20 MG tablet Take 1 tablet (20 mg total) by mouth daily.  . insulin aspart (NOVOLOG) 100 UNIT/ML FlexPen Inject 8 Units into the skin 3 (three) times daily with meals.  . insulin glargine (LANTUS) 100 UNIT/ML Solostar Pen Inject 25 Units into the skin 2 (two) times daily.  . metFORMIN (GLUCOPHAGE) 500 MG tablet Take 1 tablet (500 mg total) by mouth daily with breakfast.  . metoprolol succinate (TOPROL-XL) 50 MG 24 hr tablet Take 1 tablet (50 mg total) by mouth daily.  . nicotine (NICODERM CQ - DOSED IN MG/24 HOURS) 21 mg/24hr patch Place 1 patch (21 mg total) onto the skin daily.  . nitroGLYCERIN (NITROSTAT) 0.4 MG SL tablet Place 1 tablet (0.4 mg total) under the tongue  every 5 (five) minutes x 3 doses as needed for chest pain.  . sacubitril-valsartan (ENTRESTO) 24-26 MG Take 1 tablet by mouth 2 (two) times daily.  Marland Kitchen spironolactone (ALDACTONE) 25 MG tablet Take 0.5 tablets (12.5 mg total) by mouth daily.  . [DISCONTINUED] metoprolol succinate (TOPROL-XL) 25 MG 24 hr tablet Take 1 tablet (25 mg total) by mouth daily.     Allergies:   Penicillins   Social History   Socioeconomic History  . Marital status: Single    Spouse name: Not on file  . Number of children: Not on file  . Years of education: Not on file  . Highest education level: Not on file  Occupational History  . Not on file  Tobacco Use  .  Smoking status: Current Every Day Smoker    Packs/day: 1.00    Types: Cigarettes  . Smokeless tobacco: Never Used  Substance and Sexual Activity  . Alcohol use: Never  . Drug use: Never  . Sexual activity: Not on file  Other Topics Concern  . Not on file  Social History Narrative  . Not on file   Social Determinants of Health   Financial Resource Strain:   . Difficulty of Paying Living Expenses:   Food Insecurity:   . Worried About Programme researcher, broadcasting/film/video in the Last Year:   . Barista in the Last Year:   Transportation Needs:   . Freight forwarder (Medical):   Marland Kitchen Lack of Transportation (Non-Medical):   Physical Activity:   . Days of Exercise per Week:   . Minutes of Exercise per Session:   Stress:   . Feeling of Stress :   Social Connections:   . Frequency of Communication with Friends and Family:   . Frequency of Social Gatherings with Friends and Family:   . Attends Religious Services:   . Active Member of Clubs or Organizations:   . Attends Banker Meetings:   Marland Kitchen Marital Status:      Family History: The patient's family history is not on file.  ROS:   Please see the history of present illness.     All other systems reviewed and are negative.  EKGs/Labs/Other Studies Reviewed:    The following studies were reviewed today:   EKG:  EKG is  ordered today.  The ekg ordered today demonstrates sinus tachycardia, rate 107, QTc 480 T wave inversions inferior leads  TTE 07/06/19: 1. Left ventricular ejection fraction, by estimation, is 35 to 40%. The  left ventricle has moderately decreased function. The left ventricle  demonstrates regional wall motion abnormalities (see scoring  diagram/findings for description). Left ventricular  diastolic parameters are indeterminate.  2. Right ventricular systolic function is normal. The right ventricular  size is normal.  3. Left atrial size was moderately dilated.  4. The mitral valve is normal in  structure. Mild mitral valve  regurgitation. No evidence of mitral stenosis.  5. Indeterminate cusp structure. Given age and amount of calcification,  high suspicion for bicuspid valve, either functional or congenital.. The  aortic valve has an indeterminant number of cusps. Aortic valve  regurgitation is not visualized. Mild to  moderate aortic valve sclerosis/calcification is present, without any  evidence of aortic stenosis.  6. The inferior vena cava is normal in size with greater than 50%  respiratory variability, suggesting right atrial pressure of 3 mmHg.   RHC/LHC 07/09/19:  Mid RCA to Dist RCA lesion is 100% stenosed.  Acute Mrg lesion is  90% stenosed.  Ost RCA to Prox RCA lesion is 70% stenosed.  Prox RCA to Mid RCA lesion is 90% stenosed.  1st Mrg lesion is 85% stenosed.  2nd Mrg-1 lesion is 100% stenosed.  2nd Mrg-2 lesion is 100% stenosed.  3rd Mrg lesion is 90% stenosed.  Dist Cx lesion is 70% stenosed.  Dist LAD lesion is 100% stenosed.  2nd Diag lesion is 95% stenosed.  Mid LAD lesion is 50% stenosed.  1st Diag-2 lesion is 40% stenosed.  1st Diag-1 lesion is 80% stenosed.  Mid Cx to Dist Cx lesion is 30% stenosed.   Severe diffuse multivessel CAD with moderate irregularity of the LAD with 80% proximal diagonal stenosis prior to a previously placed stent with intimal hyperplasia within the stented segment, 50% the stenosis, 95% stenosis in the second diagonal vessel and total occlusion of the apical LAD; left circumflex vessel with large OM1 vessel with 85% stenosis proximal to an aneurysmal segment, total occlusion of the OM 2 vessel with previously placed stent in this vessel and 90 and 70% bifurcation stenoses in the distal circumflex; severely diffusely diseased RCA with distal occlusion.  There is faint filling of a probable small PLA vessel via the left injection.  Normal right heart pressures.  LVEDP 16 mmHg.  RECOMMENDATION: Plan initiate  medical therapy since at present he is not on any anti-ischemic medications.  Most vessels are not amenable to intervention; however if patient experiences increasing chest pain symptomatology the large OM1 vessel can potentially undergo intervention with stenting.  Aggressive lipid-lowering therapy with target LDL less than 70.  Optimal blood pressure control.  Smoking cessation is essential.  With diffuse CAD consider DAPT therapy.  Recent Labs: 07/04/2019: B Natriuretic Peptide 267.6 07/06/2019: TSH 0.527 07/11/2019: ALT 24; BUN 13; Creatinine, Ser 0.84; Hemoglobin 15.7; Magnesium 2.0; Platelets 285; Potassium 3.6; Sodium 137  Recent Lipid Panel    Component Value Date/Time   CHOL 274 (H) 07/07/2019 0336   TRIG 145 07/07/2019 0336   HDL 34 (L) 07/07/2019 0336   CHOLHDL 8.1 07/07/2019 0336   VLDL 29 07/07/2019 0336   LDLCALC 211 (H) 07/07/2019 0336    Physical Exam:    VS:  BP 130/74   Pulse (!) 107   Temp (!) 97.1 F (36.2 C)   Ht 5\' 4"  (1.626 m)   Wt 175 lb 6.4 oz (79.6 kg)   SpO2 97%   BMI 30.11 kg/m     Wt Readings from Last 3 Encounters:  07/17/19 175 lb 6.4 oz (79.6 kg)  07/11/19 178 lb (80.7 kg)  10/04/17 188 lb 4.4 oz (85.4 kg)     GEN: in no acute distress HEENT: Normal NECK: No JVD CARDIAC:RRR, no murmurs, rubs, gallops RESPIRATORY:  Clear to auscultation without rales, wheezing or rhonchi  ABDOMEN: Soft, non-tender, non-distended MUSCULOSKELETAL:  No edema SKIN: Warm and dry NEUROLOGIC:  Alert and oriented x 3 PSYCHIATRIC:  Normal affect   ASSESSMENT:    1. Coronary artery disease involving native coronary artery of native heart with angina pectoris (HCC)   2. Acute combined systolic and diastolic heart failure (HCC)   3. Medication management   4. Hyperlipidemia, unspecified hyperlipidemia type   5. Tobacco use    PLAN:     CAD: Severe multivessel CAD not amenable to intervention on catheterization 07/09/2019. -Continue aspirin 81 mg daily and  Plavix 75 mg daily -Increase Toprol-XL to 50 mg daily -Sublingual nitroglycerin as needed  Acute combined systolic and diastolic heart failure: EF  35 to 40% on TTE 07/06/2018.  Diuresed with IV Lasix during recent admission, RHC on 07/09/2019 showed normal right heart pressures -Continue Entresto 24-26 mg twice daily -Increase Toprol-XL to 50 mg daily -Continue spironolactone 12.5 mg daily -Appears euvolemic, continue Lasix 20 mg daily.  Will check BMP/magnesium -Will schedule follow-up in pharmacy clinic in 2 weeks to titrate heart failure medications  Hyperlipidemia: LDL 211.  Started on atorvastatin 80 mg daily  Type 2 diabetes: A1c 12.2.  Started on insulin during recent admission.  Tobacco use: Patient counseled on risks of tobacco use and cessation strongly encouraged.   RTC in 2 months   Medication Adjustments/Labs and Tests Ordered: Current medicines are reviewed at length with the patient today.  Concerns regarding medicines are outlined above.  Orders Placed This Encounter  Procedures  . Basic metabolic panel  . Magnesium  . EKG 12-Lead   Meds ordered this encounter  Medications  . metoprolol succinate (TOPROL-XL) 50 MG 24 hr tablet    Sig: Take 1 tablet (50 mg total) by mouth daily.    Dispense:  30 tablet    Refill:  1    Dose increase    Patient Instructions  Medication Instructions:  INCREASE metoprolol succinate (Toprol XL) to 50 mg daily  *If you need a refill on your cardiac medications before your next appointment, please call your pharmacy*   Lab Work: BMET, Mag today  If you have labs (blood work) drawn today and your tests are completely normal, you will receive your results only by: Marland Kitchen MyChart Message (if you have MyChart) OR . A paper copy in the mail If you have any lab test that is abnormal or we need to change your treatment, we will call you to review the results.  Follow-Up: At [facility], you and your health needs are our priority.   As part of our continuing mission to provide you with exceptional heart care, we have created designated Provider Care Teams.  These Care Teams include your primary Cardiologist (physician) and Advanced Practice Providers (APPs -  Physician Assistants and Nurse Practitioners) who all work together to provide you with the care you need, when you need it.  We recommend signing up for the patient portal called "MyChart".  Sign up information is provided on this After Visit Summary.  MyChart is used to connect with patients for Virtual Visits (Telemedicine).  Patients are able to view lab/test results, encounter notes, upcoming appointments, etc.  Non-urgent messages can be sent to your provider as well.   To learn more about what you can do with MyChart, go to [email or web address].    Your next appointment:    2 weeks with pharmacist (med titration) 2 months with Dr. Bjorn Pippin         Signed, Little Ishikawa, MD  07/17/2019 9:33 PM    [facility]

## 2019-07-17 ENCOUNTER — Ambulatory Visit (INDEPENDENT_AMBULATORY_CARE_PROVIDER_SITE_OTHER): Payer: Self-pay | Admitting: Cardiology

## 2019-07-17 ENCOUNTER — Other Ambulatory Visit: Payer: Self-pay

## 2019-07-17 ENCOUNTER — Encounter: Payer: Self-pay | Admitting: Cardiology

## 2019-07-17 VITALS — BP 130/74 | HR 107 | Temp 97.1°F | Ht 64.0 in | Wt 175.4 lb

## 2019-07-17 DIAGNOSIS — I5041 Acute combined systolic (congestive) and diastolic (congestive) heart failure: Secondary | ICD-10-CM

## 2019-07-17 DIAGNOSIS — Z720 Tobacco use: Secondary | ICD-10-CM

## 2019-07-17 DIAGNOSIS — I25119 Atherosclerotic heart disease of native coronary artery with unspecified angina pectoris: Secondary | ICD-10-CM

## 2019-07-17 DIAGNOSIS — Z79899 Other long term (current) drug therapy: Secondary | ICD-10-CM

## 2019-07-17 DIAGNOSIS — E785 Hyperlipidemia, unspecified: Secondary | ICD-10-CM

## 2019-07-17 MED ORDER — METOPROLOL SUCCINATE ER 50 MG PO TB24: 50.0000 mg | ORAL_TABLET | Freq: Every day | ORAL | 1 refills | Status: DC

## 2019-07-17 NOTE — Patient Instructions (Addendum)
Medication Instructions:  INCREASE metoprolol succinate (Toprol XL) to 50 mg daily  *If you need a refill on your cardiac medications before your next appointment, please call your pharmacy*   Lab Work: BMET, Mag today  If you have labs (blood work) drawn today and your tests are completely normal, you will receive your results only by: Marland Kitchen MyChart Message (if you have MyChart) OR . A paper copy in the mail If you have any lab test that is abnormal or we need to change your treatment, we will call you to review the results.  Follow-Up: At [facility], you and your health needs are our priority.  As part of our continuing mission to provide you with exceptional heart care, we have created designated Provider Care Teams.  These Care Teams include your primary Cardiologist (physician) and Advanced Practice Providers (APPs -  Physician Assistants and Nurse Practitioners) who all work together to provide you with the care you need, when you need it.  We recommend signing up for the patient portal called "MyChart".  Sign up information is provided on this After Visit Summary.  MyChart is used to connect with patients for Virtual Visits (Telemedicine).  Patients are able to view lab/test results, encounter notes, upcoming appointments, etc.  Non-urgent messages can be sent to your provider as well.   To learn more about what you can do with MyChart, go to [email or web address].    Your next appointment:    2 weeks with pharmacist (med titration) 2 months with Dr. Bjorn Pippin

## 2019-07-18 ENCOUNTER — Telehealth: Payer: Self-pay | Admitting: Licensed Clinical Social Worker

## 2019-07-18 LAB — BASIC METABOLIC PANEL
BUN/Creatinine Ratio: 20 (ref 9–20)
BUN: 17 mg/dL (ref 6–24)
CO2: 22 mmol/L (ref 20–29)
Calcium: 10 mg/dL (ref 8.7–10.2)
Chloride: 99 mmol/L (ref 96–106)
Creatinine, Ser: 0.87 mg/dL (ref 0.76–1.27)
GFR calc Af Amer: 112 mL/min/{1.73_m2} (ref >59)
GFR calc non Af Amer: 97 mL/min/{1.73_m2} (ref >59)
Glucose: 179 mg/dL — ABNORMAL HIGH (ref 65–99)
Potassium: 4.6 mmol/L (ref 3.5–5.2)
Sodium: 137 mmol/L (ref 134–144)

## 2019-07-18 LAB — MAGNESIUM: Magnesium: 2 mg/dL (ref 1.6–2.3)

## 2019-07-18 NOTE — Telephone Encounter (Signed)
CSW referred to assist with medications, insurance and disability application. CSW contacted patient and spoke with his cousin who handles most of his care needs. [name] states that patient completed both a medicaid application as well as started the disability process during his recent hospitalization. He also noted that patient has a follow up appointment at [facility] next week. CSW explained that hospital follow up appointment is important for follow up. CSW advised to ask about an appointment with financial counseling after establishing care at [facility] to apply for the [employer] Card for pharmacy needs at [facility]. Patient's cousin verbalizes understanding and will follow up. CSW left contact information for further assistance if needed. Lasandra Beech, LCSW, CCSW-MCS [phone]

## 2019-07-30 ENCOUNTER — Other Ambulatory Visit: Payer: Self-pay

## 2019-07-30 ENCOUNTER — Ambulatory Visit (INDEPENDENT_AMBULATORY_CARE_PROVIDER_SITE_OTHER): Payer: Self-pay | Admitting: Pharmacist

## 2019-07-30 VITALS — BP 116/62 | HR 100 | Temp 98.4°F | Ht 69.0 in | Wt 186.2 lb

## 2019-07-30 DIAGNOSIS — I5043 Acute on chronic combined systolic (congestive) and diastolic (congestive) heart failure: Secondary | ICD-10-CM

## 2019-07-30 MED ORDER — INSULIN GLARGINE 100 UNIT/ML SOLOSTAR PEN: 25.0000 [IU] | PEN_INJECTOR | Freq: Two times a day (BID) | SUBCUTANEOUS | 3 refills | Status: DC

## 2019-07-30 MED ORDER — ENTRESTO 24-26 MG PO TABS: 1.0000 | ORAL_TABLET | Freq: Two times a day (BID) | ORAL | 11 refills | Status: DC

## 2019-07-30 MED ORDER — SPIRONOLACTONE 25 MG PO TABS: 25.0000 mg | ORAL_TABLET | Freq: Every day | ORAL | 1 refills | Status: DC

## 2019-07-30 MED ORDER — METOPROLOL SUCCINATE ER 100 MG PO TB24: 100.0000 mg | ORAL_TABLET | Freq: Every day | ORAL | 1 refills | Status: DC

## 2019-07-30 MED ORDER — INSULIN ASPART 100 UNIT/ML FLEXPEN: 8.0000 [IU] | PEN_INJECTOR | Freq: Three times a day (TID) | SUBCUTANEOUS | 3 refills | Status: DC

## 2019-07-30 MED ORDER — NICOTINE 21 MG/24HR TD PT24: 21.0000 mg | MEDICATED_PATCH | Freq: Every day | TRANSDERMAL | 0 refills | Status: DC

## 2019-07-30 NOTE — Patient Instructions (Addendum)
Return for a a follow up appointment in 4 weeks   Blood work: 1 week before next appointment  Check your blood pressure at home daily (if able) and keep record of the readings.  Take your BP meds as follows: *INCREASE metoprolol succinate to 100mg  daily* *CONTINUE all other medication as previously prescribed*   Bring all of your meds, your BP cuff and your record of home blood pressures to your next appointment.  Exercise as you're able, try to walk approximately 30 minutes per day.  Keep salt intake to a minimum, especially watch canned and prepared boxed foods.  Eat more fresh fruits and vegetables and fewer canned items.  Avoid eating in fast food restaurants.    HOW TO TAKE YOUR BLOOD PRESSURE: . Rest 5 minutes before taking your blood pressure. .  Don't smoke or drink caffeinated beverages for at least 30 minutes before. . Take your blood pressure before (not after) you eat. . Sit comfortably with your back supported and both feet on the floor (don't cross your legs). . Elevate your arm to heart level on a table or a desk. . Use the proper sized cuff. It should fit smoothly and snugly around your bare upper arm. There should be enough room to slip a fingertip under the cuff. The bottom edge of the cuff should be 1 inch above the crease of the elbow. . Ideally, take 3 measurements at one sitting and record the average.

## 2019-07-30 NOTE — Progress Notes (Signed)
Patient ID: Larry Lewis                 DOB: 11/10/1965                      MRN: [id number]     HPI: Larry Lewis is a 54 y.o. male referred by Dr.Schumann to pharmacist clinic for medication titration. PMH is significant for CAD s/p stent placement, HFrEF (LEVEF 35-40% on 07/06/2019), DM, HTN, hyperlipidemia, asthma, and tobacco abuse. During last OV with Dr Bjorn Pippin metoprolol dose was increased from 25mg  to 50mg  daily. Patient also taking furosemide, Entresto 24-26mg  twice daily, and spironolactone 12.5mg  daily.  He presents to clinic today and denies problems with current therapy. Reports some feeling of fullness but is not taking furosemide every day as prescribed.  Mr Cruzan still working with disability application and will benefit from assistance affording medication at the pharmacy. He is also diabetic on insulin therapy without medical insurance.   *visit conducted by patient, best friend and interpreter*  Current HTN meds:  Entresto 24-26mg  BID Furosemide 20mg  daily - not taking every day Spironolactone 12.5mg  daily Metoprolol Succ 50mg  daily (increased 07/17/2019 by MD)  BP goal: <130/80  Family History: uncontrolled diabetes in father  Social History: current smoker, denies alcohol intake  Home BP readings: none provided  Wt Readings from Last 3 Encounters:  08/01/19 187 lb 3.2 oz (84.9 kg)  07/30/19 186 lb 3.2 oz (84.5 kg)  07/17/19 175 lb 6.4 oz (79.6 kg)   BP Readings from Last 3 Encounters:  08/01/19 110/73  07/30/19 116/62  07/17/19 130/74   Pulse Readings from Last 3 Encounters:  08/01/19 (!) 107  07/30/19 100  07/17/19 (!) 107    Renal function: Estimated Creatinine Clearance: 95.9 mL/min (by C-G formula based on SCr of 0.87 mg/dL).  Past Medical History:  Diagnosis Date  . AMI (acute myocardial infarction) (HCC)   . Anxiety   . Asthma   . Coronary artery disease   . High cholesterol   . Hypertension     Current Outpatient Medications on File Prior  to Visit  Medication Sig Dispense Refill  . aspirin EC 81 MG EC tablet Take 1 tablet (81 mg total) by mouth daily. 30 tablet 1  . atorvastatin (LIPITOR) 80 MG tablet Take 1 tablet (80 mg total) by mouth daily. 30 tablet 1  . clopidogrel (PLAVIX) 75 MG tablet Take 1 tablet (75 mg total) by mouth daily. 30 tablet 1  . furosemide (LASIX) 20 MG tablet Take 1 tablet (20 mg total) by mouth daily. 30 tablet 1  . nitroGLYCERIN (NITROSTAT) 0.4 MG SL tablet Place 1 tablet (0.4 mg total) under the tongue every 5 (five) minutes x 3 doses as needed for chest pain. 30 tablet 0   No current facility-administered medications on file prior to visit.    Allergies  Allergen Reactions  . Penicillins Itching    Blood pressure 116/62, pulse 100, temperature 98.4 F (36.9 C), height 5\' 9"  (1.753 m), weight 186 lb 3.2 oz (84.5 kg).  Acute exacerbation of CHF (congestive heart failure) (HCC) Blood pressure well controlled with HR in the 100's. Patient is tolerating therapy, but reports issues affording medication and no current medical insurance.   Will increase metoprolol dose to 100mg  daily, and continue all other medication as prescribed. Plan to follow up in 4 weeks and further titrate entresto if possible. Communication was sent to social worker 07/19/19 to assist on disability and  financial assistance paperwork.   GoodRx , 30 day free card for [place] ,and [facility] gift card was also provided during OV.   Khushi Zupko Rodriguez-Guzman PharmD, BCPS, [place] [id number] 08/05/2019 4:04 PM

## 2019-07-31 ENCOUNTER — Telehealth: Payer: Self-pay | Admitting: Licensed Clinical Social Worker

## 2019-07-31 NOTE — Telephone Encounter (Signed)
CSW received referral to follow up with patient regarding insurance and disability discussion from a few weeks back. CSW spoke with [name] (friend) who is handling patients needs who reports he left a message for Aileen Fass at [employer] who is completing the medicaid application and awaiting return call.  Patient has appointment tomorrow at [facility] for PCP and was instructed to ask about financial counseling appointment and applying for the blue card. Friend states he had all of patient's medications transferred from [facility] to [facility] pharmacy where he was told he could get some assistance with obtaining his medications.  CSW confirmed that the [employer] works at the [employer]. Mohamed verbalizes understanding of follow up and will return call to CSW as needed. Lasandra Beech, LCSW, CCSW-MCS [phone]

## 2019-08-01 ENCOUNTER — Ambulatory Visit (INDEPENDENT_AMBULATORY_CARE_PROVIDER_SITE_OTHER): Payer: Self-pay | Admitting: Primary Care

## 2019-08-01 ENCOUNTER — Other Ambulatory Visit: Payer: Self-pay

## 2019-08-01 ENCOUNTER — Other Ambulatory Visit (INDEPENDENT_AMBULATORY_CARE_PROVIDER_SITE_OTHER): Payer: Self-pay | Admitting: Primary Care

## 2019-08-01 ENCOUNTER — Encounter (INDEPENDENT_AMBULATORY_CARE_PROVIDER_SITE_OTHER): Payer: Self-pay | Admitting: Primary Care

## 2019-08-01 VITALS — BP 110/73 | HR 107 | Temp 97.7°F | Ht 69.0 in | Wt 187.2 lb

## 2019-08-01 DIAGNOSIS — F411 Generalized anxiety disorder: Secondary | ICD-10-CM

## 2019-08-01 DIAGNOSIS — E1165 Type 2 diabetes mellitus with hyperglycemia: Secondary | ICD-10-CM

## 2019-08-01 DIAGNOSIS — I25119 Atherosclerotic heart disease of native coronary artery with unspecified angina pectoris: Secondary | ICD-10-CM

## 2019-08-01 DIAGNOSIS — Z720 Tobacco use: Secondary | ICD-10-CM

## 2019-08-01 DIAGNOSIS — E876 Hypokalemia: Secondary | ICD-10-CM

## 2019-08-01 DIAGNOSIS — Z7689 Persons encountering health services in other specified circumstances: Secondary | ICD-10-CM

## 2019-08-01 MED ORDER — FLUOXETINE HCL 20 MG PO TABS: 20.0000 mg | ORAL_TABLET | Freq: Every day | ORAL | 3 refills | Status: DC

## 2019-08-01 MED ORDER — POTASSIUM CHLORIDE CRYS ER 20 MEQ PO TBCR: 40.0000 meq | EXTENDED_RELEASE_TABLET | Freq: Every day | ORAL | 0 refills | Status: DC

## 2019-08-01 MED ORDER — POTASSIUM CHLORIDE ER 20 MEQ PO TBCR: 40.0000 meq | EXTENDED_RELEASE_TABLET | Freq: Every day | ORAL | 0 refills | Status: DC

## 2019-08-01 MED ORDER — METFORMIN HCL 1000 MG PO TABS: 1000.0000 mg | ORAL_TABLET | Freq: Two times a day (BID) | ORAL | 3 refills | Status: DC

## 2019-08-01 NOTE — Patient Instructions (Signed)

## 2019-08-01 NOTE — Progress Notes (Signed)
New Patient Office Visit  Subjective:  Patient ID: Larry Lewis, male    DOB: September 29, 1965  Age: 54 y.o. MRN: [id number]  CC: No chief complaint on file.   HPI Larry Lewis presents for is a 53 year old Arabic male Speaks English well. In today today to establish primary care. He voices only 1 problem and that is wanting to stop smoking. He is very sweet A1C  12.2 Past Medical History:  Diagnosis Date  . AMI (acute myocardial infarction) ([place])   . Anxiety   . Asthma   . Coronary artery disease   . High cholesterol   . Hypertension     Past Surgical History:  Procedure Laterality Date  . CARDIAC CATHETERIZATION    . RIGHT/LEFT HEART CATH AND CORONARY ANGIOGRAPHY N/A 07/09/2019   Procedure: RIGHT/LEFT HEART CATH AND CORONARY ANGIOGRAPHY;  Surgeon: Troy Sine, MD;  Location: [facility];  Service: Cardiovascular;  Laterality: N/A;    History reviewed. No pertinent family history.  Social History   Socioeconomic History  . Marital status: Single    Spouse name: Not on file  . Number of children: Not on file  . Years of education: Not on file  . Highest education level: Not on file  Occupational History  . Not on file  Tobacco Use  . Smoking status: Current Every Day Smoker    Packs/day: 1.00    Types: Cigarettes  . Smokeless tobacco: Never Used  Substance and Sexual Activity  . Alcohol use: Never  . Drug use: Never  . Sexual activity: Not on file  Other Topics Concern  . Not on file  Social History Narrative  . Not on file   Social Determinants of Health   Financial Resource Strain:   . Difficulty of Paying Living Expenses:   Food Insecurity:   . Worried About Charity fundraiser in the Last Year:   . Arboriculturist in the Last Year:   Transportation Needs:   . Film/video editor (Medical):   Marland Kitchen Lack of Transportation (Non-Medical):   Physical Activity:   . Days of Exercise per Week:   . Minutes of Exercise per Session:   Stress:   . Feeling  of Stress :   Social Connections:   . Frequency of Communication with Friends and Family:   . Frequency of Social Gatherings with Friends and Family:   . Attends Religious Services:   . Active Member of Clubs or Organizations:   . Attends Archivist Meetings:   Marland Kitchen Marital Status:   Intimate Partner Violence:   . Fear of Current or Ex-Partner:   . Emotionally Abused:   Marland Kitchen Physically Abused:   . Sexually Abused:     ROS Review of Systems  Endocrine: Positive for polydipsia, polyphagia and polyuria.  Neurological: Positive for headaches.  Psychiatric/Behavioral: Positive for agitation. The patient is nervous/anxious.   All other systems reviewed and are negative.   Objective:   Today's Vitals: BP 110/73   Pulse (!) 107   Temp 97.7 F (36.5 C)   Ht 5\' 9"  (1.753 m)   Wt 187 lb 3.2 oz (84.9 kg)   SpO2 96%   BMI 27.64 kg/m   Physical Exam Vitals reviewed.  Constitutional:      Appearance: Normal appearance.  HENT:     Head: Normocephalic.     Right Ear: Tympanic membrane normal.     Left Ear: Tympanic membrane normal.  Eyes:  Extraocular Movements: Extraocular movements intact.     Pupils: Pupils are equal, round, and reactive to light.  Cardiovascular:     Rate and Rhythm: Normal rate and regular rhythm.  Pulmonary:     Effort: Pulmonary effort is normal.     Breath sounds: Normal breath sounds.  Abdominal:     General: Bowel sounds are normal.  Musculoskeletal:     Cervical back: Normal range of motion and neck supple.  Skin:    General: Skin is warm and dry.  Neurological:     General: No focal deficit present.     Mental Status: He is alert and oriented to person, place, and time.  Psychiatric:        Mood and Affect: Mood normal.        Behavior: Behavior normal.        Thought Content: Thought content normal.        Judgment: Judgment normal.     Assessment & Plan:  Diagnoses and all orders for this visit:  Tobacco abuse He is aware  ofncreased risk for lung cancer and other respiratory diseases recommend cessation.  This will be reminded at each clinical visit.  Encounter to establish care Gwinda Passe, NP-C will be your  (PCP) she is mastered prepared . She is skilled to diagnosed and treat illness. Also able to answer health concern as well as continuing care of varied medical conditions, not limited by cause, organ system, or diagnosis.   Generalized anxiety disorder  We discussed options for treatment of anxiety including therapy and/or medication. Depression screen PHQ 2/9 08/01/2019  Decreased Interest 2  Down, Depressed, Hopeless 3  PHQ - 2 Score 5  Altered sleeping 3  Tired, decreased energy 3  Change in appetite 3  Feeling bad or failure about yourself  3  Trouble concentrating 2  Moving slowly or fidgety/restless 3  Suicidal thoughts 0  PHQ-9 Score 22  Difficult doing work/chores Extremely dIfficult  Start Prozac 20mg  at bedtime and follow up with CSW  Uncontrolled type 2 diabetes mellitus with hyperglycemia (HCC) : Goal of therapy: Less than 6.5 hemoglobin A1c.Foods that are high in carbohydrates are the following rice, potatoes, breads, sugars, and pastas.  Reduction in the intake (eating) will assist in lowering your blood sugars. Continue Lantus 25 units twice a day and increased metformin to 1000 mg twice a day and continue to use novolog 8 units after meals  Coronary artery disease involving native coronary artery of native heart with angina pectoris (HCC) Followed by cardiology     Outpatient Encounter Medications as of 08/01/2019  Medication Sig  . aspirin EC 81 MG EC tablet Take 1 tablet (81 mg total) by mouth daily.  08/03/2019 atorvastatin (LIPITOR) 80 MG tablet Take 1 tablet (80 mg total) by mouth daily.  . clopidogrel (PLAVIX) 75 MG tablet Take 1 tablet (75 mg total) by mouth daily.  . furosemide (LASIX) 20 MG tablet Take 1 tablet (20 mg total) by mouth daily.  . insulin aspart (NOVOLOG) 100  UNIT/ML FlexPen Inject 8 Units into the skin 3 (three) times daily with meals.  . insulin glargine (LANTUS) 100 UNIT/ML Solostar Pen Inject 25 Units into the skin 2 (two) times daily.  . metoprolol succinate (TOPROL-XL) 100 MG 24 hr tablet Take 1 tablet (100 mg total) by mouth daily.  . nicotine (NICODERM CQ - DOSED IN MG/24 HOURS) 21 mg/24hr patch Place 1 patch (21 mg total) onto the skin daily.  . nitroGLYCERIN (NITROSTAT)  0.4 MG SL tablet Place 1 tablet (0.4 mg total) under the tongue every 5 (five) minutes x 3 doses as needed for chest pain.  . sacubitril-valsartan (ENTRESTO) 24-26 MG Take 1 tablet by mouth 2 (two) times daily.  Marland Kitchen spironolactone (ALDACTONE) 25 MG tablet Take 1 tablet (25 mg total) by mouth daily.  . [DISCONTINUED] metFORMIN (GLUCOPHAGE) 500 MG tablet Take 1 tablet (500 mg total) by mouth daily with breakfast.   No facility-administered encounter medications on file as of 08/01/2019.    Follow-up: Return in about 3 months (around 11/01/2019) for DM in person. 3 weeks in person CSW.   Grayce Sessions, NP

## 2019-08-01 NOTE — Progress Notes (Signed)
187.2  Larry Lewis [id number]  HFU   cbg 253 today Med refills for   Social worker referral

## 2019-08-05 NOTE — Assessment & Plan Note (Addendum)
Blood pressure well controlled with HR in the 100's. Patient is tolerating therapy, but reports issues affording medication and no current medical insurance.   Will increase metoprolol dose to 100mg  daily, and continue all other medication as prescribed. Plan to follow up in 4 weeks and further titrate entresto if possible. Communication was sent to social worker to assist on disability and financial assistance paperwork.   GoodRx , 30 day free card for [place] ,and [place] gift card was also provided during OV.

## 2019-08-20 ENCOUNTER — Institutional Professional Consult (permissible substitution) (INDEPENDENT_AMBULATORY_CARE_PROVIDER_SITE_OTHER): Payer: Self-pay | Admitting: Licensed Clinical Social Worker

## 2019-08-27 ENCOUNTER — Ambulatory Visit: Payer: Self-pay

## 2019-08-27 NOTE — Progress Notes (Deleted)
Patient ID: Larry Lewis                 DOB: January 11, 1966                      MRN: [id number]     HPI: Larry Lewis is a 54 y.o. male referred by Dr.Schumann to pharmacist clinic for medication titration. PMH is significant for CAD s/p stent placement, HFrEF (LEVEF 35-40% on 07/06/2019), DM, HTN, hyperlipidemia, asthma, and tobacco abuse.  Originally seen by Dr. Bjorn Pippin, his metoprolol has been slowly titrated up from 25 to 100 mg daily.  Patient also taking furosemide, Entresto 24-26mg  twice daily, and spironolactone 12.5mg  daily.  At his last visit with Raquel Rodriguez-Guzman he was given information on patient assistance for the [facility], as well as a card for free 30 day supply.  He was also put in touch with Lasandra Beech, social worker, to help with getting assistance.  At that time he was planning a trip to [place] to see his father, who was in poor health.    Today he returns for follow up    *visit conducted by patient, best friend and interpreter*  Current HTN meds:  Entresto 24-26mg  BID Furosemide 20mg  daily - not taking every day Spironolactone 12.5mg  daily Metoprolol Succ 100mg  daily (increased 5/11//2021 by PharmD)  BP goal: <130/80  Family History: uncontrolled diabetes in father  Social History: current smoker, denies alcohol intake  Home BP readings: none provided  Wt Readings from Last 3 Encounters:  08/01/19 187 lb 3.2 oz (84.9 kg)  07/30/19 186 lb 3.2 oz (84.5 kg)  07/17/19 175 lb 6.4 oz (79.6 kg)   BP Readings from Last 3 Encounters:  08/01/19 110/73  07/30/19 116/62  07/17/19 130/74   Pulse Readings from Last 3 Encounters:  08/01/19 (!) 107  07/30/19 100  07/17/19 (!) 107    Renal function: CrCl cannot be calculated (Patient's most recent lab result is older than the maximum 21 days allowed.).  Past Medical History:  Diagnosis Date  . AMI (acute myocardial infarction) (HCC)   . Anxiety   . Asthma   . Coronary artery disease   . High cholesterol     . Hypertension     Current Outpatient Medications on File Prior to Visit  Medication Sig Dispense Refill  . atorvastatin (LIPITOR) 80 MG tablet Take 1 tablet (80 mg total) by mouth daily. 30 tablet 1  . FLUoxetine (PROZAC) 20 MG tablet Take 1 tablet (20 mg total) by mouth daily. 30 tablet 3  . furosemide (LASIX) 20 MG tablet Take 1 tablet (20 mg total) by mouth daily. 30 tablet 1  . insulin aspart (NOVOLOG) 100 UNIT/ML FlexPen Inject 8 Units into the skin 3 (three) times daily with meals. 15 mL 3  . insulin glargine (LANTUS) 100 UNIT/ML Solostar Pen Inject 25 Units into the skin 2 (two) times daily. 15 mL 3  . metFORMIN (GLUCOPHAGE) 1000 MG tablet Take 1 tablet (1,000 mg total) by mouth 2 (two) times daily with a meal. 180 tablet 3  . metoprolol succinate (TOPROL-XL) 100 MG 24 hr tablet Take 1 tablet (100 mg total) by mouth daily. 30 tablet 1  . nicotine (NICODERM CQ - DOSED IN MG/24 HOURS) 21 mg/24hr patch Place 1 patch (21 mg total) onto the skin daily. 28 patch 0  . nitroGLYCERIN (NITROSTAT) 0.4 MG SL tablet Place 1 tablet (0.4 mg total) under the tongue every 5 (five) minutes x 3 doses as  needed for chest pain. 30 tablet 0  . potassium chloride SA (KLOR-CON) 20 MEQ tablet Take 2 tablets (40 mEq total) by mouth daily. 6 tablet 0  . sacubitril-valsartan (ENTRESTO) 24-26 MG Take 1 tablet by mouth 2 (two) times daily. 60 tablet 11  . spironolactone (ALDACTONE) 25 MG tablet Take 1 tablet (25 mg total) by mouth daily. 15 tablet 1   No current facility-administered medications on file prior to visit.    Allergies  Allergen Reactions  . Penicillins Itching    There were no vitals taken for this visit.  No problem-specific Assessment & Plan notes found for this encounter.   Tommy Medal PharmD CPP [facility] [address] [place] [id number] 08/27/2019 7:32 AM

## 2019-09-29 NOTE — Progress Notes (Deleted)
Cardiology Office Note:    Date:  09/29/2019   ID:  [name], DOB 24-May-1965, MRN [id number]  PCP:  Grayce Sessions, NP  Cardiologist:  Little Ishikawa, MD  Electrophysiologist:  None   Referring MD: No ref. provider found   No chief complaint on file.   History of Present Illness:    Larry Lewis is a 54 y.o. male with a hx of severe multivessel CAD, combined systolic and diastolic heart failure, diabetes, hypertension, hyperlipidemia, asthma, tobacco use who presents for follow-up.  He was admitted to [facility] on 07/05/2019 with acute combined systolic and diastolic heart failure.  He reported a history of CAD, had 2 stents placed in 2007 in [place].  He had not followed with a cardiologist since that time.  He presented with volume overload, and was treated with IV Lasix.  TTE on 07/06/2019 showed LVEF 35 to 40%, normal RV function, possible bicuspid aortic valve (no AI or AS).  He underwent LHC/RHC on 4/20, which showed severe diffuse multivessel CAD, medical management recommended.  Right heart pressures were normal.  In addition he was diagnosed with type 2 diabetes, A1c 12.2%.  Also found to have LDL 211.  Since last clinic visit,  discharge from the hospital, he reports that he has been doing well.  States that he has had a few episodes of chest pain that he describes as far left sided pain that lasts about 10 seconds and resolves.  Denies any shortness of breath.  Reports he has been weighing himself and weight has been stable.  He continues to smoke, smokes 2 to 3 cigarettes/day.   Wt Readings from Last 3 Encounters:  08/01/19 187 lb 3.2 oz (84.9 kg)  07/30/19 186 lb 3.2 oz (84.5 kg)  07/17/19 175 lb 6.4 oz (79.6 kg)     Past Medical History:  Diagnosis Date  . AMI (acute myocardial infarction) (HCC)   . Anxiety   . Asthma   . Coronary artery disease   . High cholesterol   . Hypertension     Past Surgical History:  Procedure Laterality Date  . CARDIAC  CATHETERIZATION    . RIGHT/LEFT HEART CATH AND CORONARY ANGIOGRAPHY N/A 07/09/2019   Procedure: RIGHT/LEFT HEART CATH AND CORONARY ANGIOGRAPHY;  Surgeon: Lennette Bihari, MD;  Location: [facility] INVASIVE CV LAB;  Service: Cardiovascular;  Laterality: N/A;    Current Medications: No outpatient medications have been marked as taking for the 09/30/19 encounter (Appointment) with Little Ishikawa, MD.     Allergies:   Penicillins   Social History   Socioeconomic History  . Marital status: Single    Spouse name: Not on file  . Number of children: Not on file  . Years of education: Not on file  . Highest education level: Not on file  Occupational History  . Not on file  Tobacco Use  . Smoking status: Current Every Day Smoker    Packs/day: 1.00    Types: Cigarettes  . Smokeless tobacco: Never Used  Vaping Use  . Vaping Use: Never used  Substance and Sexual Activity  . Alcohol use: Never  . Drug use: Never  . Sexual activity: Not on file  Other Topics Concern  . Not on file  Social History Narrative  . Not on file   Social Determinants of Health   Financial Resource Strain:   . Difficulty of Paying Living Expenses:   Food Insecurity:   . Worried About Programme researcher, broadcasting/film/video in the  Last Year:   . Ran Out of Food in the Last Year:   Transportation Needs:   . Freight forwarder (Medical):   Marland Kitchen Lack of Transportation (Non-Medical):   Physical Activity:   . Days of Exercise per Week:   . Minutes of Exercise per Session:   Stress:   . Feeling of Stress :   Social Connections:   . Frequency of Communication with Friends and Family:   . Frequency of Social Gatherings with Friends and Family:   . Attends Religious Services:   . Active Member of Clubs or Organizations:   . Attends Banker Meetings:   Marland Kitchen Marital Status:      Family History: The patient's family history is not on file.  ROS:   Please see the history of present illness.     All other systems  reviewed and are negative.  EKGs/Labs/Other Studies Reviewed:    The following studies were reviewed today:   EKG:  EKG is  ordered today.  The ekg ordered today demonstrates sinus tachycardia, rate 107, QTc 480 T wave inversions inferior leads  TTE 07/06/19: 1. Left ventricular ejection fraction, by estimation, is 35 to 40%. The  left ventricle has moderately decreased function. The left ventricle  demonstrates regional wall motion abnormalities (see scoring  diagram/findings for description). Left ventricular  diastolic parameters are indeterminate.  2. Right ventricular systolic function is normal. The right ventricular  size is normal.  3. Left atrial size was moderately dilated.  4. The mitral valve is normal in structure. Mild mitral valve  regurgitation. No evidence of mitral stenosis.  5. Indeterminate cusp structure. Given age and amount of calcification,  high suspicion for bicuspid valve, either functional or congenital.. The  aortic valve has an indeterminant number of cusps. Aortic valve  regurgitation is not visualized. Mild to  moderate aortic valve sclerosis/calcification is present, without any  evidence of aortic stenosis.  6. The inferior vena cava is normal in size with greater than 50%  respiratory variability, suggesting right atrial pressure of 3 mmHg.   RHC/LHC 07/09/19:  Mid RCA to Dist RCA lesion is 100% stenosed.  Acute Mrg lesion is 90% stenosed.  Ost RCA to Prox RCA lesion is 70% stenosed.  Prox RCA to Mid RCA lesion is 90% stenosed.  1st Mrg lesion is 85% stenosed.  2nd Mrg-1 lesion is 100% stenosed.  2nd Mrg-2 lesion is 100% stenosed.  3rd Mrg lesion is 90% stenosed.  Dist Cx lesion is 70% stenosed.  Dist LAD lesion is 100% stenosed.  2nd Diag lesion is 95% stenosed.  Mid LAD lesion is 50% stenosed.  1st Diag-2 lesion is 40% stenosed.  1st Diag-1 lesion is 80% stenosed.  Mid Cx to Dist Cx lesion is 30% stenosed.   Severe  diffuse multivessel CAD with moderate irregularity of the LAD with 80% proximal diagonal stenosis prior to a previously placed stent with intimal hyperplasia within the stented segment, 50% the stenosis, 95% stenosis in the second diagonal vessel and total occlusion of the apical LAD; left circumflex vessel with large OM1 vessel with 85% stenosis proximal to an aneurysmal segment, total occlusion of the OM 2 vessel with previously placed stent in this vessel and 90 and 70% bifurcation stenoses in the distal circumflex; severely diffusely diseased RCA with distal occlusion.  There is faint filling of a probable small PLA vessel via the left injection.  Normal right heart pressures.  LVEDP 16 mmHg.  RECOMMENDATION: Plan initiate medical therapy  since at present he is not on any anti-ischemic medications.  Most vessels are not amenable to intervention; however if patient experiences increasing chest pain symptomatology the large OM1 vessel can potentially undergo intervention with stenting.  Aggressive lipid-lowering therapy with target LDL less than 70.  Optimal blood pressure control.  Smoking cessation is essential.  With diffuse CAD consider DAPT therapy.  Recent Labs: 07/04/2019: B Natriuretic Peptide 267.6 07/06/2019: TSH 0.527 07/11/2019: ALT 24; Hemoglobin 15.7; Platelets 285 07/17/2019: BUN 17; Creatinine, Ser 0.87; Magnesium 2.0; Potassium 4.6; Sodium 137  Recent Lipid Panel    Component Value Date/Time   CHOL 274 (H) 07/07/2019 0336   TRIG 145 07/07/2019 0336   HDL 34 (L) 07/07/2019 0336   CHOLHDL 8.1 07/07/2019 0336   VLDL 29 07/07/2019 0336   LDLCALC 211 (H) 07/07/2019 0336    Physical Exam:    VS:  There were no vitals taken for this visit.    Wt Readings from Last 3 Encounters:  08/01/19 187 lb 3.2 oz (84.9 kg)  07/30/19 186 lb 3.2 oz (84.5 kg)  07/17/19 175 lb 6.4 oz (79.6 kg)     GEN: in no acute distress HEENT: Normal NECK: No JVD CARDIAC:RRR, no murmurs, rubs,  gallops RESPIRATORY:  Clear to auscultation without rales, wheezing or rhonchi  ABDOMEN: Soft, non-tender, non-distended MUSCULOSKELETAL:  No edema SKIN: Warm and dry NEUROLOGIC:  Alert and oriented x 3 PSYCHIATRIC:  Normal affect   ASSESSMENT:    No diagnosis found. PLAN:     CAD: Severe multivessel CAD not amenable to intervention on catheterization 07/09/2019. -Continue aspirin 81 mg daily and Plavix 75 mg daily -Continue Toprol-XL 100 mg daily -Sublingual nitroglycerin as needed  Acute combined systolic and diastolic heart failure: EF 35 to 40% on TTE 07/06/2018.  Diuresed with IV Lasix during recent admission, RHC on 07/09/2019 showed normal right heart pressures -Continue Entresto 24-26 mg twice daily -Continue Toprol-XL 100 mg daily -Continue spironolactone 25 mg daily -Appears euvolemic, continue Lasix 20 mg daily.  Will check BMP/magnesium -Will schedule follow-up in pharmacy clinic in 2 weeks to titrate heart failure medications  Hyperlipidemia: LDL 211.  Started on atorvastatin 80 mg daily  Type 2 diabetes: A1c 12.2.  Started on insulin during recent admission.  Tobacco use: Patient counseled on risks of tobacco use and cessation strongly encouraged.   RTC in ***   Medication Adjustments/Labs and Tests Ordered: Current medicines are reviewed at length with the patient today.  Concerns regarding medicines are outlined above.  No orders of the defined types were placed in this encounter.  No orders of the defined types were placed in this encounter.   There are no Patient Instructions on file for this visit.   Signed, Little Ishikawa, MD  09/29/2019 9:49 PM    [facility]

## 2019-09-30 ENCOUNTER — Ambulatory Visit: Payer: Self-pay | Admitting: Cardiology

## 2019-11-04 ENCOUNTER — Ambulatory Visit (INDEPENDENT_AMBULATORY_CARE_PROVIDER_SITE_OTHER): Payer: Self-pay | Admitting: Primary Care

## 2020-02-24 ENCOUNTER — Ambulatory Visit (INDEPENDENT_AMBULATORY_CARE_PROVIDER_SITE_OTHER): Payer: Self-pay | Admitting: Cardiology

## 2020-02-24 ENCOUNTER — Other Ambulatory Visit: Payer: Self-pay

## 2020-02-24 ENCOUNTER — Encounter: Payer: Self-pay | Admitting: Cardiology

## 2020-02-24 ENCOUNTER — Other Ambulatory Visit: Payer: Self-pay | Admitting: Cardiology

## 2020-02-24 VITALS — BP 134/80 | HR 96 | Ht 69.0 in | Wt 192.0 lb

## 2020-02-24 DIAGNOSIS — I25119 Atherosclerotic heart disease of native coronary artery with unspecified angina pectoris: Secondary | ICD-10-CM

## 2020-02-24 DIAGNOSIS — E119 Type 2 diabetes mellitus without complications: Secondary | ICD-10-CM

## 2020-02-24 DIAGNOSIS — Z79899 Other long term (current) drug therapy: Secondary | ICD-10-CM

## 2020-02-24 DIAGNOSIS — Z794 Long term (current) use of insulin: Secondary | ICD-10-CM

## 2020-02-24 DIAGNOSIS — M79604 Pain in right leg: Secondary | ICD-10-CM

## 2020-02-24 DIAGNOSIS — I5043 Acute on chronic combined systolic (congestive) and diastolic (congestive) heart failure: Secondary | ICD-10-CM

## 2020-02-24 DIAGNOSIS — E785 Hyperlipidemia, unspecified: Secondary | ICD-10-CM

## 2020-02-24 DIAGNOSIS — M79605 Pain in left leg: Secondary | ICD-10-CM

## 2020-02-24 LAB — LIPID PANEL

## 2020-02-24 MED ORDER — CLOPIDOGREL BISULFATE 75 MG PO TABS: 75.0000 mg | ORAL_TABLET | Freq: Every day | ORAL | 5 refills | Status: DC

## 2020-02-24 MED ORDER — FUROSEMIDE 20 MG PO TABS: 20.0000 mg | ORAL_TABLET | Freq: Every day | ORAL | 5 refills | Status: DC

## 2020-02-24 MED ORDER — METFORMIN HCL 1000 MG PO TABS: 1000.0000 mg | ORAL_TABLET | Freq: Two times a day (BID) | ORAL | 0 refills | Status: DC

## 2020-02-24 MED ORDER — ATORVASTATIN CALCIUM 80 MG PO TABS: 80.0000 mg | ORAL_TABLET | Freq: Every day | ORAL | 5 refills | Status: DC

## 2020-02-24 MED ORDER — METOPROLOL SUCCINATE ER 25 MG PO TB24: 25.0000 mg | ORAL_TABLET | Freq: Every day | ORAL | 5 refills | Status: DC

## 2020-02-24 MED ORDER — INSULIN GLARGINE 100 UNIT/ML SOLOSTAR PEN: 25.0000 [IU] | PEN_INJECTOR | Freq: Two times a day (BID) | SUBCUTANEOUS | 0 refills | Status: DC

## 2020-02-24 MED ORDER — NITROGLYCERIN 0.4 MG SL SUBL: 0.4000 mg | SUBLINGUAL_TABLET | SUBLINGUAL | 3 refills | Status: DC | PRN

## 2020-02-24 MED ORDER — INSULIN ASPART 100 UNIT/ML FLEXPEN: 8.0000 [IU] | PEN_INJECTOR | Freq: Three times a day (TID) | SUBCUTANEOUS | 0 refills | Status: DC

## 2020-02-24 MED ORDER — LOSARTAN POTASSIUM 25 MG PO TABS: 25.0000 mg | ORAL_TABLET | Freq: Every day | ORAL | 5 refills | Status: DC

## 2020-02-24 MED FILL — FUROSEMIDE 20 MG TABS: 20 | 30 days supply | Qty: 30 | Fill #0

## 2020-02-24 MED FILL — !NOVOLOG FLEXPEN SYRINGE 1: 100/ML | 25 days supply | Qty: 6 | Fill #0

## 2020-02-24 MED FILL — METFORMIN HCL 1000 MG TABS: 1000 | 30 days supply | Qty: 60 | Fill #0

## 2020-02-24 MED FILL — NITROGLYCERIN 0.4 MG TAB SL: 0.4 | 25 days supply | Qty: 25 | Fill #0

## 2020-02-24 MED FILL — CLOPIDOGREL 75 MG TABLET: 75 | 30 days supply | Qty: 30 | Fill #0

## 2020-02-24 MED FILL — ATORVASTATIN CALCIUM 80 MG: 80 | 30 days supply | Qty: 30 | Fill #0

## 2020-02-24 MED FILL — LOSARTAN POTASSIUM 25 MG TA: 25 | 30 days supply | Qty: 30 | Fill #0

## 2020-02-24 MED FILL — METOPROLOL SUCCINATE ER 25: 25 | 30 days supply | Qty: 30 | Fill #0

## 2020-02-24 NOTE — Patient Instructions (Signed)
Medication Instructions:   RESTART: Aspirin 81 mg daily Plavix 75 mg daily Atorvastatin (Lipitor) 80 mg daily Furosemide (Lasix) 20 mg daily Metoprolol succinate (Toprol XL) 25 mg daily Losartan 25 mg daily  Lantus and Novolog as prescribed-FOLLOW UP WITH Primary doctor   Take sublingual nitroglycerin AS NEEDED  *If you need a refill on your cardiac medications before your next appointment, please call your pharmacy*   Lab Work: CMET, CBC, TSH, Lipid, A1C today  If you have labs (blood work) drawn today and your tests are completely normal, you will receive your results only by: Marland Kitchen MyChart Message (if you have MyChart) OR . A paper copy in the mail If you have any lab test that is abnormal or we need to change your treatment, we will call you to review the results.   Testing/Procedures: Your physician has requested that you have an echocardiogram. Echocardiography is a painless test that uses sound waves to create images of your heart. It provides your doctor with information about the size and shape of your heart and how well your heart's chambers and valves are working. This procedure takes approximately one hour. There are no restrictions for this procedure. This will be done at our [facility] location:  [address]  Your physician has requested that you have an ankle brachial index (ABI). During this test an ultrasound and blood pressure cuff are used to evaluate the arteries that supply the arms and legs with blood. Allow thirty minutes for this exam. There are no restrictions or special instructions.  Follow-Up: At [facility], you and your health needs are our priority.  As part of our continuing mission to provide you with exceptional heart care, we have created designated Provider Care Teams.  These Care Teams include your primary Cardiologist (physician) and Advanced Practice Providers (APPs -  Physician Assistants and Nurse Practitioners) who all  work together to provide you with the care you need, when you need it.  We recommend signing up for the patient portal called "MyChart".  Sign up information is provided on this After Visit Summary.  MyChart is used to connect with patients for Virtual Visits (Telemedicine).  Patients are able to view lab/test results, encounter notes, upcoming appointments, etc.  Non-urgent messages can be sent to your provider as well.   To learn more about what you can do with MyChart, go to [email or web address].    Your next appointment:   2 weeks with pharmacist (BRING ALL MEDICATIONS) 6 weeks with Dr. Bjorn Pippin   Other Instructions You have been referred to Endocrinology

## 2020-02-24 NOTE — Progress Notes (Signed)
Heart and Vascular Care Navigation  02/24/2020  Larry Lewis Jun 18, 1965 [id number]  Reason for Referral:  Pt is uninsured/financial assistance needs.                                                                                                 Assessment:  CSW contacted by [employer] as pt is uninsured, has current financial concerns. CSW met with pt and pt friend Larry Lewis in exam room. Introduced self, role, reason for visit. Pt speaks Arabic but also understands and speaks [place]. He is not as confident with his English and therefore Larry Lewis assists him w/ any interpreter needs. [employer] interpreter services arrived during my visit but he declines at this time stating he prefers to just utilize his friend.   Pt confirms mailing address, emergency contacts and added his new number [phone]. He currently stays with [facility] and has assistance w/ daily needs from community/friends including . He had been hospitalized at the end of April and had started Medicaid application at that time. However unfortunately both of his parents became ill and died over the summer. While he was out of the country he had received additional documents to complete for applications and unfortunately missed the window to complete those. Pt friend states they were given an appeal time period but he still missed that window too. Pt interested in working on completing those at this time.  CSW provided pt friend w/ Medicaid application and CAFA application. I explained that they would need to gather documentation requested to complete paperwork. They understand that Medicaid application questions can be communicated to [facility] and that I would be able to get any questions related to CAFA ([place]). Pt with upcoming PCP appointment at [place].   Pt provided w/ my number for any f/u needs. CSW also obtained Larry Lewis's number and email [email or web address].                                 HRT/VAS Care Coordination    Patients Home Cardiology Office [place] Team Social Worker   Social Worker Name: Westley Hummer, LCSW, [facility]   Living arrangements for the past 2 months Apartment   Lives with: Friends   Patient Current Insurance Coverage Self-Pay   Patient Has Concern With Paying Medical Bills Yes   Patient Concerns With Medical Bills previous medical bills, ongoing care needs   Medical Bill Referrals: CAFA/Medicaid applications   Does Patient Have Prescription Coverage? No   Patient Prescription Assistance Programs [place] [facility]   Home Assistive Devices/Equipment Scales   DME Agency NA      Social History:  SDOH Screenings   Alcohol Screen:   . Last Alcohol Screening Score (AUDIT): Not on file  Depression (PHQ2-9): Medium Risk  . PHQ-2 Score: 22  Financial Resource Strain: High Risk  . Difficulty of Paying Living Expenses: Hard  Food Insecurity: No Food Insecurity  . Worried About Charity fundraiser in the Last Year: Never true  . Ran Out of Food in the Last Year: Never true  Housing: Low Risk   . Last Housing Risk Score: 0  Physical Activity:   . Days of Exercise per Week: Not on file  . Minutes of Exercise per Session: Not on file  Social Connections:   . Frequency of Communication with Friends and Family: Not on file  . Frequency of Social Gatherings with Friends and Family: Not on file  . Attends Religious Services: Not on file  . Active Member of Clubs or Organizations: Not on file  . Attends Archivist Meetings: Not on file  . Marital Status: Not on file  Stress:   . Feeling of Stress : Not on file  Tobacco Use: High Risk  . Smoking Tobacco Use: Current Every Day Smoker  . Smokeless Tobacco Use: Never Used  Transportation Needs: No Transportation Needs  . Lack of Transportation (Medical): No  . Lack  of Transportation (Non-Medical): No    SDOH Interventions: Financial Resources:  Sales promotion account executive Interventions: Nurse, children's Insecurity:   Not noted, will provide food assistance resources in case to friend [place]:  Housing Interventions: Intervention Not Indicated  Transportation:   Transportation Interventions: Intervention Not Indicated    Other Care Navigation Interventions:     Provided Pharmacy assistance resources [place] [facility]   Follow-up plan:   CSW will f/u with pt for any questions related to the paperwork provided today. CSW will also f/u with pt friend Larry Lewis regarding any additional resources that may be applicable.

## 2020-02-24 NOTE — Progress Notes (Addendum)
Cardiology Office Note:    Date:  02/24/2020   ID:  [name], DOB 1965/03/25, MRN [id number]  PCP:  Grayce Sessions, NP  Cardiologist:  Little Ishikawa, MD  Electrophysiologist:  None   Referring MD: Grayce Sessions, NP   Chief Complaint  Patient presents with  . Congestive Heart Failure    History of Present Illness:    Larry Lewis is a 54 y.o. male with a hx of severe multivessel CAD, combined systolic and diastolic heart failure, diabetes, hypertension, hyperlipidemia, asthma, tobacco use who presents as a hospital follow-up.  He was admitted to [facility] on 07/05/2019 with acute combined systolic and diastolic heart failure.  He reported a history of CAD, had 2 stents placed in 2007 in [place].  He had not followed with a cardiologist since that time.  He presented with volume overload, and was treated with IV Lasix.  TTE on 07/06/2019 showed LVEF 35 to 40%, normal RV function, possible bicuspid aortic valve (no AI or AS).  He underwent LHC/RHC on 4/20, which showed severe diffuse multivessel CAD, medical management recommended.  Right heart pressures were normal.  In addition he was diagnosed with type 2 diabetes, A1c 12.2%.  Also found to have LDL 211.  Patient was last seen in clinic in April 2021.  He had to return to [place] for 6 months as both of his parents were ill.  States that he ran out of his medications several months ago.  He tried buying some medications in [place] which he thought were similar to what he was taking here, but has been off all his medications for about 1 month.  Reports he has been having heaviness in his chest with exertion.  He has run out of nitroglycerin.  States that he has chest pain occurs about 2 to 3 days/week.  Always resolves with nitroglycerin.  Also reports he has been having shortness of breath.  He denies any lightheadedness or syncope.  He continues to smoke, about 4 to 5 cigarettes/day.    Wt Readings from Last 3 Encounters:   02/24/20 192 lb (87.1 kg)  08/01/19 187 lb 3.2 oz (84.9 kg)  07/30/19 186 lb 3.2 oz (84.5 kg)     Past Medical History:  Diagnosis Date  . AMI (acute myocardial infarction) (HCC)   . Anxiety   . Asthma   . Coronary artery disease   . High cholesterol   . Hypertension     Past Surgical History:  Procedure Laterality Date  . CARDIAC CATHETERIZATION    . RIGHT/LEFT HEART CATH AND CORONARY ANGIOGRAPHY N/A 07/09/2019   Procedure: RIGHT/LEFT HEART CATH AND CORONARY ANGIOGRAPHY;  Surgeon: Lennette Bihari, MD;  [facility]: [facility];  Service: Cardiovascular;  Laterality: N/A;    Current Medications: Current Meds  Medication Sig  . aspirin EC 81 MG tablet Take 81 mg by mouth daily. Swallow whole.  . clopidogrel (PLAVIX) 75 MG tablet Take 1 tablet (75 mg total) by mouth daily.  . [DISCONTINUED] clopidogrel (PLAVIX) 75 MG tablet Take 75 mg by mouth daily.     Allergies:   Penicillins   Social History   Socioeconomic History  . Marital status: Single    Spouse name: Not on file  . Number of children: Not on file  . Years of education: Not on file  . Highest education level: Not on file  Occupational History  . Not on file  Tobacco Use  . Smoking status: Current Every Day  Smoker    Packs/day: 1.00    Types: Cigarettes  . Smokeless tobacco: Never Used  Vaping Use  . Vaping Use: Never used  Substance and Sexual Activity  . Alcohol use: Never  . Drug use: Never  . Sexual activity: Not on file  Other Topics Concern  . Not on file  Social History Narrative  . Not on file   Social Determinants of Health   Financial Resource Strain: High Risk  . Difficulty of Paying Living Expenses: Hard  Food Insecurity: No Food Insecurity  . Worried About Programme researcher, broadcasting/film/video in the Last Year: Never true  . Ran Out of Food in the Last Year: Never true  Transportation Needs: No Transportation Needs  . Lack of Transportation (Medical): No  . Lack of Transportation  (Non-Medical): No  Physical Activity:   . Days of Exercise per Week: Not on file  . Minutes of Exercise per Session: Not on file  Stress:   . Feeling of Stress : Not on file  Social Connections:   . Frequency of Communication with Friends and Family: Not on file  . Frequency of Social Gatherings with Friends and Family: Not on file  . Attends Religious Services: Not on file  . Active Member of Clubs or Organizations: Not on file  . Attends Banker Meetings: Not on file  . Marital Status: Not on file     Family History: The patient's family history is not on file.  ROS:   Please see the history of present illness.     All other systems reviewed and are negative.  EKGs/Labs/Other Studies Reviewed:    The following studies were reviewed today:   EKG:  EKG is  ordered today.  The ekg ordered today demonstrates sinus rhythm, rate 96, less than 1 mm ST depressions in leads I, II, aVL, V5/6  TTE 07/06/19: 1. Left ventricular ejection fraction, by estimation, is 35 to 40%. The  left ventricle has moderately decreased function. The left ventricle  demonstrates regional wall motion abnormalities (see scoring  diagram/findings for description). Left ventricular  diastolic parameters are indeterminate.  2. Right ventricular systolic function is normal. The right ventricular  size is normal.  3. Left atrial size was moderately dilated.  4. The mitral valve is normal in structure. Mild mitral valve  regurgitation. No evidence of mitral stenosis.  5. Indeterminate cusp structure. Given age and amount of calcification,  high suspicion for bicuspid valve, either functional or congenital.. The  aortic valve has an indeterminant number of cusps. Aortic valve  regurgitation is not visualized. Mild to  moderate aortic valve sclerosis/calcification is present, without any  evidence of aortic stenosis.  6. The inferior vena cava is normal in size with greater than 50%   respiratory variability, suggesting right atrial pressure of 3 mmHg.   RHC/LHC 07/09/19:  Mid RCA to Dist RCA lesion is 100% stenosed.  Acute Mrg lesion is 90% stenosed.  Ost RCA to Prox RCA lesion is 70% stenosed.  Prox RCA to Mid RCA lesion is 90% stenosed.  1st Mrg lesion is 85% stenosed.  2nd Mrg-1 lesion is 100% stenosed.  2nd Mrg-2 lesion is 100% stenosed.  3rd Mrg lesion is 90% stenosed.  Dist Cx lesion is 70% stenosed.  Dist LAD lesion is 100% stenosed.  2nd Diag lesion is 95% stenosed.  Mid LAD lesion is 50% stenosed.  1st Diag-2 lesion is 40% stenosed.  1st Diag-1 lesion is 80% stenosed.  Mid Cx  to Dist Cx lesion is 30% stenosed.   Severe diffuse multivessel CAD with moderate irregularity of the LAD with 80% proximal diagonal stenosis prior to a previously placed stent with intimal hyperplasia within the stented segment, 50% the stenosis, 95% stenosis in the second diagonal vessel and total occlusion of the apical LAD; left circumflex vessel with large OM1 vessel with 85% stenosis proximal to an aneurysmal segment, total occlusion of the OM 2 vessel with previously placed stent in this vessel and 90 and 70% bifurcation stenoses in the distal circumflex; severely diffusely diseased RCA with distal occlusion.  There is faint filling of a probable small PLA vessel via the left injection.  Normal right heart pressures.  LVEDP 16 mmHg.  RECOMMENDATION: Plan initiate medical therapy since at present he is not on any anti-ischemic medications.  Most vessels are not amenable to intervention; however if patient experiences increasing chest pain symptomatology the large OM1 vessel can potentially undergo intervention with stenting.  Aggressive lipid-lowering therapy with target LDL less than 70.  Optimal blood pressure control.  Smoking cessation is essential.  With diffuse CAD consider DAPT therapy.  Recent Labs: 07/04/2019: B Natriuretic Peptide 267.6 07/06/2019: TSH  0.527 07/11/2019: ALT 24; Hemoglobin 15.7; Platelets 285 07/17/2019: BUN 17; Creatinine, Ser 0.87; Magnesium 2.0; Potassium 4.6; Sodium 137  Recent Lipid Panel    Component Value Date/Time   CHOL 274 (H) 07/07/2019 0336   TRIG 145 07/07/2019 0336   HDL 34 (L) 07/07/2019 0336   CHOLHDL 8.1 07/07/2019 0336   VLDL 29 07/07/2019 0336   LDLCALC 211 (H) 07/07/2019 0336    Physical Exam:    VS:  BP 134/80   Pulse 96   Ht 5\' 9"  (1.753 m)   Wt 192 lb (87.1 kg)   SpO2 97%   BMI 28.35 kg/m     Wt Readings from Last 3 Encounters:  02/24/20 192 lb (87.1 kg)  08/01/19 187 lb 3.2 oz (84.9 kg)  07/30/19 186 lb 3.2 oz (84.5 kg)     GEN: in no acute distress HEENT: Normal NECK: No JVD CARDIAC:RRR, no murmurs, rubs, gallops RESPIRATORY:  Clear to auscultation without rales, wheezing or rhonchi  ABDOMEN: Soft, non-tender, non-distended MUSCULOSKELETAL:  No edema SKIN: Warm and dry NEUROLOGIC:  Alert and oriented x 3 PSYCHIATRIC:  Normal affect   ASSESSMENT:    1. Acute on chronic combined systolic and diastolic congestive heart failure (HCC)   2. Coronary artery disease involving native coronary artery of native heart with angina pectoris (HCC)   3. Medication management   4. Hyperlipidemia, unspecified hyperlipidemia type   5. Type 2 diabetes mellitus without complication, with long-term current use of insulin (HCC)   6. Pain in both lower extremities    PLAN:     CAD: Severe multivessel CAD not amenable to intervention on catheterization 07/09/2019. -Has been off all his medications, will restart aspirin 81 mg daily and Plavix 75 mg daily -Start Toprol-XL 25 mg daily -Sublingual nitroglycerin as needed  Acute combined systolic and diastolic heart failure: EF 35 to 40% on TTE 07/06/2019 -Start losartan 25 mg daily and Toprol-XL 25 mg daily.  Will hold off on starting spironolactone right now, will make sure he is tolerating losartan and Toprol, plan to restart spironolactone on  follow-up -Does not appear volume overloaded on exam, but weight is up 17 pounds from last clinic visit in April, and he has been off all his medications.  Will restart Lasix 20 mg daily.  Will check  CMP -Will schedule follow-up in pharmacy clinic in 2 weeks to titrate heart failure medications  Hyperlipidemia: LDL 211.  Started on atorvastatin 80 mg daily but has been off all medications.  Will restart atorvastatin 80 mg daily and check lipid panel.  Type 2 diabetes: A1c 12.2.  Has been off his insulin.  Will prescribe until he can be seen by his PCP.  Will refer to endocrinology  Tobacco use: Patient counseled on risks of tobacco use and cessation strongly encouraged.  Lower extremity pain: Check ABIs    RTC in 2 months   Medication Adjustments/Labs and Tests Ordered: Current medicines are reviewed at length with the patient today.  Concerns regarding medicines are outlined above.  Orders Placed This Encounter  Procedures  . CBC  . Comprehensive metabolic panel  . Lipid panel  . Hemoglobin A1c  . TSH  . Ambulatory referral to Endocrinology  . EKG 12-Lead  . ECHOCARDIOGRAM COMPLETE  . VAS [place] ABI WITH/WO TBI  . VAS [place] LOWER EXTREMITY ARTERIAL DUPLEX   Meds ordered this encounter  Medications  . atorvastatin (LIPITOR) 80 MG tablet    Sig: Take 1 tablet (80 mg total) by mouth daily.    Dispense:  30 tablet    Refill:  5  . clopidogrel (PLAVIX) 75 MG tablet    Sig: Take 1 tablet (75 mg total) by mouth daily.    Dispense:  30 tablet    Refill:  5  . furosemide (LASIX) 20 MG tablet    Sig: Take 1 tablet (20 mg total) by mouth daily.    Dispense:  30 tablet    Refill:  5  . insulin aspart (NOVOLOG) 100 UNIT/ML FlexPen    Sig: Inject 8 Units into the skin 3 (three) times daily with meals. Defer refills to PCP    Dispense:  15 mL    Refill:  0  . insulin glargine (LANTUS) 100 UNIT/ML Solostar Pen    Sig: Inject 25 Units into the skin 2 (two) times daily. Defer refills to PCP     Dispense:  15 mL    Refill:  0  . metFORMIN (GLUCOPHAGE) 1000 MG tablet    Sig: Take 1 tablet (1,000 mg total) by mouth 2 (two) times daily with a meal. Defer refills to PCP    Dispense:  60 tablet    Refill:  0  . metoprolol succinate (TOPROL-XL) 25 MG 24 hr tablet    Sig: Take 1 tablet (25 mg total) by mouth daily.    Dispense:  30 tablet    Refill:  5    Dose increase  . nitroGLYCERIN (NITROSTAT) 0.4 MG SL tablet    Sig: Place 1 tablet (0.4 mg total) under the tongue every 5 (five) minutes x 3 doses as needed for chest pain.    Dispense:  25 tablet    Refill:  3  . losartan (COZAAR) 25 MG tablet    Sig: Take 1 tablet (25 mg total) by mouth daily.    Dispense:  30 tablet    Refill:  5    Patient Instructions  Medication Instructions:   RESTART: Aspirin 81 mg daily Plavix 75 mg daily Atorvastatin (Lipitor) 80 mg daily Furosemide (Lasix) 20 mg daily Metoprolol succinate (Toprol XL) 25 mg daily Losartan 25 mg daily  Lantus and Novolog as prescribed-FOLLOW UP WITH Primary doctor   Take sublingual nitroglycerin AS NEEDED  *If you need a refill on your cardiac medications before your next appointment,  please call your pharmacy*   Lab Work: CMET, CBC, TSH, Lipid, A1C today  If you have labs (blood work) drawn today and your tests are completely normal, you will receive your results only by: Marland Kitchen MyChart Message (if you have MyChart) OR . A paper copy in the mail If you have any lab test that is abnormal or we need to change your treatment, we will call you to review the results.   Testing/Procedures: Your physician has requested that you have an echocardiogram. Echocardiography is a painless test that uses sound waves to create images of your heart. It provides your doctor with information about the size and shape of your heart and how well your heart's chambers and valves are working. This procedure takes approximately one hour. There are no restrictions for this  procedure. This will be done at our [facility] location:  [address]  Your physician has requested that you have an ankle brachial index (ABI). During this test an ultrasound and blood pressure cuff are used to evaluate the arteries that supply the arms and legs with blood. Allow thirty minutes for this exam. There are no restrictions or special instructions.  Follow-Up: At [facility], you and your health needs are our priority.  As part of our continuing mission to provide you with exceptional heart care, we have created designated Provider Care Teams.  These Care Teams include your primary Cardiologist (physician) and Advanced Practice Providers (APPs -  Physician Assistants and Nurse Practitioners) who all work together to provide you with the care you need, when you need it.  We recommend signing up for the patient portal called "MyChart".  Sign up information is provided on this After Visit Summary.  MyChart is used to connect with patients for Virtual Visits (Telemedicine).  Patients are able to view lab/test results, encounter notes, upcoming appointments, etc.  Non-urgent messages can be sent to your provider as well.   To learn more about what you can do with MyChart, go to [email or web address].    Your next appointment:   2 weeks with pharmacist (BRING ALL MEDICATIONS) 6 weeks with Dr. Bjorn Pippin   Other Instructions You have been referred to Endocrinology      Signed, Little Ishikawa, MD  02/24/2020 10:13 PM    [facility]

## 2020-02-25 LAB — CBC
Hematocrit: 47.5 % (ref 37.5–51.0)
Hemoglobin: 15 g/dL (ref 13.0–17.7)
MCH: 24.6 pg — ABNORMAL LOW (ref 26.6–33.0)
MCHC: 31.6 g/dL (ref 31.5–35.7)
MCV: 78 fL — ABNORMAL LOW (ref 79–97)
Platelets: 304 10*3/uL (ref 150–450)
RBC: 6.09 x10E6/uL — ABNORMAL HIGH (ref 4.14–5.80)
RDW: 13.4 % (ref 11.6–15.4)
WBC: 9.1 10*3/uL (ref 3.4–10.8)

## 2020-02-25 LAB — COMPREHENSIVE METABOLIC PANEL
ALT: 27 IU/L (ref 0–44)
AST: 16 IU/L (ref 0–40)
Albumin/Globulin Ratio: 1.7 (ref 1.2–2.2)
Albumin: 4.8 g/dL (ref 3.8–4.9)
Alkaline Phosphatase: 55 IU/L (ref 44–121)
BUN/Creatinine Ratio: 10 (ref 9–20)
BUN: 9 mg/dL (ref 6–24)
Bilirubin Total: 0.2 mg/dL (ref 0.0–1.2)
CO2: 24 mmol/L (ref 20–29)
Calcium: 9.9 mg/dL (ref 8.7–10.2)
Chloride: 98 mmol/L (ref 96–106)
Creatinine, Ser: 0.89 mg/dL (ref 0.76–1.27)
GFR calc Af Amer: 111 mL/min/{1.73_m2} (ref >59)
GFR calc non Af Amer: 96 mL/min/{1.73_m2} (ref >59)
Globulin, Total: 2.9 g/dL (ref 1.5–4.5)
Glucose: 208 mg/dL — ABNORMAL HIGH (ref 65–99)
Potassium: 4.6 mmol/L (ref 3.5–5.2)
Sodium: 135 mmol/L (ref 134–144)
Total Protein: 7.7 g/dL (ref 6.0–8.5)

## 2020-02-25 LAB — LIPID PANEL
Chol/HDL Ratio: 8.5 ratio — ABNORMAL HIGH (ref 0.0–5.0)
Cholesterol, Total: 280 mg/dL — ABNORMAL HIGH (ref 100–199)
HDL: 33 mg/dL — ABNORMAL LOW (ref >39)
LDL Chol Calc (NIH): 190 mg/dL — ABNORMAL HIGH (ref 0–99)
Triglycerides: 287 mg/dL — ABNORMAL HIGH (ref 0–149)
VLDL Cholesterol Cal: 57 mg/dL — ABNORMAL HIGH (ref 5–40)

## 2020-02-25 LAB — TSH: TSH: 1.07 u[IU]/mL (ref 0.450–4.500)

## 2020-02-25 LAB — HEMOGLOBIN A1C
Est. average glucose Bld gHb Est-mCnc: 206 mg/dL
Hgb A1c MFr Bld: 8.8 % — ABNORMAL HIGH (ref 4.8–5.6)

## 2020-02-25 MED FILL — ?BASAGLAR 100 UNITS/ML KWPE: 100 | 30 days supply | Qty: 15 | Fill #0

## 2020-02-26 ENCOUNTER — Other Ambulatory Visit: Payer: Self-pay | Admitting: *Deleted

## 2020-02-26 ENCOUNTER — Telehealth: Payer: Self-pay | Admitting: Licensed Clinical Social Worker

## 2020-02-26 DIAGNOSIS — Z79899 Other long term (current) drug therapy: Secondary | ICD-10-CM

## 2020-02-26 DIAGNOSIS — I5043 Acute on chronic combined systolic (congestive) and diastolic (congestive) heart failure: Secondary | ICD-10-CM

## 2020-02-26 NOTE — Telephone Encounter (Signed)
CSW sent e-mail to pt friend [name] as requested to [email or web address]. Provided information and digital copies w/ the resources I had provided pt yesterday during his appointment. These include: [place]211, Medicaid application, disability application access, CAFA ([facility] Financial Aid Application) and 4506-t. I included my information which pt also had been provided yesterday should any additional questions arise.   Octavio Graves, MSW, LCSW [facility]Vascular Care Navigation  [phone]

## 2020-02-27 ENCOUNTER — Ambulatory Visit (INDEPENDENT_AMBULATORY_CARE_PROVIDER_SITE_OTHER): Payer: Self-pay | Admitting: Primary Care

## 2020-02-27 ENCOUNTER — Other Ambulatory Visit (INDEPENDENT_AMBULATORY_CARE_PROVIDER_SITE_OTHER): Payer: Self-pay | Admitting: Primary Care

## 2020-02-27 ENCOUNTER — Other Ambulatory Visit: Payer: Self-pay

## 2020-02-27 ENCOUNTER — Encounter (INDEPENDENT_AMBULATORY_CARE_PROVIDER_SITE_OTHER): Payer: Self-pay | Admitting: Primary Care

## 2020-02-27 VITALS — BP 134/89 | HR 90 | Temp 97.3°F | Ht 69.0 in | Wt 195.2 lb

## 2020-02-27 DIAGNOSIS — F411 Generalized anxiety disorder: Secondary | ICD-10-CM

## 2020-02-27 DIAGNOSIS — E1165 Type 2 diabetes mellitus with hyperglycemia: Secondary | ICD-10-CM

## 2020-02-27 DIAGNOSIS — I25119 Atherosclerotic heart disease of native coronary artery with unspecified angina pectoris: Secondary | ICD-10-CM

## 2020-02-27 DIAGNOSIS — Z720 Tobacco use: Secondary | ICD-10-CM

## 2020-02-27 DIAGNOSIS — Z760 Encounter for issue of repeat prescription: Secondary | ICD-10-CM

## 2020-02-27 MED ORDER — METFORMIN HCL 1000 MG PO TABS: 1000.0000 mg | ORAL_TABLET | Freq: Two times a day (BID) | ORAL | 1 refills | Status: DC

## 2020-02-27 MED ORDER — FLUOXETINE HCL 20 MG PO CAPS: 20.0000 mg | ORAL_CAPSULE | Freq: Every day | ORAL | 1 refills | Status: DC

## 2020-02-27 MED ORDER — SERTRALINE HCL 25 MG PO TABS: 25.0000 mg | ORAL_TABLET | Freq: Every day | ORAL | 1 refills | Status: DC

## 2020-02-27 MED ORDER — INSULIN GLARGINE 100 UNIT/ML SOLOSTAR PEN: 25.0000 [IU] | PEN_INJECTOR | Freq: Two times a day (BID) | SUBCUTANEOUS | 6 refills | Status: DC

## 2020-02-27 MED FILL — FLUoxetine HCL 20 MG CAPS: 20 | 30 days supply | Qty: 30 | Fill #0

## 2020-02-27 MED FILL — SERTRALINE HCL 25 MG TABLET: 25 | 30 days supply | Qty: 30 | Fill #0

## 2020-02-27 NOTE — Progress Notes (Signed)
Established Patient Office Visit  Subjective:  Patient ID: Larry Lewis, male    DOB: 02-03-1966  Age: 54 y.o. MRN: [id number]  CC:  Chief Complaint  Patient presents with  . Anxiety    HPI Mr.Larry Lewis is a 54 year  old Arabic male his son is on standby for interpretation [name]) . Patient speaks and understand some English. presents for follow up for anxiety and depression , however seen Dr. Tamala Fothergill on 12/6//2021 and prescribed medication until send by PCP. He was out of medication due [place] Ill parents they both passed in September 4 hours apart from a CVA.  Past Medical History:  Diagnosis Date  . AMI (acute myocardial infarction) (HCC)   . Anxiety   . Asthma   . Coronary artery disease   . High cholesterol   . Hypertension     Past Surgical History:  Procedure Laterality Date  . CARDIAC CATHETERIZATION    . RIGHT/LEFT HEART CATH AND CORONARY ANGIOGRAPHY N/A 07/09/2019   Procedure: RIGHT/LEFT HEART CATH AND CORONARY ANGIOGRAPHY;  Surgeon: Lennette Bihari, MD;  [facility]: [facility];  Service: Cardiovascular;  Laterality: N/A;    No family history on file.  Social History   Socioeconomic History  . Marital status: Single    Spouse name: Not on file  . Number of children: Not on file  . Years of education: Not on file  . Highest education level: Not on file  Occupational History  . Not on file  Tobacco Use  . Smoking status: Current Every Day Smoker    Packs/day: 1.00    Types: Cigarettes  . Smokeless tobacco: Never Used  Vaping Use  . Vaping Use: Never used  Substance and Sexual Activity  . Alcohol use: Never  . Drug use: Never  . Sexual activity: Not on file  Other Topics Concern  . Not on file  Social History Narrative  . Not on file   Social Determinants of Health   Financial Resource Strain: High Risk  . Difficulty of Paying Living Expenses: Hard  Food Insecurity: No Food Insecurity  . Worried About  Programme researcher, broadcasting/film/video in the Last Year: Never true  . Ran Out of Food in the Last Year: Never true  Transportation Needs: No Transportation Needs  . Lack of Transportation (Medical): No  . Lack of Transportation (Non-Medical): No  Physical Activity: Not on file  Stress: Not on file  Social Connections: Not on file  Intimate Partner Violence: Not on file    Outpatient Medications Prior to Visit  Medication Sig Dispense Refill  . aspirin EC 81 MG tablet Take 81 mg by mouth daily. Swallow whole.    Marland Kitchen atorvastatin (LIPITOR) 80 MG tablet Take 1 tablet (80 mg total) by mouth daily. 30 tablet 5  . clopidogrel (PLAVIX) 75 MG tablet Take 1 tablet (75 mg total) by mouth daily. 30 tablet 5  . furosemide (LASIX) 20 MG tablet Take 1 tablet (20 mg total) by mouth daily. 30 tablet 5  . insulin aspart (NOVOLOG) 100 UNIT/ML FlexPen Inject 8 Units into the skin 3 (three) times daily with meals. Defer refills to PCP 15 mL 0  . losartan (COZAAR) 25 MG tablet Take 1 tablet (25 mg total) by mouth daily. 30 tablet 5  . metoprolol succinate (TOPROL-XL) 25 MG 24 hr tablet Take 1 tablet (25 mg total) by mouth daily. 30 tablet 5  . nitroGLYCERIN (NITROSTAT) 0.4 MG  SL tablet Place 1 tablet (0.4 mg total) under the tongue every 5 (five) minutes x 3 doses as needed for chest pain. 25 tablet 3  . insulin glargine (LANTUS) 100 UNIT/ML Solostar Pen Inject 25 Units into the skin 2 (two) times daily. Defer refills to PCP 15 mL 0  . metFORMIN (GLUCOPHAGE) 1000 MG tablet Take 1 tablet (1,000 mg total) by mouth 2 (two) times daily with a meal. Defer refills to PCP 60 tablet 0   No facility-administered medications prior to visit.    Allergies  Allergen Reactions  . Penicillins Itching    ROS Review of Systems  Psychiatric/Behavioral: Positive for agitation and sleep disturbance.  All other systems reviewed and are negative.     Objective:    Physical Exam Vitals reviewed.  Constitutional:      Appearance: Normal  appearance.  HENT:     Head: Normocephalic.     Right Ear: Tympanic membrane normal.     Left Ear: Tympanic membrane normal.     Nose: Nose normal.  Cardiovascular:     Rate and Rhythm: Normal rate and regular rhythm.  Pulmonary:     Effort: Pulmonary effort is normal.     Breath sounds: Normal breath sounds.  Musculoskeletal:        General: Normal range of motion.     Cervical back: Normal range of motion and neck supple.  Skin:    General: Skin is warm and dry.  Neurological:     Mental Status: He is alert and oriented to person, place, and time.     BP 134/89 (BP Location: Right Arm, Patient Position: Sitting, Cuff Size: Large)   Pulse 90   Temp (!) 97.3 F (36.3 C) (Temporal)   Ht 5\' 9"  (1.753 m)   Wt 195 lb 3.2 oz (88.5 kg)   SpO2 97%   BMI 28.83 kg/m  Wt Readings from Last 3 Encounters:  02/27/20 195 lb 3.2 oz (88.5 kg)  02/24/20 192 lb (87.1 kg)  08/01/19 187 lb 3.2 oz (84.9 kg)     Health Maintenance Due  Topic Date Due  . Hepatitis C Screening  Never done  . PNEUMOCOCCAL POLYSACCHARIDE VACCINE AGE 45-64 HIGH RISK  Never done  . FOOT EXAM  Never done  . OPHTHALMOLOGY EXAM  Never done  . COVID-19 Vaccine (1) Never done  . COLONOSCOPY  Never done    There are no preventive care reminders to display for this patient.  Lab Results  Component Value Date   TSH 1.070 02/24/2020   Lab Results  Component Value Date   WBC 9.1 02/24/2020   HGB 15.0 02/24/2020   HCT 47.5 02/24/2020   MCV 78 (L) 02/24/2020   PLT 304 02/24/2020   Lab Results  Component Value Date   NA 135 02/24/2020   K 4.6 02/24/2020   CO2 24 02/24/2020   GLUCOSE 208 (H) 02/24/2020   BUN 9 02/24/2020   CREATININE 0.89 02/24/2020   BILITOT 0.2 02/24/2020   ALKPHOS 55 02/24/2020   AST 16 02/24/2020   ALT 27 02/24/2020   PROT 7.7 02/24/2020   ALBUMIN 4.8 02/24/2020   CALCIUM 9.9 02/24/2020   ANIONGAP 9 07/11/2019   Lab Results  Component Value Date   CHOL 280 (H) 02/24/2020    Lab Results  Component Value Date   HDL 33 (L) 02/24/2020   Lab Results  Component Value Date   LDLCALC 190 (H) 02/24/2020   Lab Results  Component Value Date  TRIG 287 (H) 02/24/2020   Lab Results  Component Value Date   CHOLHDL 8.5 (H) 02/24/2020   Lab Results  Component Value Date   HGBA1C 8.8 (H) 02/24/2020      Assessment & Plan:  Brylon was seen today for anxiety.  Diagnoses and all orders for this visit:  Generalized anxiety disorder Prozac has a drug drug interaction with metoprolol consulted with his cardiologist suggested Zoloft.  Called patient to inform there is a change in medication but f/u will remain the same.    Tobacco abuse He is aware this is causing increase complications with his heart. Also, has a increased risk for lung cancer and other respiratory diseases recommend cessation.  This will be reminded at each clinical visit.  Uncontrolled type 2 diabetes mellitus with hyperglycemia (HCC)  Goal of therapy is 6.5 hemoglobin . Remarkably A1C has improve despite not being on diabetic medication [place]. Decrease foods that are high in carbohydrates are the following rice, potatoes, breads, sugars, and pastas.  Reduction in the intake (eating) will assist in lowering your blood sugars. -     metFORMIN (GLUCOPHAGE) 1000 MG tablet; Take 1 tablet (1,000 mg total) by mouth 2 (two) times daily with a meal. Defer refills to PCP -     insulin glargine (LANTUS) 100 UNIT/ML Solostar Pen; Inject 25 Units into the skin 2 (two) times daily. Defer refills to PCP  Coronary artery disease involving native coronary artery of native heart with angina pectoris (HCC) Followed by cardiology   Medication refill -     metFORMIN (GLUCOPHAGE) 1000 MG tablet; Take 1 tablet (1,000 mg total) by mouth 2 (two) times daily with a meal. Defer refills to PCP Zoloft 25mg  at bedtime  -     insulin glargine (LANTUS) 100 UNIT/ML Solostar Pen; Inject 25 Units into the  skin 2 (two) times daily. Defer refills to PCP    Meds ordered this encounter  Medications  . metFORMIN (GLUCOPHAGE) 1000 MG tablet    Sig: Take 1 tablet (1,000 mg total) by mouth 2 (two) times daily with a meal. Defer refills to PCP    Dispense:  180 tablet    Refill:  1  . DISCONTD: FLUoxetine (PROZAC) 20 MG capsule    Sig: Take 1 capsule (20 mg total) by mouth daily.    Dispense:  90 capsule    Refill:  1  . insulin glargine (LANTUS) 100 UNIT/ML Solostar Pen    Sig: Inject 25 Units into the skin 2 (two) times daily. Defer refills to PCP    Dispense:  15 mL    Refill:  6  . sertraline (ZOLOFT) 25 MG tablet    Sig: Take 1 tablet (25 mg total) by mouth daily.    Dispense:  90 tablet    Refill:  1    Follow-up: Return in about 7 weeks (around 04/16/2020).    04/18/2020, NP

## 2020-03-02 ENCOUNTER — Telehealth: Payer: Self-pay | Admitting: Licensed Clinical Social Worker

## 2020-03-02 NOTE — Telephone Encounter (Signed)
CSW called pt friend [name], at [phone], who is assisting pt w/ assistance applications and additional support. Re-introduced self, role, reason for call. Pt was able to be seen again by PCP at [facility] last week and the plan is for pt and pt friend to sit down and start the assistance applications today. Pt friend provided w/ information for [facility] Counselor, Mikle Bosworth. who's team assists patients at community clinics.   [name] also confirmed that he received my e-mail with additional community resources. He has my name, number and e-mail should any additional needs be identified that they require assistance w/ at this time.   Octavio Graves, MSW, LCSW [facility]ascular Care Navigation  [phone]

## 2020-03-04 ENCOUNTER — Ambulatory Visit (HOSPITAL_COMMUNITY)
Admission: RE | Admit: 2020-03-04 | Discharge: 2020-03-04 | Disposition: A | Discharge status: Home / Self Care | Payer: Medicaid Other | Source: Ambulatory Visit | Attending: Cardiology | Admitting: Cardiology

## 2020-03-04 ENCOUNTER — Other Ambulatory Visit: Payer: Self-pay | Admitting: Cardiology

## 2020-03-04 ENCOUNTER — Other Ambulatory Visit: Payer: Self-pay

## 2020-03-04 DIAGNOSIS — M79604 Pain in right leg: Secondary | ICD-10-CM

## 2020-03-04 DIAGNOSIS — M79605 Pain in left leg: Secondary | ICD-10-CM

## 2020-03-04 DIAGNOSIS — R200 Anesthesia of skin: Secondary | ICD-10-CM

## 2020-03-07 NOTE — Progress Notes (Signed)
Patient ID: [name]                 DOB: 02/07/1966                      MRN: [id number]     HPI: Larry Lewis is a 54 y.o. male referred by Dr. Bjorn Pippin to pharmacy clinic. PMH is significant for hx of severe multivessel CAD, combined systolic and diastolic heart failure, T2DM, HTN, HLD, asthma, and tobacco use. TTE on 07/06/2019 showed LVEF 35 to 40%, normal RV function, possible bicuspid aortic valve (no AI or AS).  He was seen by Dr. Bjorn Pippin on 02/24/20 for follow-up. He had been off all medications for 1 month because he had to return to [place] for 6 months as both his parents were ill. He reported heaviness in chest with exertion with CP occurring 2-3 days weekly that resolved with nitroglycerin. He was restarted on losartan and metoprolol succinate with plans to restart spironolactone at next visit pending toleration of medications. Weight was up 17 lbs so furosemide was also restarted.  He presents today with his friend, [name], for follow-up in pharmacy clinic. He reports that he is tolerating losartan, metoprolol succinate, and furosemide. He feels SOB especially when walking up the stairs, but this unchanged since his last visit. He did not feel SOB in May when he was taking all of his HF medications. He is unable to lay flat on his back due to orthopnea, so sleeps on his side using 1 pillow. Denies LEE. Reports some lightheadedness/dizziness when standing up too quickly but states this goes away quickly. He does not measure his BP at home but is following a low-sodium diet. He is in the process of applying for Medicaid.  Current HTN meds: losartan 25 mg daily, metoprolol succinate 25 mg daily, furosemide 20 mg daily  Previously tried: spironolactone 25 mg daily (ran out of medications), metoprolol succinate 50-100 mg daily, Entresto 24/26 mg BID BP goal: <130/80 mmHg  Family History: uncontrolled DM in father  Social History: Current smoker (4-5 cigarettes/day)  Diet: Enjoys eating  sweets. Does not add salt to food. Enjoys chicken and fish.  Exercise: Limited given his SOB and multiple hernias  Labs:  02/24/20: Scr 0.89, K 4.6, Na 135, TC 280, TG 287, HDL 33, LDL 190, A1c 8.8% (no medications)  Wt Readings from Last 3 Encounters:  02/27/20 195 lb 3.2 oz (88.5 kg)  02/24/20 192 lb (87.1 kg)  08/01/19 187 lb 3.2 oz (84.9 kg)   BP Readings from Last 3 Encounters:  02/27/20 134/89  02/24/20 134/80  08/01/19 110/73   Pulse Readings from Last 3 Encounters:  02/27/20 90  02/24/20 96  08/01/19 (!) 107    Renal function: Estimated Creatinine Clearance: 103.2 mL/min (by C-G formula based on SCr of 0.89 mg/dL).  Past Medical History:  Diagnosis Date  . AMI (acute myocardial infarction) (HCC)   . Anxiety   . Asthma   . Coronary artery disease   . High cholesterol   . Hypertension     Current Outpatient Medications on File Prior to Visit  Medication Sig Dispense Refill  . aspirin EC 81 MG tablet Take 81 mg by mouth daily. Swallow whole.    Marland Kitchen atorvastatin (LIPITOR) 80 MG tablet Take 1 tablet (80 mg total) by mouth daily. 30 tablet 5  . clopidogrel (PLAVIX) 75 MG tablet Take 1 tablet (75 mg total) by mouth daily. 30 tablet 5  . furosemide (  LASIX) 20 MG tablet Take 1 tablet (20 mg total) by mouth daily. 30 tablet 5  . insulin aspart (NOVOLOG) 100 UNIT/ML FlexPen Inject 8 Units into the skin 3 (three) times daily with meals. Defer refills to PCP 15 mL 0  . insulin glargine (LANTUS) 100 UNIT/ML Solostar Pen Inject 25 Units into the skin 2 (two) times daily. Defer refills to PCP 15 mL 6  . losartan (COZAAR) 25 MG tablet Take 1 tablet (25 mg total) by mouth daily. 30 tablet 5  . metFORMIN (GLUCOPHAGE) 1000 MG tablet Take 1 tablet (1,000 mg total) by mouth 2 (two) times daily with a meal. Defer refills to PCP 180 tablet 1  . metoprolol succinate (TOPROL-XL) 25 MG 24 hr tablet Take 1 tablet (25 mg total) by mouth daily. 30 tablet 5  . nitroGLYCERIN (NITROSTAT) 0.4 MG SL  tablet Place 1 tablet (0.4 mg total) under the tongue every 5 (five) minutes x 3 doses as needed for chest pain. 25 tablet 3  . sertraline (ZOLOFT) 25 MG tablet Take 1 tablet (25 mg total) by mouth daily. 90 tablet 1   No current facility-administered medications on file prior to visit.    Allergies  Allergen Reactions  . Penicillins Itching     Assessment/Plan:  1. Chronic combined systolic and diastolic congestive heart failure - BP elevated and above goal of <130/80 mmHg. He is euvolemic on exam today and weight is down 2 lbs since last office visit. Start spironolactone 12.5 mg daily and increase metoprolol succinate to 50 mg daily. Continue losartan 25 mg daily and furosemide 20 mg daily. At next visit, consider increasing losartan to 50 mg daily or restarting Entresto if Medicaid application is approved. He is also a good candidate for SGLT2 given concomitant T2DM. Encouraged him to follow cardiac healthy, low-sodium diet and to exercise as he is able to do so. Check BMET today and in 1 week at follow-up visit on 03/17/20.  Tama Headings, PharmD, BCPS PGY2 Cardiology Pharmacy Resident

## 2020-03-09 ENCOUNTER — Ambulatory Visit (INDEPENDENT_AMBULATORY_CARE_PROVIDER_SITE_OTHER): Payer: Self-pay | Admitting: Pharmacist

## 2020-03-09 ENCOUNTER — Other Ambulatory Visit: Payer: Self-pay | Admitting: Cardiology

## 2020-03-09 ENCOUNTER — Other Ambulatory Visit: Payer: Self-pay

## 2020-03-09 DIAGNOSIS — I5043 Acute on chronic combined systolic (congestive) and diastolic (congestive) heart failure: Secondary | ICD-10-CM

## 2020-03-09 DIAGNOSIS — I10 Essential (primary) hypertension: Secondary | ICD-10-CM

## 2020-03-09 DIAGNOSIS — Z79899 Other long term (current) drug therapy: Secondary | ICD-10-CM

## 2020-03-09 MED ORDER — METOPROLOL SUCCINATE ER 50 MG PO TB24: 50.0000 mg | ORAL_TABLET | Freq: Every day | ORAL | 11 refills | Status: DC

## 2020-03-09 MED ORDER — SPIRONOLACTONE 25 MG PO TABS: 12.5000 mg | ORAL_TABLET | Freq: Every day | ORAL | 11 refills | Status: DC

## 2020-03-09 MED FILL — SPIRONOLACTONE 25 MG TABLET: 25 | 30 days supply | Qty: 15 | Fill #0

## 2020-03-09 MED FILL — METOPROLOL SUCCINATE ER 50: 50 | 30 days supply | Qty: 30 | Fill #0

## 2020-03-09 NOTE — Patient Instructions (Addendum)
It was nice to meet you today!   Your blood pressure goal is less than 130/31mmHg   START taking spironolactone 12.5 mg (HALF-tablet) daily  INCREASE your metoprolol succinate to 50 mg daily. You may take 2 tablets of your 25 mg tablets until your supply runs out.   Continue taking your losartan 25 mg daily and furosemide 20 mg daily.    We will give you a call if your lab results are abnormal. Otherwise, no news is good news.  Please call us at [phone] if you have any questions.

## 2020-03-10 ENCOUNTER — Ambulatory Visit: Payer: Self-pay | Attending: Family Medicine

## 2020-03-10 LAB — BASIC METABOLIC PANEL
BUN/Creatinine Ratio: 13 (ref 9–20)
BUN: 11 mg/dL (ref 6–24)
CO2: 27 mmol/L (ref 20–29)
Calcium: 10.1 mg/dL (ref 8.7–10.2)
Chloride: 99 mmol/L (ref 96–106)
Creatinine, Ser: 0.86 mg/dL (ref 0.76–1.27)
GFR calc Af Amer: 113 mL/min/{1.73_m2} (ref >59)
GFR calc non Af Amer: 98 mL/min/{1.73_m2} (ref >59)
Glucose: 117 mg/dL — ABNORMAL HIGH (ref 65–99)
Potassium: 3.6 mmol/L (ref 3.5–5.2)
Sodium: 140 mmol/L (ref 134–144)

## 2020-03-10 LAB — MAGNESIUM: Magnesium: 1.9 mg/dL (ref 1.6–2.3)

## 2020-03-16 NOTE — Patient Instructions (Addendum)
Recommend patient report to ER.  Patient signed AMA form.

## 2020-03-17 ENCOUNTER — Other Ambulatory Visit (HOSPITAL_COMMUNITY): Payer: Self-pay

## 2020-03-17 ENCOUNTER — Ambulatory Visit (INDEPENDENT_AMBULATORY_CARE_PROVIDER_SITE_OTHER): Payer: Self-pay | Admitting: Pharmacist

## 2020-03-17 ENCOUNTER — Telehealth: Payer: Self-pay | Admitting: Licensed Clinical Social Worker

## 2020-03-17 ENCOUNTER — Telehealth: Payer: Self-pay | Admitting: *Deleted

## 2020-03-17 ENCOUNTER — Other Ambulatory Visit: Payer: Self-pay

## 2020-03-17 VITALS — BP 128/66 | HR 99 | Wt 192.2 lb

## 2020-03-17 DIAGNOSIS — I5043 Acute on chronic combined systolic (congestive) and diastolic (congestive) heart failure: Secondary | ICD-10-CM

## 2020-03-17 DIAGNOSIS — I25119 Atherosclerotic heart disease of native coronary artery with unspecified angina pectoris: Secondary | ICD-10-CM

## 2020-03-17 NOTE — Telephone Encounter (Signed)
CSW reached out to [place], Artist, to see if any additional assistance w/ application needed for pt. Per Mikle Bosworth pt did not bring needed documents to submit application at the time of his appointment. CSW sent application and document list again to Thayer Ohm, PharmD, who will be meeting with pt again today. Also included PAP for Entresto as per Wynona Canes, PharmD, previous note " restarting Entresto if Medicaid application is approved". It can take 60-90+ days for Medicaid to process application determination and it may be best to complete PAP while waiting to ensure pt has needed medication if recommended.   Larry Lewis, MSW, LCSW [facility] Heart/Vascular Care Navigation  [phone]

## 2020-03-17 NOTE — Progress Notes (Signed)
Patient ID: Larry Lewis                 DOB: 1965-11-07                      MRN: [id number]     HPI: Larry Lewis is a 54 y.o. male referred by Dr. Bjorn Pippin for CHF management. PMH is significant for CHF, CAD, DM, HTN, HLD, asthma, and tobacco use.  TTE on 07/06/2019 showed LVEF 35 to 40%, normal RV function, possible bicuspid aortic valve (no AI or AS).  He was seen by Dr. Bjorn Pippin on 02/24/20 for follow-up. He had been off all medications for 1 month because he had to return to [place] for 6 months as both his parents were ill. He reported heaviness in chest with exertion with CP occurring 2-3 days weekly that resolved with nitroglycerin. He was restarted on losartan and metoprolol succinate with plans to restart spironolactone at next visit pending toleration of medications. Weight was up 17 lbs so furosemide was also restarted.  He presents today with his friend, Larry Lewis, for follow-up in pharmacy clinic.  At last visit metoprolol was increased to 50mg  daily and spironolactone 12.5mg  was started.   Complains of chest pain that is near constant and sometimes worse than others.  Larry Lewis reports they went to [place] a few days ago and patient could not walk around store.  Has near constant SOB.  Reports pain in his upper GI that he believes is indigestion.  Complains of pain on his left side from his head down to his left hand.  Does not like to take nitroglycerin because it gives him headaches.  Reports medication compliance.  Blood sugar this morning was >241.  Does not take BP at home.  Current HTN meds: losartan 25mg  daily, metoprolol succinte 50 mg daily, furosemide 20 mg daily, spironolactone 12.5mg  daily,  BP goal: <130/80  Home BP readings: Does not take BP at home.  Wt Readings from Last 3 Encounters:  03/09/20 193 lb 9.6 oz (87.8 kg)  02/27/20 195 lb 3.2 oz (88.5 kg)  02/24/20 192 lb (87.1 kg)   BP Readings from Last 3 Encounters:  03/09/20 138/90  02/27/20 134/89  02/24/20  134/80   Pulse Readings from Last 3 Encounters:  03/09/20 98  02/27/20 90  02/24/20 96    Renal function: Estimated Creatinine Clearance: 106.4 mL/min (by C-G formula based on SCr of 0.86 mg/dL).  Past Medical History:  Diagnosis Date  . AMI (acute myocardial infarction) (HCC)   . Anxiety   . Asthma   . Coronary artery disease   . High cholesterol   . Hypertension     Current Outpatient Medications on File Prior to Visit  Medication Sig Dispense Refill  . aspirin EC 81 MG tablet Take 81 mg by mouth daily. Swallow whole.    14/09/21 atorvastatin (LIPITOR) 80 MG tablet Take 1 tablet (80 mg total) by mouth daily. 30 tablet 5  . clopidogrel (PLAVIX) 75 MG tablet Take 1 tablet (75 mg total) by mouth daily. 30 tablet 5  . furosemide (LASIX) 20 MG tablet Take 1 tablet (20 mg total) by mouth daily. 30 tablet 5  . insulin aspart (NOVOLOG) 100 UNIT/ML FlexPen Inject 8 Units into the skin 3 (three) times daily with meals. Defer refills to PCP 15 mL 0  . insulin glargine (LANTUS) 100 UNIT/ML Solostar Pen Inject 25 Units into the skin 2 (two) times daily. Defer refills to PCP 15 mL  6  . losartan (COZAAR) 25 MG tablet Take 1 tablet (25 mg total) by mouth daily. 30 tablet 5  . metFORMIN (GLUCOPHAGE) 1000 MG tablet Take 1 tablet (1,000 mg total) by mouth 2 (two) times daily with a meal. Defer refills to PCP 180 tablet 1  . metoprolol succinate (TOPROL-XL) 50 MG 24 hr tablet Take 1 tablet (50 mg total) by mouth daily. 30 tablet 11  . nitroGLYCERIN (NITROSTAT) 0.4 MG SL tablet Place 1 tablet (0.4 mg total) under the tongue every 5 (five) minutes x 3 doses as needed for chest pain. 25 tablet 3  . sertraline (ZOLOFT) 25 MG tablet Take 1 tablet (25 mg total) by mouth daily. 90 tablet 1  . spironolactone (ALDACTONE) 25 MG tablet Take 0.5 tablets (12.5 mg total) by mouth daily. 15 tablet 11   No current facility-administered medications on file prior to visit.    Allergies  Allergen Reactions  .  Penicillins Itching     Assessment/Plan:  1. CHF - Patient blood pressure in room 128/66 which is at goal of <130/80.  However, concern regarding patients chest pain, SOB, and left sided pain.  Since patient has history of MI and severe CAD, discussed with triage and Dr. Ladona Ridgel who is DOD. Dr Ladona Ridgel recommended patient go to ER for evaluation.  Went back into room with Sheppard Evens RN and explained Dr. Lubertha Basque recommendation.  Patient not willing to go to ER.  Explained to patient why it was recommended since he has multiple blockages in his coronary arteries and his symptoms suggest that some blockages may be worsening.  Increasing his BP medications will not help this.    Patient resistant and signed AMA form.    Laural Golden, PharmD, BCACP, CDCES, CPP [facility] [address] [place], [place] [address] Phone: [phone]; Fax: [phone] 03/17/2020 5:12 PM

## 2020-03-17 NOTE — Telephone Encounter (Signed)
Patient came in for a visit with the pharmacist, Thayer Ohm. Thayer Ohm came to triage with the patient complaining of constant chest pain for 2 days radiating down his left arm to his hand, shortness of breath, unable to walk around [place] a few days ago related to SOB. Patient has not taken nitro because it causes him to have a headache. Patient describes the pain as "little pressure,".    Advised patient that he needs to go to the ER for evaluation. Patient refused to go to the ER and refused transporting to the ER.  Patient signed AMA form in office.

## 2020-03-19 ENCOUNTER — Telehealth: Payer: Self-pay | Admitting: Licensed Clinical Social Worker

## 2020-03-19 ENCOUNTER — Encounter: Payer: Self-pay | Admitting: *Deleted

## 2020-03-19 NOTE — Telephone Encounter (Signed)
CSW received confirmation back from DSS Caseworker Wyona Almas that information was received and "I will add to the client's case file for him to keep receiving SNAP."  Dr. Bjorn Pippin and RN [facility] made aware.  Larry Lewis, MSW, LCSW [facility]/Vascular Care Navigation  [phone]

## 2020-03-19 NOTE — Telephone Encounter (Signed)
CSW was able to make contact with main DSS line [phone]) and pt worker Wyona Almas [phone]). Made her aware that I was assisting pt and would be sending the work non participation letter from Dr. Campbell Lerner team. Letter completed and faxed securely to [phone] (confirmation sheet received) and sent securely to Ms. Padilla's email at [email or web address].   Made Ziere and Mohammed aware and that they should let me know if any additional needs arise. CSW made pt aware that the pharmacy team had shared that he had declined to go to the hospital during his last visit. He states there was a miscommunication and that he is feeling better. I encouraged him should any other concerns arise that he should be medically evaluated. They both state understanding. CSW and Care Navigation team remains available for pt needs at this time.   Octavio Graves, MSW, LCSW [facility]/Vascular Care Navigation  [phone]

## 2020-03-19 NOTE — Telephone Encounter (Signed)
CSW received a call from pt and pt friend [name] today. They share they received a letter stating pt SNAP (food stamps) would be terminated if they do not send a letter indicating work non participation from pt MD.   CSW reached out to Dr. Bjorn Pippin and Mayme Genta, RN regarding this request and they are willing to complete a letter on the patients behalf. Attempted to reach pt case worker Wyona Almas at [phone] as indicated on the note and was unable to reach her, HIPAA compliant message left. CSW currently reaching out to [facility] DSS on pt behalf to locate fax number for [employer] to submit the letter on patients behalf.   Octavio Graves, MSW, LCSW [facility] Heart/Vascular Care Navigation  [phone]

## 2020-03-24 ENCOUNTER — Emergency Department (HOSPITAL_COMMUNITY): Payer: Medicaid Other

## 2020-03-24 ENCOUNTER — Observation Stay (HOSPITAL_COMMUNITY)
Admission: EM | Admit: 2020-03-24 | Discharge: 2020-03-26 | Disposition: A | Discharge status: Home / Self Care | Payer: Medicaid Other | Attending: Internal Medicine | Admitting: Internal Medicine

## 2020-03-24 ENCOUNTER — Other Ambulatory Visit: Payer: Self-pay

## 2020-03-24 ENCOUNTER — Encounter (HOSPITAL_COMMUNITY): Payer: Self-pay

## 2020-03-24 ENCOUNTER — Observation Stay (HOSPITAL_COMMUNITY): Payer: Medicaid Other

## 2020-03-24 DIAGNOSIS — S42201A Unspecified fracture of upper end of right humerus, initial encounter for closed fracture: Secondary | ICD-10-CM | POA: Diagnosis not present

## 2020-03-24 DIAGNOSIS — I5042 Chronic combined systolic (congestive) and diastolic (congestive) heart failure: Secondary | ICD-10-CM | POA: Insufficient documentation

## 2020-03-24 DIAGNOSIS — Z79899 Other long term (current) drug therapy: Secondary | ICD-10-CM | POA: Insufficient documentation

## 2020-03-24 DIAGNOSIS — Z794 Long term (current) use of insulin: Secondary | ICD-10-CM | POA: Insufficient documentation

## 2020-03-24 DIAGNOSIS — Y9301 Activity, walking, marching and hiking: Secondary | ICD-10-CM | POA: Diagnosis not present

## 2020-03-24 DIAGNOSIS — Z7984 Long term (current) use of oral hypoglycemic drugs: Secondary | ICD-10-CM | POA: Diagnosis not present

## 2020-03-24 DIAGNOSIS — E119 Type 2 diabetes mellitus without complications: Secondary | ICD-10-CM | POA: Diagnosis not present

## 2020-03-24 DIAGNOSIS — Z7901 Long term (current) use of anticoagulants: Secondary | ICD-10-CM | POA: Insufficient documentation

## 2020-03-24 DIAGNOSIS — I110 Hypertensive heart disease with heart failure: Secondary | ICD-10-CM | POA: Insufficient documentation

## 2020-03-24 DIAGNOSIS — R55 Syncope and collapse: Secondary | ICD-10-CM | POA: Diagnosis present

## 2020-03-24 DIAGNOSIS — Z7982 Long term (current) use of aspirin: Secondary | ICD-10-CM | POA: Diagnosis not present

## 2020-03-24 DIAGNOSIS — S4991XA Unspecified injury of right shoulder and upper arm, initial encounter: Secondary | ICD-10-CM | POA: Diagnosis present

## 2020-03-24 DIAGNOSIS — W109XXA Fall (on) (from) unspecified stairs and steps, initial encounter: Secondary | ICD-10-CM | POA: Insufficient documentation

## 2020-03-24 DIAGNOSIS — S42211A Unspecified displaced fracture of surgical neck of right humerus, initial encounter for closed fracture: Secondary | ICD-10-CM

## 2020-03-24 DIAGNOSIS — W19XXXA Unspecified fall, initial encounter: Secondary | ICD-10-CM

## 2020-03-24 DIAGNOSIS — J189 Pneumonia, unspecified organism: Secondary | ICD-10-CM

## 2020-03-24 DIAGNOSIS — I2510 Atherosclerotic heart disease of native coronary artery without angina pectoris: Secondary | ICD-10-CM | POA: Diagnosis not present

## 2020-03-24 DIAGNOSIS — J45909 Unspecified asthma, uncomplicated: Secondary | ICD-10-CM | POA: Diagnosis not present

## 2020-03-24 DIAGNOSIS — E1165 Type 2 diabetes mellitus with hyperglycemia: Secondary | ICD-10-CM

## 2020-03-24 DIAGNOSIS — F17210 Nicotine dependence, cigarettes, uncomplicated: Secondary | ICD-10-CM | POA: Insufficient documentation

## 2020-03-24 DIAGNOSIS — Y92009 Unspecified place in unspecified non-institutional (private) residence as the place of occurrence of the external cause: Secondary | ICD-10-CM

## 2020-03-24 DIAGNOSIS — Z20822 Contact with and (suspected) exposure to covid-19: Secondary | ICD-10-CM | POA: Diagnosis not present

## 2020-03-24 HISTORY — DX: Unspecified place in unspecified non-institutional (private) residence as the place of occurrence of the external cause: Y92.009

## 2020-03-24 HISTORY — DX: Unspecified fall, initial encounter: W19.XXXA

## 2020-03-24 LAB — CBC WITH DIFFERENTIAL/PLATELET
Abs Immature Granulocytes: 0.17 10*3/uL — ABNORMAL HIGH (ref 0.00–0.07)
Basophils Absolute: 0.2 10*3/uL — ABNORMAL HIGH (ref 0.0–0.1)
Basophils Relative: 1 %
Eosinophils Absolute: 0.1 10*3/uL (ref 0.0–0.5)
Eosinophils Relative: 1 %
HCT: 41.7 % (ref 39.0–52.0)
Hemoglobin: 13.7 g/dL (ref 13.0–17.0)
Immature Granulocytes: 1 %
Lymphocytes Relative: 17 %
Lymphs Abs: 3.1 10*3/uL (ref 0.7–4.0)
MCH: 25.4 pg — ABNORMAL LOW (ref 26.0–34.0)
MCHC: 32.9 g/dL (ref 30.0–36.0)
MCV: 77.4 fL — ABNORMAL LOW (ref 80.0–100.0)
Monocytes Absolute: 1.2 10*3/uL — ABNORMAL HIGH (ref 0.1–1.0)
Monocytes Relative: 7 %
Neutro Abs: 13.7 10*3/uL — ABNORMAL HIGH (ref 1.7–7.7)
Neutrophils Relative %: 73 %
Platelets: 331 10*3/uL (ref 150–400)
RBC: 5.39 MIL/uL (ref 4.22–5.81)
RDW: 13.6 % (ref 11.5–15.5)
WBC: 18.5 10*3/uL — ABNORMAL HIGH (ref 4.0–10.5)
nRBC: 0 % (ref 0.0–0.2)

## 2020-03-24 LAB — COMPREHENSIVE METABOLIC PANEL
ALT: 83 U/L — ABNORMAL HIGH (ref 0–44)
AST: 68 U/L — ABNORMAL HIGH (ref 15–41)
Albumin: 3.9 g/dL (ref 3.5–5.0)
Alkaline Phosphatase: 45 U/L (ref 38–126)
Anion gap: 12 (ref 5–15)
BUN: 18 mg/dL (ref 6–20)
CO2: 24 mmol/L (ref 22–32)
Calcium: 9.4 mg/dL (ref 8.9–10.3)
Chloride: 102 mmol/L (ref 98–111)
Creatinine, Ser: 0.93 mg/dL (ref 0.61–1.24)
GFR, Estimated: >60 mL/min (ref >60)
Glucose, Bld: 112 mg/dL — ABNORMAL HIGH (ref 70–99)
Potassium: 4.2 mmol/L (ref 3.5–5.1)
Sodium: 138 mmol/L (ref 135–145)
Total Bilirubin: 0.6 mg/dL (ref 0.3–1.2)
Total Protein: 6.9 g/dL (ref 6.5–8.1)

## 2020-03-24 LAB — PROTIME-INR
INR: 1.1 (ref 0.8–1.2)
Prothrombin Time: 13.3 seconds (ref 11.4–15.2)

## 2020-03-24 LAB — CBG MONITORING, ED
Glucose-Capillary: 112 mg/dL — ABNORMAL HIGH (ref 70–99)
Glucose-Capillary: 117 mg/dL — ABNORMAL HIGH (ref 70–99)

## 2020-03-24 LAB — TROPONIN I (HIGH SENSITIVITY): Troponin I (High Sensitivity): 9 ng/L (ref <18)

## 2020-03-24 MED ORDER — MORPHINE SULFATE (PF) 4 MG/ML IV SOLN
4.0000 mg | Freq: Once | INTRAVENOUS | Status: AC
  Administered 2020-03-24: 4 mg via INTRAVENOUS
  Filled 2020-03-24: qty 1

## 2020-03-24 MED ORDER — MORPHINE SULFATE (PF) 4 MG/ML IV SOLN
4.0000 mg | Freq: Once | INTRAVENOUS | Status: AC
Start: 2020-03-24 — End: 2020-03-25
  Administered 2020-03-25: 4 mg via INTRAVENOUS
  Filled 2020-03-24: qty 1

## 2020-03-24 NOTE — ED Triage Notes (Addendum)
Pt arrived via [facility] from home. Pt hasn't eaten all day and is a diabetic. Pt was walking down steps and got dizzy and fell down 10 steps. Pt denies hitting head, denies LOC. Pt is on Plavix. Pt landed on right shoulder and injured it. Per EMS pt right shoulder deformed and swollen.  EMS gave fentanyl 100 mcg an hour asgo

## 2020-03-24 NOTE — ED Provider Notes (Signed)
[facility] EMERGENCY DEPARTMENT Provider Note   CSN: [id number] Arrival date & time: 03/24/20  1731   History Chief Complaint  Patient presents with  . Fall    Larry Lewis is a 55 y.o. male.  55 y.o male with a PMH of MI, Asthma, HTN,  CAD, DM presents to the ED via EMS s/p fall. Patient reports walking down the stairs at a pizza place when he suddenly felt dizzy and fell forwards. Impact was caught by the right side of his body. He reports pain to the area along with pain along the right sided chest. States he had not eaten much today and attributes  this to his fall. He was provided with 100 mics of fentanyl by EMS.  Does have extensive cardiac disease, is currently on Plavix and aspirin daily.  Ports he did not hit his head, no loss of consciousness, no other complaints.   The history is provided by the patient.  Fall This is a new problem. Pertinent negatives include no chest pain, no abdominal pain, no headaches and no shortness of breath.       Past Medical History:  Diagnosis Date  . AMI (acute myocardial infarction) (HCC)   . Anxiety   . Asthma   . Coronary artery disease   . High cholesterol   . Hypertension     Patient Active Problem List   Diagnosis Date Noted  . Syncope 03/24/2020  . Hypertension 03/09/2020  . Coronary artery disease involving native coronary artery of native heart with angina pectoris (HCC)   . Acute exacerbation of CHF (congestive heart failure) (HCC) 07/05/2019  . Uncontrolled type 2 diabetes mellitus with hyperglycemia (HCC) 07/05/2019  . Tobacco abuse 07/05/2019  . Bilateral pleural effusion 07/05/2019  . Acute respiratory failure with hypoxia (HCC) 07/04/2019    Past Surgical History:  Procedure Laterality Date  . CARDIAC CATHETERIZATION    . RIGHT/LEFT HEART CATH AND CORONARY ANGIOGRAPHY N/A 07/09/2019   Procedure: RIGHT/LEFT HEART CATH AND CORONARY ANGIOGRAPHY;  Surgeon: Lennette Bihari, MD;  Location: [facility] INVASIVE  CV LAB;  Service: Cardiovascular;  Laterality: N/A;       History reviewed. No pertinent family history.  Social History   Tobacco Use  . Smoking status: Current Every Day Smoker    Packs/day: 1.00    Types: Cigarettes  . Smokeless tobacco: Never Used  Vaping Use  . Vaping Use: Never used  Substance Use Topics  . Alcohol use: Never  . Drug use: Never    Home Medications Prior to Admission medications   Medication Sig Start Date End Date Taking? Authorizing Provider  aspirin EC 81 MG tablet Take 81 mg by mouth daily. Swallow whole.    [provider]  atorvastatin (LIPITOR) 80 MG tablet Take 1 tablet (80 mg total) by mouth daily. 02/24/20 03/25/20  Little Ishikawa, MD  clopidogrel (PLAVIX) 75 MG tablet Take 1 tablet (75 mg total) by mouth daily. 02/24/20   Little Ishikawa, MD  furosemide (LASIX) 20 MG tablet Take 1 tablet (20 mg total) by mouth daily. 02/24/20 03/25/20  Little Ishikawa, MD  insulin aspart (NOVOLOG) 100 UNIT/ML FlexPen Inject 8 Units into the skin 3 (three) times daily with meals. Defer refills to PCP 02/24/20 03/25/20  Little Ishikawa, MD  insulin glargine (LANTUS) 100 UNIT/ML Solostar Pen Inject 25 Units into the skin 2 (two) times daily. Defer refills to PCP 02/27/20 03/28/20  Grayce Sessions, NP  losartan (COZAAR) 25  MG tablet Take 1 tablet (25 mg total) by mouth daily. 02/24/20 05/24/20  Little Ishikawa, MD  metFORMIN (GLUCOPHAGE) 1000 MG tablet Take 1 tablet (1,000 mg total) by mouth 2 (two) times daily with a meal. Defer refills to PCP 02/27/20   Grayce Sessions, NP  metoprolol succinate (TOPROL-XL) 50 MG 24 hr tablet Take 1 tablet (50 mg total) by mouth daily. 03/09/20   Little Ishikawa, MD  nitroGLYCERIN (NITROSTAT) 0.4 MG SL tablet Place 1 tablet (0.4 mg total) under the tongue every 5 (five) minutes x 3 doses as needed for chest pain. 02/24/20 03/25/20  Little Ishikawa, MD  sertraline (ZOLOFT) 25 MG  tablet Take 1 tablet (25 mg total) by mouth daily. 02/27/20   Grayce Sessions, NP  spironolactone (ALDACTONE) 25 MG tablet Take 0.5 tablets (12.5 mg total) by mouth daily. 03/09/20 03/04/21  Little Ishikawa, MD    Allergies    Penicillins  Review of Systems   Review of Systems  Constitutional: Negative for chills and fever.  Respiratory: Negative for shortness of breath.   Cardiovascular: Negative for chest pain.  Gastrointestinal: Negative for abdominal pain, nausea and vomiting.  Genitourinary: Negative for flank pain.  Musculoskeletal: Positive for arthralgias and myalgias. Negative for back pain.  Neurological: Negative for light-headedness and headaches.  All other systems reviewed and are negative.   Physical Exam Updated Vital Signs BP 121/76   Pulse 100   Temp 98.6 F (37 C) (Oral)   Resp (!) 24   Ht 5\' 9"  (1.753 m)   Wt 88 kg   SpO2 94%   BMI 28.65 kg/m   Physical Exam Vitals and nursing note reviewed.  Constitutional:      Appearance: He is not ill-appearing.  HENT:     Head: Normocephalic and atraumatic.     Mouth/Throat:     Mouth: Mucous membranes are moist.  Pulmonary:     Effort: Pulmonary effort is normal.     Breath sounds: No wheezing.  Abdominal:     General: Abdomen is flat.     Tenderness: There is no abdominal tenderness. There is no right CVA tenderness or left CVA tenderness.  Musculoskeletal:        General: Tenderness present.     Right shoulder: Deformity and tenderness present. No bony tenderness. Decreased range of motion. Normal strength. Normal pulse.     Cervical back: Normal range of motion and neck supple. No rigidity.     Comments: Obvious deformity to the right shoulder. Placed on cloth sling by EMS.   Skin:    General: Skin is warm and dry.  Neurological:     Mental Status: He is alert and oriented to person, place, and time.     ED Results / Procedures / Treatments   Labs (all labs ordered are listed, but  only abnormal results are displayed) Labs Reviewed  CBC WITH DIFFERENTIAL/PLATELET - Abnormal; Notable for the following components:      Result Value   WBC 18.5 (*)    MCV 77.4 (*)    MCH 25.4 (*)    Neutro Abs 13.7 (*)    Monocytes Absolute 1.2 (*)    Basophils Absolute 0.2 (*)    Abs Immature Granulocytes 0.17 (*)    All other components within normal limits  COMPREHENSIVE METABOLIC PANEL - Abnormal; Notable for the following components:   Glucose, Bld 112 (*)    AST 68 (*)    ALT 83 (*)  All other components within normal limits  CBG MONITORING, ED - Abnormal; Notable for the following components:   Glucose-Capillary 112 (*)    All other components within normal limits  CBG MONITORING, ED - Abnormal; Notable for the following components:   Glucose-Capillary 117 (*)    All other components within normal limits  SARS CORONAVIRUS 2 (TAT 6-24 HRS)  PROTIME-INR  BRAIN NATRIURETIC PEPTIDE  TROPONIN I (HIGH SENSITIVITY)    EKG EKG Interpretation  Date/Time:  Tuesday March 24 2020 17:44:15 EST Ventricular Rate:  94 PR Interval:    QRS Duration: 97 QT Interval:  342 QTC Calculation: 428 R Axis:   86 Text Interpretation: Sinus rhythm Probable LVH with secondary repol abnrm ST depr, consider ischemia, inferior leads No significant change since last tracing Confirmed by Deno Etienne [phone]) on 03/24/2020 5:46:31 PM   Radiology DG Ribs Unilateral W/Chest Right  Result Date: 03/24/2020 CLINICAL DATA:  Right rib and shoulder pain EXAM: RIGHT RIBS AND CHEST - 3+ VIEW COMPARISON:  07/04/2019 FINDINGS: Single-view chest demonstrates an acute mildly displaced fracture involving the right humerus neck. Mild cardiomegaly. No focal consolidation or pleural effusion. No pneumothorax. Right rib series demonstrates no acute displaced right rib fracture IMPRESSION: 1. Negative for right displaced rib fracture 2. Acute mildly displaced fracture involving proximal right humerus Electronically  Signed   By: Donavan Foil M.D.   On: 03/24/2020 19:12   DG Shoulder Right  Result Date: 03/24/2020 CLINICAL DATA:  Right shoulder pain EXAM: RIGHT SHOULDER - 2+ VIEW COMPARISON:  None. FINDINGS: AC joint is intact. Acute fracture involving right humeral neck and greater tuberosity with about 1/4 shaft diameter anterior displacement of distal fracture fragment. Mild valgus angulation of distal fracture fragment. The humeral head projects over the glenoid. IMPRESSION: Acute mildly displaced and angulated fracture involving the right humeral neck and greater tuberosity. Electronically Signed   By: Donavan Foil M.D.   On: 03/24/2020 19:13   DG Knee 2 Views Right  Result Date: 03/24/2020 CLINICAL DATA:  Pain EXAM: RIGHT KNEE - 1-2 VIEW COMPARISON:  None. FINDINGS: No evidence of fracture, dislocation, or joint effusion. No evidence of arthropathy or other focal bone abnormality. Soft tissues are unremarkable. IMPRESSION: Negative. Electronically Signed   By: Constance Holster M.D.   On: 03/24/2020 20:47    Procedures Procedures (including critical care time)  Medications Ordered in ED Medications  morphine 4 MG/ML injection 4 mg (has no administration in time range)  morphine 4 MG/ML injection 4 mg (4 mg Intravenous Given 03/24/20 1900)  morphine 4 MG/ML injection 4 mg (4 mg Intravenous Given 03/24/20 2056)    ED Course  I have reviewed the triage vital signs and the nursing notes.  Pertinent labs & imaging results that were available during my care of the patient were reviewed by me and considered in my medical decision making (see chart for details).  Clinical Course as of 03/24/20 2135  Tue Mar 24, 2020  2013 WBC(!): 18.5 [JS]  2013 AST(!): 68 [JS]  2013 ALT(!): 83 [JS]    Clinical Course User Index [JS] Janeece Fitting, PA-C   MDM Rules/Calculators/A&P   Patient with extensive past medical history of cardiac disease currently on Plavix presents to the ED status post syncopal episode.   Patient reports walking down the stairs when he suddenly fell dizzy, then suddenly syncopized.  Patient reports he did not eat anything prior to syncopal episode, he had a CBG checked by EMS which was 100.  Today's  arrival he complains of pain along the right shoulder, obvious deformity noted, he was placed on a sling.  During primary evaluation patient appears uncomfortable, he did receive 100 mcg of fentanyl via EMS.  Blood pressure is stable, will provide him with 4 mg of morphine.  There is pain with palpation along the right rib region, no bruising noted, abdomen is slightly distended but soft and reports no pain with palpation.  Does report right knee pain as well.  No obvious swelling or deformity noted. Interpretation of his labs by me reveal a leukocytosis of 18.5, hemoglobin is within normal limits.  CMP without any electrolyte derangement, LFTs are slightly elevated.  ET and INR are at baseline.  Xray of his right shoulder showed: Acute mildly displaced and angulated fracture involving the right  humeral neck and greater tuberosity.     Consult placed to orthopedic surgery for further recommendations on shoulder fracture.   ECHO 07/06/2019 : Left ventricular ejection fraction, by estimation, is 35 to 40%  Patient was reassessed by me, does report improvement in pain however will need additional pain medication at this time.  He is requesting oral intake.  Vitals remained stable.  We discussed results from his x-ray at this time, he is complaining of right knee pain.  History was obtained once again, he does report episode was syncopal in nature today, he began to feel blood rushing down his body, then began to feel dizzy and syncopized.  There was no loss of consciousness.  He reports not striking his head.  8:33 PM Spoke to Dr. [name] who will evaluate patient while in the hospital.  He will be seen tomorrow morning for further evaluation. Due to patient's unknown syncopal mechanism  will call hospitalist for further admission.  9:00 PM Spoke to Dr. Sedalia Muta, who asked for cardiology consult, troponin, reconsultation to hospitalist service at this time.  Patient remains hemodynamically stable, pain is controlled with morphine at this time.  9:10 PM Spoke to cardiology service who review EKG, and recommended formal consultation from hospitalist. Troponin is pending.   9:35 PM Orthopedic provider at the bedside. Patient admitted for further management.   Blu Lori was evaluated in Emergency Department on 03/24/2020 for the symptoms described in the history of present illness. He was evaluated in the context of the global COVID-19 pandemic, which necessitated consideration that the patient might be at risk for infection with the SARS-CoV-2 virus that causes COVID-19. Institutional protocols and algorithms that pertain to the evaluation of patients at risk for COVID-19 are in a state of rapid change based on information released by regulatory bodies including the CDC and federal and state organizations. These policies and algorithms were followed during the patient's care in the ED.   Portions of this note were generated with Scientist, clinical (histocompatibility and immunogenetics). Dictation errors may occur despite best attempts at proofreading.  Final Clinical Impression(s) / ED Diagnoses Final diagnoses:  Fall, initial encounter  Fx humeral neck, right, closed, initial encounter    Rx / DC Orders ED Discharge Orders    None       Claude Manges, Cordelia Poche 03/24/20 2135    Melene Plan, DO 03/24/20 2140

## 2020-03-24 NOTE — ED Notes (Signed)
Ortho tech made aware of need for sling.

## 2020-03-24 NOTE — Progress Notes (Signed)
Orthopedic Tech Progress Note Patient Details:  Larry Lewis April 09, 1965 [id number]  Ortho Devices Type of Ortho Device: Shoulder immobilizer Ortho Device/Splint Location: Right Upper Extremity Ortho Device/Splint Interventions: Ordered,Application,Adjustment   Post Interventions Patient Tolerated: Poor Instructions Provided: Adjustment of device,Care of device,Poper ambulation with device   [name] 03/24/2020, 9:57 PM

## 2020-03-25 ENCOUNTER — Encounter (HOSPITAL_COMMUNITY): Payer: Self-pay | Admitting: Internal Medicine

## 2020-03-25 ENCOUNTER — Observation Stay (HOSPITAL_COMMUNITY): Payer: Medicaid Other

## 2020-03-25 ENCOUNTER — Observation Stay (HOSPITAL_BASED_OUTPATIENT_CLINIC_OR_DEPARTMENT_OTHER): Payer: Medicaid Other

## 2020-03-25 DIAGNOSIS — I2510 Atherosclerotic heart disease of native coronary artery without angina pectoris: Secondary | ICD-10-CM

## 2020-03-25 DIAGNOSIS — Y92009 Unspecified place in unspecified non-institutional (private) residence as the place of occurrence of the external cause: Secondary | ICD-10-CM

## 2020-03-25 DIAGNOSIS — E1165 Type 2 diabetes mellitus with hyperglycemia: Secondary | ICD-10-CM

## 2020-03-25 DIAGNOSIS — R55 Syncope and collapse: Secondary | ICD-10-CM

## 2020-03-25 DIAGNOSIS — S42291A Other displaced fracture of upper end of right humerus, initial encounter for closed fracture: Secondary | ICD-10-CM

## 2020-03-25 DIAGNOSIS — E785 Hyperlipidemia, unspecified: Secondary | ICD-10-CM

## 2020-03-25 DIAGNOSIS — W19XXXA Unspecified fall, initial encounter: Secondary | ICD-10-CM

## 2020-03-25 DIAGNOSIS — R000 Tachycardia, unspecified: Secondary | ICD-10-CM

## 2020-03-25 DIAGNOSIS — I5042 Chronic combined systolic (congestive) and diastolic (congestive) heart failure: Secondary | ICD-10-CM | POA: Diagnosis present

## 2020-03-25 DIAGNOSIS — Z794 Long term (current) use of insulin: Secondary | ICD-10-CM

## 2020-03-25 DIAGNOSIS — F17210 Nicotine dependence, cigarettes, uncomplicated: Secondary | ICD-10-CM

## 2020-03-25 DIAGNOSIS — S42201A Unspecified fracture of upper end of right humerus, initial encounter for closed fracture: Secondary | ICD-10-CM | POA: Diagnosis present

## 2020-03-25 LAB — CBC WITH DIFFERENTIAL/PLATELET
Abs Immature Granulocytes: 0.11 10*3/uL — ABNORMAL HIGH (ref 0.00–0.07)
Basophils Absolute: 0.1 10*3/uL (ref 0.0–0.1)
Basophils Relative: 1 %
Eosinophils Absolute: 0.1 10*3/uL (ref 0.0–0.5)
Eosinophils Relative: 1 %
HCT: 42.1 % (ref 39.0–52.0)
Hemoglobin: 13.1 g/dL (ref 13.0–17.0)
Immature Granulocytes: 1 %
Lymphocytes Relative: 24 %
Lymphs Abs: 3.3 10*3/uL (ref 0.7–4.0)
MCH: 24.5 pg — ABNORMAL LOW (ref 26.0–34.0)
MCHC: 31.1 g/dL (ref 30.0–36.0)
MCV: 78.8 fL — ABNORMAL LOW (ref 80.0–100.0)
Monocytes Absolute: 1.1 10*3/uL — ABNORMAL HIGH (ref 0.1–1.0)
Monocytes Relative: 8 %
Neutro Abs: 9.3 10*3/uL — ABNORMAL HIGH (ref 1.7–7.7)
Neutrophils Relative %: 65 %
Platelets: 299 10*3/uL (ref 150–400)
RBC: 5.34 MIL/uL (ref 4.22–5.81)
RDW: 13.4 % (ref 11.5–15.5)
WBC: 13.9 10*3/uL — ABNORMAL HIGH (ref 4.0–10.5)
nRBC: 0 % (ref 0.0–0.2)

## 2020-03-25 LAB — URINALYSIS, COMPLETE (UACMP) WITH MICROSCOPIC
Bacteria, UA: NONE SEEN
Bilirubin Urine: NEGATIVE
Glucose, UA: NEGATIVE mg/dL
Hgb urine dipstick: NEGATIVE
Ketones, ur: NEGATIVE mg/dL
Leukocytes,Ua: NEGATIVE
Nitrite: NEGATIVE
Protein, ur: 100 mg/dL — AB
Specific Gravity, Urine: 1.016 (ref 1.005–1.030)
pH: 7 (ref 5.0–8.0)

## 2020-03-25 LAB — COMPREHENSIVE METABOLIC PANEL
ALT: 77 U/L — ABNORMAL HIGH (ref 0–44)
AST: 47 U/L — ABNORMAL HIGH (ref 15–41)
Albumin: 4 g/dL (ref 3.5–5.0)
Alkaline Phosphatase: 49 U/L (ref 38–126)
Anion gap: 13 (ref 5–15)
BUN: 16 mg/dL (ref 6–20)
CO2: 23 mmol/L (ref 22–32)
Calcium: 9.6 mg/dL (ref 8.9–10.3)
Chloride: 103 mmol/L (ref 98–111)
Creatinine, Ser: 0.85 mg/dL (ref 0.61–1.24)
GFR, Estimated: >60 mL/min (ref >60)
Glucose, Bld: 132 mg/dL — ABNORMAL HIGH (ref 70–99)
Potassium: 4 mmol/L (ref 3.5–5.1)
Sodium: 139 mmol/L (ref 135–145)
Total Bilirubin: 0.6 mg/dL (ref 0.3–1.2)
Total Protein: 7.1 g/dL (ref 6.5–8.1)

## 2020-03-25 LAB — CBG MONITORING, ED
Glucose-Capillary: 105 mg/dL — ABNORMAL HIGH (ref 70–99)
Glucose-Capillary: 149 mg/dL — ABNORMAL HIGH (ref 70–99)

## 2020-03-25 LAB — GLUCOSE, CAPILLARY
Glucose-Capillary: 153 mg/dL — ABNORMAL HIGH (ref 70–99)
Glucose-Capillary: 191 mg/dL — ABNORMAL HIGH (ref 70–99)

## 2020-03-25 LAB — VITAMIN D 25 HYDROXY (VIT D DEFICIENCY, FRACTURES): Vit D, 25-Hydroxy: 20.56 ng/mL — ABNORMAL LOW (ref 30–100)

## 2020-03-25 LAB — SARS CORONAVIRUS 2 (TAT 6-24 HRS): SARS Coronavirus 2: NEGATIVE

## 2020-03-25 LAB — ECHOCARDIOGRAM COMPLETE
Area-P 1/2: 7.99 cm2
Calc EF: 41.8 %
Height: 69 in
Single Plane A2C EF: 48.2 %
Single Plane A4C EF: 34 %
Weight: 3104 oz

## 2020-03-25 LAB — HEMOGLOBIN A1C
Hgb A1c MFr Bld: 7.5 % — ABNORMAL HIGH (ref 4.8–5.6)
Mean Plasma Glucose: 168.55 mg/dL

## 2020-03-25 LAB — MAGNESIUM: Magnesium: 1.8 mg/dL (ref 1.7–2.4)

## 2020-03-25 LAB — C-REACTIVE PROTEIN: CRP: 0.6 mg/dL (ref <1.0)

## 2020-03-25 LAB — BRAIN NATRIURETIC PEPTIDE: B Natriuretic Peptide: 67.8 pg/mL (ref 0.0–100.0)

## 2020-03-25 LAB — TROPONIN I (HIGH SENSITIVITY): Troponin I (High Sensitivity): 9 ng/L (ref <18)

## 2020-03-25 MED ORDER — POLYETHYLENE GLYCOL 3350 17 G PO PACK: 17.0000 g | PACK | Freq: Every day | ORAL | Status: DC | PRN

## 2020-03-25 MED ORDER — ONDANSETRON HCL 4 MG PO TABS
4.0000 mg | ORAL_TABLET | Freq: Four times a day (QID) | ORAL | Status: DC | PRN
  Administered 2020-03-25: 4 mg via ORAL
  Filled 2020-03-25: qty 1

## 2020-03-25 MED ORDER — INSULIN GLARGINE 100 UNIT/ML ~~LOC~~ SOLN
25.0000 [IU] | Freq: Two times a day (BID) | SUBCUTANEOUS | Status: DC
  Administered 2020-03-25 – 2020-03-26 (×2): 25 [IU] via SUBCUTANEOUS
  Filled 2020-03-25 (×5): qty 0.25

## 2020-03-25 MED ORDER — ATORVASTATIN CALCIUM 80 MG PO TABS
80.0000 mg | ORAL_TABLET | Freq: Every day | ORAL | Status: DC
  Administered 2020-03-25 – 2020-03-26 (×2): 80 mg via ORAL
  Filled 2020-03-25 (×2): qty 1

## 2020-03-25 MED ORDER — METOPROLOL SUCCINATE ER 50 MG PO TB24
50.0000 mg | ORAL_TABLET | Freq: Every day | ORAL | Status: DC
  Administered 2020-03-25 – 2020-03-26 (×2): 50 mg via ORAL
  Filled 2020-03-25: qty 1
  Filled 2020-03-25: qty 2

## 2020-03-25 MED ORDER — MELATONIN 5 MG PO TABS
10.0000 mg | ORAL_TABLET | Freq: Every evening | ORAL | Status: DC | PRN
  Filled 2020-03-25: qty 2

## 2020-03-25 MED ORDER — CLOPIDOGREL BISULFATE 75 MG PO TABS
75.0000 mg | ORAL_TABLET | Freq: Every day | ORAL | Status: DC
  Administered 2020-03-25 – 2020-03-26 (×2): 75 mg via ORAL
  Filled 2020-03-25 (×2): qty 1

## 2020-03-25 MED ORDER — ONDANSETRON HCL 4 MG/2ML IJ SOLN: 4.0000 mg | Freq: Four times a day (QID) | INTRAMUSCULAR | Status: DC | PRN

## 2020-03-25 MED ORDER — INSULIN ASPART 100 UNIT/ML ~~LOC~~ SOLN
0.0000 [IU] | Freq: Three times a day (TID) | SUBCUTANEOUS | Status: DC
  Administered 2020-03-25: 2 [IU] via SUBCUTANEOUS
  Administered 2020-03-25 – 2020-03-26 (×3): 3 [IU] via SUBCUTANEOUS
  Administered 2020-03-26: 2 [IU] via SUBCUTANEOUS

## 2020-03-25 MED ORDER — MORPHINE SULFATE (PF) 4 MG/ML IV SOLN
4.0000 mg | INTRAVENOUS | Status: DC | PRN
  Administered 2020-03-25 (×2): 4 mg via INTRAVENOUS
  Filled 2020-03-25 (×3): qty 1

## 2020-03-25 MED ORDER — ENOXAPARIN SODIUM 40 MG/0.4ML ~~LOC~~ SOLN
40.0000 mg | Freq: Every day | SUBCUTANEOUS | Status: DC
  Administered 2020-03-25 – 2020-03-26 (×2): 40 mg via SUBCUTANEOUS
  Filled 2020-03-25 (×2): qty 0.4

## 2020-03-25 MED ORDER — OXYCODONE-ACETAMINOPHEN 5-325 MG PO TABS
1.0000 | ORAL_TABLET | ORAL | Status: DC | PRN
  Administered 2020-03-25 – 2020-03-26 (×6): 1 via ORAL
  Filled 2020-03-25 (×6): qty 1

## 2020-03-25 MED ORDER — ACETAMINOPHEN 650 MG RE SUPP: 650.0000 mg | Freq: Four times a day (QID) | RECTAL | Status: DC | PRN

## 2020-03-25 MED ORDER — ASPIRIN EC 81 MG PO TBEC
81.0000 mg | DELAYED_RELEASE_TABLET | Freq: Every day | ORAL | Status: DC
  Administered 2020-03-25 – 2020-03-26 (×2): 81 mg via ORAL
  Filled 2020-03-25 (×2): qty 1

## 2020-03-25 MED ORDER — INSULIN ASPART 100 UNIT/ML ~~LOC~~ SOLN
8.0000 [IU] | Freq: Three times a day (TID) | SUBCUTANEOUS | Status: DC
  Administered 2020-03-25 – 2020-03-26 (×3): 8 [IU] via SUBCUTANEOUS

## 2020-03-25 MED ORDER — ACETAMINOPHEN 325 MG PO TABS: 650.0000 mg | ORAL_TABLET | Freq: Four times a day (QID) | ORAL | Status: DC | PRN

## 2020-03-25 MED ORDER — NITROGLYCERIN 0.4 MG SL SUBL: 0.4000 mg | SUBLINGUAL_TABLET | SUBLINGUAL | Status: DC | PRN

## 2020-03-25 MED ORDER — SERTRALINE HCL 25 MG PO TABS
25.0000 mg | ORAL_TABLET | Freq: Every day | ORAL | Status: DC
  Administered 2020-03-25 – 2020-03-26 (×2): 25 mg via ORAL
  Filled 2020-03-25 (×2): qty 1

## 2020-03-25 MED ORDER — LOSARTAN POTASSIUM 25 MG PO TABS
25.0000 mg | ORAL_TABLET | Freq: Every day | ORAL | Status: DC
Start: 2020-03-25 — End: 2020-03-26
  Administered 2020-03-25 – 2020-03-26 (×2): 25 mg via ORAL
  Filled 2020-03-25 (×2): qty 1

## 2020-03-25 MED ORDER — SODIUM CHLORIDE 0.9% FLUSH
3.0000 mL | Freq: Two times a day (BID) | INTRAVENOUS | Status: DC
  Administered 2020-03-25 – 2020-03-26 (×4): 3 mL via INTRAVENOUS

## 2020-03-25 MED ORDER — LACTATED RINGERS IV SOLN: INTRAVENOUS | Status: DC

## 2020-03-25 NOTE — ED Notes (Signed)
Pt sleeping, son at bedside

## 2020-03-25 NOTE — Consult Note (Signed)
ORTHOPAEDIC CONSULTATION  REQUESTING PHYSICIAN: Cristal Ford, DO  Chief Complaint: Exertional CP with episodic dizziness. S/p fall down stairs with right proximal humerus fracture. HPI: Larry Lewis is a 55 y.o. male who complains of episodic dizziness which started in December after resuming his medications after this trip to [place]. Yesterday (03/24/20) while walking down stairs pt. Had an episode of dizziness and fell down the stairs. He denies LOC. Now he has pain in his right shoulder with movent or palpation.   Imaging shows mildly displaced fracture of the left humerus.  Orthopedics was consulted for evaluation.    Denies history of MI, CVA, DVT, PE.    Past Medical History:  Diagnosis Date  . AMI (acute myocardial infarction) ([place])   . Anxiety   . Asthma   . Coronary artery disease   . High cholesterol   . Hypertension    Past Surgical History:  Procedure Laterality Date  . CARDIAC CATHETERIZATION    . RIGHT/LEFT HEART CATH AND CORONARY ANGIOGRAPHY N/A 07/09/2019   Procedure: RIGHT/LEFT HEART CATH AND CORONARY ANGIOGRAPHY;  Surgeon: Troy Sine, MD;  Location: [facility];  Service: Cardiovascular;  Laterality: N/A;   Social History   Socioeconomic History  . Marital status: Single    Spouse name: Not on file  . Number of children: Not on file  . Years of education: Not on file  . Highest education level: Not on file  Occupational History  . Not on file  Tobacco Use  . Smoking status: Current Every Day Smoker    Packs/day: 1.00    Years: 0.50    Pack years: 0.50    Types: Cigarettes  . Smokeless tobacco: Never Used  Vaping Use  . Vaping Use: Never used  Substance and Sexual Activity  . Alcohol use: Never  . Drug use: Never  . Sexual activity: Not on file  Other Topics Concern  . Not on file  Social History Narrative  . Not on file   Social Determinants of Health   Financial Resource Strain: High Risk  . Difficulty of Paying Living  Expenses: Hard  Food Insecurity: No Food Insecurity  . Worried About Charity fundraiser in the Last Year: Never true  . Ran Out of Food in the Last Year: Never true  Transportation Needs: No Transportation Needs  . Lack of Transportation (Medical): No  . Lack of Transportation (Non-Medical): No  Physical Activity: Not on file  Stress: Not on file  Social Connections: Not on file   Family History  Problem Relation Age of Onset  . Other Neg Hx    Allergies  Allergen Reactions  . Penicillins Itching   Prior to Admission medications   Medication Sig Start Date End Date Taking? Authorizing Provider  clopidogrel (PLAVIX) 75 MG tablet Take 1 tablet (75 mg total) by mouth daily. 02/24/20  Yes Donato Heinz, MD  aspirin EC 81 MG tablet Take 81 mg by mouth daily. Swallow whole.    [provider]  atorvastatin (LIPITOR) 80 MG tablet Take 1 tablet (80 mg total) by mouth daily. 02/24/20 03/25/20  Donato Heinz, MD  furosemide (LASIX) 20 MG tablet Take 1 tablet (20 mg total) by mouth daily. 02/24/20 03/25/20  Donato Heinz, MD  insulin aspart (NOVOLOG) 100 UNIT/ML FlexPen Inject 8 Units into the skin 3 (three) times daily with meals. Defer refills to PCP 02/24/20 03/25/20  Donato Heinz, MD  insulin glargine (LANTUS) 100  UNIT/ML Solostar Pen Inject 25 Units into the skin 2 (two) times daily. Defer refills to PCP 02/27/20 03/28/20  Grayce Sessions, NP  losartan (COZAAR) 25 MG tablet Take 1 tablet (25 mg total) by mouth daily. 02/24/20 05/24/20  Little Ishikawa, MD  metFORMIN (GLUCOPHAGE) 1000 MG tablet Take 1 tablet (1,000 mg total) by mouth 2 (two) times daily with a meal. Defer refills to PCP 02/27/20   Grayce Sessions, NP  metoprolol succinate (TOPROL-XL) 50 MG 24 hr tablet Take 1 tablet (50 mg total) by mouth daily. 03/09/20   Little Ishikawa, MD  nitroGLYCERIN (NITROSTAT) 0.4 MG SL tablet Place 1 tablet (0.4 mg total) under the tongue  every 5 (five) minutes x 3 doses as needed for chest pain. 02/24/20 03/25/20  Little Ishikawa, MD  sertraline (ZOLOFT) 25 MG tablet Take 1 tablet (25 mg total) by mouth daily. 02/27/20   Grayce Sessions, NP  spironolactone (ALDACTONE) 25 MG tablet Take 0.5 tablets (12.5 mg total) by mouth daily. 03/09/20 03/04/21  Little Ishikawa, MD   DG Chest 1 View  Result Date: 03/24/2020 CLINICAL DATA:  Fall EXAM: CHEST  1 VIEW COMPARISON:  03/24/2020 FINDINGS: Mild cardiomegaly. No focal airspace disease or effusion. No pneumothorax. Incompletely visualized proximal right humerus fracture. IMPRESSION: Mild cardiomegaly.  Negative for edema or infiltrate. Electronically Signed   By: Jasmine Pang M.D.   On: 03/24/2020 23:56   DG Ribs Unilateral W/Chest Right  Result Date: 03/24/2020 CLINICAL DATA:  Right rib and shoulder pain EXAM: RIGHT RIBS AND CHEST - 3+ VIEW COMPARISON:  07/04/2019 FINDINGS: Single-view chest demonstrates an acute mildly displaced fracture involving the right humerus neck. Mild cardiomegaly. No focal consolidation or pleural effusion. No pneumothorax. Right rib series demonstrates no acute displaced right rib fracture IMPRESSION: 1. Negative for right displaced rib fracture 2. Acute mildly displaced fracture involving proximal right humerus Electronically Signed   By: Jasmine Pang M.D.   On: 03/24/2020 19:12   DG Shoulder Right  Result Date: 03/24/2020 CLINICAL DATA:  Right shoulder pain EXAM: RIGHT SHOULDER - 2+ VIEW COMPARISON:  None. FINDINGS: AC joint is intact. Acute fracture involving right humeral neck and greater tuberosity with about 1/4 shaft diameter anterior displacement of distal fracture fragment. Mild valgus angulation of distal fracture fragment. The humeral head projects over the glenoid. IMPRESSION: Acute mildly displaced and angulated fracture involving the right humeral neck and greater tuberosity. Electronically Signed   By: Jasmine Pang M.D.   On:  03/24/2020 19:13   DG Knee 2 Views Right  Result Date: 03/24/2020 CLINICAL DATA:  Pain EXAM: RIGHT KNEE - 1-2 VIEW COMPARISON:  None. FINDINGS: No evidence of fracture, dislocation, or joint effusion. No evidence of arthropathy or other focal bone abnormality. Soft tissues are unremarkable. IMPRESSION: Negative. Electronically Signed   By: Katherine Mantle M.D.   On: 03/24/2020 20:47    Positive ROS: All other systems have been reviewed and were otherwise negative with the exception of those mentioned in the HPI and as above.  Objective: Labs cbc Recent Labs    03/24/20 1747 03/25/20 0241  WBC 18.5* 13.9*  HGB 13.7 13.1  HCT 41.7 42.1  PLT 331 299    Labs inflam Recent Labs    03/25/20 0014  CRP 0.6    Labs coag Recent Labs    03/24/20 1747  INR 1.1    Recent Labs    03/24/20 1747 03/25/20 0056  NA 138 139  K 4.2  4.0  CL 102 103  CO2 24 23  GLUCOSE 112* 132*  BUN 18 16  CREATININE 0.93 0.85  CALCIUM 9.4 9.6    Physical Exam: Vitals:   03/25/20 0845 03/25/20 0915  BP: 122/80 124/81  Pulse: (!) 103 (!) 105  Resp: (!) 22 19  Temp:    SpO2: (!) 89% (!) 89%   General: Alert, no acute distress.   Mental status: Alert and Oriented x3 Neurologic: Speech Clear and organized, no gross focal findings or movement disorder appreciated. Respiratory: No cyanosis, no use of accessory musculature Cardiovascular: No pedal edema GI: Abdomen is soft and non-tender, non-distended. Skin: Warm and dry.  No lesions in the area of chief complaint . Extremities: Warm and well perfused w/o edema Psychiatric: Patient is competent for consent with normal mood and affect  MUSCULOSKELETAL:  Other extremities are atraumatic with painless ROM and NVI.  Assessment / Plan: Principal Problem:   Fall at home, initial encounter Active Problems:   Uncontrolled type 2 diabetes mellitus with hyperglycemia, with long-term current use of insulin (HCC)   Coronary artery disease  involving native coronary artery of native heart without angina pectoris   Near syncope   Chronic combined systolic and diastolic congestive heart failure (HCC)   Nicotine dependence, cigarettes, uncomplicated   Closed fracture of right proximal humerus  Pt. Is hesitant about getting surgery at this time. Had a discussion about the alternatives and the possible loss of function.   Weightbearing: NWB RUE Orthopedic device(s): Sling Showering: May shower  Pain control: Continue percocet  Will get CT of the right proximal humerus fx. To further evaluate.  Contact information:  Margarita Rana MD, Daun Peacock PA-C  Rainey Pines PA-C 03/25/2020 9:44 AM

## 2020-03-25 NOTE — Consult Note (Addendum)
Cardiology Consultation:   Patient ID: Caffie Dammearek Paradis MRN: [id number]; DOB: 09/20/1965  Admit date: 03/24/2020 Date of Consult: 03/25/2020  Primary Care Provider: Grayce SessionsEdwards, Michelle P, NP [facility] Cardiologist: Little Ishikawahristopher L Schumann, MD  [facility] Electrophysiologist:  None    Patient Profile:   Caffie Dammearek Haberer is a 55 y.o. male with a hx of severe multivessel disease, DM, HTN, HLD, asthma, tobacco use, and chronic systolic and diastolic heart failure who is being seen today for the evaluation of near syncope at the request of Dr. Catha GosselinMikhail.  History of Present Illness:   [name] has a history of severe multivessel disease. He reports a history of 2 [place] in 2007 and was lost to cardiology follow up. He reestablished care with us during a hospitalization April 2021 for CHF exacerbation. Echo at that time showed an EF of 35-40%, normal RV function, possible bicuspid aortic valve (no AI or AS). He underwent a right and left heart cath 07/09/19 that showed severe diffuse disease in vessels not amenable to intervention. Medical therapy was planned since he was not on any anti-anginals with plans for OM1 intervention if he had refractory symptoms. A1c at that visit was 12.2% - previously undiagnosed DM. Smoking cessation was strongly recommended. He returned to [place] for 6 months because both parents were ill and rans out of all of his medications. He tried buying some medications in [place] that he thought were similar to his home meds,  When he was seen in follow up clinic on 02/24/20, he reported chest heaviness with exertion. He was out of nitro, which always relieves his CP. He was also having SOB and continued to smoke. Dr. Bjorn PippinSchumann restarted his home medications: 25 mg losartan, 50 mg toprol, 20 mg lasix daily, 80 mg lipitor, and plavix 75 mg. Cleda DaubSpiro was at follow up PharmD appt.   [facility] after a near syncopal spell. He was walking down stairs and  suddenly felt dizzy and fell forward landing on his right side. He denied LOC and head injury. He reported to the EDP several week history of lightheadedness and dizziness when he stand from a seated position. Symptoms began in December, coinciding with restarting medications.   Unfortunately, he suffered a right humeral neck fracture with some displacement and angulation.  Workup in ER: AST 68 --> 47 ALT 83 --> 77 WBC 18.5 --> 13.9 BNP 67.8 HS troponin x 2 negative A1c 7.5% from 12.2%  On my interview, he denies orthostatic dizziness. He states he gets dizziness 1-2 times per months, but not when he stands from sitting. He denies chest pain, but does report transient shortness of breath with dizziness. He denies palpitations. He does not think he heart medications need to be changed, but questions his insulin resistance. He is scheduled to see ?endo in Feb. He states he wakes up around 2am every morning to urinate and is always dizzy at that time. He does not check is BG at that time, but is generally in the high 200s in the morning. He takes all cardiac medications in the morning.   History was gathered with the use of an interpreter and was somewhat challenging to determine timeline of symptoms: recent onset in Dec vs August when his parents died and he ran out of medications.    Past Medical History:  Diagnosis Date   AMI (acute myocardial infarction) (HCC)    Anxiety    Asthma    Coronary artery disease  High cholesterol    Hypertension     Past Surgical History:  Procedure Laterality Date   CARDIAC CATHETERIZATION     RIGHT/LEFT HEART CATH AND CORONARY ANGIOGRAPHY N/A 07/09/2019   Procedure: RIGHT/LEFT HEART CATH AND CORONARY ANGIOGRAPHY;  Surgeon: Lennette Bihari, MD;  Location: [facility];  Service: Cardiovascular;  Laterality: N/A;     Home Medications:  Prior to Admission medications   Medication Sig Start Date End Date Taking? Authorizing Provider  clopidogrel  (PLAVIX) 75 MG tablet Take 1 tablet (75 mg total) by mouth daily. 02/24/20  Yes Little Ishikawa, MD  aspirin EC 81 MG tablet Take 81 mg by mouth daily. Swallow whole.    [provider]  atorvastatin (LIPITOR) 80 MG tablet Take 1 tablet (80 mg total) by mouth daily. 02/24/20 03/25/20  Little Ishikawa, MD  furosemide (LASIX) 20 MG tablet Take 1 tablet (20 mg total) by mouth daily. 02/24/20 03/25/20  Little Ishikawa, MD  insulin aspart (NOVOLOG) 100 UNIT/ML FlexPen Inject 8 Units into the skin 3 (three) times daily with meals. Defer refills to PCP 02/24/20 03/25/20  Little Ishikawa, MD  insulin glargine (LANTUS) 100 UNIT/ML Solostar Pen Inject 25 Units into the skin 2 (two) times daily. Defer refills to PCP 02/27/20 03/28/20  Grayce Sessions, NP  losartan (COZAAR) 25 MG tablet Take 1 tablet (25 mg total) by mouth daily. 02/24/20 05/24/20  Little Ishikawa, MD  metFORMIN (GLUCOPHAGE) 1000 MG tablet Take 1 tablet (1,000 mg total) by mouth 2 (two) times daily with a meal. Defer refills to PCP 02/27/20   Grayce Sessions, NP  metoprolol succinate (TOPROL-XL) 50 MG 24 hr tablet Take 1 tablet (50 mg total) by mouth daily. 03/09/20   Little Ishikawa, MD  nitroGLYCERIN (NITROSTAT) 0.4 MG SL tablet Place 1 tablet (0.4 mg total) under the tongue every 5 (five) minutes x 3 doses as needed for chest pain. 02/24/20 03/25/20  Little Ishikawa, MD  sertraline (ZOLOFT) 25 MG tablet Take 1 tablet (25 mg total) by mouth daily. 02/27/20   Grayce Sessions, NP  spironolactone (ALDACTONE) 25 MG tablet Take 0.5 tablets (12.5 mg total) by mouth daily. 03/09/20 03/04/21  Little Ishikawa, MD    Inpatient Medications: Scheduled Meds:  aspirin EC  81 mg Oral Daily   atorvastatin  80 mg Oral Daily   clopidogrel  75 mg Oral Daily   enoxaparin (LOVENOX) injection  40 mg Subcutaneous Daily   insulin aspart  0-15 Units Subcutaneous TID AC & HS   insulin aspart  8 Units  Subcutaneous TID WC   insulin glargine  25 Units Subcutaneous BID   losartan  25 mg Oral Daily   metoprolol succinate  50 mg Oral Daily   sertraline  25 mg Oral Daily   sodium chloride flush  3 mL Intravenous Q12H   Continuous Infusions:   PRN Meds: acetaminophen **OR** acetaminophen, melatonin, oxyCODONE-acetaminophen **OR** morphine injection, nitroGLYCERIN, ondansetron **OR** ondansetron (ZOFRAN) IV, polyethylene glycol  Allergies:    Allergies  Allergen Reactions   Penicillins Itching    Social History:   Social History   Socioeconomic History   Marital status: Single    Spouse name: Not on file   Number of children: Not on file   Years of education: Not on file   Highest education level: Not on file  Occupational History   Not on file  Tobacco Use   Smoking status: Current Every Day Smoker  Packs/day: 1.00    Years: 0.50    Pack years: 0.50    Types: Cigarettes   Smokeless tobacco: Never Used  Vaping Use   Vaping Use: Never used  Substance and Sexual Activity   Alcohol use: Never   Drug use: Never   Sexual activity: Not on file  Other Topics Concern   Not on file  Social History Narrative   Not on file   Social Determinants of Health   Financial Resource Strain: High Risk   Difficulty of Paying Living Expenses: Hard  Food Insecurity: No Food Insecurity   Worried About Running Out of Food in the Last Year: Never true   Ran Out of Food in the Last Year: Never true  Transportation Needs: No Transportation Needs   Lack of Transportation (Medical): No   Lack of Transportation (Non-Medical): No  Physical Activity: Not on file  Stress: Not on file  Social Connections: Not on file  Intimate Partner Violence: Not on file    Family History:    Family History  Problem Relation Age of Onset   Other Neg Hx      ROS:  Please see the history of present illness.   All other ROS reviewed and negative.     Physical Exam/Data:   Vitals:   03/25/20 0915  03/25/20 1115 03/25/20 1145 03/25/20 1215  BP: 124/81 121/74 110/74 119/76  Pulse: (!) 105 (!) 102 99 96  Resp: 19 (!) 28 (!) 22 19  Temp:      TempSrc:      SpO2: (!) 89% 90% (!) 88% 95%  Weight:      Height:       No intake or output data in the 24 hours ending 03/25/20 1308 Last 3 Weights 03/24/2020 03/17/2020 03/09/2020  Weight (lbs) 194 lb 192 lb 3.2 oz 193 lb 9.6 oz  Weight (kg) 87.998 kg 87.181 kg 87.816 kg     Body mass index is 28.65 kg/m.  General:  Well nourished, well developed, in no acute distress HEENT: normal Neck: no JVD Vascular: No carotid bruits Cardiac:  normal S1, S2; RRR; no murmur  Lungs:  clear to auscultation bilaterally, no wheezing, rhonchi or rales  Abd: soft, nontender, no hepatomegaly  Ext: no edema Musculoskeletal:  No deformities, BUE and BLE strength normal and equal Skin: warm and dry  Neuro:  CNs 2-12 intact, no focal abnormalities noted Psych:  Normal affect   EKG:  The EKG was personally reviewed and demonstrates:  Sinus tachycardia HR 101 Telemetry:  Telemetry was personally reviewed and demonstrates:  N/A  Relevant CV Studies:  Echo 07/06/19: 1. Left ventricular ejection fraction, by estimation, is 35 to 40%. The  left ventricle has moderately decreased function. The left ventricle  demonstrates regional wall motion abnormalities (see scoring  diagram/findings for description). Left ventricular   diastolic parameters are indeterminate.   2. Right ventricular systolic function is normal. The right ventricular  size is normal.   3. Left atrial size was moderately dilated.   4. The mitral valve is normal in structure. Mild mitral valve  regurgitation. No evidence of mitral stenosis.   5. Indeterminate cusp structure. Given age and amount of calcification,  high suspicion for bicuspid valve, either functional or congenital.. The  aortic valve has an indeterminant number of cusps. Aortic valve  regurgitation is not visualized. Mild to   moderate aortic valve sclerosis/calcification is present, without any  evidence of aortic stenosis.   6. The inferior vena  cava is normal in size with greater than 50%  respiratory variability, suggesting right atrial pressure of 3 mmHg.    Right and left heart cath 07/09/19: Mid RCA to Dist RCA lesion is 100% stenosed. Acute Mrg lesion is 90% stenosed. Ost RCA to Prox RCA lesion is 70% stenosed. Prox RCA to Mid RCA lesion is 90% stenosed. 1st Mrg lesion is 85% stenosed. 2nd Mrg-1 lesion is 100% stenosed. 2nd Mrg-2 lesion is 100% stenosed. 3rd Mrg lesion is 90% stenosed. Dist Cx lesion is 70% stenosed. Dist LAD lesion is 100% stenosed. 2nd Diag lesion is 95% stenosed. Mid LAD lesion is 50% stenosed. 1st Diag-2 lesion is 40% stenosed. 1st Diag-1 lesion is 80% stenosed. Mid Cx to Dist Cx lesion is 30% stenosed.   Severe diffuse multivessel CAD with moderate irregularity of the LAD with 80% proximal diagonal stenosis prior to a previously placed stent with intimal hyperplasia within the stented segment, 50% the stenosis, 95% stenosis in the second diagonal vessel and total occlusion of the apical LAD; left circumflex vessel with large OM1 vessel with 85% stenosis proximal to an aneurysmal segment, total occlusion of the OM 2 vessel with previously placed stent in this vessel and 90 and 70% bifurcation stenoses in the distal circumflex; severely diffusely diseased RCA with distal occlusion.  There is faint filling of a probable small PLA vessel via the left injection.   Normal right heart pressures.   LVEDP 16 mmHg.   RECOMMENDATION: Plan initiate medical therapy since at present he is not on any anti-ischemic medications.  Most vessels are not amenable to intervention; however if patient experiences increasing chest pain symptomatology the large OM1 vessel can potentially undergo intervention with stenting.  Aggressive lipid-lowering therapy with target LDL less than 70.  Optimal blood  pressure control.  Smoking cessation is essential.  With diffuse CAD consider DAPT therapy.  Diagnostic Dominance: Right     Laboratory Data:  High Sensitivity Troponin:   Recent Labs  Lab 03/24/20 2019 03/25/20 0014  TROPONINIHS 9 9     Chemistry Recent Labs  Lab 03/24/20 1747 03/25/20 0056  NA 138 139  K 4.2 4.0  CL 102 103  CO2 24 23  GLUCOSE 112* 132*  BUN 18 16  CREATININE 0.93 0.85  CALCIUM 9.4 9.6  GFRNONAA >60 >60  ANIONGAP 12 13    Recent Labs  Lab 03/24/20 1747 03/25/20 0056  PROT 6.9 7.1  ALBUMIN 3.9 4.0  AST 68* 47*  ALT 83* 77*  ALKPHOS 45 49  BILITOT 0.6 0.6   Hematology Recent Labs  Lab 03/24/20 1747 03/25/20 0241  WBC 18.5* 13.9*  RBC 5.39 5.34  HGB 13.7 13.1  HCT 41.7 42.1  MCV 77.4* 78.8*  MCH 25.4* 24.5*  MCHC 32.9 31.1  RDW 13.6 13.4  PLT 331 299   BNP Recent Labs  Lab 03/25/20 0014  BNP 67.8    DDimer No results for input(s): DDIMER in the last 168 hours.   Radiology/Studies:  DG Chest 1 View  Result Date: 03/24/2020 CLINICAL DATA:  Fall EXAM: CHEST  1 VIEW COMPARISON:  03/24/2020 FINDINGS: Mild cardiomegaly. No focal airspace disease or effusion. No pneumothorax. Incompletely visualized proximal right humerus fracture. IMPRESSION: Mild cardiomegaly.  Negative for edema or infiltrate. Electronically Signed   By: Jasmine Pang M.D.   On: 03/24/2020 23:56   DG Ribs Unilateral W/Chest Right  Result Date: 03/24/2020 CLINICAL DATA:  Right rib and shoulder pain EXAM: RIGHT RIBS AND CHEST - 3+ VIEW  COMPARISON:  07/04/2019 FINDINGS: Single-view chest demonstrates an acute mildly displaced fracture involving the right humerus neck. Mild cardiomegaly. No focal consolidation or pleural effusion. No pneumothorax. Right rib series demonstrates no acute displaced right rib fracture IMPRESSION: 1. Negative for right displaced rib fracture 2. Acute mildly displaced fracture involving proximal right humerus Electronically Signed   By: Jasmine Pang M.D.   On: 03/24/2020 19:12   DG Shoulder Right  Result Date: 03/24/2020 CLINICAL DATA:  Right shoulder pain EXAM: RIGHT SHOULDER - 2+ VIEW COMPARISON:  None. FINDINGS: AC joint is intact. Acute fracture involving right humeral neck and greater tuberosity with about 1/4 shaft diameter anterior displacement of distal fracture fragment. Mild valgus angulation of distal fracture fragment. The humeral head projects over the glenoid. IMPRESSION: Acute mildly displaced and angulated fracture involving the right humeral neck and greater tuberosity. Electronically Signed   By: Jasmine Pang M.D.   On: 03/24/2020 19:13   DG Knee 2 Views Right  Result Date: 03/24/2020 CLINICAL DATA:  Pain EXAM: RIGHT KNEE - 1-2 VIEW COMPARISON:  None. FINDINGS: No evidence of fracture, dislocation, or joint effusion. No evidence of arthropathy or other focal bone abnormality. Soft tissues are unremarkable. IMPRESSION: Negative. Electronically Signed   By: Katherine Mantle M.D.   On: 03/24/2020 20:47   ECHOCARDIOGRAM COMPLETE  Result Date: 03/25/2020    ECHOCARDIOGRAM REPORT   Patient Name:   EDWAR COE Date of Exam: 03/25/2020 Medical Rec #:  [id number]   Height:       69.0 in Accession #:    [id number]  Weight:       194.0 lb Date of Birth:  23-Dec-1965    BSA:          2.039 m Patient Age:    54 years    BP:           124/81 mmHg Patient Gender: M           HR:           101 bpm. Exam Location:  Inpatient Procedure: 2D Echo, Cardiac Doppler and Color Doppler Indications:    Syncope 780.2 / R55  History:        Patient has prior history of Echocardiogram examinations, most                 recent 07/06/2019. Acute MI and CAD; Risk Factors:Hypertension,                 Dyslipidemia and Current Smoker.  Sonographer:    Renella Cunas RDCS Referring Phys: [id number] [facility] IMPRESSIONS  1. Left ventricular ejection fraction, by estimation, is 45 to 50%. The left ventricle has mildly decreased function. The left ventricle  demonstrates global hypokinesis. Left ventricular diastolic parameters are consistent with Grade I diastolic dysfunction (impaired relaxation).  2. Right ventricular systolic function is normal. The right ventricular size is normal. Tricuspid regurgitation signal is inadequate for assessing PA pressure.  3. The mitral valve is normal in structure. No evidence of mitral valve regurgitation. No evidence of mitral stenosis.  4. The aortic valve is normal in structure. Aortic valve regurgitation is not visualized. No aortic stenosis is present.  5. The inferior vena cava is normal in size with greater than 50% respiratory variability, suggesting right atrial pressure of 3 mmHg. FINDINGS  Left Ventricle: Left ventricular ejection fraction, by estimation, is 45 to 50%. The left ventricle has mildly decreased function. The left ventricle demonstrates global hypokinesis. The left ventricular  internal cavity size was normal in size. There is  no left ventricular hypertrophy. Left ventricular diastolic parameters are consistent with Grade I diastolic dysfunction (impaired relaxation). Normal left ventricular filling pressure. Right Ventricle: The right ventricular size is normal. No increase in right ventricular wall thickness. Right ventricular systolic function is normal. Tricuspid regurgitation signal is inadequate for assessing PA pressure. Left Atrium: Left atrial size was normal in size. Right Atrium: Right atrial size was normal in size. Pericardium: There is no evidence of pericardial effusion. Mitral Valve: The mitral valve is normal in structure. Mild mitral annular calcification. No evidence of mitral valve regurgitation. No evidence of mitral valve stenosis. Tricuspid Valve: The tricuspid valve is normal in structure. Tricuspid valve regurgitation is trivial. No evidence of tricuspid stenosis. Aortic Valve: The aortic valve is normal in structure. Aortic valve regurgitation is not visualized. No aortic stenosis is  present. Pulmonic Valve: The pulmonic valve was normal in structure. Pulmonic valve regurgitation is not visualized. No evidence of pulmonic stenosis. Aorta: The aortic root is normal in size and structure. Venous: The inferior vena cava is normal in size with greater than 50% respiratory variability, suggesting right atrial pressure of 3 mmHg. IAS/Shunts: No atrial level shunt detected by color flow Doppler.  LEFT VENTRICLE PLAX 2D LVOT diam:     2.20 cm      Diastology LV SV:         51           LV e' medial:    4.47 cm/s LV SV Index:   25           LV E/e' medial:  17.2 LVOT Area:     3.80 cm     LV e' lateral:   9.37 cm/s                             LV E/e' lateral: 8.2  LV Volumes (MOD) LV vol d, MOD A2C: 159.0 ml LV vol d, MOD A4C: 159.0 ml LV vol s, MOD A2C: 82.4 ml LV vol s, MOD A4C: 105.0 ml LV SV MOD A2C:     76.6 ml LV SV MOD A4C:     159.0 ml LV SV MOD BP:      67.1 ml RIGHT VENTRICLE RV S prime:     13.10 cm/s TAPSE (M-mode): 2.0 cm LEFT ATRIUM             Index       RIGHT ATRIUM           Index LA Vol (A2C):   53.3 ml 26.13 ml/m RA Area:     17.70 cm LA Vol (A4C):   56.6 ml 27.75 ml/m RA Volume:   41.70 ml  20.45 ml/m LA Biplane Vol: 56.6 ml 27.75 ml/m  AORTIC VALVE LVOT Vmax:   79.80 cm/s LVOT Vmean:  55.500 cm/s LVOT VTI:    0.134 m  AORTA Ao Asc diam: 3.40 cm MITRAL VALVE MV Area (PHT): 7.99 cm     SHUNTS MV Decel Time: 95 msec      Systemic VTI:  0.13 m MV E velocity: 77.10 cm/s   Systemic Diam: 2.20 cm MV A velocity: 101.00 cm/s MV E/A ratio:  0.76 Fransico Him MD Electronically signed by Fransico Him MD Signature Date/Time: 03/25/2020/12:34:17 PM    Final      Assessment and Plan:   Near syncope - was not orthostatic upon arrival  in the ER - he reports 1-2 episodes of dizziness per month since both of his parents died on the same day in 2022-11-08 - question orthostatic component vs hypoglycemia (likely intolerant to "normal-high" BG due to long-standing hyperglycemia) - monitor on  telemetry - will likely discharge with a heart monitor - 30 day event monitor    Sinus tachycardia - was tachycardic on arrival - likely related to pain - low suspicion for PE, not requiring oxygen, not consisently short of breath - monitor on telemetry   CAD - known severe multivessel disease without targets except possibly OM1 - continue ASA and plavix - he denies chest pain - troponin x 2 negative - EKG nonischemic   Chronic systolic and diastolic heart failure - EF 35-40% with CAD as above - recently restarted toprol which was titrated to 50 mg, 25 mg losartan, and 12.5 mg spironolactone - symptoms do not sound orthostatic in nature and he denies hypotension at home - will continue cardiac medications without changes for now - repeat an echocardiogram - he appears euvolemic, BNP normal   IDDM - A1c 7.5%, improved from 12.2% at diagnosis - insulin per primary, metformin   Hyperlipidemia with LDL goal < 70 07/07/2019: VLDL 29 02/24/2020: Cholesterol, Total 280; HDL 33; LDL Chol Calc (NIH) 190; Triglycerides 287 - on 80 mg lipitor - recheck lipids to see if LDL is improving with resumption of statin - will likely need additional therapy - will need to follow LFTS - were elevated on arrival but now trending down   Right humerus fracture  - ortho has consulted and recommended surgery - patient currently refusing surgery, but seems confused about needing surgery - hopefully ortho can revisit with him tomorrow      For questions or updates, please contact [facility] Please consult [email or web address] for contact info under    Signed, Marcelino Duster, PA  03/25/2020 1:08 PM  Patient seen and examined  Agree with findings as noted above by A [facility] Pt with hx of severe CAD and systolc CHF    ad recent CHF exacerbation due to running out meds Also hx of intermitt dizziness Yesterday had dizzy spell  Fell  Injured shoulder.  No LOC History obtained with assistance of  interpreter  ON exam,Pt in NAD Neck::  JVP increased Lungs are rel clear, mild rhonchi Cardiac   RRR  Gr II/VI systolic murmur LSB    Abd is supple   Ext are without edema    Recomm:   Would continue tele.   Would recomm echo to reeval LVEF  Check orthostatics Add BNP to labs     Will continue to follow   If above negative, would consider 4 wk monitor to r/o arrhythmia  Dietrich Pates MD

## 2020-03-25 NOTE — ED Notes (Signed)
Explained NPO status to patient. Patients family member also made aware of pt being NPO. Both verbalized understanding.

## 2020-03-25 NOTE — Progress Notes (Signed)
PROGRESS NOTE    Larry Lewis  RUE:[id number] DOB: 11/03/1965 DOA: 03/24/2020 PCP: Grayce SessionsEdwards, Michelle P, NP   Brief Narrative:  HPI on 03/24/2020 by Dr. Shauna HughGeorge Shalhoub 55 year old male with past medical history of hypertension, asthma, coronary artery disease (cath with stent x 2 in 2007, cath 06/2019 with diffuse CAD - med mgmt), insulin-dependent diabetes mellitus type 2, systolic and diastolic congestive heart failure (Echo 06/2019 EF 35-40%), nicotine dependence who presents to [facility] emergency department status post fall complaining of right shoulder pain.  Patient explains that in the past several weeks he has been experiencing significant lightheadedness that he describes as "dizziness", primarily upon rising from a seated position.  Symptoms sound like the began sometime in December.  It turns out that patient was not taking any medications for nearly 6 months in 2021 after going to [place] to care for his ill parents (who have now unfortunately expired in 11/2019).  He ran out of medications while there and went several months without medications before returning back to [place] and being restarted on his medication regimen in December.  Per review of cardiology notes, patient was complaining of multiple symptoms including weight gain, dyspnea on exertion and exertional chest pain.  At this time, patient states that his exertional chest pain and exertional dyspnea have improved.  However, patient is now experiencing significant lightheadedness since resuming his medications.  At approximately 3 PM on 1/4, the patient was at his friend's restaurant walking down the stairs from the second floor to the first he suddenly had an episode of lightheadedness, similar to what is mentioned above.  While he did not completely lose consciousness, he fell down the stairs and suddenly began to experience severe right shoulder pain.  Pain was 10 out of 10 in intensity, sharp in quality,  nonradiating and worse with any attempt to move the right upper extremity.  EMS was contacted and the patient was brought into [facility] emergency department for evaluation.  Upon evaluation at the [facility] emergency department, x-ray of the right upper extremity revealed a proximal right humeral neck fracture with some displacement and angulation.  Emergency department divider discussed the case with Dr. Eulah PontMurphy with orthopedic surgery who stated that they will consult on the patient in the morning.  Patient has been fitted with a arm immobilizer while in the emergency department.  Patient's case is also been reviewed with Dr. Royann Shiversroitoru with cardiology.  According the emergency department provider, cardiology feels there is no significant change in the patient's EKG today compared to prior and that cardiology can be formally consulted in the morning if felt indicated by the primary service.   Patient received several doses of intravenous morphine for pain control.  The hospitalist group was then called to assess patient for admission the hospital.    Assessment & Plan   Near syncope with fall -Patient has been experiencing frequent episodes of lightheadedness upon standing from a seated position which has been occurring for the past several weeks -Suspect secondary to patient's cardiac medications given that he was off of them for several months during his trip to [place] -Echocardiogram EF of 45 to 50%, grade 1 diastolic dysfunction -Orthostatic vitals unremarkable -Cardiology consulted and appreciated  Closed fracture of right proximal humerus -Secondary to fall -Orthopedics consulted and appreciated -Continue pain control -Arm currently in sling  Diabetes mellitus, type II uncontrolled with hyperglycemia -Hemoglobin A1c 7.5 -Metformin held -Continue Lantus, insulin sliding scale and CBG monitoring  Coronary disease -No complaints of chest pain at this time -Patient  with known extensive multivessel coronary disease -Cardiology consulted and appreciated -Continue aspirin, Plavix, statin  Chronic combined systolic and diastolic heart failure -BNP appears to be normal -Patient appears to be euvolemic and compensated -Echocardiogram obtained for new syncope -Monitor intake and output, daily weights  Nicotine dependence -Smoking cessation discussed   DVT Prophylaxis Lovenox  Code Status: Full  Family Communication: None at bedside  Disposition Plan:  Status is: Observation  The patient remains OBS appropriate and will d/c before 2 midnights.  Dispo: The patient is from: Home              Anticipated d/c is to: Home              Anticipated d/c date is: 1 day              Patient currently is not medically stable to d/c.   Consultants Cardiology Orthopedic surgery  Procedures  Echocardiogram  Antibiotics   Anti-infectives (From admission, onward)   None      Subjective:   Larry Damme seen and examined today.  Complains of pain in the right arm.  Denies current chest pain or shortness of breath, abdominal pain, nausea or vomiting, dizziness or headache.  Would like to go home soon.  Objective:   Vitals:   03/25/20 0915 03/25/20 1115 03/25/20 1145 03/25/20 1215  BP: 124/81 121/74 110/74 119/76  Pulse: (!) 105 (!) 102 99 96  Resp: 19 (!) 28 (!) 22 19  Temp:      TempSrc:      SpO2: (!) 89% 90% (!) 88% 95%  Weight:      Height:       No intake or output data in the 24 hours ending 03/25/20 1457 Filed Weights   03/24/20 2117  Weight: 88 kg    Exam  General: Well developed, well nourished, NAD, appears stated age  HEENT: NCAT, mucous membranes moist.   Cardiovascular: S1 S2 auscultated, soft SEM, RRR.  Respiratory: Clear to auscultation bilaterally with equal chest rise  Abdomen: Soft, nontender, nondistended, + bowel sounds  Extremities: warm dry without cyanosis clubbing or edema.  RUE in sling  Neuro: AAOx3,  nonfocal  Psych: Normal affect and demeanor with intact judgement and insight   Data Reviewed: I have personally reviewed following labs and imaging studies  CBC: Recent Labs  Lab 03/24/20 1747 03/25/20 0241  WBC 18.5* 13.9*  NEUTROABS 13.7* 9.3*  HGB 13.7 13.1  HCT 41.7 42.1  MCV 77.4* 78.8*  PLT 331 299   Basic Metabolic Panel: Recent Labs  Lab 03/24/20 1747 03/25/20 0014 03/25/20 0056  NA 138  --  139  K 4.2  --  4.0  CL 102  --  103  CO2 24  --  23  GLUCOSE 112*  --  132*  BUN 18  --  16  CREATININE 0.93  --  0.85  CALCIUM 9.4  --  9.6  MG  --  1.8  --    GFR: Estimated Creatinine Clearance: 107.8 mL/min (by C-G formula based on SCr of 0.85 mg/dL). Liver Function Tests: Recent Labs  Lab 03/24/20 1747 03/25/20 0056  AST 68* 47*  ALT 83* 77*  ALKPHOS 45 49  BILITOT 0.6 0.6  PROT 6.9 7.1  ALBUMIN 3.9 4.0   No results for input(s): LIPASE, AMYLASE in the last 168 hours. No results for input(s): AMMONIA in the last 168 hours.  Coagulation Profile: Recent Labs  Lab 03/24/20 1747  INR 1.1   Cardiac Enzymes: No results for input(s): CKTOTAL, CKMB, CKMBINDEX, TROPONINI in the last 168 hours. BNP (last 3 results) No results for input(s): PROBNP in the last 8760 hours. HbA1C: Recent Labs    03/25/20 0014  HGBA1C 7.5*   CBG: Recent Labs  Lab 03/24/20 1750 03/24/20 1944 03/25/20 0827 03/25/20 1136  GLUCAP 112* 117* 105* 149*   Lipid Profile: No results for input(s): CHOL, HDL, LDLCALC, TRIG, CHOLHDL, LDLDIRECT in the last 72 hours. Thyroid Function Tests: No results for input(s): TSH, T4TOTAL, FREET4, T3FREE, THYROIDAB in the last 72 hours. Anemia Panel: No results for input(s): VITAMINB12, FOLATE, FERRITIN, TIBC, IRON, RETICCTPCT in the last 72 hours. Urine analysis:    Component Value Date/Time   COLORURINE YELLOW 03/25/2020 0014   APPEARANCEUR CLOUDY (A) 03/25/2020 0014   LABSPEC 1.016 03/25/2020 0014   PHURINE 7.0 03/25/2020 0014    GLUCOSEU NEGATIVE 03/25/2020 0014   HGBUR NEGATIVE 03/25/2020 0014   BILIRUBINUR NEGATIVE 03/25/2020 0014   KETONESUR NEGATIVE 03/25/2020 0014   PROTEINUR 100 (A) 03/25/2020 0014   NITRITE NEGATIVE 03/25/2020 0014   LEUKOCYTESUR NEGATIVE 03/25/2020 0014   Sepsis Labs: @LABRCNTIP (procalcitonin:4,lacticidven:4)  ) Recent Results (from the past 240 hour(s))  SARS CORONAVIRUS 2 (TAT 6-24 HRS) Nasopharyngeal Nasopharyngeal Swab     Status: None   Collection Time: 03/24/20  8:13 PM   Specimen: Nasopharyngeal Swab  Result Value Ref Range Status   SARS Coronavirus 2 NEGATIVE NEGATIVE Final    Comment: (NOTE) SARS-CoV-2 target nucleic acids are NOT DETECTED.  The SARS-CoV-2 RNA is generally detectable in upper and lower respiratory specimens during the acute phase of infection. Negative results do not preclude SARS-CoV-2 infection, do not rule out co-infections with other pathogens, and should not be used as the sole basis for treatment or other patient management decisions. Negative results must be combined with clinical observations, patient history, and epidemiological information. The expected result is Negative.  Fact Sheet for Patients: 05/22/20  Fact Sheet for Healthcare Providers: HairSlick.no  This test is not yet approved or cleared by the quierodirigir.com FDA and  has been authorized for detection and/or diagnosis of SARS-CoV-2 by FDA under an Emergency Use Authorization (EUA). This EUA will remain  in effect (meaning this test can be used) for the duration of the COVID-19 declaration under Se ction 564(b)(1) of the Act, 21 U.S.C. section 360bbb-3(b)(1), unless the authorization is terminated or revoked sooner.  Performed at [facility], [address], [place], [place]       Radiology Studies: DG Chest 1 View  Result Date: 03/24/2020 CLINICAL DATA:  Fall EXAM: CHEST  1 VIEW COMPARISON:   03/24/2020 FINDINGS: Mild cardiomegaly. No focal airspace disease or effusion. No pneumothorax. Incompletely visualized proximal right humerus fracture. IMPRESSION: Mild cardiomegaly.  Negative for edema or infiltrate. Electronically Signed   By: 05/22/2020 M.D.   On: 03/24/2020 23:56   DG Ribs Unilateral W/Chest Right  Result Date: 03/24/2020 CLINICAL DATA:  Right rib and shoulder pain EXAM: RIGHT RIBS AND CHEST - 3+ VIEW COMPARISON:  07/04/2019 FINDINGS: Single-view chest demonstrates an acute mildly displaced fracture involving the right humerus neck. Mild cardiomegaly. No focal consolidation or pleural effusion. No pneumothorax. Right rib series demonstrates no acute displaced right rib fracture IMPRESSION: 1. Negative for right displaced rib fracture 2. Acute mildly displaced fracture involving proximal right humerus Electronically Signed   By: 07/06/2019.D.  On: 03/24/2020 19:12   DG Shoulder Right  Result Date: 03/24/2020 CLINICAL DATA:  Right shoulder pain EXAM: RIGHT SHOULDER - 2+ VIEW COMPARISON:  None. FINDINGS: AC joint is intact. Acute fracture involving right humeral neck and greater tuberosity with about 1/4 shaft diameter anterior displacement of distal fracture fragment. Mild valgus angulation of distal fracture fragment. The humeral head projects over the glenoid. IMPRESSION: Acute mildly displaced and angulated fracture involving the right humeral neck and greater tuberosity. Electronically Signed   By: Jasmine Pang M.D.   On: 03/24/2020 19:13   DG Knee 2 Views Right  Result Date: 03/24/2020 CLINICAL DATA:  Pain EXAM: RIGHT KNEE - 1-2 VIEW COMPARISON:  None. FINDINGS: No evidence of fracture, dislocation, or joint effusion. No evidence of arthropathy or other focal bone abnormality. Soft tissues are unremarkable. IMPRESSION: Negative. Electronically Signed   By: Katherine Mantle M.D.   On: 03/24/2020 20:47   ECHOCARDIOGRAM COMPLETE  Result Date: 03/25/2020    ECHOCARDIOGRAM  REPORT   Patient Name:   DELFIN SQUILLACE Date of Exam: 03/25/2020 Medical Rec #:  [id number]   Height:       69.0 in Accession #:    [id number]  Weight:       194.0 lb Date of Birth:  04-08-1965    BSA:          2.039 m Patient Age:    54 years    BP:           124/81 mmHg Patient Gender: M           HR:           101 bpm. Exam Location:  Inpatient Procedure: 2D Echo, Cardiac Doppler and Color Doppler Indications:    Syncope 780.2 / R55  History:        Patient has prior history of Echocardiogram examinations, most                 recent 07/06/2019. Acute MI and CAD; Risk Factors:Hypertension,                 Dyslipidemia and Current Smoker.  Sonographer:    Renella Cunas RDCS Referring Phys: [id number] Deno Lunger [facility] IMPRESSIONS  1. Left ventricular ejection fraction, by estimation, is 45 to 50%. The left ventricle has mildly decreased function. The left ventricle demonstrates global hypokinesis. Left ventricular diastolic parameters are consistent with Grade I diastolic dysfunction (impaired relaxation).  2. Right ventricular systolic function is normal. The right ventricular size is normal. Tricuspid regurgitation signal is inadequate for assessing PA pressure.  3. The mitral valve is normal in structure. No evidence of mitral valve regurgitation. No evidence of mitral stenosis.  4. The aortic valve is normal in structure. Aortic valve regurgitation is not visualized. No aortic stenosis is present.  5. The inferior vena cava is normal in size with greater than 50% respiratory variability, suggesting right atrial pressure of 3 mmHg. FINDINGS  Left Ventricle: Left ventricular ejection fraction, by estimation, is 45 to 50%. The left ventricle has mildly decreased function. The left ventricle demonstrates global hypokinesis. The left ventricular internal cavity size was normal in size. There is  no left ventricular hypertrophy. Left ventricular diastolic parameters are consistent with Grade I diastolic dysfunction (impaired  relaxation). Normal left ventricular filling pressure. Right Ventricle: The right ventricular size is normal. No increase in right ventricular wall thickness. Right ventricular systolic function is normal. Tricuspid regurgitation signal is inadequate for assessing PA pressure. Left Atrium: Left  atrial size was normal in size. Right Atrium: Right atrial size was normal in size. Pericardium: There is no evidence of pericardial effusion. Mitral Valve: The mitral valve is normal in structure. Mild mitral annular calcification. No evidence of mitral valve regurgitation. No evidence of mitral valve stenosis. Tricuspid Valve: The tricuspid valve is normal in structure. Tricuspid valve regurgitation is trivial. No evidence of tricuspid stenosis. Aortic Valve: The aortic valve is normal in structure. Aortic valve regurgitation is not visualized. No aortic stenosis is present. Pulmonic Valve: The pulmonic valve was normal in structure. Pulmonic valve regurgitation is not visualized. No evidence of pulmonic stenosis. Aorta: The aortic root is normal in size and structure. Venous: The inferior vena cava is normal in size with greater than 50% respiratory variability, suggesting right atrial pressure of 3 mmHg. IAS/Shunts: No atrial level shunt detected by color flow Doppler.  LEFT VENTRICLE PLAX 2D LVOT diam:     2.20 cm      Diastology LV SV:         51           LV e' medial:    4.47 cm/s LV SV Index:   25           LV E/e' medial:  17.2 LVOT Area:     3.80 cm     LV e' lateral:   9.37 cm/s                             LV E/e' lateral: 8.2  LV Volumes (MOD) LV vol d, MOD A2C: 159.0 ml LV vol d, MOD A4C: 159.0 ml LV vol s, MOD A2C: 82.4 ml LV vol s, MOD A4C: 105.0 ml LV SV MOD A2C:     76.6 ml LV SV MOD A4C:     159.0 ml LV SV MOD BP:      67.1 ml RIGHT VENTRICLE RV S prime:     13.10 cm/s TAPSE (M-mode): 2.0 cm LEFT ATRIUM             Index       RIGHT ATRIUM           Index LA Vol (A2C):   53.3 ml 26.13 ml/m RA Area:      17.70 cm LA Vol (A4C):   56.6 ml 27.75 ml/m RA Volume:   41.70 ml  20.45 ml/m LA Biplane Vol: 56.6 ml 27.75 ml/m  AORTIC VALVE LVOT Vmax:   79.80 cm/s LVOT Vmean:  55.500 cm/s LVOT VTI:    0.134 m  AORTA Ao Asc diam: 3.40 cm MITRAL VALVE MV Area (PHT): 7.99 cm     SHUNTS MV Decel Time: 95 msec      Systemic VTI:  0.13 m MV E velocity: 77.10 cm/s   Systemic Diam: 2.20 cm MV A velocity: 101.00 cm/s MV E/A ratio:  0.76 Armanda Magic MD Electronically signed by Armanda Magic MD Signature Date/Time: 03/25/2020/12:34:17 PM    Final      Scheduled Meds: . aspirin EC  81 mg Oral Daily  . atorvastatin  80 mg Oral Daily  . clopidogrel  75 mg Oral Daily  . enoxaparin (LOVENOX) injection  40 mg Subcutaneous Daily  . insulin aspart  0-15 Units Subcutaneous TID AC & HS  . insulin aspart  8 Units Subcutaneous TID WC  . insulin glargine  25 Units Subcutaneous BID  . losartan  25 mg Oral Daily  . metoprolol  succinate  50 mg Oral Daily  . sertraline  25 mg Oral Daily  . sodium chloride flush  3 mL Intravenous Q12H   Continuous Infusions:   LOS: 0 days   Time Spent in minutes   45 minutes  Parilee Hally D.O. on 03/25/2020 at 2:57 PM  Between 7am to 7pm - Please see pager noted on [email or web address]  After 7pm go to [email or web address]  And look for the night coverage person covering for me after hours  [facility] Office  [phone]

## 2020-03-25 NOTE — ED Notes (Signed)
Spoke to lab about add on for CMP.

## 2020-03-25 NOTE — ED Notes (Signed)
Pt moved to room 40. Continues to c/o pain. Will notify MD. Pt has already had both PRN pain medications.

## 2020-03-25 NOTE — ED Notes (Signed)
PT ambulating patient at this time.

## 2020-03-25 NOTE — H&P (Signed)
History and Physical    Larry Lewis OAC:[id number] DOB: 1965/08/16 DOA: 03/24/2020  PCP: Grayce Sessions, NP  Patient coming from: Home   Chief Complaint:  Chief Complaint  Patient presents with  . Fall     HPI:    55 year old male with past medical history of hypertension, asthma, coronary artery disease (cath with stent x 2 in 2007, cath 06/2019 with diffuse CAD - med mgmt), insulin-dependent diabetes mellitus type 2, systolic and diastolic congestive heart failure (Echo 06/2019 EF 35-40%), nicotine dependence who presents to [facility] emergency department status post fall complaining of right shoulder pain.  Patient explains that in the past several weeks he has been experiencing significant lightheadedness that he describes as "dizziness", primarily upon rising from a seated position.  Symptoms sound like the began sometime in December.  It turns out that patient was not taking any medications for nearly 6 months in 2021 after going to [place] to care for his ill parents (who have now unfortunately expired in 11/2019).  He ran out of medications while there and went several months without medications before returning back to [place] and being restarted on his medication regimen in December.  Per review of cardiology notes, patient was complaining of multiple symptoms including weight gain, dyspnea on exertion and exertional chest pain.  At this time, patient states that his exertional chest pain and exertional dyspnea have improved.  However, patient is now experiencing significant lightheadedness since resuming his medications.  At approximately 3 PM on 1/4, the patient was at his friend's restaurant walking down the stairs from the second floor to the first he suddenly had an episode of lightheadedness, similar to what is mentioned above.  While he did not completely lose consciousness, he fell down the stairs and suddenly began to experience severe right shoulder pain.  Pain  was 10 out of 10 in intensity, sharp in quality, nonradiating and worse with any attempt to move the right upper extremity.  EMS was contacted and the patient was brought into [facility] emergency department for evaluation.  Upon evaluation at the [facility] emergency department, x-ray of the right upper extremity revealed a proximal right humeral neck fracture with some displacement and angulation.  Emergency department divider discussed the case with Dr. [name] with orthopedic surgery who stated that they will consult on the patient in the morning.  Patient has been fitted with a arm immobilizer while in the emergency department.  Patient's case is also been reviewed with Dr. Royann Shivers with cardiology.  According the emergency department provider, cardiology feels there is no significant change in the patient's EKG today compared to prior and that cardiology can be formally consulted in the morning if felt indicated by the primary service.   Patient received several doses of intravenous morphine for pain control.  The hospitalist group was then called to assess patient for admission the hospital.   Review of Systems:   ROS  Past Medical History:  Diagnosis Date  . AMI (acute myocardial infarction) (HCC)   . Anxiety   . Asthma   . Coronary artery disease   . High cholesterol   . Hypertension     Past Surgical History:  Procedure Laterality Date  . CARDIAC CATHETERIZATION    . RIGHT/LEFT HEART CATH AND CORONARY ANGIOGRAPHY N/A 07/09/2019   Procedure: RIGHT/LEFT HEART CATH AND CORONARY ANGIOGRAPHY;  Surgeon: Lennette Bihari, MD;  Location: [facility] INVASIVE CV LAB;  Service: Cardiovascular;  Laterality: N/A;  reports that he has been smoking cigarettes. He has a 0.50 pack-year smoking history. He has never used smokeless tobacco. He reports that he does not drink alcohol and does not use drugs.  Allergies  Allergen Reactions  . Penicillins Itching    Family History   Problem Relation Age of Onset  . Other Neg Hx      Prior to Admission medications   Medication Sig Start Date End Date Taking? Authorizing Provider  aspirin EC 81 MG tablet Take 81 mg by mouth daily. Swallow whole.    [provider]  atorvastatin (LIPITOR) 80 MG tablet Take 1 tablet (80 mg total) by mouth daily. 02/24/20 03/25/20  Donato Heinz, MD  clopidogrel (PLAVIX) 75 MG tablet Take 1 tablet (75 mg total) by mouth daily. 02/24/20   Donato Heinz, MD  furosemide (LASIX) 20 MG tablet Take 1 tablet (20 mg total) by mouth daily. 02/24/20 03/25/20  Donato Heinz, MD  insulin aspart (NOVOLOG) 100 UNIT/ML FlexPen Inject 8 Units into the skin 3 (three) times daily with meals. Defer refills to PCP 02/24/20 03/25/20  Donato Heinz, MD  insulin glargine (LANTUS) 100 UNIT/ML Solostar Pen Inject 25 Units into the skin 2 (two) times daily. Defer refills to PCP 02/27/20 03/28/20  [name], NP  losartan (COZAAR) 25 MG tablet Take 1 tablet (25 mg total) by mouth daily. 02/24/20 05/24/20  Donato Heinz, MD  metFORMIN (GLUCOPHAGE) 1000 MG tablet Take 1 tablet (1,000 mg total) by mouth 2 (two) times daily with a meal. Defer refills to PCP 02/27/20   [name], NP  metoprolol succinate (TOPROL-XL) 50 MG 24 hr tablet Take 1 tablet (50 mg total) by mouth daily. 03/09/20   Donato Heinz, MD  nitroGLYCERIN (NITROSTAT) 0.4 MG SL tablet Place 1 tablet (0.4 mg total) under the tongue every 5 (five) minutes x 3 doses as needed for chest pain. 02/24/20 03/25/20  Donato Heinz, MD  sertraline (ZOLOFT) 25 MG tablet Take 1 tablet (25 mg total) by mouth daily. 02/27/20   [name], NP  spironolactone (ALDACTONE) 25 MG tablet Take 0.5 tablets (12.5 mg total) by mouth daily. 03/09/20 03/04/21  Donato Heinz, MD    Physical Exam: Vitals:   03/24/20 2130 03/24/20 2300 03/25/20 0000 03/25/20 0025  BP: 121/76 (!) 108/92 120/81  123/65  Pulse: 100   98  Resp: (!) 24 (!) 24 (!) 27 18  Temp:  98.2 F (36.8 C)  98.3 F (36.8 C)  TempSrc:    Oral  SpO2: 94% 99% 99% 99%  Weight:      Height:        Constitutional: Acute alert and oriented x3, no associated distress.   Skin: no rashes, no lesions, good skin turgor noted. Eyes: Pupils are equally reactive to light.  No evidence of scleral icterus or conjunctival pallor.  ENMT: Moist mucous membranes noted.  Posterior pharynx clear of any exudate or lesions.   Neck: normal, supple, no masses, no thyromegaly.  No evidence of jugular venous distension.   Respiratory: clear to auscultation bilaterally, no wheezing, no crackles. Normal respiratory effort. No accessory muscle use.  Cardiovascular: Faint bibasilar rales.  Regular rate and rhythm, no murmurs / rubs / gallops. No extremity edema. 2+ pedal pulses. No carotid bruits.  Chest:   Nontender without crepitus or deformity.   Back:   Nontender without crepitus or deformity. Abdomen: Abdomen is somewhat protuberant but soft and nontender.  No  evidence of intra-abdominal masses.  Positive bowel sounds noted in all quadrants.   Musculoskeletal: Notable deformity of the proximal right upper extremity.  Significant point tenderness of the proximal right upper extremity.  Significant pain with both passive and active range of motion of the right upper extremity.  Otherwise, joint deformity upper and lower extremities. Good ROM, no contractures. Normal muscle tone.  Neurologic: CN 2-12 grossly intact. Sensation intact.  Patient unable to move right upper extremity due to pain but otherwise moving all other extremities spontaneously.  Patient is following all commands.  Patient is responsive to verbal stimuli.   Psychiatric: Patient exhibits normal mood with appropriate affect.  Patient seems to possess insight as to their current situation.     Labs on Admission: I have personally reviewed following labs and imaging studies -    CBC: Recent Labs  Lab 03/24/20 1747  WBC 18.5*  NEUTROABS 13.7*  HGB 13.7  HCT 41.7  MCV 77.4*  PLT 331   Basic Metabolic Panel: Recent Labs  Lab 03/24/20 1747  NA 138  K 4.2  CL 102  CO2 24  GLUCOSE 112*  BUN 18  CREATININE 0.93  CALCIUM 9.4   GFR: Estimated Creatinine Clearance: 98.5 mL/min (by C-G formula based on SCr of 0.93 mg/dL). Liver Function Tests: Recent Labs  Lab 03/24/20 1747  AST 68*  ALT 83*  ALKPHOS 45  BILITOT 0.6  PROT 6.9  ALBUMIN 3.9   No results for input(s): LIPASE, AMYLASE in the last 168 hours. No results for input(s): AMMONIA in the last 168 hours. Coagulation Profile: Recent Labs  Lab 03/24/20 1747  INR 1.1   Cardiac Enzymes: No results for input(s): CKTOTAL, CKMB, CKMBINDEX, TROPONINI in the last 168 hours. BNP (last 3 results) No results for input(s): PROBNP in the last 8760 hours. HbA1C: Recent Labs    03/25/20 0014  HGBA1C 7.5*   CBG: Recent Labs  Lab 03/24/20 1750 03/24/20 1944  GLUCAP 112* 117*   Lipid Profile: No results for input(s): CHOL, HDL, LDLCALC, TRIG, CHOLHDL, LDLDIRECT in the last 72 hours. Thyroid Function Tests: No results for input(s): TSH, T4TOTAL, FREET4, T3FREE, THYROIDAB in the last 72 hours. Anemia Panel: No results for input(s): VITAMINB12, FOLATE, FERRITIN, TIBC, IRON, RETICCTPCT in the last 72 hours. Urine analysis:    Component Value Date/Time   COLORURINE YELLOW 03/25/2020 0014   APPEARANCEUR CLOUDY (A) 03/25/2020 0014   LABSPEC 1.016 03/25/2020 0014   PHURINE 7.0 03/25/2020 0014   GLUCOSEU NEGATIVE 03/25/2020 0014   HGBUR NEGATIVE 03/25/2020 0014   BILIRUBINUR NEGATIVE 03/25/2020 0014   KETONESUR NEGATIVE 03/25/2020 0014   PROTEINUR 100 (A) 03/25/2020 0014   NITRITE NEGATIVE 03/25/2020 0014   LEUKOCYTESUR NEGATIVE 03/25/2020 0014    Radiological Exams on Admission - Personally Reviewed: DG Chest 1 View  Result Date: 03/24/2020 CLINICAL DATA:  Fall EXAM: CHEST  1 VIEW  COMPARISON:  03/24/2020 FINDINGS: Mild cardiomegaly. No focal airspace disease or effusion. No pneumothorax. Incompletely visualized proximal right humerus fracture. IMPRESSION: Mild cardiomegaly.  Negative for edema or infiltrate. Electronically Signed   By: Jasmine Pang M.D.   On: 03/24/2020 23:56   DG Ribs Unilateral W/Chest Right  Result Date: 03/24/2020 CLINICAL DATA:  Right rib and shoulder pain EXAM: RIGHT RIBS AND CHEST - 3+ VIEW COMPARISON:  07/04/2019 FINDINGS: Single-view chest demonstrates an acute mildly displaced fracture involving the right humerus neck. Mild cardiomegaly. No focal consolidation or pleural effusion. No pneumothorax. Right rib series demonstrates no acute displaced  right rib fracture IMPRESSION: 1. Negative for right displaced rib fracture 2. Acute mildly displaced fracture involving proximal right humerus Electronically Signed   By: Jasmine Pang M.D.   On: 03/24/2020 19:12   DG Shoulder Right  Result Date: 03/24/2020 CLINICAL DATA:  Right shoulder pain EXAM: RIGHT SHOULDER - 2+ VIEW COMPARISON:  None. FINDINGS: AC joint is intact. Acute fracture involving right humeral neck and greater tuberosity with about 1/4 shaft diameter anterior displacement of distal fracture fragment. Mild valgus angulation of distal fracture fragment. The humeral head projects over the glenoid. IMPRESSION: Acute mildly displaced and angulated fracture involving the right humeral neck and greater tuberosity. Electronically Signed   By: Jasmine Pang M.D.   On: 03/24/2020 19:13   DG Knee 2 Views Right  Result Date: 03/24/2020 CLINICAL DATA:  Pain EXAM: RIGHT KNEE - 1-2 VIEW COMPARISON:  None. FINDINGS: No evidence of fracture, dislocation, or joint effusion. No evidence of arthropathy or other focal bone abnormality. Soft tissues are unremarkable. IMPRESSION: Negative. Electronically Signed   By: Katherine Mantle M.D.   On: 03/24/2020 20:47    EKG: Personally reviewed.  Rhythm is sinus  tachycardia with heart rate of 101 bpm.  Notable T wave inversions in the lateral leads.  No dynamic ST segment changes appreciated.  Assessment/Plan Principal Problem:   Fall at home, initial encounter   Patient has been experiencing frequent episodes of lightheadedness upon standing from a seated position for at least the past several weeks.  Seem to coincide with resumption of patient's medication regimen in December after patient went without medications for several months on his trip to [place]  It is possible that patient is no longer tolerating this regimen resulting in his symptoms and eventual fall earlier today  Alternatively, patient could be experiencing episodic cardiac arrhythmias due to progression of heart disease although this is less likely based on description of symptoms.  Temporarily holding diuretics for now  Resuming home antihypertensives however we will titrate these as blood pressure and symptoms tolerate.  Monitoring patient on telemetry  Obtaining orthostatic vital signs  Obtaining echocardiogram  cycling cardiac enzymes  Encouraging oral hydration  Active Problems:   Near syncope   Please see assessment and plan above    Closed fracture of right proximal humerus   Case discussed with Dr. [name] by emergency department provider who will evaluate the patient in the morning  Right upper extremity sling already provided  Orthopedics recommendations are appreciated  Obtaining vitamin D level    Uncontrolled type 2 diabetes mellitus with hyperglycemia, with long-term current use of insulin (HCC)  Accu-Cheks before every meal and nightly with sliding scale insulin  Hemoglobin A1c on this presentation shows a dramatic improvement compared to prior  Continuing basal bolus insulin regimen patient takes in the outpatient setting  Holding home regimen of Metformin for now    Coronary artery disease involving native coronary artery of native heart  without angina pectoris   Known extensive multivessel coronary artery disease  Patient has been experiencing exertional chest pain in the recent past according to cardiology notes but this seems to have improved since resuming his home medication regimen in December  Monitoring patient on telemetry  Patient currently chest pain-free  Will Consider cardiology consultation in the morning based overnight review of telemetry and echocardiogram results    Chronic combined systolic and diastolic congestive heart failure (HCC)   No evidence of cardiogenic volume overload at this time  `BNP normal  Nicotine dependence, cigarettes, uncomplicated    Counseling patient on cessation   Code Status:  Full code Family Communication: deferred   Status is: Observation  The patient remains OBS appropriate and will d/c before 2 midnights.  Dispo: The patient is from: Home              Anticipated d/c is to: Home              Anticipated d/c date is: 2 days              Patient currently is not medically stable to d/c.        Marinda Elk MD [facility] Pager [phone]  If 7PM-7AM, please contact night-coverage [email or web address] Use universal [place] password for that web site. If you do not have the password, please call the hospital operator.  03/25/2020, 12:55 AM

## 2020-03-25 NOTE — Progress Notes (Signed)
  Echocardiogram 2D Echocardiogram has been performed.  Larry Lewis Swaim 03/25/2020, 11:44 AM

## 2020-03-25 NOTE — ED Notes (Addendum)
Dietary gave pt a tray and pt ate 100% after being told he was to eat or drink nothing before surgery.

## 2020-03-25 NOTE — ED Notes (Signed)
Pt refused to change into hospital gown

## 2020-03-25 NOTE — Evaluation (Signed)
Physical Therapy Evaluation Patient Details Name: Larry Lewis MRN: [id number] DOB: February 03, 1966 Today's Date: 03/25/2020   History of Present Illness  55 year old male with past medical history of hypertension, asthma, coronary artery disease (cath with stent x 2 in 2007, cath 06/2019 with diffuse CAD - med mgmt), insulin-dependent diabetes mellitus type 2, systolic and diastolic congestive heart failure (Echo 06/2019 EF 35-40%), nicotine dependence who presents to [facility] emergency department status post fall complaining of right shoulder pain. Xray-- Acute mildly displaced and angulated fracture involving the right humeral neck and greater tuberosity.  Clinical Impression   Pt admitted with above diagnosis. Dizziness of unknown origin with cardiology consult pending. Patient slightly unsteady with drift to his left (assessed in ED hallway).  Pt currently with functional limitations due to the deficits listed below (see PT Problem List) and may benefit from skilled PT to increase their independence and safety with mobility to allow discharge to the venue listed below.       Follow Up Recommendations No PT follow up;Supervision for mobility/OOB    Equipment Recommendations  None recommended by PT    Recommendations for Other Services OT consult     Precautions / Restrictions Precautions Precautions: Fall Precaution Comments: h/o dizziness >6 months Restrictions Weight Bearing Restrictions:  (assumed NWB RUE (not ordered))      Mobility  Bed Mobility Overal bed mobility: Modified Independent             General bed mobility comments: patient with no orthostasis per nursing assessment (see vital signs)    Transfers Overall transfer level: Needs assistance Equipment used: None Transfers: Sit to/from Stand Sit to Stand: Supervision         General transfer comment: for safety; no imbalance and denied dizziness  Ambulation/Gait Ambulation/Gait assistance: Min  guard Gait Distance (Feet): 300 Feet Assistive device: None Gait Pattern/deviations: Step-through pattern;Decreased stride length;Drifts right/left     General Gait Details: mild drift to his left and then corrects  Stairs            Wheelchair Mobility    Modified Rankin (Stroke Patients Only)       Balance Overall balance assessment: Mild deficits observed, not formally tested                                           Pertinent Vitals/Pain Pain Assessment: Faces Faces Pain Scale: Hurts a little bit Pain Location: rt shoulder Pain Descriptors / Indicators: Constant Pain Intervention(s): Monitored during session    Home Living Family/patient expects to be discharged to:: Private residence Living Arrangements: Alone Available Help at Discharge: Friend(s)         Home Layout:  (fell down the stairs at home) Home Equipment: None      Prior Function Level of Independence: Independent               Hand Dominance   Dominant Hand: Left    Extremity/Trunk Assessment   Upper Extremity Assessment Upper Extremity Assessment:  (LUE WNL; RUE in sling)    Lower Extremity Assessment Lower Extremity Assessment: Overall WFL for tasks assessed    Cervical / Trunk Assessment Cervical / Trunk Assessment: Normal  Communication   Communication: Prefers language other than [place];Other (comment) (able to answer all basic questions in [place])  Cognition Arousal/Alertness: Awake/alert Behavior During Therapy: WFL for tasks assessed/performed Overall Cognitive Status: Within Functional  Limits for tasks assessed                                        General Comments      Exercises     Assessment/Plan    PT Assessment Patient needs continued PT services  PT Problem List Decreased activity tolerance;Decreased balance;Decreased mobility;Decreased knowledge of use of DME;Decreased knowledge of precautions;Pain       PT  Treatment Interventions DME instruction;Gait training;Functional mobility training;Stair training;Therapeutic activities;Therapeutic exercise;Balance training;Patient/family education    PT Goals (Current goals can be found in the Care Plan section)  Acute Rehab PT Goals Patient Stated Goal: to go home with friend's assist PT Goal Formulation: With patient Time For Goal Achievement: 04/08/20 Potential to Achieve Goals: Good    Frequency Min 3X/week   Barriers to discharge        Co-evaluation               AM-PAC PT "6 Clicks" Mobility  Outcome Measure Help needed turning from your back to your side while in a flat bed without using bedrails?: None Help needed moving from lying on your back to sitting on the side of a flat bed without using bedrails?: None Help needed moving to and from a bed to a chair (including a wheelchair)?: A Little Help needed standing up from a chair using your arms (e.g., wheelchair or bedside chair)?: A Little Help needed to walk in hospital room?: A Little Help needed climbing 3-5 steps with a railing? : A Little 6 Click Score: 20    End of Session Equipment Utilized During Treatment: Gait belt Activity Tolerance: Patient tolerated treatment well Patient left: in bed (in ED hallway bed) Nurse Communication: Mobility status PT Visit Diagnosis: Unsteadiness on feet (R26.81);History of falling (Z91.81)    Time: 8416-6063 PT Time Calculation (min) (ACUTE ONLY): 10 min   Charges:   PT Evaluation $PT Eval Low Complexity: 1 Low           Jerolyn Center, PT Pager [phone]   Zena Amos 03/25/2020, 3:09 PM

## 2020-03-26 ENCOUNTER — Observation Stay (HOSPITAL_COMMUNITY): Payer: Medicaid Other

## 2020-03-26 ENCOUNTER — Other Ambulatory Visit: Payer: Self-pay | Admitting: Medical

## 2020-03-26 DIAGNOSIS — I5022 Chronic systolic (congestive) heart failure: Secondary | ICD-10-CM

## 2020-03-26 DIAGNOSIS — R55 Syncope and collapse: Secondary | ICD-10-CM

## 2020-03-26 LAB — GLUCOSE, CAPILLARY
Glucose-Capillary: 147 mg/dL — ABNORMAL HIGH (ref 70–99)
Glucose-Capillary: 192 mg/dL — ABNORMAL HIGH (ref 70–99)

## 2020-03-26 MED ORDER — OXYCODONE-ACETAMINOPHEN 5-325 MG PO TABS: 1.0000 | ORAL_TABLET | ORAL | 0 refills | Status: AC | PRN

## 2020-03-26 MED ORDER — SPIRONOLACTONE 12.5 MG HALF TABLET
12.5000 mg | ORAL_TABLET | Freq: Every day | ORAL | Status: DC
  Administered 2020-03-26: 12.5 mg via ORAL
  Filled 2020-03-26: qty 1

## 2020-03-26 NOTE — Plan of Care (Signed)

## 2020-03-26 NOTE — Progress Notes (Signed)
SATURATION QUALIFICATIONS: (This note is used to comply with regulatory documentation for home oxygen)  Patient Saturations on Room Air at Rest = 91%  Patient Saturations on Room Air while Ambulating = 89%  Patient Saturations on n/a Liters of oxygen while Ambulating = n/a  Please briefly explain why patient needs home oxygen: n/a

## 2020-03-26 NOTE — Plan of Care (Signed)
  Problem: Education: °Goal: Knowledge of General Education information will improve °Description: Including pain rating scale, medication(s)/side effects and non-pharmacologic comfort measures °Outcome: Progressing °  °Problem: Clinical Measurements: °Goal: Ability to maintain clinical measurements within normal limits will improve °Outcome: Progressing °Goal: Will remain free from infection °Outcome: Progressing °Goal: Cardiovascular complication will be avoided °Outcome: Progressing °  °Problem: Activity: °Goal: Risk for activity intolerance will decrease °Outcome: Progressing °  °Problem: Nutrition: °Goal: Adequate nutrition will be maintained °Outcome: Progressing °  °

## 2020-03-26 NOTE — TOC Transition Note (Addendum)
Transition of Care [facility]) - CM/SW Discharge Note   Patient Details  Name: Larry Lewis MRN: [id number] Date of Birth: May 25, 1965  Transition of Care [facility]) CM/SW Contact:  Leone Haven, RN Phone Number: 03/26/2020, 4:41 PM   Clinical Narrative:    Patient has a coming up apt already with his PCP at the [facility] on 1/20 at 10:50 am.  He is for dc today. The only medication patient will be getting is oxycodone for pain.     Final next level of care: Home/Self Care Barriers to Discharge: No Barriers Identified   Patient Goals and CMS Choice        Discharge Placement                       Discharge Plan and Services                                     Social Determinants of Health (SDOH) Interventions     Readmission Risk Interventions No flowsheet data found.

## 2020-03-26 NOTE — Progress Notes (Signed)
     Subjective: 54yom with right proximal humerus fracture. Patient reports pain as moderate. Arm in sling. Denies CP, SOB, fever/chills, n/v, paraesthesia . Objective:   VITALS:   Vitals:   03/25/20 2340 03/26/20 0000 03/26/20 0400 03/26/20 0459  BP: 131/81 133/88 132/83   Pulse: (!) 103 (!) 103 (!) 103   Resp: 19 20 19    Temp: 99.3 F (37.4 C)  100.2 F (37.9 C)   TempSrc: Oral  Oral   SpO2: 93% 94% 92%   Weight:    85.8 kg  Height:       CBC Latest Ref Rng & Units 03/25/2020 03/24/2020 02/24/2020  WBC 4.0 - 10.5 K/uL 13.9(H) 18.5(H) 9.1  Hemoglobin 13.0 - 17.0 g/dL 14/08/2019 75.1 02.5  Hematocrit 39.0 - 52.0 % 42.1 41.7 47.5  Platelets 150 - 400 K/uL 299 331 304   BMP Latest Ref Rng & Units 03/25/2020 03/24/2020 03/09/2020  Glucose 70 - 99 mg/dL 03/11/2020) 778(E) 423(N)  BUN 6 - 20 mg/dL 16 18 11   Creatinine 0.61 - 1.24 mg/dL 361(W 4.31  BUN/Creat Ratio 9 - 20 - - 13  Sodium 135 - 145 mmol/L 139 138 140  Potassium 3.5 - 5.1 mmol/L 4.0 4.2 3.6  Chloride 98 - 111 mmol/L 103 102 99  CO2 22 - 32 mmol/L 23 24 27   Calcium 8.9 - 10.3 mg/dL 9.6 9.4 5.40   Intake/Output      01/05 0701 01/06 0700 01/06 0701 01/07 0700   Urine (mL/kg/hr) 475 (0.2)    Total Output 475    Net -475         Urine Occurrence 1 x      Physical Exam General: NAD.  Resting comfortably in bed. RLE: in sling, nvi  CT Right shoulder 03/25/20  IMPRESSION: Mildly displaced, anatomically aligned three-part fracture of the right humeral head with fracture planes involving the greater tuberosity, which appears mildly comminuted, and surgical neck of the humerus.   Assessment / Plan: Principal Problem:   Fall at home, initial encounter Active Problems:   Uncontrolled type 2 diabetes mellitus with hyperglycemia, with long-term current use of insulin (HCC)   Coronary artery disease involving native coronary artery of native heart without angina pectoris   Near syncope   Chronic combined systolic and  diastolic congestive heart failure (HCC)   Nicotine dependence, cigarettes, uncomplicated   Closed fracture of right proximal humerus  Fracture is near anatomical. Given this and his cardiac issues this can best be managed non-operative. Maintain in sling at all times except for hygiene  Incentive Spirometry  Apply ice as needed   Weightbearing: NWB RUE Orthopedic device(s): sling VTE prophylaxis: will defer to primary team/cardiology, SCDs, ambulation Pain control: percocet Follow - up plan: Dr. 03/06 in his office in 7-10 days for follow-up xrays and assessment.  Contact information:  05/23/20 MD, Margarita Rana PA-C   9-10, PA-C 03/26/2020, 7:59 AM

## 2020-03-26 NOTE — Evaluation (Addendum)
Occupational Therapy Evaluation Patient Details Name: Larry Lewis MRN: [id number] DOB: 1965-06-27 Today's Date: 03/26/2020    History of Present Illness 55 year old male with past medical history of hypertension, asthma, coronary artery disease (cath with stent x 2 in 2007, cath 06/2019 with diffuse CAD - med mgmt), insulin-dependent diabetes mellitus type 2, systolic and diastolic congestive heart failure (Echo 06/2019 EF 35-40%), nicotine dependence who presents to [facility] emergency department status post fall complaining of right shoulder pain. Xray-- Acute mildly displaced and angulated fracture involving the right humeral neck and greater tuberosity.   Clinical Impression   Pt admitted with the above diagnoses and presents with below problem list. Pt will benefit from continued acute OT to address the below listed deficits and maximize independence with basic ADLs prior to d/c home. At baseline pt is independent with basic ADLs. Pt currently needs up to min A with UB/LB ADLs. Min guard for transfers and min A for bed mobility. Began education regarding ADLs with shoulder precautions, use of sling, e/w/h ROM exercises (cleared to come out of sling for elbow ROM exercises per secure chat today with Daun Peacock, Orthopedic PA).      Follow Up Recommendations  Supervision - Intermittent    Equipment Recommendations  None recommended by OT    Recommendations for Other Services       Precautions / Restrictions Precautions Precautions: Fall, sling on at all times except bathing/dressing and e/w/h ROM exercises.  Precaution Comments: h/o dizziness >6 months Restrictions Weight Bearing Restrictions: Yes RUE Weight Bearing: Non weight bearing      Mobility Bed Mobility Overal bed mobility: Needs Assistance Bed Mobility: Supine to Sit;Sit to Supine     Supine to sit: Min assist Sit to supine: Supervision   General bed mobility comments: Pt reaching for HHA to pull up on  to powerup trunk. exited on pt's left side.    Transfers Overall transfer level: Needs assistance Equipment used: None Transfers: Sit to/from Stand Sit to Stand: Min guard         General transfer comment: min guard for safety. extra time and effort sitting down>standing up. Denied BLE pain but seemed to struggle a bit with B knee flexion.    Balance Overall balance assessment: Needs assistance Sitting-balance support: Feet supported;No upper extremity supported Sitting balance-Leahy Scale: Fair     Standing balance support: Single extremity supported;No upper extremity supported Standing balance-Leahy Scale: Fair Standing balance comment: occasionally seeks single extremity support in dynamic standing                           ADL either performed or assessed with clinical judgement   ADL Overall ADL's : Needs assistance/impaired Eating/Feeding: Set up;Sitting   Grooming: Set up;Minimal assistance Grooming Details (indicate cue type and reason): assist for bilateral overhead tasks Upper Body Bathing: Set up;Minimal assistance Upper Body Bathing Details (indicate cue type and reason): assist for bilateral overhead tasks such as washing hair Lower Body Bathing: Sit to/from stand;Minimal assistance   Upper Body Dressing : Set up;Minimal assistance;Sitting   Lower Body Dressing: Sit to/from stand;Minimal assistance   Toilet Transfer: Min guard   Toileting- Clothing Manipulation and Hygiene: Min guard   Tub/ Shower Transfer: Minimal assistance;Min guard   Functional mobility during ADLs: Min guard General ADL Comments: Up to min A with UB/LB ADLs, min guard for functional transfers and to walk in the room. Provided education around shoulder precautions and ADL techniques.  Vision         Perception     Praxis      Pertinent Vitals/Pain Pain Assessment: Faces Faces Pain Scale: Hurts even more Pain Location: rt shoulder especially with  mobility Pain Descriptors / Indicators: Grimacing;Guarding Pain Intervention(s): Limited activity within patient's tolerance;Monitored during session;Repositioned;Patient requesting pain meds-RN notified     Hand Dominance     Extremity/Trunk Assessment Upper Extremity Assessment Upper Extremity Assessment: LUE deficits/detail LUE Deficits / Details: increased shoulder pain with OOB activity. ROM e/w/h WNL. LUE: Unable to fully assess due to immobilization;Unable to fully assess due to pain LUE Coordination: WNL   Lower Extremity Assessment Lower Extremity Assessment: Defer to PT evaluation   Cervical / Trunk Assessment Cervical / Trunk Assessment: Normal   Communication Communication Communication: Prefers language other than [place];Other (comment) (able to answer all basic questions in [place])   Cognition Arousal/Alertness: Awake/alert Behavior During Therapy: Flat affect Overall Cognitive Status: No family/caregiver present to determine baseline cognitive functioning Area of Impairment: Safety/judgement;Problem solving                         Safety/Judgement: Decreased awareness of deficits   Problem Solving: Difficulty sequencing;Slow processing;Requires verbal cues;Requires tactile cues General Comments: possibly cultural barrier. Decreased problem solving? Answers with "I'll be ok" to questions regarding available assist at home and stairs, etc.   General Comments  BP sitting 114/73, standing 122/79, at rest SaO2 on RA in 90%O2, with ambulaton drops to 85%O2 with RR 28-30    Exercises Exercises: Other exercises Other Exercises Other Exercises: educated on e/w/h ROM exercises and provided handout.   Shoulder Instructions      Home Living Family/patient expects to be discharged to:: Private residence Living Arrangements: Non-relatives/Friends Available Help at Discharge: Friend(s) Type of Home: House (lives in the basement)                        Home Equipment: None          Prior Functioning/Environment Level of Independence: Independent                 OT Problem List: Impaired balance (sitting and/or standing);Decreased knowledge of use of DME or AE;Decreased knowledge of precautions;Impaired UE functional use;Pain      OT Treatment/Interventions: Therapeutic exercise;Self-care/ADL training;DME and/or AE instruction;Therapeutic activities;Patient/family education;Balance training    OT Goals(Current goals can be found in the care plan section) Acute Rehab OT Goals Patient Stated Goal: to go home with friend's assist OT Goal Formulation: With patient Time For Goal Achievement: 04/09/20 Potential to Achieve Goals: Good ADL Goals Pt Will Perform Upper Body Dressing: with set-up;with modified independence;sitting Pt Will Perform Lower Body Dressing: with set-up;with modified independence;sit to/from stand Pt Will Transfer to Toilet: with modified independence;ambulating Pt Will Perform Toileting - Clothing Manipulation and hygiene: with modified independence;sit to/from stand;sitting/lateral leans Pt/caregiver will Perform Home Exercise Program: With written HEP provided Additional ADL Goal #1: Pt will complete bed mobility at mod I level to prepare for OOB ADLs.  OT Frequency: Min 3X/week   Barriers to D/C:    limited available assist at home       Co-evaluation              AM-PAC OT "6 Clicks" Daily Activity     Outcome Measure Help from another person eating meals?: None Help from another person taking care of personal grooming?: A Little Help from another person  toileting, which includes using toliet, bedpan, or urinal?: A Little Help from another person bathing (including washing, rinsing, drying)?: A Little Help from another person to put on and taking off regular upper body clothing?: A Little Help from another person to put on and taking off regular lower body clothing?: A Little 6 Click  Score: 19   End of Session Equipment Utilized During Treatment: Other (comment) (sling) Nurse Communication: Patient requests pain meds  Activity Tolerance: Patient tolerated treatment well;Other (comment) (requesting pain med at end of session) Patient left: in bed;with call bell/phone within reach  OT Visit Diagnosis: Unsteadiness on feet (R26.81);Pain;History of falling (Z91.81)                Time: 9371-6967 OT Time Calculation (min): 21 min Charges:  OT General Charges $OT Visit: 1 Visit OT Evaluation $OT Eval Low Complexity: 1 Low  Raynald Kemp, OT Acute Rehabilitation Services Pager: [phone] Office: [phone]   Pilar Grammes 03/26/2020, 1:25 PM

## 2020-03-26 NOTE — Progress Notes (Signed)
Progress Note  Patient Name: Larry Lewis Date of Encounter: 03/26/2020  [facility] Cardiologist: Little Ishikawa, MD   Subjective   Pt denies dizzienss  No CP   Breathing is OK   Inpatient Medications    Scheduled Meds: . aspirin EC  81 mg Oral Daily  . atorvastatin  80 mg Oral Daily  . clopidogrel  75 mg Oral Daily  . enoxaparin (LOVENOX) injection  40 mg Subcutaneous Daily  . insulin aspart  0-15 Units Subcutaneous TID AC & HS  . insulin aspart  8 Units Subcutaneous TID WC  . insulin glargine  25 Units Subcutaneous BID  . losartan  25 mg Oral Daily  . metoprolol succinate  50 mg Oral Daily  . sertraline  25 mg Oral Daily  . sodium chloride flush  3 mL Intravenous Q12H   Continuous Infusions:  PRN Meds: acetaminophen **OR** acetaminophen, melatonin, oxyCODONE-acetaminophen **OR** morphine injection, nitroGLYCERIN, ondansetron **OR** ondansetron (ZOFRAN) IV, polyethylene glycol   Vital Signs    Vitals:   03/25/20 2340 03/26/20 0000 03/26/20 0400 03/26/20 0459  BP: 131/81 133/88 132/83   Pulse: (!) 103 (!) 103 (!) 103   Resp: 19 20 19    Temp: 99.3 F (37.4 C)  100.2 F (37.9 C)   TempSrc: Oral  Oral   SpO2: 93% 94% 92%   Weight:    85.8 kg  Height:        Intake/Output Summary (Last 24 hours) at 03/26/2020 0703 Last data filed at 03/26/2020 0400 Gross per 24 hour  Intake --  Output 475 ml  Net -475 ml   Last 3 Weights 03/26/2020 03/24/2020 03/17/2020  Weight (lbs) 189 lb 2.5 oz 194 lb 192 lb 3.2 oz  Weight (kg) 85.8 kg 87.998 kg 87.181 kg      Telemetry    sR  - Personally Reviewed    Physical Exam   GEN: No acute distress.   Neck: No JVD Cardiac: RRR, no murmurs Respiratory: Mild rhonchi GI: Soft, nontender, non-distended  MS: No edema; No deformity. Neuro:  Nonfocal  Psych: Normal affect   Labs    High Sensitivity Troponin:   Recent Labs  Lab 03/24/20 2019 03/25/20 0014  TROPONINIHS 9 9      Chemistry Recent Labs  Lab  03/24/20 1747 03/25/20 0056  NA 138 139  K 4.2 4.0  CL 102 103  CO2 24 23  GLUCOSE 112* 132*  BUN 18 16  CREATININE 0.93 0.85  CALCIUM 9.4 9.6  PROT 6.9 7.1  ALBUMIN 3.9 4.0  AST 68* 47*  ALT 83* 77*  ALKPHOS 45 49  BILITOT 0.6 0.6  GFRNONAA >60 >60  ANIONGAP 12 13     Hematology Recent Labs  Lab 03/24/20 1747 03/25/20 0241  WBC 18.5* 13.9*  RBC 5.39 5.34  HGB 13.7 13.1  HCT 41.7 42.1  MCV 77.4* 78.8*  MCH 25.4* 24.5*  MCHC 32.9 31.1  RDW 13.6 13.4  PLT 331 299    BNP Recent Labs  Lab 03/25/20 0014  BNP 67.8     DDimer No results for input(s): DDIMER in the last 168 hours.   Radiology    DG Chest 1 View  Result Date: 03/24/2020 CLINICAL DATA:  Fall EXAM: CHEST  1 VIEW COMPARISON:  03/24/2020 FINDINGS: Mild cardiomegaly. No focal airspace disease or effusion. No pneumothorax. Incompletely visualized proximal right humerus fracture. IMPRESSION: Mild cardiomegaly.  Negative for edema or infiltrate. Electronically Signed   By: 05/22/2020.D.  On: 03/24/2020 23:56   DG Ribs Unilateral W/Chest Right  Result Date: 03/24/2020 CLINICAL DATA:  Right rib and shoulder pain EXAM: RIGHT RIBS AND CHEST - 3+ VIEW COMPARISON:  07/04/2019 FINDINGS: Single-view chest demonstrates an acute mildly displaced fracture involving the right humerus neck. Mild cardiomegaly. No focal consolidation or pleural effusion. No pneumothorax. Right rib series demonstrates no acute displaced right rib fracture IMPRESSION: 1. Negative for right displaced rib fracture 2. Acute mildly displaced fracture involving proximal right humerus Electronically Signed   By: Jasmine Pang M.D.   On: 03/24/2020 19:12   DG Shoulder Right  Result Date: 03/24/2020 CLINICAL DATA:  Right shoulder pain EXAM: RIGHT SHOULDER - 2+ VIEW COMPARISON:  None. FINDINGS: AC joint is intact. Acute fracture involving right humeral neck and greater tuberosity with about 1/4 shaft diameter anterior displacement of distal  fracture fragment. Mild valgus angulation of distal fracture fragment. The humeral head projects over the glenoid. IMPRESSION: Acute mildly displaced and angulated fracture involving the right humeral neck and greater tuberosity. Electronically Signed   By: Jasmine Pang M.D.   On: 03/24/2020 19:13   DG Knee 2 Views Right  Result Date: 03/24/2020 CLINICAL DATA:  Pain EXAM: RIGHT KNEE - 1-2 VIEW COMPARISON:  None. FINDINGS: No evidence of fracture, dislocation, or joint effusion. No evidence of arthropathy or other focal bone abnormality. Soft tissues are unremarkable. IMPRESSION: Negative. Electronically Signed   By: Katherine Mantle M.D.   On: 03/24/2020 20:47   CT SHOULDER RIGHT WO CONTRAST  Result Date: 03/26/2020 CLINICAL DATA:  Right shoulder fracture EXAM: CT OF THE UPPER RIGHT EXTREMITY WITHOUT CONTRAST TECHNIQUE: Multidetector CT imaging of the upper right extremity was performed according to the standard protocol. COMPARISON:  Plain radiographs 03/24/2020 FINDINGS: Bones/Joint/Cartilage There is an acute fracture of the a right humeral head. Fracture planes are seen involving the surgical neck of the humerus with approximately 2 cortical widths medial displacement of the a shaft as well as the greater tuberosity which appears mildly comminuted, but in grossly anatomic alignment. Glenohumeral articulation is preserved. The visualized scapula and clavicle are intact. Tiny focus of calcification is seen adjacent to the coracoid process likely reflecting the sequela of remote trauma or inflammation involving the short head biceps insertion. Visualized right ribs appear intact. Ligaments Suboptimally assessed by CT. Muscles and Tendons Unremarkable Soft tissues Small right shoulder effusion.  Right lower lobe atelectasis noted. IMPRESSION: Mildly displaced, anatomically aligned three-part fracture of the right humeral head with fracture planes involving the greater tuberosity, which appears mildly  comminuted, and surgical neck of the humerus. Electronically Signed   By: Helyn Numbers MD   On: 03/26/2020 05:11   ECHOCARDIOGRAM COMPLETE  Result Date: 03/25/2020    ECHOCARDIOGRAM REPORT   Patient Name:   Larry Lewis Date of Exam: 03/25/2020 Medical Rec #:  [id number]   Height:       69.0 in Accession #:    [id number]  Weight:       194.0 lb Date of Birth:  27-Jul-1965    BSA:          2.039 m Patient Age:    54 years    BP:           124/81 mmHg Patient Gender: M           HR:           101 bpm. Exam Location:  Inpatient Procedure: 2D Echo, Cardiac Doppler and Color Doppler Indications:    Syncope  780.2 / R55  History:        Patient has prior history of Echocardiogram examinations, most                 recent 07/06/2019. Acute MI and CAD; Risk Factors:Hypertension,                 Dyslipidemia and Current Smoker.  Sonographer:    Renella Cunas RDCS Referring Phys: [id number] Deno Lunger [facility] IMPRESSIONS  1. Left ventricular ejection fraction, by estimation, is 45 to 50%. The left ventricle has mildly decreased function. The left ventricle demonstrates global hypokinesis. Left ventricular diastolic parameters are consistent with Grade I diastolic dysfunction (impaired relaxation).  2. Right ventricular systolic function is normal. The right ventricular size is normal. Tricuspid regurgitation signal is inadequate for assessing PA pressure.  3. The mitral valve is normal in structure. No evidence of mitral valve regurgitation. No evidence of mitral stenosis.  4. The aortic valve is normal in structure. Aortic valve regurgitation is not visualized. No aortic stenosis is present.  5. The inferior vena cava is normal in size with greater than 50% respiratory variability, suggesting right atrial pressure of 3 mmHg. FINDINGS  Left Ventricle: Left ventricular ejection fraction, by estimation, is 45 to 50%. The left ventricle has mildly decreased function. The left ventricle demonstrates global hypokinesis. The left  ventricular internal cavity size was normal in size. There is  no left ventricular hypertrophy. Left ventricular diastolic parameters are consistent with Grade I diastolic dysfunction (impaired relaxation). Normal left ventricular filling pressure. Right Ventricle: The right ventricular size is normal. No increase in right ventricular wall thickness. Right ventricular systolic function is normal. Tricuspid regurgitation signal is inadequate for assessing PA pressure. Left Atrium: Left atrial size was normal in size. Right Atrium: Right atrial size was normal in size. Pericardium: There is no evidence of pericardial effusion. Mitral Valve: The mitral valve is normal in structure. Mild mitral annular calcification. No evidence of mitral valve regurgitation. No evidence of mitral valve stenosis. Tricuspid Valve: The tricuspid valve is normal in structure. Tricuspid valve regurgitation is trivial. No evidence of tricuspid stenosis. Aortic Valve: The aortic valve is normal in structure. Aortic valve regurgitation is not visualized. No aortic stenosis is present. Pulmonic Valve: The pulmonic valve was normal in structure. Pulmonic valve regurgitation is not visualized. No evidence of pulmonic stenosis. Aorta: The aortic root is normal in size and structure. Venous: The inferior vena cava is normal in size with greater than 50% respiratory variability, suggesting right atrial pressure of 3 mmHg. IAS/Shunts: No atrial level shunt detected by color flow Doppler.  LEFT VENTRICLE PLAX 2D LVOT diam:     2.20 cm      Diastology LV SV:         51           LV e' medial:    4.47 cm/s LV SV Index:   25           LV E/e' medial:  17.2 LVOT Area:     3.80 cm     LV e' lateral:   9.37 cm/s                             LV E/e' lateral: 8.2  LV Volumes (MOD) LV vol d, MOD A2C: 159.0 ml LV vol d, MOD A4C: 159.0 ml LV vol s, MOD A2C: 82.4 ml LV vol s, MOD A4C: 105.0 ml  LV SV MOD A2C:     76.6 ml LV SV MOD A4C:     159.0 ml LV SV MOD BP:       67.1 ml RIGHT VENTRICLE RV S prime:     13.10 cm/s TAPSE (M-mode): 2.0 cm LEFT ATRIUM             Index       RIGHT ATRIUM           Index LA Vol (A2C):   53.3 ml 26.13 ml/m RA Area:     17.70 cm LA Vol (A4C):   56.6 ml 27.75 ml/m RA Volume:   41.70 ml  20.45 ml/m LA Biplane Vol: 56.6 ml 27.75 ml/m  AORTIC VALVE LVOT Vmax:   79.80 cm/s LVOT Vmean:  55.500 cm/s LVOT VTI:    0.134 m  AORTA Ao Asc diam: 3.40 cm MITRAL VALVE MV Area (PHT): 7.99 cm     SHUNTS MV Decel Time: 95 msec      Systemic VTI:  0.13 m MV E velocity: 77.10 cm/s   Systemic Diam: 2.20 cm MV A velocity: 101.00 cm/s MV E/A ratio:  0.76 Fransico Him MD Electronically signed by Fransico Him MD Signature Date/Time: 03/25/2020/12:34:17 PM    Final     Cardiac Studies   Echo 03/25/20  1. Left ventricular ejection fraction, by estimation, is 45 to 50%. The left ventricle has mildly decreased function. The left ventricle demonstrates global hypokinesis. Left ventricular diastolic parameters are consistent with Grade I diastolic dysfunction (impaired relaxation). 2. Right ventricular systolic function is normal. The right ventricular size is normal. Tricuspid regurgitation signal is inadequate for assessing PA pressure. 3. The mitral valve is normal in structure. No evidence of mitral valve regurgitation. No evidence of mitral stenosis. 4. The aortic valve is normal in structure. Aortic valve regurgitation is not visualized. No aortic stenosis is present. 5. The inferior vena cava is normal in size with greater than 50% respiratory variability, suggesting right atrial pressure of 3 mmHg.  Patient Profile        Larry Lewis is a 55 y.o. male with a hx of severe multivessel disease, DM, HTN, HLD, asthma, tobacco use, and chronic systolic and diastolic heart failure who is being seen today for the evaluation of near syncope at the request of Dr. Ree Kida.   Assessment & Plan    1  Dizziness/near syncope  No arrhythmias  Not  orthostatic on my check   Lounging to standing  BP / HR on my check today   115/70   P stayed in 90s  Follow     2 Chronic systolic CHF     Echo as noted above   LVEF mildly down   Woulk keep on losartan and toprol.  Would add back low dose spironolactone 12.5 mg     Volume status appears  Ok    Will follow as outpt.    3  CAD   Severe mutivessel CAD   No evid for active angina  4.  Pulmonary    Pt's O2 sat decreased to 85% with walking   Hx tobacco use   And, he had gotten pain med prior. PT reported him  to be lethargic while walking  Does not have O2 at home      For questions or updates, please contact [facility] Please consult [email or web address] for contact info under        Signed, Dorris Carnes, MD  03/26/2020, 7:03 AM

## 2020-03-26 NOTE — TOC Progression Note (Signed)
Transition of Care [facility]) - Progression Note    Patient Details  Name: Larry Lewis MRN: [id number] Date of Birth: October 06, 1965  Transition of Care [facility]) CM/SW Contact  Leone Haven, RN Phone Number: 03/26/2020, 4:04 PM  Clinical Narrative:    Patient has a coming up apt already with his PCP at the [facility] on 1/20 at 10:50 am.  He is for possible dc.  TOC will continue to follow for dc needs.         Expected Discharge Plan and Services           Expected Discharge Date: 03/26/20                                     Social Determinants of Health (SDOH) Interventions    Readmission Risk Interventions No flowsheet data found.

## 2020-03-26 NOTE — Discharge Instructions (Signed)
Near-Syncope Near-syncope is when you suddenly get weak or dizzy, or you feel like you might pass out (faint). This may also be called presyncope. This is due to a lack of blood flow to the brain. During an episode of near-syncope, you may:  Feel dizzy, weak, or light-headed.  Feel sick to your stomach (nauseous).  See all white or all black.  See spots.  Have cold, clammy skin. This condition is caused by a sudden decrease in blood flow to the brain. This decrease can result from various causes, but most of those causes are not dangerous. However, near-syncope may be a sign of a serious medical problem, so it is important to seek medical care. Follow these instructions at home: Medicines  Take over-the-counter and prescription medicines only as told by your doctor.  If you are taking blood pressure or heart medicine, get up slowly and spend many minutes getting ready to sit and then stand. This can help with dizziness. General instructions  Be aware of any changes in your symptoms.  Talk with your doctor about your symptoms. You may need to have testing to find the cause of your near-syncope.  If you start to feel like you might pass out, lie down right away. Raise (elevate) your feet above the level of your heart. Breathe deeply and steadily. Wait until all of the symptoms are gone.  Have someone stay with you until you feel stable.  Do not drive, use machinery, or play sports until your doctor says it is okay.  Drink enough fluid to keep your pee (urine) pale yellow.  Keep all follow-up visits as told by your doctor. This is important. Get help right away if you:  Have a seizure.  Have pain in your: ? Chest. ? Belly (abdomen). ? Back.  Faint once or more than once.  Have a very bad headache.  Are bleeding from your mouth or butt.  Have black or tarry poop (stool).  Have a very fast or uneven heartbeat (palpitations).  Are mixed up (confused).  Have trouble  walking.  Are very weak.  Have trouble seeing. These symptoms may be an emergency. Do not wait to see if the symptoms will go away. Get medical help right away. Call your local emergency services (911 in the [place] Do not drive yourself to the hospital. Summary  Near-syncope is when you suddenly get weak or dizzy, or you feel like you might pass out (faint).  This condition is caused by a lack of blood flow to the brain.  Near-syncope may be a sign of a serious medical problem, so it is important to seek medical care. This information is not intended to replace advice given to you by your health care provider. Make sure you discuss any questions you have with your health care provider. Document Revised: 06/29/2018 Document Reviewed: 01/24/2018 Elsevier Patient Education  2020 [employer] Humerus Fracture Treated With Immobilization  The humerus is the large bone in the upper arm. A broken (fractured) humerus is often treated by wearing a cast, splint, or sling (immobilization). This holds the broken pieces in place so they can heal. What are the causes? This condition may be caused by:  A fall.  A hard, direct hit to the arm.  A car accident. What increases the risk? You are more likely to develop this condition if:  You are elderly.  You have a disease that makes the bones thin and weak. What are the signs or symptoms?  Pain.  Swelling.  Bruising.  Not being able to move your arm normally. How is this treated? Treatment involves wearing a cast, splint, or sling until your arm heals enough for you to begin range-of-motion exercises. You may also be prescribed pain medicine. Follow these instructions at home: If you have a cast:  Do not stick anything inside the cast to scratch your skin.  Check the skin around the cast every day. Tell your doctor if you have any concerns.  You may put lotion on dry skin around the edges of the cast. Do not put lotion on the skin  under the cast.  Keep the cast clean and dry. If you have a splint or sling:  Wear the splint or sling as told by your doctor. Remove it only as told by your doctor.  Loosen the splint or sling if your fingers: ? Tingle. ? Become numb. ? Turn cold and blue.  Keep the splint or sling clean and dry. Bathing  Do not take baths, swim, or use a hot tub until your doctor says that you can. Ask your doctor if you may take showers. You may only be allowed to take sponge baths.  If your cast, splint, or sling is not waterproof: ? Do not let it get wet. ? Cover it with a watertight covering when you take a bath or shower.  If you have a sling, remove it for bathing only if your doctor says this is okay. Managing pain, stiffness, and swelling   If told, put ice on the injured area. ? If you have a removable splint or sling, remove it as told by your doctor. ? Put ice in a plastic bag. ? Place a towel between your skin and the bag or between your cast and the bag. ? Leave the ice on for 20 minutes, 2-3 times a day.  Move your fingers often.  Raise (elevate) the injured area above the level of your heart while you are sitting or lying down. Driving  Do not drive or use heavy machinery while taking prescription pain medicine.  Do not drive while wearing a cast, splint, or sling on an arm that you use for driving. Activity  Return to your normal activities as told by your doctor. Ask your doctor what activities are safe for you.  Do not lift anything until your doctor says that it is safe.  Do range-of-motion exercises only as told by your doctor. General instructions  Do not put pressure on any part of the cast or splint until it is fully hardened. This may take many hours.  Do not use any products that contain nicotine or tobacco, such as cigarettes, e-cigarettes, and chewing tobacco. These can delay bone healing. If you need help quitting, ask your doctor.  Take  over-the-counter and prescription medicines only as told by your doctor.  Ask your doctor if the medicine you are taking can cause trouble pooping (constipation). You may need to take steps to prevent or treat trouble pooping: ? Drink enough fluid to keep your pee (urine) pale yellow. ? Take over-the-counter or prescription medicines. ? Eat foods that are high in fiber. These include beans, whole grains, and fresh fruits and vegetables. ? Limit foods that are high in fat and sugar. These include fried or sweet foods.  Keep all follow-up visits as told by your doctor. This is important. Contact a doctor if:  You have any new pain, swelling, or bruising.  Your pain, swelling, and bruising do  not get better.  Your cast, splint, or sling becomes loose or damaged. Get help right away if:  Your skin or fingers on your injured arm turn blue or gray.  Your arm is cold or numb.  You have very bad pain in your injured arm. Summary  The humerus is the large bone in the upper arm.  A broken humerus is often treated by wearing a cast, splint, or sling.  Wear a splint or sling as told by your doctor. Remove it only as told by your doctor.  Move your fingers often. This information is not intended to replace advice given to you by your health care provider. Make sure you discuss any questions you have with your health care provider. Document Revised: 11/06/2017 Document Reviewed: 11/06/2017 Elsevier Patient Education  [place].

## 2020-03-26 NOTE — Progress Notes (Signed)
Physical Therapy Treatment Patient Details Name: Larry Lewis MRN: [id number] DOB: 08/13/1965 Today's Date: 03/26/2020    History of Present Illness 55 year old male with past medical history of hypertension, asthma, coronary artery disease (cath with stent x 2 in 2007, cath 06/2019 with diffuse CAD - med mgmt), insulin-dependent diabetes mellitus type 2, systolic and diastolic congestive heart failure (Echo 06/2019 EF 35-40%), nicotine dependence who presents to [facility] emergency department status post fall complaining of right shoulder pain. Xray-- Acute mildly displaced and angulated fracture involving the right humeral neck and greater tuberosity.    PT Comments    Pt with increased difficulty with mobility today, possibly due to pain medication. Pt is requiring min A for bed mobility, transfers and ambulation without AD. Pt SaO2 on RA 94%O2 RR 16 with ambulation SaO2 drops to 85%O2 and RR 30. Pt will not answer any direct questions about pain but has increased grimace indicating 8/10 pain with bed mobility and transfers.  Due to increased instability, deferred stair training and only provided verbal and visual education on how to safely ascend/descend stairs with shoulder in sling. Notified RN and MD of findings with mobilization.    Follow Up Recommendations  No PT follow up;Supervision for mobility/OOB     Equipment Recommendations  None recommended by PT    Recommendations for Other Services OT consult     Precautions / Restrictions Precautions Precautions: Fall Precaution Comments: h/o dizziness >6 months Restrictions Weight Bearing Restrictions: Yes RUE Weight Bearing: Non weight bearing    Mobility  Bed Mobility Overal bed mobility: Needs Assistance Bed Mobility: Supine to Sit     Supine to sit: Min assist     General bed mobility comments: min A for blocking knees as he tries to come to upright with sling in place he scoots too close to EoB, able to self  scoot hips back into bed with cuing  Transfers Overall transfer level: Needs assistance Equipment used: None Transfers: Sit to/from Stand Sit to Stand: Min assist         General transfer comment: min A for blocking knees with power up and steadying in standing, once up able to static stand for BP  Ambulation/Gait Ambulation/Gait assistance: Min guard;Min assist Gait Distance (Feet): 50 Feet Assistive device: None Gait Pattern/deviations: Step-through pattern;Decreased stride length;Drifts right/left Gait velocity: slowed Gait velocity interpretation: <1.8 ft/sec, indicate of risk for recurrent falls General Gait Details: uneven gait, drifting R and L in hallway 2x LoB requiring light min A for steadying   Stairs Stairs:  (deferred due to decreased safety with ambulation, provided education and demonstration for use of handrail in his home to get to his room in the basement)                 Balance Overall balance assessment: Needs assistance Sitting-balance support: Feet supported;No upper extremity supported Sitting balance-Leahy Scale: Fair     Standing balance support: Single extremity supported;No upper extremity supported Standing balance-Leahy Scale: Fair Standing balance comment: would not be able to withstand any perturbance                            Cognition Arousal/Alertness: Lethargic;Suspect due to medications Behavior During Therapy: Flat affect Overall Cognitive Status: Impaired/Different from baseline Area of Impairment: Safety/judgement;Problem solving                         Safety/Judgement: Decreased awareness  of deficits   Problem Solving: Difficulty sequencing;Slow processing;Requires verbal cues;Requires tactile cues General Comments: possibly cultural but pt will not answer direct questions about pain, just saying over and over again "I'll be okay"      Exercises      General Comments General comments (skin  integrity, edema, etc.): BP sitting 114/73, standing 122/79, at rest SaO2 on RA in 90%O2, with ambulaton drops to 85%O2 with RR 28-30      Pertinent Vitals/Pain Pain Assessment: Faces Faces Pain Scale: Hurts whole lot Pain Location: rt shoulder especially with mobility Pain Descriptors / Indicators: Constant;Grimacing;Guarding;Moaning Pain Intervention(s): Limited activity within patient's tolerance;Monitored during session;Repositioned    Home Living Family/patient expects to be discharged to:: Private residence Living Arrangements: Non-relatives/Friends Available Help at Discharge: Friend(s)                    PT Goals (current goals can now be found in the care plan section) Acute Rehab PT Goals Patient Stated Goal: to go home with friend's assist PT Goal Formulation: With patient Time For Goal Achievement: 04/08/20 Potential to Achieve Goals: Good Progress towards PT goals: Not progressing toward goals - comment (possibly due to pain medication, currently requiring min A for stabilizing)    Frequency    Min 3X/week      PT Plan Other (comment);Current plan remains appropriate (Concern for d/c today with increased instability)       AM-PAC PT "6 Clicks" Mobility   Outcome Measure  Help needed turning from your back to your side while in a flat bed without using bedrails?: None Help needed moving from lying on your back to sitting on the side of a flat bed without using bedrails?: None Help needed moving to and from a bed to a chair (including a wheelchair)?: A Little Help needed standing up from a chair using your arms (e.g., wheelchair or bedside chair)?: A Little Help needed to walk in hospital room?: A Little Help needed climbing 3-5 steps with a railing? : A Little 6 Click Score: 20    End of Session Equipment Utilized During Treatment: Gait belt Activity Tolerance: Patient tolerated treatment well Patient left: in chair;with call bell/phone within reach  (in ED hallway bed) Nurse Communication: Mobility status;Other (comment) (drop in O2, increased instability with gait) PT Visit Diagnosis: Unsteadiness on feet (R26.81);History of falling (Z91.81)     Time: 6720-9470 PT Time Calculation (min) (ACUTE ONLY): 24 min  Charges:  $Gait Training: 23-37 mins                     Riggins Cisek B. Beverely Risen PT, DPT Acute Rehabilitation Services Pager [phone] Office [phone]    Elon Alas Fleet 03/26/2020, 1:05 PM

## 2020-03-26 NOTE — Discharge Summary (Signed)
Physician Discharge Summary  Larry Lewis EGB:[id number] DOB: [birth year] DOA: 03/24/2020  PCP: [name], NP  Admit date: 03/24/2020 Discharge date: 03/26/2020  Time spent: 45 minutes  Recommendations for Outpatient Follow-up:  Patient will be discharged to home.  Patient will need to follow up with primary care provider within one week of discharge.  Follow up with Dr. Fredonia Highland, orthopedics in one week. Follow up with cardiology. Patient should continue medications as prescribed.  Patient should follow a heart healthy/carb modified diet.   Discharge Diagnoses:  Near syncope with fall Closed fracture of right proximal humerus Diabetes mellitus, type II uncontrolled with hyperglycemia Coronary disease Chronic combined systolic and diastolic heart failure Nicotine dependence  Discharge Condition: stable  Diet recommendation: heart healthy/carb modified   Filed Weights   03/24/20 2117 03/26/20 0459  Weight: 88 kg 85.8 kg    History of present illness:  HPI on 03/24/2020 by Dr. Inda Merlin 55 year old male with past medical history of hypertension, asthma, coronary artery disease(cath with stent x 2 in 2007, cath 06/2019 with diffuse CAD - med mgmt),insulin-dependent diabetes mellitus type 2, systolic and diastolic congestive heart failure(Echo 06/2019 EF 35-40%),nicotine dependence who presents to [facility] emergency department status post fall complaining of right shoulder pain.  Patient explains that in the past several weeks he has been experiencing significant lightheadedness that he describes as "dizziness", primarily upon rising from a seated position. Symptoms sound like the began sometime in December. It turns out that patient was not taking any medications for nearly 6 months in 2021 after going to [place] to care for his ill parents (who have now unfortunately expired in 11/2019).He ran out of medications while there and went several months without  medications before returning back to [place] and being restarted on his medication regimen in December. Per review of cardiology notes, patient was complaining of multiple symptoms including weight gain, dyspnea on exertion and exertional chest pain.  At this time, patient states that his exertional chest pain and exertional dyspnea have improved. However, patient is now experiencing significant lightheadedness since resuming his medications.  At approximately 3 PM on 1/4, the patient was at his friend's restaurant walking down the stairs from the second floor to the first he suddenly had an episode of lightheadedness, similar to what is mentioned above.While he did not completely lose consciousness, he fell down the stairs and suddenly began to experience severe right shoulder pain. Pain was 10 out of 10 in intensity, sharp in quality, nonradiating and worse with any attempt to move the right upper extremity. EMS was contacted and the patient was brought into [facility] emergency department for evaluation.  Upon evaluation at the [facility] emergency department, x-ray of the right upper extremity revealed a proximal right humeral neck fracture with some displacement and angulation. Emergency department divider discussed the case with Dr. Percell Miller with orthopedic surgery who stated that they will consult on the patient in the morning. Patient has been fitted with a arm immobilizer while in the emergency department. Patient's case is also been reviewed with Dr. Sallyanne Kuster withcardiology.According the emergency department provider, cardiology feels there is no significant change in the patient's EKG today compared to prior and that cardiology can be formally consulted in the morning if felt indicated by the primary service. Patient received several doses of intravenous morphine for pain control. The hospitalist group was then called to assess patient for admission the  hospital.   Hospital Course:  Near syncope  with fall -Patient has been experiencing frequent episodes of lightheadedness upon standing from a seated position which has been occurring for the past several weeks -Suspect secondary to patient's cardiac medications given that he was off of them for several months during his trip to [place] -Echocardiogram EF of 45 to 50%, grade 1 diastolic dysfunction -Orthostatic vitals unremarkable -Cardiology consulted and appreciated- will likely need event monitor- to be set up as an outpatient  Closed fracture of right proximal humerus -Secondary to fall -Orthopedics consulted and appreciated- patient to follow up with Dr. Renaye Rakers -Continue pain control -Arm currently in sling  Diabetes mellitus, type II uncontrolled with hyperglycemia -Hemoglobin A1c 7.5 -Metformin held- continue on discharge -Continue Lantus, insulin sliding scale and CBG monitoring  Coronary disease -No complaints of chest pain at this time -Patient with known extensive multivessel coronary disease -Cardiology consulted and appreciated -Continue aspirin, Plavix, statin  Chronic combined systolic and diastolic heart failure -BNP appears to be normal -Patient appears to be euvolemic and compensated -Echocardiogram obtained for new syncope- results as above -diuretics held on admission -Monitor intake and output, daily weights -Discussed with cardiology, Dr. Tenny Craw, recommended continuing losartan, toprol, spironolactone 12.5mg    Nicotine dependence -Smoking cessation discussed  Procedures: Echocardiogram  Consultations: Orthopedic surgery Cardiology  Discharge Exam: Vitals:   03/26/20 0400 03/26/20 1120  BP: 132/83 115/67  Pulse: (!) 103 94  Resp: 19 19  Temp: 100.2 F (37.9 C) 97.7 F (36.5 C)  SpO2: 92% 90%     General: Well developed, well nourished, NAD, appears stated age  HEENT: NCAT, mucous membranes moist.  Cardiovascular: S1 S2  auscultated, RRR, soft SEM.  Respiratory: Clear to auscultation bilaterally with equal chest rise  Abdomen: Soft, nontender, nondistended, + bowel sounds  Extremities: warm dry without cyanosis clubbing or edema. RUE in sling  Neuro: AAOx3, nonfocal  Psych: appropriate mood and affect, pleasant  Discharge Instructions Discharge Instructions    Discharge instructions   Complete by: As directed    Patient will be discharged to home.  Patient will need to follow up with primary care provider within one week of discharge.  Follow up with Dr. Renaye Rakers, orthopedics in one week. Follow up with cardiology. Patient should continue medications as prescribed.  Patient should follow a heart healthy/carb modified diet.   Increase activity slowly   Complete by: As directed      Allergies as of 03/26/2020      Reactions   Penicillins Itching      Medication List    STOP taking these medications   furosemide 20 MG tablet Commonly known as: LASIX     TAKE these medications   aspirin EC 81 MG tablet Take 81 mg by mouth daily. Swallow whole.   atorvastatin 80 MG tablet Commonly known as: LIPITOR Take 1 tablet (80 mg total) by mouth daily.   clopidogrel 75 MG tablet Commonly known as: PLAVIX Take 1 tablet (75 mg total) by mouth daily.   insulin aspart 100 UNIT/ML FlexPen Commonly known as: NOVOLOG Inject 8 Units into the skin 3 (three) times daily with meals. Defer refills to PCP   insulin glargine 100 UNIT/ML Solostar Pen Commonly known as: LANTUS Inject 25 Units into the skin 2 (two) times daily. Defer refills to PCP   losartan 25 MG tablet Commonly known as: COZAAR Take 1 tablet (25 mg total) by mouth daily.   metFORMIN 1000 MG tablet Commonly known as: GLUCOPHAGE Take 1 tablet (1,000 mg total) by mouth 2 (  two) times daily with a meal. Defer refills to PCP   metoprolol succinate 50 MG 24 hr tablet Commonly known as: TOPROL-XL Take 1 tablet (50 mg total) by mouth daily.    nitroGLYCERIN 0.4 MG SL tablet Commonly known as: NITROSTAT Place 1 tablet (0.4 mg total) under the tongue every 5 (five) minutes x 3 doses as needed for chest pain.   oxyCODONE-acetaminophen 5-325 MG tablet Commonly known as: PERCOCET/ROXICET Take 1 tablet by mouth every 4 (four) hours as needed for up to 7 days for moderate pain.   sertraline 25 MG tablet Commonly known as: Zoloft Take 1 tablet (25 mg total) by mouth daily.   spironolactone 25 MG tablet Commonly known as: ALDACTONE Take 0.5 tablets (12.5 mg total) by mouth daily.      Allergies  Allergen Reactions  . Penicillins Itching    Follow-up Information    Little Ishikawa, MD Follow up on 04/13/2020.   Specialties: Cardiology, Radiology Why: Please arrive 15 minutes early for your 11:40am post-hospital cardiology appointment Contact information: [address] [place] [address] [phone]        [facility] Follow up.   Specialty: Family Medicine Why: plese call to schedule a follow up apt  Contact information: [address] [place] [facility] [address] [phone]       Sheral Apley, MD. Schedule an appointment as soon as possible for a visit in 1 week(s).   Specialty: Orthopedic Surgery Why: Hospital follow up for arm fracture. Contact information: [address] [place] [address] [phone]                The results of significant diagnostics from this hospitalization (including imaging, microbiology, ancillary and laboratory) are listed below for reference.    Significant Diagnostic Studies: DG Chest 1 View  Result Date: 03/24/2020 CLINICAL DATA:  Fall EXAM: CHEST  1 VIEW COMPARISON:  03/24/2020 FINDINGS: Mild cardiomegaly. No focal airspace disease or effusion. No pneumothorax. Incompletely visualized proximal right humerus fracture. IMPRESSION: Mild cardiomegaly.  Negative for edema or  infiltrate. Electronically Signed   By: Jasmine Pang M.D.   On: 03/24/2020 23:56   DG Ribs Unilateral W/Chest Right  Result Date: 03/24/2020 CLINICAL DATA:  Right rib and shoulder pain EXAM: RIGHT RIBS AND CHEST - 3+ VIEW COMPARISON:  07/04/2019 FINDINGS: Single-view chest demonstrates an acute mildly displaced fracture involving the right humerus neck. Mild cardiomegaly. No focal consolidation or pleural effusion. No pneumothorax. Right rib series demonstrates no acute displaced right rib fracture IMPRESSION: 1. Negative for right displaced rib fracture 2. Acute mildly displaced fracture involving proximal right humerus Electronically Signed   By: Jasmine Pang M.D.   On: 03/24/2020 19:12   DG Shoulder Right  Result Date: 03/24/2020 CLINICAL DATA:  Right shoulder pain EXAM: RIGHT SHOULDER - 2+ VIEW COMPARISON:  None. FINDINGS: AC joint is intact. Acute fracture involving right humeral neck and greater tuberosity with about 1/4 shaft diameter anterior displacement of distal fracture fragment. Mild valgus angulation of distal fracture fragment. The humeral head projects over the glenoid. IMPRESSION: Acute mildly displaced and angulated fracture involving the right humeral neck and greater tuberosity. Electronically Signed   By: Jasmine Pang M.D.   On: 03/24/2020 19:13   DG Knee 2 Views Right  Result Date: 03/24/2020 CLINICAL DATA:  Pain EXAM: RIGHT KNEE - 1-2 VIEW COMPARISON:  None. FINDINGS: No evidence of fracture, dislocation, or joint effusion. No evidence of  arthropathy or other focal bone abnormality. Soft tissues are unremarkable. IMPRESSION: Negative. Electronically Signed   By: Katherine Mantlehristopher  Green M.D.   On: 03/24/2020 20:47   CT SHOULDER RIGHT WO CONTRAST  Result Date: 03/26/2020 CLINICAL DATA:  Right shoulder fracture EXAM: CT OF THE UPPER RIGHT EXTREMITY WITHOUT CONTRAST TECHNIQUE: Multidetector CT imaging of the upper right extremity was performed according to the standard protocol.  COMPARISON:  Plain radiographs 03/24/2020 FINDINGS: Bones/Joint/Cartilage There is an acute fracture of the a right humeral head. Fracture planes are seen involving the surgical neck of the humerus with approximately 2 cortical widths medial displacement of the a shaft as well as the greater tuberosity which appears mildly comminuted, but in grossly anatomic alignment. Glenohumeral articulation is preserved. The visualized scapula and clavicle are intact. Tiny focus of calcification is seen adjacent to the coracoid process likely reflecting the sequela of remote trauma or inflammation involving the short head biceps insertion. Visualized right ribs appear intact. Ligaments Suboptimally assessed by CT. Muscles and Tendons Unremarkable Soft tissues Small right shoulder effusion.  Right lower lobe atelectasis noted. IMPRESSION: Mildly displaced, anatomically aligned three-part fracture of the right humeral head with fracture planes involving the greater tuberosity, which appears mildly comminuted, and surgical neck of the humerus. Electronically Signed   By: Helyn NumbersAshesh  Parikh MD   On: 03/26/2020 05:11   ECHOCARDIOGRAM COMPLETE  Result Date: 03/25/2020    ECHOCARDIOGRAM REPORT   Patient Name:   Caffie DammeAREK Spackman Date of Exam: 03/25/2020 Medical Rec #:  [id number]   Height:       69.0 in Accession #:    [id number]  Weight:       194.0 lb Date of Birth:  02/19/1966    BSA:          2.039 m Patient Age:    54 years    BP:           124/81 mmHg Patient Gender: M           HR:           101 bpm. Exam Location:  Inpatient Procedure: 2D Echo, Cardiac Doppler and Color Doppler Indications:    Syncope 780.2 / R55  History:        Patient has prior history of Echocardiogram examinations, most                 recent 07/06/2019. Acute MI and CAD; Risk Factors:Hypertension,                 Dyslipidemia and Current Smoker.  Sonographer:    Renella CunasJulia Swaim RDCS Referring Phys: [id number] Deno LungerGEORGE J Orange Asc LtdHALHOUB IMPRESSIONS  1. Left ventricular ejection  fraction, by estimation, is 45 to 50%. The left ventricle has mildly decreased function. The left ventricle demonstrates global hypokinesis. Left ventricular diastolic parameters are consistent with Grade I diastolic dysfunction (impaired relaxation).  2. Right ventricular systolic function is normal. The right ventricular size is normal. Tricuspid regurgitation signal is inadequate for assessing PA pressure.  3. The mitral valve is normal in structure. No evidence of mitral valve regurgitation. No evidence of mitral stenosis.  4. The aortic valve is normal in structure. Aortic valve regurgitation is not visualized. No aortic stenosis is present.  5. The inferior vena cava is normal in size with greater than 50% respiratory variability, suggesting right atrial pressure of 3 mmHg. FINDINGS  Left Ventricle: Left ventricular ejection fraction, by estimation, is 45 to 50%. The left ventricle has mildly decreased function. The left  ventricle demonstrates global hypokinesis. The left ventricular internal cavity size was normal in size. There is  no left ventricular hypertrophy. Left ventricular diastolic parameters are consistent with Grade I diastolic dysfunction (impaired relaxation). Normal left ventricular filling pressure. Right Ventricle: The right ventricular size is normal. No increase in right ventricular wall thickness. Right ventricular systolic function is normal. Tricuspid regurgitation signal is inadequate for assessing PA pressure. Left Atrium: Left atrial size was normal in size. Right Atrium: Right atrial size was normal in size. Pericardium: There is no evidence of pericardial effusion. Mitral Valve: The mitral valve is normal in structure. Mild mitral annular calcification. No evidence of mitral valve regurgitation. No evidence of mitral valve stenosis. Tricuspid Valve: The tricuspid valve is normal in structure. Tricuspid valve regurgitation is trivial. No evidence of tricuspid stenosis. Aortic Valve:  The aortic valve is normal in structure. Aortic valve regurgitation is not visualized. No aortic stenosis is present. Pulmonic Valve: The pulmonic valve was normal in structure. Pulmonic valve regurgitation is not visualized. No evidence of pulmonic stenosis. Aorta: The aortic root is normal in size and structure. Venous: The inferior vena cava is normal in size with greater than 50% respiratory variability, suggesting right atrial pressure of 3 mmHg. IAS/Shunts: No atrial level shunt detected by color flow Doppler.  LEFT VENTRICLE PLAX 2D LVOT diam:     2.20 cm      Diastology LV SV:         51           LV e' medial:    4.47 cm/s LV SV Index:   25           LV E/e' medial:  17.2 LVOT Area:     3.80 cm     LV e' lateral:   9.37 cm/s                             LV E/e' lateral: 8.2  LV Volumes (MOD) LV vol d, MOD A2C: 159.0 ml LV vol d, MOD A4C: 159.0 ml LV vol s, MOD A2C: 82.4 ml LV vol s, MOD A4C: 105.0 ml LV SV MOD A2C:     76.6 ml LV SV MOD A4C:     159.0 ml LV SV MOD BP:      67.1 ml RIGHT VENTRICLE RV S prime:     13.10 cm/s TAPSE (M-mode): 2.0 cm LEFT ATRIUM             Index       RIGHT ATRIUM           Index LA Vol (A2C):   53.3 ml 26.13 ml/m RA Area:     17.70 cm LA Vol (A4C):   56.6 ml 27.75 ml/m RA Volume:   41.70 ml  20.45 ml/m LA Biplane Vol: 56.6 ml 27.75 ml/m  AORTIC VALVE LVOT Vmax:   79.80 cm/s LVOT Vmean:  55.500 cm/s LVOT VTI:    0.134 m  AORTA Ao Asc diam: 3.40 cm MITRAL VALVE MV Area (PHT): 7.99 cm     SHUNTS MV Decel Time: 95 msec      Systemic VTI:  0.13 m MV E velocity: 77.10 cm/s   Systemic Diam: 2.20 cm MV A velocity: 101.00 cm/s MV E/A ratio:  0.76 Armanda Magic MD Electronically signed by Armanda Magic MD Signature Date/Time: 03/25/2020/12:34:17 PM    Final    VAS [place] LOWER EXT ART SEG MULTI (SEGMENTALS &  LE RAYNAUDS)  Result Date: 03/04/2020 LOWER EXTREMITY DOPPLER STUDY Indications: Patient reports having pain that starts from the mid/lower back              (kidney) area that  radiates down both legs, right worse than left.              He experiences this pain at rest, standing and walking. He also              reports numbness across the toes on the right foot. He does not              express claudication symptoms or rest pain. High Risk Factors: Hypertension, hyperlipidemia, Diabetes, current smoker,                    coronary artery disease.  Comparison Study: NA Performing Technologist: Tyna JakschKeisha Barrett RVT  Examination Guidelines: A complete evaluation includes at minimum, Doppler waveform signals and systolic blood pressure reading at the level of bilateral brachial, anterior tibial, and posterior tibial arteries, when vessel segments are accessible. Bilateral testing is considered an integral part of a complete examination. Photoelectric Plethysmograph (PPG) waveforms and toe systolic pressure readings are included as required and additional duplex testing as needed. Limited examinations for reoccurring indications may be performed as noted.  ABI Findings: +---------+------------------+-----+----------------+--------+ Right    Rt Pressure (mmHg)IndexWaveform        Comment  +---------+------------------+-----+----------------+--------+ Brachial 140                                             +---------+------------------+-----+----------------+--------+ CFA                             triphasic                +---------+------------------+-----+----------------+--------+ Popliteal                       triphasic                +---------+------------------+-----+----------------+--------+ ATA      131               0.94 triphasic                +---------+------------------+-----+----------------+--------+ PTA      147               1.05 triphasic                +---------+------------------+-----+----------------+--------+ PERO     139               0.99 barely triphasic          +---------+------------------+-----+----------------+--------+ Great Toe139               0.99 Normal                   +---------+------------------+-----+----------------+--------+ +---------+------------------+-----+----------------+-------+ Left     Lt Pressure (mmHg)IndexWaveform        Comment +---------+------------------+-----+----------------+-------+ Brachial 138                                            +---------+------------------+-----+----------------+-------+ CFA  triphasic               +---------+------------------+-----+----------------+-------+ Popliteal                       triphasic               +---------+------------------+-----+----------------+-------+ ATA      129               0.92 triphasic               +---------+------------------+-----+----------------+-------+ PTA      146               1.04 triphasic               +---------+------------------+-----+----------------+-------+ PERO     141               1.01 barely triphasic        +---------+------------------+-----+----------------+-------+ Great Toe113               0.81                         +---------+------------------+-----+----------------+-------+ +-------+-----------+-----------+------------+------------+ ABI/TBIToday's ABIToday's TBIPrevious ABIPrevious TBI +-------+-----------+-----------+------------+------------+ Right  1.05       .99                                 +-------+-----------+-----------+------------+------------+ Left   1.04       .81                                 +-------+-----------+-----------+------------+------------+   Summary: Right: Resting right ankle-brachial index is within normal range. No evidence of significant right lower extremity arterial disease. The right toe-brachial index is normal. Left: Resting left ankle-brachial index is within normal range. No evidence of significant left lower  extremity arterial disease. The left toe-brachial index is normal.  *See table(s) above for measurements and observations.  Electronically signed by Julien Nordmann MD on 03/04/2020 at 9:05:41 PM.    Final     Microbiology: Recent Results (from the past 240 hour(s))  SARS CORONAVIRUS 2 (TAT 6-24 HRS) Nasopharyngeal Nasopharyngeal Swab     Status: None   Collection Time: 03/24/20  8:13 PM   Specimen: Nasopharyngeal Swab  Result Value Ref Range Status   SARS Coronavirus 2 NEGATIVE NEGATIVE Final    Comment: (NOTE) SARS-CoV-2 target nucleic acids are NOT DETECTED.  The SARS-CoV-2 RNA is generally detectable in upper and lower respiratory specimens during the acute phase of infection. Negative results do not preclude SARS-CoV-2 infection, do not rule out co-infections with other pathogens, and should not be used as the sole basis for treatment or other patient management decisions. Negative results must be combined with clinical observations, patient history, and epidemiological information. The expected result is Negative.  Fact Sheet for Patients: HairSlick.no  Fact Sheet for Healthcare Providers: quierodirigir.com  This test is not yet approved or cleared by the [place] FDA and  has been authorized for detection and/or diagnosis of SARS-CoV-2 by FDA under an Emergency Use Authorization (EUA). This EUA will remain  in effect (meaning this test can be used) for the duration of the COVID-19 declaration under Se ction 564(b)(1) of the Act, 21 U.S.C. section 360bbb-3(b)(1), unless the authorization is terminated or revoked sooner.  Performed at [facility], [address] [place], [place] [address]      Labs: Basic Metabolic Panel: Recent Labs  Lab 03/24/20 1747 03/25/20 0014 03/25/20 0056  NA 138  --  139  K 4.2  --  4.0  CL 102  --  103  CO2 24  --  23  GLUCOSE 112*  --  132*  BUN 18  --  16   CREATININE 0.93  --  0.85  CALCIUM 9.4  --  9.6  MG  --  1.8  --    Liver Function Tests: Recent Labs  Lab 03/24/20 1747 03/25/20 0056  AST 68* 47*  ALT 83* 77*  ALKPHOS 45 49  BILITOT 0.6 0.6  PROT 6.9 7.1  ALBUMIN 3.9 4.0   No results for input(s): LIPASE, AMYLASE in the last 168 hours. No results for input(s): AMMONIA in the last 168 hours. CBC: Recent Labs  Lab 03/24/20 1747 03/25/20 0241  WBC 18.5* 13.9*  NEUTROABS 13.7* 9.3*  HGB 13.7 13.1  HCT 41.7 42.1  MCV 77.4* 78.8*  PLT 331 299   Cardiac Enzymes: No results for input(s): CKTOTAL, CKMB, CKMBINDEX, TROPONINI in the last 168 hours. BNP: BNP (last 3 results) Recent Labs    07/04/19 1750 03/25/20 0014  BNP 267.6* 67.8    ProBNP (last 3 results) No results for input(s): PROBNP in the last 8760 hours.  CBG: Recent Labs  Lab 03/25/20 1136 03/25/20 1552 03/25/20 2155 03/26/20 0619 03/26/20 1121  GLUCAP 149* 153* 191* 147* 192*       Signed:  Demir Titsworth  [facility] Hospitalists 03/26/2020, 4:01 PM

## 2020-03-27 ENCOUNTER — Telehealth: Payer: Self-pay

## 2020-03-27 NOTE — Telephone Encounter (Signed)
Transition Care Management Follow-up Telephone Call Date of discharge and from where:[facility] on 03/28/2019 How have you been since you were released from the hospital?Called pt Larry Lewis answered (RFM, Patient signed DPR authorizing all [facility] to verbally disclose any medical information to [facility])  Pt not with him at this time. Larry Lewis stated he Still in pain did not pick up meds and  will pick up today Any questions or concerns? No questions/concerns reported.  Items Reviewed: Did the pt receive and understand the discharge instructions provided? Stated that have the instructions and have no questions.  Medications obtained and verified? said that has the medication list  and the hospital staff reviewed them in detail prior to discharge. He said he will pick up meds today  Any new allergies since your discharge? None reported  Do you have support at home? Yes, fiend living with him Friend stated he is in the middle of getting back his son from ED and does not have time to talk longer   Follow up appointments reviewed:    PCP Hospital f/u appt confirmed?Gwinda Passe on 04/10/2019 @ 1050 .   Specialist Hospital f/u appt confirmed? None scheduled at this time   Instructed friend If their condition worsens, is t aware to call  their PCP or go to the ED? Yes.Made aware if condition worsen or start experiencing rapid weight gain, chest pain, diff breathing, SOB, high fevers, or bleading to refer imediately to ED for further evaluation.  The Friend was provided with contact information for the PCP's office or ED and encouraged to call back with questions or concerns.

## 2020-03-31 ENCOUNTER — Other Ambulatory Visit: Payer: Self-pay | Admitting: Cardiology

## 2020-03-31 ENCOUNTER — Other Ambulatory Visit: Payer: Self-pay

## 2020-03-31 ENCOUNTER — Ambulatory Visit (INDEPENDENT_AMBULATORY_CARE_PROVIDER_SITE_OTHER): Payer: Self-pay | Admitting: Pharmacist

## 2020-03-31 ENCOUNTER — Encounter: Payer: Self-pay | Admitting: *Deleted

## 2020-03-31 ENCOUNTER — Encounter: Payer: Self-pay | Admitting: Pharmacist

## 2020-03-31 ENCOUNTER — Telehealth: Payer: Self-pay | Admitting: *Deleted

## 2020-03-31 VITALS — BP 130/86 | HR 96 | Wt 189.4 lb

## 2020-03-31 DIAGNOSIS — Z794 Long term (current) use of insulin: Secondary | ICD-10-CM

## 2020-03-31 DIAGNOSIS — Z760 Encounter for issue of repeat prescription: Secondary | ICD-10-CM

## 2020-03-31 DIAGNOSIS — I5042 Chronic combined systolic (congestive) and diastolic (congestive) heart failure: Secondary | ICD-10-CM

## 2020-03-31 DIAGNOSIS — E1165 Type 2 diabetes mellitus with hyperglycemia: Secondary | ICD-10-CM

## 2020-03-31 MED ORDER — METOPROLOL SUCCINATE ER 100 MG PO TB24: 100.0000 mg | ORAL_TABLET | Freq: Every day | ORAL | 0 refills | Status: DC

## 2020-03-31 MED ORDER — METFORMIN HCL 1000 MG PO TABS: 1000.0000 mg | ORAL_TABLET | Freq: Two times a day (BID) | ORAL | 1 refills | Status: DC

## 2020-03-31 MED FILL — SPIRONOLACTONE 25 MG TABLET: 25 | 90 days supply | Qty: 45 | Fill #1

## 2020-03-31 MED FILL — SERTRALINE HCL 25 MG TABLET: 25 | 90 days supply | Qty: 90 | Fill #1

## 2020-03-31 MED FILL — METOPROLOL SUCCINATE ER 100: 100 | 90 days supply | Qty: 90 | Fill #0

## 2020-03-31 MED FILL — LANTUS SOLOSTAR 100 UNITS/M: 100 | 30 days supply | Qty: 15 | Fill #0

## 2020-03-31 MED FILL — FUROSEMIDE 20 MG TABS: 20 | 90 days supply | Qty: 90 | Fill #1

## 2020-03-31 MED FILL — ATORVASTATIN CALCIUM 80 MG: 80 | 90 days supply | Qty: 90 | Fill #1

## 2020-03-31 MED FILL — CLOPIDOGREL 75 MG TABLET: 75 | 90 days supply | Qty: 90 | Fill #1

## 2020-03-31 MED FILL — LOSARTAN POTASSIUM 25 MG TA: 25 | 90 days supply | Qty: 90 | Fill #1

## 2020-03-31 MED FILL — METFORMIN HCL 1000 MG TABS: 1000 | 90 days supply | Qty: 180 | Fill #0

## 2020-03-31 NOTE — Progress Notes (Signed)
Patient ID: Larry Lewis                 DOB: 1965/11/08                      MRN: [id number]     HPI: Larry Lewis is a 55 y.o. male referred by Dr. Bjorn Pippin to HTN clinic. PMH is significant for severe CAD, hx of MI, DM, CHF, asthma, tobacco use, chest pain, and shortness of breath.  On 03/24/20, was admitted to hospital with lightheadedness and fall.  Fractured humerus .  Furosemide was discontinued.  At last visit, patient complained of increasing cheast pain, left arm pain, and SOB.  Due to patient CAD, reccommended patient go to ER for evaluation but patient refused.    Patient presents today for next CHF follow up.  Is not wearing the sling on his broken arm because he says it does not fit.  Has not scheduled appointment with orthopedist yet.  Was prescribed Percocet but has not taken frequently because he says it will make him sleep 20 hours a day. Rates his pain level a 7/10.     Patient denies and chest pain or SOB.  Has not been checking his blood pressure at home or his blood sugar, despite being on long acting and short acting insulin.  Patient's main goal is to travel back to [place] to be with his family where he thinks he will have a better support system.  Requests refills on all medications.  Current HTN  meds: losartan 25mg  daily, metoprolol 50mg  daily, spironolactone 12.5mg  daily.  Furosemide 20mg . (Note: Furosemide recently discontinued in hospital, patient did not know and has continued to take)  Previously tried: Entresto 24/26 BP goal: <130/80  Wt Readings from Last 3 Encounters:  03/26/20 189 lb 2.5 oz (85.8 kg)  03/17/20 192 lb 3.2 oz (87.2 kg)  03/09/20 193 lb 9.6 oz (87.8 kg)   BP Readings from Last 3 Encounters:  03/26/20 115/67  03/17/20 128/66  03/09/20 138/90   Pulse Readings from Last 3 Encounters:  03/26/20 94  03/17/20 99  03/09/20 98    Renal function: Estimated Creatinine Clearance: 105.3 mL/min (by C-G formula based on SCr of 0.85 mg/dL).  Past  Medical History:  Diagnosis Date  . AMI (acute myocardial infarction) (HCC)   . Anxiety   . Asthma   . Coronary artery disease   . Fall at home, initial encounter 03/24/2020  . High cholesterol   . Hypertension     Current Outpatient Medications on File Prior to Visit  Medication Sig Dispense Refill  . aspirin EC 81 MG tablet Take 81 mg by mouth daily. Swallow whole.    03/19/20 atorvastatin (LIPITOR) 80 MG tablet Take 1 tablet (80 mg total) by mouth daily. 30 tablet 5  . clopidogrel (PLAVIX) 75 MG tablet Take 1 tablet (75 mg total) by mouth daily. 30 tablet 5  . insulin aspart (NOVOLOG) 100 UNIT/ML FlexPen Inject 8 Units into the skin 3 (three) times daily with meals. Defer refills to PCP 15 mL 0  . insulin glargine (LANTUS) 100 UNIT/ML Solostar Pen Inject 25 Units into the skin 2 (two) times daily. Defer refills to PCP 15 mL 6  . losartan (COZAAR) 25 MG tablet Take 1 tablet (25 mg total) by mouth daily. 30 tablet 5  . metFORMIN (GLUCOPHAGE) 1000 MG tablet Take 1 tablet (1,000 mg total) by mouth 2 (two) times daily with a meal. Defer refills to  PCP 180 tablet 1  . metoprolol succinate (TOPROL-XL) 50 MG 24 hr tablet Take 1 tablet (50 mg total) by mouth daily. 30 tablet 11  . nitroGLYCERIN (NITROSTAT) 0.4 MG SL tablet Place 1 tablet (0.4 mg total) under the tongue every 5 (five) minutes x 3 doses as needed for chest pain. 25 tablet 3  . oxyCODONE-acetaminophen (PERCOCET/ROXICET) 5-325 MG tablet Take 1 tablet by mouth every 4 (four) hours as needed for up to 7 days for moderate pain. 30 tablet 0  . sertraline (ZOLOFT) 25 MG tablet Take 1 tablet (25 mg total) by mouth daily. 90 tablet 1  . spironolactone (ALDACTONE) 25 MG tablet Take 0.5 tablets (12.5 mg total) by mouth daily. 15 tablet 11   No current facility-administered medications on file prior to visit.    Allergies  Allergen Reactions  . Penicillins Itching     Assessment/Plan:  1. CHF - Patient BP in room today 130/86 which is  slightly above goal of <130/80.  Pulse elevated today as well, although this is normal for him.  Pain may be playing a factor as well. Encouraged patient to make appt with orthopedic physician to decide on next steps for broken right arm.    Concern regarding patient not checking blood sugar especially since he is now on rapid acting insulin.  Explained to patient the difference between Lantus and Novolog and importance of checking blood sugar and eating to prevent hypoglycemia.  Patient voiced understanding.  Requested refills on all medications.  However, all pill bottles had current refills except for metformin.  Instructed patient to contact his pharmacy but will send metformin so he is not without.    Due to patient's heart rate, able to continue to titrate up metoprolol succinate at this time.  Will increase to 100mg  once daily (this was the dose patient was previously at before he went to ).    Will continue all other CHF meds  Continue losartan 25 mg daily Continue furosemide 20mg  daily Continue spironolactone 12.5mg  once daily Stop metoprolol 50mg  once daily Start metoprolol succinate 100mg  once daily  [place], PharmD, BCACP, CDCES, CPP [facility] [address] [place], [place] [facility] Phone: [phone]; Fax: [phone] 03/31/2020 4:50 PM

## 2020-03-31 NOTE — Telephone Encounter (Signed)
Call made using [employer] # [phone]. Patient was not at home but [name] (listed on DPR), took message and provided shipping information. Patient will be enrolled for [employer] to ship a 21 day cardiac event monitor to [address], [place], [place]  [address].  Will request Arabic instructions from [employer], however, Mr. [name] does speak English. Letter with instructions will be mailed to patients [address]

## 2020-03-31 NOTE — Patient Instructions (Addendum)
It was good seeing you again!  We would like your blood pressure to be less than 130/80  Please continue your losartan 25mg  daily, furosemide 20 mg daily, and spironolactone 12.5mg  daily.  We will increase your metoprolol from 50mg  to 100mg   Please check with your pharmacy for all other refills  For dry eyes, I suggest your check with the pharmacy for Visine dry eye relief drops  , PharmD, BCACP, CDCES, CPP [facility] [address] [place], [facility] [address] Phone: [phone]; Fax: [phone] 03/31/2020 4:13 PM

## 2020-04-01 ENCOUNTER — Telehealth: Payer: Self-pay | Admitting: Licensed Clinical Social Worker

## 2020-04-01 NOTE — Telephone Encounter (Signed)
CSW f/u with PharmD Larry Lewis since pt had an appointment yesterday. Pt was recommended to f/u with Dr. [name] team after d/c and pt was still experiencing discomfort during appointment yesterday. Larry Lewis feels it is important for him to have that f/u.   CSW reached out to [place] [phone]), pt friend who helps coordinate care. [name] shares that pt has an appointment with Dr. [name] this Friday and that although pt is frustrated with everything happening health wise and has mentioned wanting to return to [place] that  [name] is continuing to provide support to pt. He and pt have my number for any additional support needed.   Larry Lewis, MSW, LCSW [facility] Navigation  [phone]

## 2020-04-03 MED FILL — !NOVOLOG FLEXPEN SYRINGE 1: 100/ML | 25 days supply | Qty: 6 | Fill #1

## 2020-04-08 ENCOUNTER — Telehealth: Payer: Self-pay | Admitting: Licensed Clinical Social Worker

## 2020-04-08 NOTE — Telephone Encounter (Signed)
CSW was given paperwork from [place], [place], that appears to have errantly had been sent to Dr. Bjorn Pippin. Paperwork from [facility] DSS, noting that pt needs to complete and return by 1/22. I called and spoke with pt friend/rommate Larry Lewis who shared that they also received similar paperwork which they have completed and returned.   To clarify I have called [facility] DSS pt caseworker Alla Feeling at [phone]. No answer, message left and requested a call back.   Larry Lewis, MSW, LCSW [facility] Care Navigation  [phone]

## 2020-04-09 ENCOUNTER — Encounter (INDEPENDENT_AMBULATORY_CARE_PROVIDER_SITE_OTHER): Payer: Self-pay

## 2020-04-09 ENCOUNTER — Ambulatory Visit (INDEPENDENT_AMBULATORY_CARE_PROVIDER_SITE_OTHER): Payer: Self-pay | Admitting: Physician Assistant

## 2020-04-09 ENCOUNTER — Other Ambulatory Visit: Payer: Self-pay | Admitting: Physician Assistant

## 2020-04-09 ENCOUNTER — Other Ambulatory Visit: Payer: Self-pay

## 2020-04-09 VITALS — BP 144/77 | HR 64 | Temp 98.7°F | Resp 18 | Ht 69.0 in

## 2020-04-09 DIAGNOSIS — R0989 Other specified symptoms and signs involving the circulatory and respiratory systems: Secondary | ICD-10-CM

## 2020-04-09 DIAGNOSIS — K5903 Drug induced constipation: Secondary | ICD-10-CM | POA: Insufficient documentation

## 2020-04-09 DIAGNOSIS — F5104 Psychophysiologic insomnia: Secondary | ICD-10-CM

## 2020-04-09 DIAGNOSIS — F411 Generalized anxiety disorder: Secondary | ICD-10-CM

## 2020-04-09 DIAGNOSIS — S42291S Other displaced fracture of upper end of right humerus, sequela: Secondary | ICD-10-CM

## 2020-04-09 MED ORDER — GUAIFENESIN ER 600 MG PO TB12
600.0000 mg | ORAL_TABLET | Freq: Two times a day (BID) | ORAL | 0 refills | Status: DC | PRN
Start: 2020-04-09 — End: 2020-04-09

## 2020-04-09 MED ORDER — POLYETHYLENE GLYCOL 3350 17 GM/SCOOP PO POWD: 17.0000 g | Freq: Two times a day (BID) | ORAL | 1 refills | Status: DC | PRN

## 2020-04-09 MED ORDER — SERTRALINE HCL 50 MG PO TABS: 50.0000 mg | ORAL_TABLET | Freq: Every day | ORAL | 2 refills | Status: DC

## 2020-04-09 MED ORDER — HYDROXYZINE HCL 50 MG PO TABS: ORAL_TABLET | ORAL | 0 refills | Status: DC

## 2020-04-09 MED FILL — hydrOXYzine HCL 50 MG TABS: 50 | 30 days supply | Qty: 30 | Fill #0

## 2020-04-09 NOTE — Patient Instructions (Addendum)
For your depression and anxiety, we are going to increase your Zoloft to 50 mg once daily.  To help you with sleep, I have sent hydroxyzine to your pharmacy, you will use 1/2-1 full tablet before bed.  For your constipation, I encourage you to increase your water intake to at least 5-6 bottles a day, I sent MiraLAX to your pharmacy to help you as well.  Please contact orthopedic office to let them know you are unable to pick up the pain medication so they can send that to a different pharmacy.  For your chest congestion, I sent Mucinex to the pharmacy, warm showers can also be very helpful.  Please let us know if there is anything else we can do for you  Roney Jaffe, PA-C Physician Assistant [facility] [email or web address]    How to Prevent Constipation After Surgery Constipation is a common problem after surgery. Many things can make constipation more likely after a surgery, including:  Certain medicines, especially numbing medicines (anesthetics) and very strong pain medicines called opioids.  Feeling stressed because of the surgery.  Eating different foods than normal.  Being less active. Symptoms of constipation include:  Having fewer than three bowel movements a week.  Straining to have a bowel movement.  Having hard, dry, or larger-than-normal stools (feces).  Discomfort in the lower abdomen, such as cramps or bloating.  Not feeling relief after having a bowel movement.  Nausea and vomiting. You can take steps to help prevent constipation after surgery. Follow these instructions at home: Eating and drinking  Eat foods that have a lot of fiber in them, such as beans, bran, whole grains, and fresh fruits and vegetables.  Limit foods that are high in fat and processed sugars, such as fried or sweet foods. These include french fries, hamburgers, cookies, and candy.  Take a fiber supplement as told by your  health care provider. If you are not taking a fiber supplement and you think you are not getting enough fiber from foods, talk to your health care provider about adding a fiber supplement to your diet.  Drink enough fluid to keep your urine pale yellow.  Drink clear fluids, especially water. Avoid drinking alcohol, caffeine, and soda. These can make constipation worse.   Activity  After surgery, return to your normal activities slowly, or when your health care provider says it is okay.  Start walking as soon as you can. Try to go a little farther each day.  Once your health care provider approves, do some sort of regular exercise. This helps prevent constipation.   Bowel movements  Go to the restroom when you have the urge to go. Do not hold it in.  Try drinking something hot to get a bowel movement started.  Keep track of how often you use the restroom. Medicines  Take over-the-counter and prescription medicines only as told by your health care provider.  Talk to your health care provider about medicines that may help prevent constipation, particularly if you have a history of constipation. Your health care provider may suggest a stool softener, laxative, or fiber supplement.  Do not take any medicines without talking to your health care provider first. Contact a health care provider if:  You used stool softeners or laxatives and still have not had a bowel movement within 24-48 hours after using them.  You have not had a bowel movement in 3 days.  You have a fever. Get help right away if you have:  Constipation that lasts for more than 4 days or if your symptoms get worse.  Bright red blood in your stool.  Pain in the abdomen or rectum.  Very bad cramping.  Thin, pencil-like stools.  Unexplained weight loss. Summary  Constipation is a common problem after surgery. Many things can make constipation more likely after a surgery, including certain medicines, eating  different foods than normal, and being less active.  Symptoms of constipation include having fewer than three bowel movements a week, straining to have a bowel movement, and cramps or bloating in the lower abdomen.  To help prevent constipation, you should eat foods that are high in fiber, drink plenty of fluids, and get regular physical activity.  Your health care provider may suggest medicines, such as stool softeners or laxatives, to help prevent constipation. This information is not intended to replace advice given to you by your health care provider. Make sure you discuss any questions you have with your health care provider. Document Revised: 01/23/2019 Document Reviewed: 01/23/2019 Elsevier Patient Education  2021 [employer].

## 2020-04-09 NOTE — Progress Notes (Signed)
Patient presents for 6 week FU for anxiety and depression. Patient takes medication around 12/1pm daily. Patient has had coffee with cream and sugar this morning. Patient denies pain at this time.

## 2020-04-09 NOTE — Progress Notes (Signed)
Established Patient Office Visit  Subjective:  Patient ID: Larry Lewis, male    DOB: 01/03/1966  Age: 55 y.o. MRN: [id number]  CC:  Chief Complaint  Patient presents with  . Medication Refill    HPI Larry Dammearek Jolley presents for medication mgmt. reports that he was hospitalized March 24, 2020 after suffering a fall   Admit date: 03/24/2020 Discharge date: 03/26/2020  Time spent: 45 minutes  Recommendations for Outpatient Follow-up:  Patient will be discharged to home.  Patient will need to follow up with primary care provider within one week of discharge.  Follow up with Dr. Renaye Rakersim Murphy, orthopedics in one week. Follow up with cardiology. Patient should continue medications as prescribed.  Patient should follow a heart healthy/carb modified diet.   Discharge Diagnoses:  Near syncope with fall Closed fracture of right proximal humerus Diabetes mellitus, type II uncontrolled with hyperglycemia Coronary disease Chronic combined systolic and diastolic heart failure Nicotine dependence  Discharge Condition: stable  Diet recommendation: heart healthy/carb modified    History of present illness:  HPI on 03/24/2020 by Dr. Shauna HughGeorge Shalhoub 55 year old male with past medical history of hypertension, asthma, coronary artery disease(cath with stent x 2 in 2007, cath 06/2019 with diffuse CAD - med mgmt),insulin-dependent diabetes mellitus type 2, systolic and diastolic congestive heart failure(Echo 06/2019 EF 35-40%),nicotine dependence who presents to [facility] emergency department status post fall complaining of right shoulder pain.  Patient explains that in the past several weeks he has been experiencing significant lightheadedness that he describes as "dizziness", primarily upon rising from a seated position. Symptoms sound like the began sometime in December. It turns out that patient was not taking any medications for nearly 6 months in 2021 after going to [place] to care for  his ill parents (who have now unfortunately expired in 11/2019).He ran out of medications while there and went several months without medications before returning back to [place] and being restarted on his medication regimen in December. Per review of cardiology notes, patient was complaining of multiple symptoms including weight gain, dyspnea on exertion and exertional chest pain.  At this time, patient states that his exertional chest pain and exertional dyspnea have improved. However, patient is now experiencing significant lightheadedness since resuming his medications.  At approximately 3 PM on 1/4, the patient was at his friend's restaurant walking down the stairs from the second floor to the first he suddenly had an episode of lightheadedness, similar to what is mentioned above.While he did not completely lose consciousness, he fell down the stairs and suddenly began to experience severe right shoulder pain. Pain was 10 out of 10 in intensity, sharp in quality, nonradiating and worse with any attempt to move the right upper extremity. EMS was contacted and the patient was brought into [facility] emergency department for evaluation.  Upon evaluation at [facility] emergency department, x-ray of the right upper extremity revealed a proximal right humeral neck fracture with some displacement and angulation. Emergency department divider discussed the case with Dr. Eulah PontMurphy with orthopedic surgery who stated that they will consult on the patient in the morning. Patient has been fitted with a arm immobilizer while in the emergency department. Patient's case is also been reviewed with Dr. Royann Shiversroitoru withcardiology.According the emergency department provider, cardiology feels there is no significant change in the patient's EKG today compared to prior and that cardiology can be formally consulted in the morning if felt indicated by the primary service. Patient received  several  doses of intravenous morphine for pain control. The hospitalist group was then called to assess patient for admission the hospital.  Hospital Course:  Near syncope with fall -Patient has been experiencing frequent episodes of lightheadedness upon standing from a seated position which has been occurring for the past several weeks -Suspect secondary to patient's cardiac medications given that he was off of them for several months during his trip to [place] -Echocardiogram EF of 45 to 50%, grade 1 diastolic dysfunction -Orthostatic vitals unremarkable -Cardiology consulted and appreciated- will likely need event monitor- to be set up as an outpatient  Closed fracture of right proximal humerus -Secondary to fall -Orthopedics consulted and appreciated- patient to follow up with Dr. Renaye Rakers -Continue pain control -Arm currently in sling  Diabetes mellitus, type II uncontrolled with hyperglycemia -Hemoglobin A1c7.5 -Metformin held- continue on discharge -Continue Lantus, insulin sliding scale and CBG monitoring  Coronary disease -No complaints of chest pain at this time -Patient with known extensive multivessel coronary disease -Cardiology consulted and appreciated -Continue aspirin, Plavix, statin  Chronic combined systolic and diastolic heart failure -BNP appears to be normal -Patient appears to be euvolemic and compensated -Echocardiogram obtained for new syncope- results as above -diuretics held on admission -Monitor intake and output, daily weights -Discussed with cardiology, Dr. Tenny Craw, recommended continuing losartan, toprol, spironolactone 12.5mg    Nicotine dependence -Smoking cessation discussed  Procedures: Echocardiogram  Consultations: Orthopedic surgery Cardiology   Reports today that he has had follow-up with orthopedics, has follow-up with cardiology on Monday, April 13, 2020 and has appointment with endocrinology May 13, 2020.  Reports that he has not noticed much difference since starting the Zoloft, states that he has continued to have difficulty sleeping, states that he is sleeping approximately 3 hours a night. Reports that he wakes several times throughout the night and has difficulty falling back asleep. Endorses racing thoughts.  Reports that he has been having constipation since starting the pain medication, states his last bowel movement was approximately 4 days ago. Endorses rectal discomfort, denies rectal bleeding. Reports he is drinking approximately 2 bottles of water a day, has not tried anything else for relief.  Reports that he has been having chest congestion for the past week, denies any other upper respiratory, states he finds it difficult to cough the phlegm up. Has not tried anything for relief.  Due to language barrier, an interpreter was present during the history-taking and subsequent discussion (and for part of the physical exam) with this patient.   Past Medical History:  Diagnosis Date  . AMI (acute myocardial infarction) (HCC)   . Anxiety   . Asthma   . Coronary artery disease   . Fall at home, initial encounter 03/24/2020  . High cholesterol   . Hypertension     Past Surgical History:  Procedure Laterality Date  . CARDIAC CATHETERIZATION    . RIGHT/LEFT HEART CATH AND CORONARY ANGIOGRAPHY N/A 07/09/2019   Procedure: RIGHT/LEFT HEART CATH AND CORONARY ANGIOGRAPHY;  Surgeon: Lennette Bihari, MD;  Location: [facility] INVASIVE CV LAB;  Service: Cardiovascular;  Laterality: N/A;    Family History  Problem Relation Age of Onset  . Other Neg Hx     Social History   Socioeconomic History  . Marital status: Single    Spouse name: Not on file  . Number of children: Not on file  . Years of education: Not on file  . Highest education level: Not on file  Occupational History  . Not on file  Tobacco Use  . Smoking status: Current Every Day Smoker    Packs/day: 1.00    Years:  0.50    Pack years: 0.50    Types: Cigarettes  . Smokeless tobacco: Never Used  Vaping Use  . Vaping Use: Never used  Substance and Sexual Activity  . Alcohol use: Never  . Drug use: Never  . Sexual activity: Not on file  Other Topics Concern  . Not on file  Social History Narrative  . Not on file   Social Determinants of Health   Financial Resource Strain: High Risk  . Difficulty of Paying Living Expenses: Hard  Food Insecurity: No Food Insecurity  . Worried About Programme researcher, broadcasting/film/videounning Out of Food in the Last Year: Never true  . Ran Out of Food in the Last Year: Never true  Transportation Needs: No Transportation Needs  . Lack of Transportation (Medical): No  . Lack of Transportation (Non-Medical): No  Physical Activity: Not on file  Stress: Not on file  Social Connections: Not on file  Intimate Partner Violence: Not on file    Outpatient Medications Prior to Visit  Medication Sig Dispense Refill  . aspirin EC 81 MG tablet Take 81 mg by mouth daily. Swallow whole.    Marland Kitchen. atorvastatin (LIPITOR) 80 MG tablet Take 1 tablet (80 mg total) by mouth daily. 30 tablet 5  . clopidogrel (PLAVIX) 75 MG tablet Take 1 tablet (75 mg total) by mouth daily. 30 tablet 5  . insulin aspart (NOVOLOG) 100 UNIT/ML FlexPen Inject 8 Units into the skin 3 (three) times daily with meals. Defer refills to PCP 15 mL 0  . insulin glargine (LANTUS) 100 UNIT/ML Solostar Pen Inject 25 Units into the skin 2 (two) times daily. Defer refills to PCP 15 mL 6  . losartan (COZAAR) 25 MG tablet Take 1 tablet (25 mg total) by mouth daily. 30 tablet 5  . metFORMIN (GLUCOPHAGE) 1000 MG tablet Take 1 tablet (1,000 mg total) by mouth 2 (two) times daily with a meal. Defer refills to PCP 180 tablet 1  . metoprolol succinate (TOPROL XL) 100 MG 24 hr tablet Take 1 tablet (100 mg total) by mouth daily. Take with or immediately following a meal. 180 tablet 0  . nitroGLYCERIN (NITROSTAT) 0.4 MG SL tablet Place 1 tablet (0.4 mg total) under  the tongue every 5 (five) minutes x 3 doses as needed for chest pain. 25 tablet 3  . spironolactone (ALDACTONE) 25 MG tablet Take 0.5 tablets (12.5 mg total) by mouth daily. 15 tablet 11  . sertraline (ZOLOFT) 25 MG tablet Take 1 tablet (25 mg total) by mouth daily. 90 tablet 1   No facility-administered medications prior to visit.    Allergies  Allergen Reactions  . Penicillins Itching    ROS Review of Systems  Constitutional: Positive for fatigue. Negative for chills and fever.  HENT: Positive for congestion. Negative for ear pain, sinus pressure, sinus pain and sore throat.   Eyes: Negative.   Respiratory: Negative for cough, shortness of breath and wheezing.   Cardiovascular: Negative for chest pain.  Gastrointestinal: Positive for constipation. Negative for abdominal pain, anal bleeding, blood in stool, nausea, rectal pain and vomiting.  Endocrine: Negative.   Genitourinary: Negative.   Musculoskeletal: Positive for arthralgias.  Skin: Negative.   Allergic/Immunologic: Negative.   Neurological: Negative for dizziness.  Hematological: Negative.   Psychiatric/Behavioral: Positive for dysphoric mood and sleep disturbance. Negative for self-injury. The patient is nervous/anxious.       Objective:  Physical Exam Constitutional:      General: He is not in acute distress.    Appearance: Normal appearance. He is not ill-appearing.  HENT:     Head: Normocephalic and atraumatic.     Right Ear: External ear normal.     Left Ear: External ear normal.     Nose: Nose normal.     Mouth/Throat:     Mouth: Mucous membranes are moist.     Pharynx: Oropharynx is clear.  Eyes:     Extraocular Movements: Extraocular movements intact.     Conjunctiva/sclera: Conjunctivae normal.     Pupils: Pupils are equal, round, and reactive to light.  Cardiovascular:     Rate and Rhythm: Normal rate and regular rhythm.     Pulses: Normal pulses.     Heart sounds: Normal heart sounds.   Pulmonary:     Effort: Pulmonary effort is normal.     Breath sounds: Normal breath sounds. No wheezing.  Abdominal:     General: Abdomen is flat.     Palpations: Abdomen is soft.  Musculoskeletal:     Cervical back: Normal range of motion.     Comments: Right arm in sling  Skin:    General: Skin is warm.  Neurological:     General: No focal deficit present.     Mental Status: He is alert. Mental status is at baseline. He is disoriented.  Psychiatric:        Mood and Affect: Mood normal.        Behavior: Behavior normal.        Thought Content: Thought content normal.        Judgment: Judgment normal.     BP (!) 144/77 (BP Location: Left Arm, Patient Position: Sitting, Cuff Size: Normal)   Pulse 64   Temp 98.7 F (37.1 C) (Oral)   Resp 18   Ht 5\' 9"  (1.753 m)   SpO2 100%   BMI 27.97 kg/m  Wt Readings from Last 3 Encounters:  03/31/20 189 lb 6.4 oz (85.9 kg)  03/26/20 189 lb 2.5 oz (85.8 kg)  03/17/20 192 lb 3.2 oz (87.2 kg)     Health Maintenance Due  Topic Date Due  . Hepatitis C Screening  Never done  . PNEUMOCOCCAL POLYSACCHARIDE VACCINE AGE 38-64 HIGH RISK  Never done  . COVID-19 Vaccine (1) Never done  . FOOT EXAM  Never done  . OPHTHALMOLOGY EXAM  Never done  . COLONOSCOPY (Pts 45-37yrs Insurance coverage will need to be confirmed)  Never done    There are no preventive care reminders to display for this patient.  Lab Results  Component Value Date   TSH 1.070 02/24/2020   Lab Results  Component Value Date   WBC 13.9 (H) 03/25/2020   HGB 13.1 03/25/2020   HCT 42.1 03/25/2020   MCV 78.8 (L) 03/25/2020   PLT 299 03/25/2020   Lab Results  Component Value Date   NA 139 03/25/2020   K 4.0 03/25/2020   CO2 23 03/25/2020   GLUCOSE 132 (H) 03/25/2020   BUN 16 03/25/2020   CREATININE 0.85 03/25/2020   BILITOT 0.6 03/25/2020   ALKPHOS 49 03/25/2020   AST 47 (H) 03/25/2020   ALT 77 (H) 03/25/2020   PROT 7.1 03/25/2020   ALBUMIN 4.0 03/25/2020    CALCIUM 9.6 03/25/2020   ANIONGAP 13 03/25/2020   Lab Results  Component Value Date   CHOL 280 (H) 02/24/2020   Lab Results  Component Value Date  HDL 33 (L) 02/24/2020   Lab Results  Component Value Date   LDLCALC 190 (H) 02/24/2020   Lab Results  Component Value Date   TRIG 287 (H) 02/24/2020   Lab Results  Component Value Date   CHOLHDL 8.5 (H) 02/24/2020   Lab Results  Component Value Date   HGBA1C 7.5 (H) 03/25/2020      Assessment & Plan:   Problem List Items Addressed This Visit      Digestive   Drug-induced constipation   Relevant Medications   polyethylene glycol powder (GLYCOLAX/MIRALAX) 17 GM/SCOOP powder     Musculoskeletal and Integument   Closed fracture of right proximal humerus     Other   Psychophysiological insomnia   Relevant Medications   hydrOXYzine (ATARAX/VISTARIL) 50 MG tablet   Generalized anxiety disorder - Primary   Relevant Medications   sertraline (ZOLOFT) 50 MG tablet   hydrOXYzine (ATARAX/VISTARIL) 50 MG tablet    Other Visit Diagnoses    Chest congestion       Relevant Medications   guaiFENesin (MUCINEX) 600 MG 12 hr tablet    1. Generalized anxiety disorder Increase Zoloft 50 mg - sertraline (ZOLOFT) 50 MG tablet; Take 1 tablet (50 mg total) by mouth daily.  Dispense: 30 tablet; Refill: 2  2. Drug-induced constipation Increase water intake, trial MiraLAX, patient education given- polyethylene glycol powder (GLYCOLAX/MIRALAX) 17 GM/SCOOP powder; Take 17 g by mouth 2 (two) times daily as needed.  Dispense: 3350 g; Refill: 1  3. Psychophysiological insomnia Trial hydroxyzine 25 to 50 mg at bedrime - hydrOXYzine (ATARAX/VISTARIL) 50 MG tablet; Take 1/2- 1 full tab QHS PRN for insomnia  Dispense: 30 tablet; Refill: 0  4. Chest congestion Trial Mucinex - guaiFENesin (MUCINEX) 600 MG 12 hr tablet; Take 1 tablet (600 mg total) by mouth 2 (two) times daily as needed for to loosen phlegm.  Dispense: 60 tablet; Refill:  0   I have reviewed the patient's medical history (PMH, PSH, Social History, Family History, Medications, and allergies) , and have been updated if relevant. I spent 30 minutes reviewing chart and  face to face time with patient.    Meds ordered this encounter  Medications  . sertraline (ZOLOFT) 50 MG tablet    Sig: Take 1 tablet (50 mg total) by mouth daily.    Dispense:  30 tablet    Refill:  2    Dose change    Order Specific Question:   Supervising Provider    Answer:   Shan Levans E [[id number]]  . guaiFENesin (MUCINEX) 600 MG 12 hr tablet    Sig: Take 1 tablet (600 mg total) by mouth 2 (two) times daily as needed for to loosen phlegm.    Dispense:  60 tablet    Refill:  0    Order Specific Question:   Supervising Provider    Answer:   Delford Field, PATRICK E [[id number]]  . hydrOXYzine (ATARAX/VISTARIL) 50 MG tablet    Sig: Take 1/2- 1 full tab QHS PRN for insomnia    Dispense:  30 tablet    Refill:  0    Order Specific Question:   Supervising Provider    Answer:   Delford Field, PATRICK E [[id number]]  . polyethylene glycol powder (GLYCOLAX/MIRALAX) 17 GM/SCOOP powder    Sig: Take 17 g by mouth 2 (two) times daily as needed.    Dispense:  3350 g    Refill:  1    Order Specific Question:   Supervising Provider  Answer:   Storm Frisk [[id number]]    Follow-up: Return in about 4 weeks (around 05/07/2020).    Kasandra Knudsen Ofilia Rayon, PA-C

## 2020-04-11 NOTE — Progress Notes (Signed)
Cardiology Office Note:    Date:  04/13/2020   ID:  Larry Lewis, DOB 10/06/1965, MRN [id number]  PCP:  Grayce SessionsEdwards, Michelle P, NP  Cardiologist:  Little Ishikawahristopher L Maxamillian Tienda, MD  Electrophysiologist:  None   Referring MD: Grayce SessionsEdwards, Michelle P, NP   Chief Complaint  Patient presents with  . Congestive Heart Failure    History of Present Illness:    Larry Lewis is a 55 y.o. male with a hx of severe multivessel CAD, combined systolic and diastolic heart failure, diabetes, hypertension, hyperlipidemia, asthma, tobacco use who presents as a hospital follow-up.  He was [facility] to [facility] on 07/[facility]/2019 with acute combined systolic and diastolic heart failure.  He reported a history of CAD, had 2 stents placed in [place].  He had not followed with a cardiologist since that time.  He presented with volume overload, and was treated with IV Lasix.  TTE on 07/06/2019 showed LVEF 35 to 40%, normal RV function, possible bicuspid aortic valve (no AI or AS).  He underwent LHC/RHC on 4/20, which showed severe diffuse multivessel CAD, medical management recommended.  Right heart pressures were normal.  In addition he was diagnosed with type 2 diabetes, A1c 12.2%.  Also found to have LDL 211.  He was lost to follow-up from April through December 2021 as return to [place] as both of his parents were ill.  He was off all his medications for several months.  [facility] 1/4 through 03/26/2020 after presenting with near syncope and fall leading to humerus fracture.  He was walking down the stairs at his friend's restaurant and had an episode of lightheadedness causing him to fall down the stairs and suffer right humeral neck fracture.  Orthostatics were negative.  Echocardiogram on 03/25/2020 showed EF 45 to 50%, grade 1 diastolic dysfunction, normal RV function, no significant valvular disease.  He declined surgery and follow-up with orthopedics was arranged.  Since discharge from the hospital, he reports  that he has been doing okay.  Denies any further dizziness or syncope.  Denies any chest pain, dyspnea, lower extremity edema, or palpitations.  Continues to smoke, about 7 cigarettes/day.  States that on follow-up with orthopedic surgery, surgery was not recommended.    Wt Readings from Last 3 Encounters:  04/13/20 191 lb (86.6 kg)  03/31/20 189 lb 6.4 oz (85.9 kg)  03/26/20 189 lb 2.5 oz (85.8 kg)     Past Medical History:  Diagnosis Date  . AMI (acute myocardial infarction) (HCC)   . Anxiety   . Asthma   . Coronary artery disease   . Fall at home, initial encounter 03/24/2020  . High cholesterol   . Hypertension     Past Surgical History:  Procedure Laterality Date  . CARDIAC CATHETERIZATION    . RIGHT/LEFT HEART CATH AND CORONARY ANGIOGRAPHY N/A 07/09/2019   Procedure: RIGHT/LEFT HEART CATH AND CORONARY ANGIOGRAPHY;  Surgeon: Lennette BihariKelly, Thomas A, MD;  Location: [facility] INVASIVE CV LAB;  Service: Cardiovascular;  Laterality: N/A;    Current Medications: Current Meds  Medication Sig  . aspirin EC 81 MG tablet Take 81 mg by mouth daily. Swallow whole.  . clopidogrel (PLAVIX) 75 MG tablet Take 1 tablet (75 mg total) by mouth daily.  Marland Kitchen. guaiFENesin (MUCINEX) 600 MG 12 hr tablet Take 1 tablet (600 mg total) by mouth 2 (two) times daily as needed for to loosen phlegm.  . hydrOXYzine (ATARAX/VISTARIL) 50 MG tablet Take 1/2- 1 full tab QHS PRN for  insomnia  . losartan (COZAAR) 25 MG tablet Take 1 tablet (25 mg total) by mouth daily.  . metFORMIN (GLUCOPHAGE) 1000 MG tablet Take 1 tablet (1,000 mg total) by mouth 2 (two) times daily with a meal. Defer refills to PCP  . metoprolol succinate (TOPROL XL) 100 MG 24 hr tablet Take 1 tablet (100 mg total) by mouth daily. Take with or immediately following a meal.  . polyethylene glycol powder (GLYCOLAX/MIRALAX) 17 GM/SCOOP powder Take 17 g by mouth 2 (two) times daily as needed.  . sertraline (ZOLOFT) 50 MG tablet Take 1 tablet (50 mg total) by  mouth daily.  Marland Kitchen spironolactone (ALDACTONE) 25 MG tablet Take 0.5 tablets (12.5 mg total) by mouth daily.     Allergies:   Penicillins   Social History   Socioeconomic History  . Marital status: Single    Spouse name: Not on file  . Number of children: Not on file  . Years of education: Not on file  . Highest education level: Not on file  Occupational History  . Not on file  Tobacco Use  . Smoking status: Current Every Day Smoker    Packs/day: 1.00    Years: 0.50    Pack years: 0.50    Types: Cigarettes  . Smokeless tobacco: Never Used  Vaping Use  . Vaping Use: Never used  Substance and Sexual Activity  . Alcohol use: Never  . Drug use: Never  . Sexual activity: Not on file  Other Topics Concern  . Not on file  Social History Narrative  . Not on file   Social Determinants of Health   Financial Resource Strain: High Risk  . Difficulty of Paying Living Expenses: Hard  Food Insecurity: No Food Insecurity  . Worried About Programme researcher, broadcasting/film/video in the Last Year: Never true  . Ran Out of Food in the Last Year: Never true  Transportation Needs: No Transportation Needs  . Lack of Transportation (Medical): No  . Lack of Transportation (Non-Medical): No  Physical Activity: Not on file  Stress: Not on file  Social Connections: Not on file     Family History: The patient's family history is negative for Other.  ROS:   Please see the history of present illness.     All other systems reviewed and are negative.  EKGs/Labs/Other Studies Reviewed:    The following studies were reviewed today:   EKG:  EKG is not ordered today.  The ekg ordered most recently demonstrates sinus rhythm, rate 96, less than 1 mm ST depressions in leads I, II, aVL, V5/6  TTE 07/06/19: 1. Left ventricular ejection fraction, by estimation, is 35 to 40%. The  left ventricle has moderately decreased function. The left ventricle  demonstrates regional wall motion abnormalities (see scoring   diagram/findings for description). Left ventricular  diastolic parameters are indeterminate.  2. Right ventricular systolic function is normal. The right ventricular  size is normal.  3. Left atrial size was moderately dilated.  4. The mitral valve is normal in structure. Mild mitral valve  regurgitation. No evidence of mitral stenosis.  5. Indeterminate cusp structure. Given age and amount of calcification,  high suspicion for bicuspid valve, either functional or congenital.. The  aortic valve has an indeterminant number of cusps. Aortic valve  regurgitation is not visualized. Mild to  moderate aortic valve sclerosis/calcification is present, without any  evidence of aortic stenosis.  6. The inferior vena cava is normal in size with greater than 50%  respiratory variability,  suggesting right atrial pressure of 3 mmHg.   RHC/LHC 07/09/19:  Mid RCA to Dist RCA lesion is 100% stenosed.  Acute Mrg lesion is 90% stenosed.  Ost RCA to Prox RCA lesion is 70% stenosed.  Prox RCA to Mid RCA lesion is 90% stenosed.  1st Mrg lesion is 85% stenosed.  2nd Mrg-1 lesion is 100% stenosed.  2nd Mrg-2 lesion is 100% stenosed.  3rd Mrg lesion is 90% stenosed.  Dist Cx lesion is 70% stenosed.  Dist LAD lesion is 100% stenosed.  2nd Diag lesion is 95% stenosed.  Mid LAD lesion is 50% stenosed.  1st Diag-2 lesion is 40% stenosed.  1st Diag-1 lesion is 80% stenosed.  Mid Cx to Dist Cx lesion is 30% stenosed.   Severe diffuse multivessel CAD with moderate irregularity of the LAD with 80% proximal diagonal stenosis prior to a previously placed stent with intimal hyperplasia within the stented segment, 50% the stenosis, 95% stenosis in the second diagonal vessel and total occlusion of the apical LAD; left circumflex vessel with large OM1 vessel with 85% stenosis proximal to an aneurysmal segment, total occlusion of the OM 2 vessel with previously placed stent in this vessel and 90 and  70% bifurcation stenoses in the distal circumflex; severely diffusely diseased RCA with distal occlusion.  There is faint filling of a probable small PLA vessel via the left injection.  Normal right heart pressures.  LVEDP 16 mmHg.  RECOMMENDATION: Plan initiate medical therapy since at present he is not on any anti-ischemic medications.  Most vessels are not amenable to intervention; however if patient experiences increasing chest pain symptomatology the large OM1 vessel can potentially undergo intervention with stenting.  Aggressive lipid-lowering therapy with target LDL less than 70.  Optimal blood pressure control.  Smoking cessation is essential.  With diffuse CAD consider DAPT therapy.  Recent Labs: 02/24/2020: TSH 1.070 03/25/2020: ALT 77; B Natriuretic Peptide 67.8; BUN 16; Creatinine, Ser 0.85; Hemoglobin 13.1; Magnesium 1.8; Platelets 299; Potassium 4.0; Sodium 139  Recent Lipid Panel    Component Value Date/Time   CHOL 280 (H) 02/24/2020 1221   TRIG 287 (H) 02/24/2020 1221   HDL 33 (L) 02/24/2020 1221   CHOLHDL 8.5 (H) 02/24/2020 1221   CHOLHDL 8.1 07/07/2019 0336   VLDL 29 07/07/2019 0336   LDLCALC 190 (H) 02/24/2020 1221    Physical Exam:    VS:  BP 128/88   Pulse 98   Ht 5\' 9"  (1.753 m)   Wt 191 lb (86.6 kg)   SpO2 97%   BMI 28.21 kg/m     Wt Readings from Last 3 Encounters:  04/13/20 191 lb (86.6 kg)  03/31/20 189 lb 6.4 oz (85.9 kg)  03/26/20 189 lb 2.5 oz (85.8 kg)     GEN: in no acute distress HEENT: Normal NECK: No JVD CARDIAC:RRR, no murmurs, rubs, gallops RESPIRATORY:  Clear to auscultation without rales, wheezing or rhonchi  ABDOMEN: Soft, non-tender, non-distended MUSCULOSKELETAL:  No edema.  R arm in sling SKIN: Warm and dry NEUROLOGIC:  Alert and oriented x 3 PSYCHIATRIC:  Normal affect   ASSESSMENT:    1. Chronic combined systolic and diastolic congestive heart failure (HCC)   2. Near syncope   3. Coronary artery disease involving  native coronary artery of native heart with angina pectoris (HCC)   4. Tobacco use   5. Hyperlipidemia, unspecified hyperlipidemia type    PLAN:    CAD: Severe multivessel CAD not amenable to intervention on catheterization 07/09/2019.  Denies  any anginal symptoms -Continue aspirin 81 mg daily and Plavix 75 mg -Continue Toprol-XL 25 mg daily -Sublingual nitroglycerin as needed  Chronic combined systolic and diastolic heart failure: EF 35 to 40% on TTE 07/06/2019.  Echocardiogram on 03/25/2020 showed EF 45 to 50%, grade 1 diastolic dysfunction, normal RV function, no significant valvular disease.   -Continue Toprol-XL 100 mg daily -Continue losartan 25 mg daily -Continue spironolactone 12.5 mg daily -Check BMP  Lightheadedness/possible syncope: Recent episode of lightheadedness/possible syncope resulting in fall down stairs and found to have fractured humerus.  30-day cardiac monitor ordered on discharge from hospital.  Will follow up results.  He was not orthostatic on presentation.  Will provide patient with BP monitor and advised to check BP if having any further lightheadedness.  Hypoglycemia also on differential, advised to monitor glucose  Hyperlipidemia: LDL 190 on 02/24/2020 but had been off of statin.  Restarted atorvastatin 80 mg daily  Type 2 diabetes: A1c 12.2 07/05/2019, improved to 7.5 on 03/25/2020.  Continue insulin, follows with PCP  Tobacco use: Patient counseled on risks of tobacco use and cessation strongly encouraged.  He is going to try using nicotine replacement gum to help him quit  Lower extremity pain: Normal ABIs 03/04/2020  R humerus fracture: OK to hold plavix and proceed with surgery if recommended by orthopedic surgery, though patient reports surgery not recommended at this time    RTC in 2 months   Medication Adjustments/Labs and Tests Ordered: Current medicines are reviewed at length with the patient today.  Concerns regarding medicines are outlined above.   Orders Placed This Encounter  Procedures  . Basic metabolic panel   No orders of the defined types were placed in this encounter.   Patient Instructions  Medication Instructions:  Your physician recommends that you continue on your current medications as directed. Please refer to the Current Medication list given to you today.  *If you need a refill on your cardiac medications before your next appointment, please call your pharmacy*   Lab Work: BMET today  If you have labs (blood work) drawn today and your tests are completely normal, you will receive your results only by: Marland Kitchen MyChart Message (if you have MyChart) OR . A paper copy in the mail If you have any lab test that is abnormal or we need to change your treatment, we will call you to review the results.   Follow-Up: At [facility], you and your health needs are our priority.  As part of our continuing mission to provide you with exceptional heart care, we have created designated Provider Care Teams.  These Care Teams include your primary Cardiologist (physician) and Advanced Practice Providers (APPs -  Physician Assistants and Nurse Practitioners) who all work together to provide you with the care you need, when you need it.  We recommend signing up for the patient portal called "MyChart".  Sign up information is provided on this After Visit Summary.  MyChart is used to connect with patients for Virtual Visits (Telemedicine).  Patients are able to view lab/test results, encounter notes, upcoming appointments, etc.  Non-urgent messages can be sent to your provider as well.   To learn more about what you can do with MyChart, go to [email or web address].    Your next appointment:   2 month(s)  The format for your next appointment:   In Person  Provider:   Epifanio Lesches, MD   Please check your blood pressure at home daily, write it down.  Call the  office or send message via Mychart with the readings in 2 weeks  for Dr. Bjorn PippinSchumann to review.       Signed, Little Ishikawahristopher L Tracye Szuch, MD  04/13/2020 5:47 PM    [facility]

## 2020-04-13 ENCOUNTER — Encounter: Payer: Self-pay | Admitting: Cardiology

## 2020-04-13 ENCOUNTER — Ambulatory Visit (INDEPENDENT_AMBULATORY_CARE_PROVIDER_SITE_OTHER): Payer: Self-pay | Admitting: Cardiology

## 2020-04-13 ENCOUNTER — Other Ambulatory Visit: Payer: Self-pay

## 2020-04-13 VITALS — BP 128/88 | HR 98 | Ht 69.0 in | Wt 191.0 lb

## 2020-04-13 DIAGNOSIS — I5042 Chronic combined systolic (congestive) and diastolic (congestive) heart failure: Secondary | ICD-10-CM

## 2020-04-13 DIAGNOSIS — E785 Hyperlipidemia, unspecified: Secondary | ICD-10-CM

## 2020-04-13 DIAGNOSIS — I25119 Atherosclerotic heart disease of native coronary artery with unspecified angina pectoris: Secondary | ICD-10-CM

## 2020-04-13 DIAGNOSIS — Z720 Tobacco use: Secondary | ICD-10-CM

## 2020-04-13 DIAGNOSIS — R55 Syncope and collapse: Secondary | ICD-10-CM

## 2020-04-13 NOTE — Patient Instructions (Addendum)
Medication Instructions:  Your physician recommends that you continue on your current medications as directed. Please refer to the Current Medication list given to you today.  *If you need a refill on your cardiac medications before your next appointment, please call your pharmacy*   Lab Work: BMET today  If you have labs (blood work) drawn today and your tests are completely normal, you will receive your results only by: Marland Kitchen MyChart Message (if you have MyChart) OR . A paper copy in the mail If you have any lab test that is abnormal or we need to change your treatment, we will call you to review the results.   Follow-Up: At [facility], you and your health needs are our priority.  As part of our continuing mission to provide you with exceptional heart care, we have created designated Provider Care Teams.  These Care Teams include your primary Cardiologist (physician) and Advanced Practice Providers (APPs -  Physician Assistants and Nurse Practitioners) who all work together to provide you with the care you need, when you need it.  We recommend signing up for the patient portal called "MyChart".  Sign up information is provided on this After Visit Summary.  MyChart is used to connect with patients for Virtual Visits (Telemedicine).  Patients are able to view lab/test results, encounter notes, upcoming appointments, etc.  Non-urgent messages can be sent to your provider as well.   To learn more about what you can do with MyChart, go to [email or web address].    Your next appointment:   2 month(s)  The format for your next appointment:   In Person  Provider:   Epifanio Lesches, MD   Please check your blood pressure at home daily, write it down.  Call the office or send message via Mychart with the readings in 2 weeks for Dr. Bjorn Pippin to review.

## 2020-04-14 LAB — BASIC METABOLIC PANEL
BUN/Creatinine Ratio: 16 (ref 9–20)
BUN: 14 mg/dL (ref 6–24)
CO2: 22 mmol/L (ref 20–29)
Calcium: 10.2 mg/dL (ref 8.7–10.2)
Chloride: 99 mmol/L (ref 96–106)
Creatinine, Ser: 0.88 mg/dL (ref 0.76–1.27)
GFR calc Af Amer: 111 mL/min/{1.73_m2} (ref >59)
GFR calc non Af Amer: 96 mL/min/{1.73_m2} (ref >59)
Glucose: 68 mg/dL (ref 65–99)
Potassium: 4.5 mmol/L (ref 3.5–5.2)
Sodium: 139 mmol/L (ref 134–144)

## 2020-04-21 ENCOUNTER — Ambulatory Visit (INDEPENDENT_AMBULATORY_CARE_PROVIDER_SITE_OTHER): Payer: Medicaid Other

## 2020-04-21 DIAGNOSIS — R55 Syncope and collapse: Secondary | ICD-10-CM

## 2020-05-01 ENCOUNTER — Telehealth: Payer: Self-pay

## 2020-05-01 NOTE — Telephone Encounter (Signed)
Called patient's number with Arabic interpreter to assess monitor event that was triggered at 2:33AM. Patient is not home, left message with friend Shirline Frees to have patient call back.

## 2020-05-04 NOTE — Telephone Encounter (Signed)
Spoke with Gwenyth Bouillon, ok per DPR  Phone was not working/lost signal, no issues at that time  Report per [employer] sinus rhythm

## 2020-05-11 ENCOUNTER — Other Ambulatory Visit: Payer: Self-pay

## 2020-05-12 ENCOUNTER — Telehealth: Payer: Self-pay | Admitting: *Deleted

## 2020-05-12 NOTE — Telephone Encounter (Signed)
Received critical monitor report from [employer]:  05/09/20 at 1:17:11 AM Sinus rhythm with 6 sec ventricular tachycardia w/ lead loss, auto trigger. Rate 164  05/09/20 at 1:17:24 AM-follow up strip Sinus rhythm HR 94  [employer] discussed with on call MD at 0235 on 2/19 (no note in epic).  Called to discuss-spoke to Mohamed/friend (ok per DPR)-patient was awake because he was having issues with the phone connecting to the monitor.   He has not had any near syncopal/syncopal episodes.  No palpitations, lightheadednes, dizziness, etc.     Routed to MD to make aware/review

## 2020-05-13 ENCOUNTER — Ambulatory Visit (INDEPENDENT_AMBULATORY_CARE_PROVIDER_SITE_OTHER): Payer: Medicaid Other | Admitting: Internal Medicine

## 2020-05-13 ENCOUNTER — Other Ambulatory Visit: Payer: Self-pay

## 2020-05-13 ENCOUNTER — Other Ambulatory Visit: Payer: Self-pay | Admitting: Internal Medicine

## 2020-05-13 ENCOUNTER — Encounter: Payer: Self-pay | Admitting: Internal Medicine

## 2020-05-13 VITALS — BP 136/80 | HR 86 | Ht 69.0 in | Wt 195.0 lb

## 2020-05-13 DIAGNOSIS — Z760 Encounter for issue of repeat prescription: Secondary | ICD-10-CM | POA: Diagnosis not present

## 2020-05-13 DIAGNOSIS — Z794 Long term (current) use of insulin: Secondary | ICD-10-CM | POA: Diagnosis not present

## 2020-05-13 DIAGNOSIS — E1165 Type 2 diabetes mellitus with hyperglycemia: Secondary | ICD-10-CM

## 2020-05-13 DIAGNOSIS — E785 Hyperlipidemia, unspecified: Secondary | ICD-10-CM | POA: Insufficient documentation

## 2020-05-13 DIAGNOSIS — E1159 Type 2 diabetes mellitus with other circulatory complications: Secondary | ICD-10-CM | POA: Diagnosis not present

## 2020-05-13 DIAGNOSIS — E119 Type 2 diabetes mellitus without complications: Secondary | ICD-10-CM | POA: Insufficient documentation

## 2020-05-13 LAB — POCT GLUCOSE (DEVICE FOR HOME USE): POC Glucose: 207 mg/dl — AB (ref 70–99)

## 2020-05-13 MED ORDER — METFORMIN HCL 1000 MG PO TABS: 1000.0000 mg | ORAL_TABLET | Freq: Every day | ORAL | 3 refills | Status: DC

## 2020-05-13 MED ORDER — ATORVASTATIN CALCIUM 80 MG PO TABS: 80.0000 mg | ORAL_TABLET | Freq: Every day | ORAL | 3 refills | Status: DC

## 2020-05-13 MED ORDER — DEXCOM G6 SENSOR MISC: 1.0000 | 11 refills | Status: DC

## 2020-05-13 MED ORDER — NOVOLOG FLEXPEN 100 UNIT/ML ~~LOC~~ SOPN: 10.0000 [IU] | PEN_INJECTOR | Freq: Three times a day (TID) | SUBCUTANEOUS | 3 refills | Status: DC

## 2020-05-13 MED ORDER — INSULIN GLARGINE 100 UNIT/ML SOLOSTAR PEN: 25.0000 [IU] | PEN_INJECTOR | Freq: Two times a day (BID) | SUBCUTANEOUS | 3 refills | Status: DC

## 2020-05-13 MED ORDER — TRULICITY 0.75 MG/0.5ML ~~LOC~~ SOAJ: 0.7500 mg | SUBCUTANEOUS | 6 refills | Status: DC

## 2020-05-13 MED ORDER — DEXCOM G6 TRANSMITTER MISC: 1.0000 | 3 refills | Status: DC

## 2020-05-13 MED ORDER — INSULIN PEN NEEDLE 32G X 4 MM MISC: 1.0000 | Freq: Four times a day (QID) | 3 refills | Status: DC

## 2020-05-13 MED FILL — TRUEPLUS PEN NDL 32GX5/32: 32G X 4 MM | 25 days supply | Qty: 100 | Fill #0

## 2020-05-13 MED FILL — NOVOLOG FLEXPEN SYRINGE: 100 | 50 days supply | Qty: 15 | Fill #0

## 2020-05-13 MED FILL — LANTUS SOLOSTAR 100 UNITS/M: 100 | 30 days supply | Qty: 15 | Fill #0

## 2020-05-13 NOTE — Patient Instructions (Signed)
-   Continue Metformin 1000 mg , 1 tablet daily - Continue Lantus 25 units Twice daily  - Increase Novolog 10 units with each meal  - Start Trulicity 0.75 mg weekly      HOW TO TREAT LOW BLOOD SUGARS (Blood sugar LESS THAN 70 MG/DL)  Please follow the RULE OF 15 for the treatment of hypoglycemia treatment (when your (blood sugars are less than 70 mg/dL)    STEP 1: Take 15 grams of carbohydrates when your blood sugar is low, which includes:   3-4 GLUCOSE TABS  OR  3-4 OZ OF JUICE OR REGULAR SODA OR  ONE TUBE OF GLUCOSE GEL     STEP 2: RECHECK blood sugar in 15 MINUTES STEP 3: If your blood sugar is still low at the 15 minute recheck --> then, go back to STEP 1 and treat AGAIN with another 15 grams of carbohydrates.

## 2020-05-13 NOTE — Progress Notes (Signed)
Name: Larry Lewis  MRN/ DOB: [id number], 08-15-1965   Age/ Sex: 55 y.o., male    PCP: Grayce Sessions, NP   Reason for Endocrinology Evaluation: Type 2 Diabetes Mellitus     Date of Initial Endocrinology Visit: 05/13/2020     PATIENT IDENTIFIER: [name] is a 55 y.o. male with a past medical history of T2DM, CAD, Asthma and Anxiety . The patient presented for initial endocrinology clinic visit on 05/13/2020 for consultative assistance with his diabetes management.    HPI: [name] is he is accompanied by his friend    Diagnosed with DM  In 2007 Prior Medications tried/Intolerance: glipzide  Currently checking blood sugars 2 x / day Hypoglycemia episodes : no              Hemoglobin A1c has ranged from 7.5% in 2022, peaking at 12.2% in 2021. Patient required assistance for hypoglycemia: no Patient has required hospitalization within the last 1 year from hyper or hypoglycemia:   In terms of diet, the patient 1 meal  , eats chocolate  And add some sugar to tea  Mother with heart disease   HOME DIABETES REGIMEN: Lantus 25 units BID  Novolog 8 units  Metformin 1000 mg 1 tablet daily    Statin: yes ACE-I/ARB: yes Prior Diabetic Education: no       DIABETIC COMPLICATIONS: Microvascular complications:    Denies: CKD, neuropathy, retinopathy  Last eye exam: Completed a while ago   Macrovascular complications:   CAD ( S/P PCI)  Denies:  PVD, CVA   PAST HISTORY: Past Medical History:  Past Medical History:  Diagnosis Date  . AMI (acute myocardial infarction) (HCC)   . Anxiety   . Asthma   . Coronary artery disease   . Fall at home, initial encounter 03/24/2020  . High cholesterol   . Hypertension    Past Surgical History:  Past Surgical History:  Procedure Laterality Date  . CARDIAC CATHETERIZATION    . RIGHT/LEFT HEART CATH AND CORONARY ANGIOGRAPHY N/A 07/09/2019   Procedure: RIGHT/LEFT HEART CATH AND CORONARY ANGIOGRAPHY;  Surgeon: Lennette Bihari, MD;  Location: [facility];  Service: Cardiovascular;  Laterality: N/A;      Social History:  reports that he has been smoking cigarettes. He has a 0.50 pack-year smoking history. He has never used smokeless tobacco. He reports that he does not drink alcohol and does not use drugs. Family History:  Family History  Problem Relation Age of Onset  . Other Neg Hx      HOME MEDICATIONS: Allergies as of 05/13/2020      Reactions   Penicillins Itching      Medication List       Accurate as of May 13, 2020 10:57 AM. If you have any questions, ask your nurse or doctor.        aspirin EC 81 MG tablet Take 81 mg by mouth daily. Swallow whole.   atorvastatin 80 MG tablet Commonly known as: LIPITOR Take 1 tablet (80 mg total) by mouth daily.   clopidogrel 75 MG tablet Commonly known as: PLAVIX Take 1 tablet (75 mg total) by mouth daily.   Dexcom G6 Sensor Misc 1 Device by Does not apply route as directed. Started by: Scarlette Shorts, MD   Dexcom G6 Transmitter Misc 1 Device by Does not apply route as directed. Started by: Scarlette Shorts, MD   hydrOXYzine 50 MG tablet Commonly known as: ATARAX/VISTARIL Take 1/2- 1 full  tab QHS PRN for insomnia   insulin glargine 100 UNIT/ML Solostar Pen Commonly known as: LANTUS Inject 25 Units into the skin 2 (two) times daily. Defer refills to PCP   Insulin Pen Needle 32G X 4 MM Misc 1 Device by Does not apply route in the morning, at noon, in the evening, and at bedtime. Started by: Scarlette Shorts, MD   losartan 25 MG tablet Commonly known as: COZAAR Take 1 tablet (25 mg total) by mouth daily.   metFORMIN 1000 MG tablet Commonly known as: GLUCOPHAGE Take 1 tablet (1,000 mg total) by mouth 2 (two) times daily with a meal. Defer refills to PCP What changed: when to take this   metoprolol succinate 100 MG 24 hr tablet Commonly known as: Toprol XL Take 1 tablet (100 mg total) by mouth daily.  Take with or immediately following a meal.   nitroGLYCERIN 0.4 MG SL tablet Commonly known as: NITROSTAT Place 1 tablet (0.4 mg total) under the tongue every 5 (five) minutes x 3 doses as needed for chest pain.   NovoLOG FlexPen 100 UNIT/ML FlexPen Generic drug: insulin aspart Inject 10 Units into the skin 3 (three) times daily with meals. What changed:   how much to take  additional instructions Changed by: Scarlette Shorts, MD   polyethylene glycol powder 17 GM/SCOOP powder Commonly known as: GLYCOLAX/MIRALAX Take 17 g by mouth 2 (two) times daily as needed.   sertraline 50 MG tablet Commonly known as: Zoloft Take 1 tablet (50 mg total) by mouth daily.   spironolactone 25 MG tablet Commonly known as: ALDACTONE Take 0.5 tablets (12.5 mg total) by mouth daily.   Trulicity 0.75 MG/0.5ML Sopn Generic drug: Dulaglutide Inject 0.75 mg into the skin once a week. Started by: Scarlette Shorts, MD        ALLERGIES: Allergies  Allergen Reactions  . Penicillins Itching     REVIEW OF SYSTEMS: A comprehensive ROS was conducted with the patient and is negative except as per HPI and below:  Review of Systems  Gastrointestinal: Negative for diarrhea and nausea.  Genitourinary: Positive for frequency and urgency.  Neurological: Positive for tingling.  Endo/Heme/Allergies: Positive for polydipsia.      OBJECTIVE:   VITAL SIGNS: BP 136/80   Pulse 86   Ht 5\' 9"  (1.753 m)   Wt 195 lb (88.5 kg)   SpO2 98%   BMI 28.80 kg/m    PHYSICAL EXAM:  General: Pt appears well and is in NAD  Neck: General: Supple without adenopathy or carotid bruits. Thyroid: Thyroid size normal.  No goiter or nodules appreciated.   Lungs: Clear with good BS bilat with no rales, rhonchi, or wheezes  Heart: RRR   Abdomen: Normoactive bowel sounds, soft, nontender, without masses or organomegaly palpable  Extremities:  Lower extremities - No pretibial edema. No lesions.  Skin: Normal  texture and temperature to palpation.   Neuro: MS is good with appropriate affect, pt is alert and Ox3    DATA REVIEWED:  Lab Results  Component Value Date   HGBA1C 7.5 (H) 03/25/2020   HGBA1C 8.8 (H) 02/24/2020   HGBA1C 12.2 (H) 07/05/2019   Lab Results  Component Value Date   LDLCALC 190 (H) 02/24/2020   CREATININE 0.88 04/13/2020   No results found for: [facility]  Lab Results  Component Value Date   CHOL 280 (H) 02/24/2020   HDL 33 (L) 02/24/2020   LDLCALC 190 (H) 02/24/2020   TRIG 287 (H) 02/24/2020  CHOLHDL 8.5 (H) 02/24/2020        ASSESSMENT / PLAN / RECOMMENDATIONS:   1) Type 2 Diabetes Mellitus, Sub-Optimally controlled, With macrovascular  complications - Most recent A1c of 7.5 %. Goal A1c < 7.0 %.   Plan: GENERAL: I have discussed with the patient the pathophysiology of diabetes. We went over the natural progression of the disease. We talked about both insulin resistance and insulin deficiency. We stressed the importance of lifestyle changes including diet and exercise. I explained the complications associated with diabetes including retinopathy, nephropathy, neuropathy as well as increased risk of cardiovascular disease. We went over the benefit seen with glycemic control.    I explained to the patient that diabetic patients are at higher than normal risk for amputations.  Discussed pharmacokinetics of basal/bolus insulin and the importance of taking prandial insulin with meals.   We also discussed avoiding sugar-sweetened beverages and snacks, when possible.   Discussed add on therapy with GLP1 agonists and SGLT-2 inhibitor but it seems he is having BPH symptoms, which he was advised to discussed PCP, once BPH symptoms improve , will consider starting SGLT-2 inhibitors. In the meantime will start Trulicity. Cautioned against GI side effects . No hx of pancreatitis in the past   A prescription of Dexcom has been sent   A referral to ophthalmology has been   Placed   MEDICATIONS: - Continue Metformin 1000 mg , 1 tablet daily - Continue Lantus 25 units Twice daily  - Increase Novolog 10 units with each meal  - Start Trulicity 0.75 mg weekly   EDUCATION / INSTRUCTIONS:  BG monitoring instructions: Patient is instructed to check his blood sugars 3 times a day, before meals .  Call [facility] if: BG persistently < 70  . I reviewed the Rule of 15 for the treatment of hypoglycemia in detail with the patient. Literature supplied.   2) Diabetic complications:   Eye: Does not have known diabetic retinopathy.   Neuro/ Feet: Does not have known diabetic peripheral neuropathy.  Renal: Patient does not have known baseline CKD. He is  on an ACEI/ARB at present.  3) Dyslipidemia:     - LDL as high as 211 in 06/2019. He wasn't sure if he is on it or not, he thinks he is .  - Emphasized the importance of compliance with statin intake  - Will refill    Medication Atorvastatin 80 mg daily      Signed electronically by: Lyndle Herrlich, MD  [facility] Endocrinology  [facility] [address] [place] [place], [place] [address] Phone: [phone] FAX: [phone]   CC: Grayce Sessions, NP [address] [place] [address] Phone: [phone]  Fax: [phone]    Return to Endocrinology clinic as below: Future Appointments  Date Time Provider Department Center  05/21/2020  3:10 PM Grayce Sessions, NP [facility] None  08/12/2020  8:10 AM Shamleffer, Konrad Dolores, MD [id number] None

## 2020-05-18 NOTE — Telephone Encounter (Signed)
I can't see the monitor results yet, is it still in process?

## 2020-05-18 NOTE — Telephone Encounter (Signed)
Strips reviewed by Dr. Les Pou changes at this time.

## 2020-05-21 ENCOUNTER — Ambulatory Visit (INDEPENDENT_AMBULATORY_CARE_PROVIDER_SITE_OTHER): Payer: Self-pay | Admitting: Primary Care

## 2020-05-28 ENCOUNTER — Other Ambulatory Visit: Payer: Self-pay

## 2020-05-28 ENCOUNTER — Ambulatory Visit (INDEPENDENT_AMBULATORY_CARE_PROVIDER_SITE_OTHER): Payer: Medicaid Other | Admitting: Primary Care

## 2020-05-28 VITALS — BP 124/86 | HR 86 | Temp 97.5°F | Ht 69.0 in | Wt 201.2 lb

## 2020-05-28 DIAGNOSIS — R350 Frequency of micturition: Secondary | ICD-10-CM

## 2020-05-28 DIAGNOSIS — R351 Nocturia: Secondary | ICD-10-CM | POA: Diagnosis not present

## 2020-05-28 DIAGNOSIS — N529 Male erectile dysfunction, unspecified: Secondary | ICD-10-CM

## 2020-05-28 DIAGNOSIS — R6882 Decreased libido: Secondary | ICD-10-CM | POA: Diagnosis not present

## 2020-05-28 LAB — POCT URINALYSIS DIP (CLINITEK)
Bilirubin, UA: NEGATIVE
Blood, UA: NEGATIVE
Glucose, UA: NEGATIVE mg/dL
Ketones, POC UA: NEGATIVE mg/dL
Leukocytes, UA: NEGATIVE
Nitrite, UA: NEGATIVE
POC PROTEIN,UA: NEGATIVE
Spec Grav, UA: 1.025 (ref 1.010–1.025)
Urobilinogen, UA: 0.2 E.U./dL
pH, UA: 6 (ref 5.0–8.0)

## 2020-05-28 NOTE — Progress Notes (Signed)
[facility]    NO:MVEHMCNOB urination and unable to empty bladder  SUBJECTIVE:  Larry Lewis is a 55 y.o. male who complains of urinary frequency, unable to empty bladder and nocturia  1 year.  Patient has a precipitating event excessive caffeine intake.( 4-5 cups of coffee a day, cholate craves )  Localizes the pain to the lower right  Abdomen  Especially after eating .  Has not  tried OTC medications. .  Denies fever, chills, nausea, vomiting, abdominal pain, flank painhematuria.    ROS: As in HPI.  All other pertinent ROS negative.     Past Medical History:  Diagnosis Date  . AMI (acute myocardial infarction) (HCC)   . Anxiety   . Asthma   . Coronary artery disease   . Fall at home, initial encounter 03/24/2020  . High cholesterol   . Hypertension    Past Surgical History:  Procedure Laterality Date  . CARDIAC CATHETERIZATION    . RIGHT/LEFT HEART CATH AND CORONARY ANGIOGRAPHY N/A 07/09/2019   Procedure: RIGHT/LEFT HEART CATH AND CORONARY ANGIOGRAPHY;  Surgeon: Lennette Bihari, MD;  Location: [facility];  Service: Cardiovascular;  Laterality: N/A;   Allergies  Allergen Reactions  . Penicillins Itching   Current Outpatient Medications on File Prior to Visit  Medication Sig Dispense Refill  . aspirin EC 81 MG tablet Take 81 mg by mouth daily. Swallow whole.    Marland Kitchen atorvastatin (LIPITOR) 80 MG tablet Take 1 tablet (80 mg total) by mouth daily. 90 tablet 3  . clopidogrel (PLAVIX) 75 MG tablet Take 1 tablet (75 mg total) by mouth daily. 30 tablet 5  . Continuous Blood Gluc Sensor (DEXCOM G6 SENSOR) MISC 1 Device by Does not apply route as directed. 3 each 11  . Continuous Blood Gluc Transmit (DEXCOM G6 TRANSMITTER) MISC 1 Device by Does not apply route as directed. 1 each 3  . Dulaglutide (TRULICITY) 0.75 MG/0.5ML SOPN Inject 0.75 mg into the skin once a week. 2 mL 6  . furosemide (LASIX) 20 MG tablet Take 20 mg by mouth daily.    . hydrOXYzine  (ATARAX/VISTARIL) 50 MG tablet Take 1/2- 1 full tab QHS PRN for insomnia 30 tablet 0  . insulin aspart (NOVOLOG FLEXPEN) 100 UNIT/ML FlexPen Inject 10 Units into the skin 3 (three) times daily with meals. 30 mL 3  . insulin glargine (LANTUS) 100 UNIT/ML Solostar Pen Inject 25 Units into the skin 2 (two) times daily. Defer refills to PCP 45 mL 3  . Insulin Pen Needle 32G X 4 MM MISC 1 Device by Does not apply route in the morning, at noon, in the evening, and at bedtime. 400 each 3  . metFORMIN (GLUCOPHAGE) 1000 MG tablet Take 1 tablet (1,000 mg total) by mouth daily with breakfast. Defer refills to PCP 90 tablet 3  . metoprolol succinate (TOPROL XL) 100 MG 24 hr tablet Take 1 tablet (100 mg total) by mouth daily. Take with or immediately following a meal. 180 tablet 0  . polyethylene glycol powder (GLYCOLAX/MIRALAX) 17 GM/SCOOP powder Take 17 g by mouth 2 (two) times daily as needed. 3350 g 1  . sertraline (ZOLOFT) 50 MG tablet Take 1 tablet (50 mg total) by mouth daily. 30 tablet 2  . spironolactone (ALDACTONE) 25 MG tablet Take 0.5 tablets (12.5 mg total) by mouth daily. 15 tablet 11  . losartan (COZAAR) 25 MG tablet Take 1 tablet (25 mg total) by mouth daily. 30 tablet 5  . nitroGLYCERIN (  NITROSTAT) 0.4 MG SL tablet Place 1 tablet (0.4 mg total) under the tongue every 5 (five) minutes x 3 doses as needed for chest pain. 25 tablet 3   No current facility-administered medications on file prior to visit.   Social History   Socioeconomic History  . Marital status: Single    Spouse name: Not on file  . Number of children: Not on file  . Years of education: Not on file  . Highest education level: Not on file  Occupational History  . Not on file  Tobacco Use  . Smoking status: Current Every Day Smoker    Packs/day: 1.00    Years: 0.50    Pack years: 0.50    Types: Cigarettes  . Smokeless tobacco: Never Used  Vaping Use  . Vaping Use: Never used  Substance and Sexual Activity  . Alcohol  use: Never  . Drug use: Never  . Sexual activity: Not on file  Other Topics Concern  . Not on file  Social History Narrative  . Not on file   Social Determinants of Health   Financial Resource Strain: High Risk  . Difficulty of Paying Living Expenses: Hard  Food Insecurity: No Food Insecurity  . Worried About Programme researcher, broadcasting/film/video in the Last Year: Never true  . Ran Out of Food in the Last Year: Never true  Transportation Needs: No Transportation Needs  . Lack of Transportation (Medical): No  . Lack of Transportation (Non-Medical): No  Physical Activity: Not on file  Stress: Not on file  Social Connections: Not on file  Intimate Partner Violence: Not on file   Family History  Problem Relation Age of Onset  . Other Neg Hx     OBJECTIVE:  Vitals:   05/28/20 1539  BP: 124/86  Pulse: 86  Temp: (!) 97.5 F (36.4 C)  TempSrc: Temporal  SpO2: 97%  Weight: 201 lb 3.2 oz (91.3 kg)  Height: 5\' 9"  (1.753 m)   General appearance: AOx3 in no acute distress HEENT: NCAT.   Lungs: clear to auscultation bilaterally without adventitious breath sounds Heart: regular rate and rhythm.  Radial pulses 2+ symmetrical bilaterally Abdomen: soft; distended; tenderness RLQ; bowel sounds present; no guarding or rebound tenderness Back: no CVA tenderness Extremities: no edema; symmetrical with no gross deformities Skin: warm and dry Neurologic: Ambulates from chair to exam table without difficulty Psychological: alert and cooperative; normal mood and affect    ASSESSMENT & PLAN: N[place] was seen today for follow-up, prostate check and urinary frequency.  Diagnoses and all orders for this visit:  Urinary frequency -     POCT URINALYSIS DIP (CLINITEK)- negative   Erectile dysfunction associated with vasculopathy Rule out other causative factors - labs order   Nocturia -     PSA  Low libido -     Testosterone   Maryln Gottron

## 2020-05-29 LAB — TESTOSTERONE: Testosterone: 609 ng/dL (ref 264–916)

## 2020-05-29 LAB — PSA: Prostate Specific Ag, Serum: 0.2 ng/mL (ref 0.0–4.0)

## 2020-06-04 ENCOUNTER — Encounter (INDEPENDENT_AMBULATORY_CARE_PROVIDER_SITE_OTHER): Payer: Self-pay | Admitting: Primary Care

## 2020-06-08 NOTE — Progress Notes (Signed)
Cardiology Office Note:    Date:  06/09/2020   ID:  Larry Dammearek Matthew, DOB 11/28/1965, MRN [id number]  PCP:  Grayce SessionsEdwards, Michelle P, NP  Cardiologist:  Little Ishikawahristopher L Maurya Nethery, MD  Electrophysiologist:  None   Referring MD: Grayce SessionsEdwards, Michelle P, NP   Chief Complaint  Patient presents with  . Congestive Heart Failure    History of Present Illness:    Larry Lewis is a 55 y.o. male with a hx of severe multivessel CAD, combined systolic and diastolic heart failure, diabetes, hypertension, hyperlipidemia, asthma, tobacco use who presents as a hospital follow-up.  He was admitted to [facility] on 07/[facility]/2019 with acute combined systolic and diastolic heart failure.  He reported a history of CAD, had 2 stents placed in 2007 in [place].  He had not followed with a cardiologist since that time.  He presented with volume overload, and was treated with IV Lasix.  TTE on 07/06/2019 showed LVEF 35 to 40%, normal RV function, possible bicuspid aortic valve (no AI or AS).  He underwent LHC/RHC on 4/20, which showed severe diffuse multivessel CAD, medical management recommended.  Right heart pressures were normal.  In addition he was diagnosed with type 2 diabetes, A1c 12.2%.  Also found to have LDL 211.  He was lost to follow-up from April through December 2021 as return to [place] as both of his parents were ill.  He was off all his medications for several months.  He was [facility] from 1/4 through 03/26/2020 after presenting with possible syncope and fall leading to humerus fracture.  He was walking down the stairs at his friend's restaurant and had an episode of lightheadedness and possible sudden loss of consciousness causing him to fall down the stairs and suffer right humeral neck fracture.  Orthostatics were negative.  Echocardiogram on 03/25/2020 showed EF 45 to 50%, grade 1 diastolic dysfunction, normal RV function, no significant valvular disease.  He declined surgery and follow-up with orthopedics was arranged.   Cardiac monitor on 05/22/20 showed NSVT x 6 seconds.  Since last clinic visit, he reports that he is doing okay.  Continues to have intermittent lightheadedness, particularly with standing or moving his head certain ways.  Denies any further syncopal episodes.  Does report he has had chest pain a few times per week, but short duration and has not had to take any nitroglycerin.  Does report he feels short of breath.  He denies any lower extremity edema.  Smokes about 10 cigarettes/day.  Denies any palpitations.  He has not been exercising.  Weight is up 13 pounds.   Wt Readings from Last 3 Encounters:  06/09/20 204 lb 3.2 oz (92.6 kg)  05/28/20 201 lb 3.2 oz (91.3 kg)  05/13/20 195 lb (88.5 kg)     Past Medical History:  Diagnosis Date  . AMI (acute myocardial infarction) (HCC)   . Anxiety   . Asthma   . Coronary artery disease   . Fall at home, initial encounter 03/24/2020  . High cholesterol   . Hypertension     Past Surgical History:  Procedure Laterality Date  . CARDIAC CATHETERIZATION    . RIGHT/LEFT HEART CATH AND CORONARY ANGIOGRAPHY N/A 07/09/2019   Procedure: RIGHT/LEFT HEART CATH AND CORONARY ANGIOGRAPHY;  Surgeon: Lennette BihariKelly, Thomas A, MD;  Location: [facility] INVASIVE CV LAB;  Service: Cardiovascular;  Laterality: N/A;    Current Medications: Current Meds  Medication Sig  . aspirin EC 81 MG tablet Take 81 mg by mouth daily. Swallow whole.  .Marland Kitchen  atorvastatin (LIPITOR) 80 MG tablet Take 1 tablet (80 mg total) by mouth daily.  . Continuous Blood Gluc Sensor (DEXCOM G6 SENSOR) MISC 1 Device by Does not apply route as directed.  . Continuous Blood Gluc Transmit (DEXCOM G6 TRANSMITTER) MISC 1 Device by Does not apply route as directed.  . hydrOXYzine (ATARAX/VISTARIL) 50 MG tablet Take 1/2- 1 full tab QHS PRN for insomnia  . insulin aspart (NOVOLOG FLEXPEN) 100 UNIT/ML FlexPen Inject 10 Units into the skin 3 (three) times daily with meals.  . insulin glargine (LANTUS) 100 UNIT/ML Solostar  Pen Inject 25 Units into the skin 2 (two) times daily. Defer refills to PCP  . Insulin Pen Needle 32G X 4 MM MISC 1 Device by Does not apply route in the morning, at noon, in the evening, and at bedtime.  . metFORMIN (GLUCOPHAGE) 1000 MG tablet Take 1 tablet (1,000 mg total) by mouth daily with breakfast. Defer refills to PCP  . polyethylene glycol powder (GLYCOLAX/MIRALAX) 17 GM/SCOOP powder Take 17 g by mouth 2 (two) times daily as needed.  . sertraline (ZOLOFT) 50 MG tablet Take 1 tablet (50 mg total) by mouth daily.  . [DISCONTINUED] clopidogrel (PLAVIX) 75 MG tablet Take 1 tablet (75 mg total) by mouth daily.  . [DISCONTINUED] furosemide (LASIX) 20 MG tablet Take 20 mg by mouth daily.  . [DISCONTINUED] metoprolol succinate (TOPROL XL) 100 MG 24 hr tablet Take 1 tablet (100 mg total) by mouth daily. Take with or immediately following a meal.  . [DISCONTINUED] spironolactone (ALDACTONE) 25 MG tablet Take 0.5 tablets (12.5 mg total) by mouth daily.     Allergies:   Penicillins   Social History   Socioeconomic History  . Marital status: Single    Spouse name: Not on file  . Number of children: Not on file  . Years of education: Not on file  . Highest education level: Not on file  Occupational History  . Not on file  Tobacco Use  . Smoking status: Current Every Day Smoker    Packs/day: 1.00    Years: 0.50    Pack years: 0.50    Types: Cigarettes  . Smokeless tobacco: Never Used  Vaping Use  . Vaping Use: Never used  Substance and Sexual Activity  . Alcohol use: Never  . Drug use: Never  . Sexual activity: Not on file  Other Topics Concern  . Not on file  Social History Narrative  . Not on file   Social Determinants of Health   Financial Resource Strain: High Risk  . Difficulty of Paying Living Expenses: Hard  Food Insecurity: No Food Insecurity  . Worried About Programme researcher, broadcasting/film/video in the Last Year: Never true  . Ran Out of Food in the Last Year: Never true   Transportation Needs: No Transportation Needs  . Lack of Transportation (Medical): No  . Lack of Transportation (Non-Medical): No  Physical Activity: Not on file  Stress: Not on file  Social Connections: Not on file     Family History: The patient's family history is negative for Other.  ROS:   Please see the history of present illness.     All other systems reviewed and are negative.  EKGs/Labs/Other Studies Reviewed:    The following studies were reviewed today:   EKG:  EKG is not ordered today.  The ekg ordered most recently demonstrates sinus rhythm, rate 96, less than 1 mm ST depressions in leads I, II, aVL, V5/6  TTE 07/06/19: 1. Left  ventricular ejection fraction, by estimation, is 35 to 40%. The  left ventricle has moderately decreased function. The left ventricle  demonstrates regional wall motion abnormalities (see scoring  diagram/findings for description). Left ventricular  diastolic parameters are indeterminate.  2. Right ventricular systolic function is normal. The right ventricular  size is normal.  3. Left atrial size was moderately dilated.  4. The mitral valve is normal in structure. Mild mitral valve  regurgitation. No evidence of mitral stenosis.  5. Indeterminate cusp structure. Given age and amount of calcification,  high suspicion for bicuspid valve, either functional or congenital.. The  aortic valve has an indeterminant number of cusps. Aortic valve  regurgitation is not visualized. Mild to  moderate aortic valve sclerosis/calcification is present, without any  evidence of aortic stenosis.  6. The inferior vena cava is normal in size with greater than 50%  respiratory variability, suggesting right atrial pressure of 3 mmHg.   RHC/LHC 07/09/19:  Mid RCA to Dist RCA lesion is 100% stenosed.  Acute Mrg lesion is 90% stenosed.  Ost RCA to Prox RCA lesion is 70% stenosed.  Prox RCA to Mid RCA lesion is 90% stenosed.  1st Mrg lesion is 85%  stenosed.  2nd Mrg-1 lesion is 100% stenosed.  2nd Mrg-2 lesion is 100% stenosed.  3rd Mrg lesion is 90% stenosed.  Dist Cx lesion is 70% stenosed.  Dist LAD lesion is 100% stenosed.  2nd Diag lesion is 95% stenosed.  Mid LAD lesion is 50% stenosed.  1st Diag-2 lesion is 40% stenosed.  1st Diag-1 lesion is 80% stenosed.  Mid Cx to Dist Cx lesion is 30% stenosed.   Severe diffuse multivessel CAD with moderate irregularity of the LAD with 80% proximal diagonal stenosis prior to a previously placed stent with intimal hyperplasia within the stented segment, 50% the stenosis, 95% stenosis in the second diagonal vessel and total occlusion of the apical LAD; left circumflex vessel with large OM1 vessel with 85% stenosis proximal to an aneurysmal segment, total occlusion of the OM 2 vessel with previously placed stent in this vessel and 90 and 70% bifurcation stenoses in the distal circumflex; severely diffusely diseased RCA with distal occlusion.  There is faint filling of a probable small PLA vessel via the left injection.  Normal right heart pressures.  LVEDP 16 mmHg.  RECOMMENDATION: Plan initiate medical therapy since at present he is not on any anti-ischemic medications.  Most vessels are not amenable to intervention; however if patient experiences increasing chest pain symptomatology the large OM1 vessel can potentially undergo intervention with stenting.  Aggressive lipid-lowering therapy with target LDL less than 70.  Optimal blood pressure control.  Smoking cessation is essential.  With diffuse CAD consider DAPT therapy.  Recent Labs: 02/24/2020: TSH 1.070 03/25/2020: ALT 77; B Natriuretic Peptide 67.8; Hemoglobin 13.1; Magnesium 1.8; Platelets 299 04/13/2020: BUN 14; Creatinine, Ser 0.88; Potassium 4.5; Sodium 139  Recent Lipid Panel    Component Value Date/Time   CHOL 280 (H) 02/24/2020 1221   TRIG 287 (H) 02/24/2020 1221   HDL 33 (L) 02/24/2020 1221   CHOLHDL 8.5 (H)  02/24/2020 1221   CHOLHDL 8.1 07/07/2019 0336   VLDL 29 07/07/2019 0336   LDLCALC 190 (H) 02/24/2020 1221    Physical Exam:    VS:  BP 116/73   Pulse 64   Ht  (1.753 m)   Wt 204 lb 3.2 oz (92.6 kg)   SpO2 99%   BMI 30.16 kg/m     Wt  Readings from Last 3 Encounters:  06/09/20 204 lb 3.2 oz (92.6 kg)  05/28/20 201 lb 3.2 oz (91.3 kg)  05/13/20 195 lb (88.5 kg)     GEN: in no acute distress HEENT: Normal NECK: No JVD CARDIAC:RRR, no murmurs, rubs, gallops RESPIRATORY:  Clear to auscultation without rales, wheezing or rhonchi  ABDOMEN: Soft, non-tender, non-distended MUSCULOSKELETAL:  No edema.  R arm in sling SKIN: Warm and dry NEUROLOGIC:  Alert and oriented x 3 PSYCHIATRIC:  Normal affect   ASSESSMENT:    1. Chronic combined systolic and diastolic congestive heart failure (HCC)   2. Coronary artery disease involving native coronary artery of native heart with angina pectoris (HCC)   3. Syncope and collapse   4. Hyperlipidemia, unspecified hyperlipidemia type   5. Tobacco use    PLAN:    CAD: Severe multivessel CAD not amenable to intervention on catheterization 07/09/2019.   -Continue aspirin 81 mg daily and Plavix 75 mg -Continue Toprol-XL 25 mg daily -Sublingual nitroglycerin as needed  Chronic combined systolic and diastolic heart failure: EF 35 to 40% on TTE 07/06/2019.  Echocardiogram on 03/25/2020 showed EF 45 to 50%, grade 1 diastolic dysfunction, normal RV function, no significant valvular disease.   -Continue Toprol-XL 100 mg daily -Continue losartan 25 mg daily -Continue spironolactone 12.5 mg daily -Check BMP -We will hold off on further medication titration due to lightheadedness   Syncope: Recent episode of lightheadedness/suspected syncope resulting in fall down stairs and found to have fractured humerus. Cardiac monitor on 05/22/20 showed NSVT x 6 seconds.  He was not orthostatic on presentation.  Given his severe multivessel CAD and systolic heart  failure, and considering NSVT on monitor, concern for ventricular arrhythmia as cause of his syncope.  Does not meet indication for ICD at this time, as his EF is greater than 35% and longest episode of NSVT was 6 seconds on cardiac monitor.  Recommend loop recorder for long-term monitoring.  Hyperlipidemia: LDL 190 on 02/24/2020 but had been off of statin.  Restarted atorvastatin 80 mg daily  Type 2 diabetes: A1c 12.2 07/05/2019, improved to 7.5 on 03/25/2020.  Continue insulin, follows with PCP  Tobacco use: Patient counseled on risks of tobacco use and cessation strongly encouraged.  He is going to try using nicotine replacement gum to help him quit  Lower extremity pain: Normal ABIs 03/04/2020  R humerus fracture: OK to hold plavix and proceed with surgery if recommended by orthopedic surgery, though patient reports surgery not recommended at this time   RTC in 2 months   Medication Adjustments/Labs and Tests Ordered: Current medicines are reviewed at length with the patient today.  Concerns regarding medicines are outlined above.  Orders Placed This Encounter  Procedures  . Basic metabolic panel   Meds ordered this encounter  Medications  . clopidogrel (PLAVIX) 75 MG tablet    Sig: Take 1 tablet (75 mg total) by mouth daily.    Dispense:  90 tablet    Refill:  3  . furosemide (LASIX) 20 MG tablet    Sig: Take 1 tablet (20 mg total) by mouth daily.    Dispense:  90 tablet    Refill:  3  . losartan (COZAAR) 25 MG tablet    Sig: Take 1 tablet (25 mg total) by mouth daily.    Dispense:  90 tablet    Refill:  3  . metoprolol succinate (TOPROL XL) 100 MG 24 hr tablet    Sig: Take 1 tablet (100 mg  total) by mouth daily. Take with or immediately following a meal.    Dispense:  90 tablet    Refill:  3    Dose increase  . spironolactone (ALDACTONE) 25 MG tablet    Sig: Take 0.5 tablets (12.5 mg total) by mouth daily.    Dispense:  45 tablet    Refill:  3    Patient Instructions   Medication Instructions:  Your physician recommends that you continue on your current medications as directed. Please refer to the Current Medication list given to you today.  *If you need a refill on your cardiac medications before your next appointment, please call your pharmacy*   Lab Work: BMET today  If you have labs (blood work) drawn today and your tests are completely normal, you will receive your results only by: Marland Kitchen MyChart Message (if you have MyChart) OR . A paper copy in the mail If you have any lab test that is abnormal or we need to change your treatment, we will call you to review the results.   Testing/Procedures: Loop recorder implant-please call when you return so we can get this scheduled.  Follow-Up: At [facility], you and your health needs are our priority.  As part of our continuing mission to provide you with exceptional heart care, we have created designated Provider Care Teams.  These Care Teams include your primary Cardiologist (physician) and Advanced Practice Providers (APPs -  Physician Assistants and Nurse Practitioners) who all work together to provide you with the care you need, when you need it.  We recommend signing up for the patient portal called "MyChart".  Sign up information is provided on this After Visit Summary.  MyChart is used to connect with patients for Virtual Visits (Telemedicine).  Patients are able to view lab/test results, encounter notes, upcoming appointments, etc.  Non-urgent messages can be sent to your provider as well.   To learn more about what you can do with MyChart, go to [email or web address].    Your next appointment:   2 month(s)  The format for your next appointment:   In Person  Provider:   Epifanio Lesches, MD        Signed, Little Ishikawa, MD  06/09/2020 11:34 PM    [facility]

## 2020-06-09 ENCOUNTER — Ambulatory Visit (INDEPENDENT_AMBULATORY_CARE_PROVIDER_SITE_OTHER): Payer: Medicaid Other | Admitting: Cardiology

## 2020-06-09 ENCOUNTER — Encounter: Payer: Self-pay | Admitting: Cardiology

## 2020-06-09 ENCOUNTER — Other Ambulatory Visit: Payer: Self-pay

## 2020-06-09 ENCOUNTER — Encounter (INDEPENDENT_AMBULATORY_CARE_PROVIDER_SITE_OTHER): Payer: Self-pay | Admitting: Primary Care

## 2020-06-09 VITALS — BP 116/73 | HR 64 | Ht 69.0 in | Wt 204.2 lb

## 2020-06-09 DIAGNOSIS — Z720 Tobacco use: Secondary | ICD-10-CM

## 2020-06-09 DIAGNOSIS — I25119 Atherosclerotic heart disease of native coronary artery with unspecified angina pectoris: Secondary | ICD-10-CM | POA: Diagnosis not present

## 2020-06-09 DIAGNOSIS — E785 Hyperlipidemia, unspecified: Secondary | ICD-10-CM | POA: Diagnosis not present

## 2020-06-09 DIAGNOSIS — R55 Syncope and collapse: Secondary | ICD-10-CM

## 2020-06-09 DIAGNOSIS — I5042 Chronic combined systolic (congestive) and diastolic (congestive) heart failure: Secondary | ICD-10-CM | POA: Diagnosis not present

## 2020-06-09 MED ORDER — CLOPIDOGREL BISULFATE 75 MG PO TABS: 75.0000 mg | ORAL_TABLET | Freq: Every day | ORAL | 3 refills | Status: DC

## 2020-06-09 MED ORDER — METOPROLOL SUCCINATE ER 100 MG PO TB24: 100.0000 mg | ORAL_TABLET | Freq: Every day | ORAL | 3 refills | Status: DC

## 2020-06-09 MED ORDER — FUROSEMIDE 20 MG PO TABS: 20.0000 mg | ORAL_TABLET | Freq: Every day | ORAL | 3 refills | Status: DC

## 2020-06-09 MED ORDER — SPIRONOLACTONE 25 MG PO TABS
12.5000 mg | ORAL_TABLET | Freq: Every day | ORAL | 3 refills | Status: DC
Start: 2020-06-09 — End: 2020-11-11

## 2020-06-09 MED ORDER — LOSARTAN POTASSIUM 25 MG PO TABS: 25.0000 mg | ORAL_TABLET | Freq: Every day | ORAL | 3 refills | Status: DC

## 2020-06-09 NOTE — Patient Instructions (Signed)
Medication Instructions:  Your physician recommends that you continue on your current medications as directed. Please refer to the Current Medication list given to you today.  *If you need a refill on your cardiac medications before your next appointment, please call your pharmacy*   Lab Work: BMET today  If you have labs (blood work) drawn today and your tests are completely normal, you will receive your results only by: Marland Kitchen MyChart Message (if you have MyChart) OR . A paper copy in the mail If you have any lab test that is abnormal or we need to change your treatment, we will call you to review the results.   Testing/Procedures: Loop recorder implant-please call when you return so we can get this scheduled.  Follow-Up: At [facility], you and your health needs are our priority.  As part of our continuing mission to provide you with exceptional heart care, we have created designated Provider Care Teams.  These Care Teams include your primary Cardiologist (physician) and Advanced Practice Providers (APPs -  Physician Assistants and Nurse Practitioners) who all work together to provide you with the care you need, when you need it.  We recommend signing up for the patient portal called "MyChart".  Sign up information is provided on this After Visit Summary.  MyChart is used to connect with patients for Virtual Visits (Telemedicine).  Patients are able to view lab/test results, encounter notes, upcoming appointments, etc.  Non-urgent messages can be sent to your provider as well.   To learn more about what you can do with MyChart, go to [email or web address].    Your next appointment:   2 month(s)  The format for your next appointment:   In Person  Provider:   Epifanio Lesches, MD

## 2020-06-10 LAB — BASIC METABOLIC PANEL
BUN/Creatinine Ratio: 13 (ref 9–20)
BUN: 12 mg/dL (ref 6–24)
CO2: 24 mmol/L (ref 20–29)
Calcium: 10 mg/dL (ref 8.7–10.2)
Chloride: 99 mmol/L (ref 96–106)
Creatinine, Ser: 0.92 mg/dL (ref 0.76–1.27)
Glucose: 157 mg/dL — ABNORMAL HIGH (ref 65–99)
Potassium: 4.4 mmol/L (ref 3.5–5.2)
Sodium: 141 mmol/L (ref 134–144)
eGFR: 98 mL/min/{1.73_m2} (ref >59)

## 2020-06-11 ENCOUNTER — Telehealth: Payer: Self-pay

## 2020-06-11 ENCOUNTER — Other Ambulatory Visit (INDEPENDENT_AMBULATORY_CARE_PROVIDER_SITE_OTHER): Payer: Self-pay | Admitting: Primary Care

## 2020-06-11 MED ORDER — SILDENAFIL CITRATE 25 MG PO TABS: 25.0000 mg | ORAL_TABLET | Freq: Every day | ORAL | 3 refills | Status: DC | PRN

## 2020-06-11 NOTE — Progress Notes (Signed)
Sent secured message to cardiologist Dr. Epifanio Lesches to clarify if Viagra was  [facility] to give. Yes -and he has not needed to  [facility] in a while.

## 2020-06-11 NOTE — Telephone Encounter (Signed)
Copied from CRM [id number]. Topic: General - Other >> Jun 11, 2020  2:33 PM Pawlus, Maxine Glenn A wrote: Reason for CRM: Pt was calling back to follow up on the message sent below: "Hi Mrs Marcelino Duster I talked to doctor Bjorn Pippin today about the pills to help my ED and he said it is ok for me to take. Would you please prescribe viagra. My new pharmacy is [facility] in [place]. Thank you so much!"  Pt wanted to know the status of his request.

## 2020-06-15 ENCOUNTER — Other Ambulatory Visit (INDEPENDENT_AMBULATORY_CARE_PROVIDER_SITE_OTHER): Payer: Self-pay | Admitting: Primary Care

## 2020-06-15 NOTE — Telephone Encounter (Signed)
sildenafil (VIAGRA) 25 MG tablet 10 tablet 3 06/11/2020    Sig - Route: Take 1 tablet (25 mg total) by mouth daily as needed for erectile dysfunction. - Oral   Sent to pharmacy as: sildenafil (VIAGRA) 25 MG tablet   E-Prescribing Status: Receipt confirmed by pharmacy (06/11/2020 11:08 PM EDT)    [id number] - [place], [place] - [address] Phone:  [phone]  Fax:  [phone]     Pt states that they need this just resent to [facility] as it is much cheaper as [facility] takes coupons.

## 2020-06-17 ENCOUNTER — Other Ambulatory Visit (INDEPENDENT_AMBULATORY_CARE_PROVIDER_SITE_OTHER): Payer: Self-pay | Admitting: *Deleted

## 2020-06-17 DIAGNOSIS — F5104 Psychophysiologic insomnia: Secondary | ICD-10-CM

## 2020-06-17 DIAGNOSIS — E1165 Type 2 diabetes mellitus with hyperglycemia: Secondary | ICD-10-CM

## 2020-06-17 DIAGNOSIS — Z760 Encounter for issue of repeat prescription: Secondary | ICD-10-CM

## 2020-06-17 MED ORDER — SILDENAFIL CITRATE 25 MG PO TABS: 25.0000 mg | ORAL_TABLET | Freq: Every day | ORAL | 3 refills | Status: DC | PRN

## 2020-06-17 NOTE — Telephone Encounter (Signed)
Requested medication (s) are due for refill today - change in pharmacy  Requested medication (s) are on the active medication list -yes  Future visit scheduled -no  Last refill: 1 month ago  Notes to clinic: Patient has requested RF on medications- he is going out to be traveling out of country- he wants to change pharmacy. [facility]. Rx from outside providers- with note defer RF to PCP.  Requested Prescriptions  Pending Prescriptions Disp Refills   insulin glargine (LANTUS) 100 UNIT/ML Solostar Pen 45 mL 3    Sig: Inject 25 Units into the skin 2 (two) times daily. Defer refills to PCP      Endocrinology:  Diabetes - Insulins Passed - 06/17/2020 10:25 AM      Passed - HBA1C is between 0 and 7.9 and within 180 days    Hgb A1c MFr Bld  Date Value Ref Range Status  03/25/2020 7.5 (H) 4.8 - 5.6 % Final    Comment:    (NOTE) Pre diabetes:          5.7%-6.4%  Diabetes:              >6.4%  Glycemic control for   <7.0% adults with diabetes           Passed - Valid encounter within last 6 months    Recent Outpatient Visits           2 weeks ago Urinary frequency   [facility] Gwinda Passe P, NP   2 months ago Generalized anxiety disorder   [facility] Mayers, Cari S, PA-C   3 months ago Generalized anxiety disorder   [facility] Gwinda Passe P, NP   10 months ago Tobacco abuse   [facility] Edwards, Michelle P, NP                  Insulin Pen Needle 32G X 4 MM MISC 400 each 3    Sig: 1 Device by Does not apply route in the morning, at noon, in the evening, and at bedtime.      Endocrinology: Diabetes - Testing Supplies Passed - 06/17/2020 10:25 AM      Passed - Valid encounter within last 12 months    Recent Outpatient Visits           2 weeks ago Urinary frequency   [facility] Grayce Sessions, NP   2 months ago Generalized  anxiety disorder   [facility] Mayers, Cari S, PA-C   3 months ago Generalized anxiety disorder   [facility] Grayce Sessions, NP   10 months ago Tobacco abuse   [facility] Edwards, Michelle P, NP                  insulin aspart (NOVOLOG FLEXPEN) 100 UNIT/ML FlexPen 30 mL 3    Sig: Inject 10 Units into the skin 3 (three) times daily with meals.      Endocrinology:  Diabetes - Insulins Passed - 06/17/2020 10:25 AM      Passed - HBA1C is between 0 and 7.9 and within 180 days    Hgb A1c MFr Bld  Date Value Ref Range Status  03/25/2020 7.5 (H) 4.8 - 5.6 % Final    Comment:    (NOTE) Pre diabetes:          5.7%-6.4%  Diabetes:              >  6.4%  Glycemic control for   <7.0% adults with diabetes           Passed - Valid encounter within last 6 months    Recent Outpatient Visits           2 weeks ago Urinary frequency   [facility] Grayce Sessions, NP   2 months ago Generalized anxiety disorder   [facility] Mayers, Cari S, PA-C   3 months ago Generalized anxiety disorder   [facility] Gwinda Passe P, NP   10 months ago Tobacco abuse   [facility] Gwinda Passe P, NP                  hydrOXYzine (ATARAX/VISTARIL) 50 MG tablet 30 tablet 0    Sig: Take 1/2- 1 full tab QHS PRN for insomnia      Ear, Nose, and Throat:  Antihistamines Passed - 06/17/2020 10:25 AM      Passed - Valid encounter within last 12 months    Recent Outpatient Visits           2 weeks ago Urinary frequency   [facility] Gwinda Passe P, NP   2 months ago Generalized anxiety disorder   [facility] Mayers, Cari S, PA-C   3 months ago Generalized anxiety disorder   [facility] Grayce Sessions, NP   10 months ago Tobacco abuse   [facility] Grayce Sessions, NP                 Signed Prescriptions Disp Refills   sildenafil (VIAGRA) 25 MG tablet 10 tablet 3    Sig: Take 1 tablet (25 mg total) by mouth daily as needed for erectile dysfunction.      Urology: Erectile Dysfunction Agents Passed - 06/17/2020 10:25 AM      Passed - Last BP in normal range    BP Readings from Last 1 Encounters:  06/09/20 116/73          Passed - Valid encounter within last 12 months    Recent Outpatient Visits           2 weeks ago Urinary frequency   [facility] Grayce Sessions, NP   2 months ago Generalized anxiety disorder   [facility] Mayers, Cari S, PA-C   3 months ago Generalized anxiety disorder   [facility] Grayce Sessions, NP   10 months ago Tobacco abuse   [facility] Grayce Sessions, NP                    Requested Prescriptions  Pending Prescriptions Disp Refills   insulin glargine (LANTUS) 100 UNIT/ML Solostar Pen 45 mL 3    Sig: Inject 25 Units into the skin 2 (two) times daily. Defer refills to PCP      Endocrinology:  Diabetes - Insulins Passed - 06/17/2020 10:25 AM      Passed - HBA1C is between 0 and 7.9 and within 180 days    Hgb A1c MFr Bld  Date Value Ref Range Status  03/25/2020 7.5 (H) 4.8 - 5.6 % Final    Comment:    (NOTE) Pre diabetes:          5.7%-6.4%  Diabetes:              >  6.4%  Glycemic control for   <7.0% adults with diabetes           Passed - Valid encounter within last 6 months    Recent Outpatient Visits           2 weeks ago Urinary frequency   [facility] Gwinda Passe P, NP   2 months ago Generalized anxiety disorder   [facility] Mayers, Cari S, PA-C   3 months ago Generalized anxiety disorder   [facility] Grayce Sessions, NP   10 months ago  Tobacco abuse   [facility] Edwards, Michelle P, NP                  Insulin Pen Needle 32G X 4 MM MISC 400 each 3    Sig: 1 Device by Does not apply route in the morning, at noon, in the evening, and at bedtime.      Endocrinology: Diabetes - Testing Supplies Passed - 06/17/2020 10:25 AM      Passed - Valid encounter within last 12 months    Recent Outpatient Visits           2 weeks ago Urinary frequency   [facility] Grayce Sessions, NP   2 months ago Generalized anxiety disorder   [facility] Mayers, Cari S, PA-C   3 months ago Generalized anxiety disorder   [facility] Grayce Sessions, NP   10 months ago Tobacco abuse   [facility] Edwards, Michelle P, NP                  insulin aspart (NOVOLOG FLEXPEN) 100 UNIT/ML FlexPen 30 mL 3    Sig: Inject 10 Units into the skin 3 (three) times daily with meals.      Endocrinology:  Diabetes - Insulins Passed - 06/17/2020 10:25 AM      Passed - HBA1C is between 0 and 7.9 and within 180 days    Hgb A1c MFr Bld  Date Value Ref Range Status  03/25/2020 7.5 (H) 4.8 - 5.6 % Final    Comment:    (NOTE) Pre diabetes:          5.7%-6.4%  Diabetes:              >6.4%  Glycemic control for   <7.0% adults with diabetes           Passed - Valid encounter within last 6 months    Recent Outpatient Visits           2 weeks ago Urinary frequency   [facility] Grayce Sessions, NP   2 months ago Generalized anxiety disorder   [facility] Mayers, Cari S, PA-C   3 months ago Generalized anxiety disorder   [facility] Gwinda Passe P, NP   10 months ago Tobacco abuse   [facility] Gwinda Passe P, NP                  hydrOXYzine (ATARAX/VISTARIL) 50 MG tablet 30 tablet 0    Sig: Take 1/2- 1  full tab QHS PRN for insomnia      Ear, Nose, and Throat:  Antihistamines Passed - 06/17/2020 10:25 AM      Passed - Valid encounter within last 12  months    Recent Outpatient Visits           2 weeks ago Urinary frequency   [facility] SessionsEdwards[facility] Michelle P, NP   2 months ago Generalized anxiety disorder   [facility] Mayers, Cari S, PA-C   3 months ago Generalized anxiety disorder   [facility] Grayce SessionsEdwards, Michelle P, NP   10 months ago Tobacco abuse   [facility] Grayce SessionsEdwards, Michelle P, NP                 Signed Prescriptions Disp Refills   sildenafil (VIAGRA) 25 MG tablet 10 tablet 3    Sig: Take 1 tablet (25 mg total) by mouth daily as needed for erectile dysfunction.      Urology: Erectile Dysfunction Agents Passed - 06/17/2020 10:25 AM      Passed - Last BP in normal range    BP Readings from Last 1 Encounters:  06/09/20 116/73          Passed - Valid encounter within last 12 months    Recent Outpatient Visits           2 weeks ago Urinary frequency   [facility] Grayce SessionsEdwards, Michelle P, NP   2 months ago Generalized anxiety disorder   [facility] Mayers, Cari S, PA-C   3 months ago Generalized anxiety disorder   [facility] Grayce SessionsEdwards, Michelle P, NP   10 months ago Tobacco abuse   [facility] Gray[facility] SessionsEdwards, Michelle P, NP

## 2020-06-17 NOTE — Telephone Encounter (Signed)
Patient is calling with interpreter: [name]: He is requesting a refill on medication- and trasfer to different pharmacy. [facility] is now preferred pharmacy. Patient is getting ready to travel and is requesting RF of medications.

## 2020-06-20 ENCOUNTER — Other Ambulatory Visit: Payer: Self-pay

## 2020-06-21 ENCOUNTER — Other Ambulatory Visit: Payer: Self-pay

## 2020-06-21 NOTE — Telephone Encounter (Signed)
Refills already sent

## 2020-06-25 DIAGNOSIS — Z0271 Encounter for disability determination: Secondary | ICD-10-CM

## 2020-08-04 ENCOUNTER — Telehealth: Payer: Self-pay | Admitting: Internal Medicine

## 2020-08-04 ENCOUNTER — Other Ambulatory Visit: Payer: Self-pay

## 2020-08-04 NOTE — Telephone Encounter (Signed)
Ron from [employer]  is needing to know if the patient was doing two or more injections a day.

## 2020-08-04 NOTE — Telephone Encounter (Signed)
Did not even have chance to call and insurance denied PA

## 2020-08-12 ENCOUNTER — Telehealth: Payer: Self-pay | Admitting: Internal Medicine

## 2020-08-12 ENCOUNTER — Other Ambulatory Visit: Payer: Self-pay

## 2020-08-12 ENCOUNTER — Encounter: Payer: Self-pay | Admitting: Internal Medicine

## 2020-08-12 ENCOUNTER — Ambulatory Visit (INDEPENDENT_AMBULATORY_CARE_PROVIDER_SITE_OTHER): Payer: Medicaid Other | Admitting: Internal Medicine

## 2020-08-12 VITALS — BP 130/84 | HR 61 | Ht 69.0 in | Wt 206.0 lb

## 2020-08-12 DIAGNOSIS — E1159 Type 2 diabetes mellitus with other circulatory complications: Secondary | ICD-10-CM | POA: Diagnosis not present

## 2020-08-12 DIAGNOSIS — Z794 Long term (current) use of insulin: Secondary | ICD-10-CM

## 2020-08-12 DIAGNOSIS — Z760 Encounter for issue of repeat prescription: Secondary | ICD-10-CM | POA: Diagnosis not present

## 2020-08-12 DIAGNOSIS — E785 Hyperlipidemia, unspecified: Secondary | ICD-10-CM | POA: Diagnosis not present

## 2020-08-12 DIAGNOSIS — E1165 Type 2 diabetes mellitus with hyperglycemia: Secondary | ICD-10-CM | POA: Diagnosis not present

## 2020-08-12 LAB — POCT GLYCOSYLATED HEMOGLOBIN (HGB A1C): Hemoglobin A1C: 8 % — AB (ref 4.0–5.6)

## 2020-08-12 LAB — POCT GLUCOSE (DEVICE FOR HOME USE): POC Glucose: 160 mg/dl — AB (ref 70–99)

## 2020-08-12 MED ORDER — DEXCOM G6 TRANSMITTER MISC: 1.0000 | 3 refills | Status: DC

## 2020-08-12 MED ORDER — DEXCOM G6 SENSOR MISC: 1.0000 | 11 refills | Status: DC

## 2020-08-12 MED ORDER — METFORMIN HCL 1000 MG PO TABS: 1000.0000 mg | ORAL_TABLET | Freq: Every day | ORAL | 3 refills | Status: DC

## 2020-08-12 MED ORDER — BASAGLAR KWIKPEN 100 UNIT/ML ~~LOC~~ SOPN: 25.0000 [IU] | PEN_INJECTOR | Freq: Two times a day (BID) | SUBCUTANEOUS | 3 refills | Status: DC

## 2020-08-12 MED ORDER — INSULIN ASPART 100 UNIT/ML FLEXPEN
10.0000 [IU] | PEN_INJECTOR | Freq: Three times a day (TID) | SUBCUTANEOUS | 3 refills | Status: DC
Start: 2020-08-12 — End: 2021-12-21

## 2020-08-12 NOTE — Progress Notes (Signed)
Name: Larry Lewis  Age/ Sex: 55 y.o., male   MRN/ DOB: [id number], December 21, 1965     PCP: Grayce Sessions, NP   Reason for Endocrinology Evaluation: Type 2 Diabetes Mellitus  Initial Endocrine Consultative Visit: 05/13/2020    PATIENT IDENTIFIER: Larry Lewis is a 55 y.o. male with a past medical history of T2DM, CAD, Asthma and anxiety . The patient has followed with Endocrinology clinic since 05/13/2020 for consultative assistance with management of his diabetes.  DIABETIC HISTORY:  Larry Lewis was diagnosed with DM in 2007,has been on Glipizide in the past, Has been on insulin for yrs due to hyperglycemia. His hemoglobin A1c has ranged from 7.5% in 2022, peaking at 12.2% in 2021.  On his initial visit to our clinic his A1c was 7.5%, we continued Metformin and MDI regimen and started Trulicity. We opted to avoid SGLT-2 inhibitors due to BPH symptoms   SUBJECTIVE:   During the last visit (05/13/2020): A1c 7.5% we continued Metformin and MDI regimen and started Trulicity  Today (08/12/2020): Larry Lewis is here for a follow up on diabetes management.  He checks his blood sugars occasionally . The patient has not had hypoglycemic episodes since the last clinic visit, he did not bring a meter today    Denies nausea , has alternating constipation or diarrhea   HOME DIABETES REGIMEN:  Metformin 1000 mg , 1 tablet daily Lantus 25 units Twice daily  Novolog 10 units with each meal  Trulicity 0.75 mg weekly     Statin: yes ACE-I/ARB: yes Prior Diabetic Education: no   METER DOWNLOAD SUMMARY: Did not bring    DIABETIC COMPLICATIONS: Microvascular complications:   Denies: CKD, neuropathy, retinopathy Last eye exam: Completed a while ago   Macrovascular complications:  CAD ( S/P PCI) Denies:  PVD, CVA   HISTORY:  Past Medical History:  Past Medical History:  Diagnosis Date  . AMI (acute myocardial infarction) (HCC)   . Anxiety   . Asthma   . Coronary artery disease    . Fall at home, initial encounter 03/24/2020  . High cholesterol   . Hypertension    Past Surgical History:  Past Surgical History:  Procedure Laterality Date  . CARDIAC CATHETERIZATION    . RIGHT/LEFT HEART CATH AND CORONARY ANGIOGRAPHY N/A 07/09/2019   Procedure: RIGHT/LEFT HEART CATH AND CORONARY ANGIOGRAPHY;  Surgeon: Lennette Bihari, MD;  Location: [facility];  Service: Cardiovascular;  Laterality: N/A;   Social History:  reports that he has been smoking cigarettes. He has a 0.50 pack-year smoking history. He has never used smokeless tobacco. He reports that he does not drink alcohol and does not use drugs. Family History:  Family History  Problem Relation Age of Onset  . Other Neg Hx      HOME MEDICATIONS: Allergies as of 08/12/2020      Reactions   Penicillins Itching      Medication List       Accurate as of Aug 12, 2020  3:19 PM. If you have any questions, ask your nurse or doctor.        aspirin EC 81 MG tablet Take 81 mg by mouth daily. Swallow whole.   atorvastatin 80 MG tablet Commonly known as: LIPITOR TAKE 1 TABLET (80 MG TOTAL) BY MOUTH DAILY.   Basaglar KwikPen 100 UNIT/ML Inject 25 Units into the skin 2 (two) times daily. What changed:  how much to take how to take this when to take this Another medication with  the same name was removed. Continue taking this medication, and follow the directions you see here. Changed by: Scarlette Shorts, MD   clopidogrel 75 MG tablet Commonly known as: PLAVIX Take 1 tablet (75 mg total) by mouth daily.   Dexcom G6 Sensor Misc 1 Device by Does not apply route as directed.   Dexcom G6 Transmitter Misc 1 Device by Does not apply route as directed.   Dulaglutide 0.75 MG/0.5ML Sopn INJECT 0.75 MG INTO THE SKIN ONCE A WEEK.   furosemide 20 MG tablet Commonly known as: LASIX TAKE 1 TABLET (20 MG TOTAL) BY MOUTH DAILY.   furosemide 20 MG tablet Commonly known as: LASIX Take 1 tablet (20 mg  total) by mouth daily.   guaiFENesin 600 MG 12 hr tablet Commonly known as: MUCINEX TAKE 1 TABLET (600 MG TOTAL) BY MOUTH 2 (TWO) TIMES DAILY AS NEEDED FOR TO LOOSEN PHLEGM.   hydrOXYzine 50 MG tablet Commonly known as: ATARAX/VISTARIL TAKE 1/2- 1 TABLET BY MOUTH AT BEDTIME AS NEEDED FOR INSOMNIA   insulin aspart 100 UNIT/ML FlexPen Commonly known as: NOVOLOG Inject 10 Units into the skin 3 (three) times daily with meals. What changed:  how much to take how to take this when to take this Changed by: Scarlette Shorts, MD   Insulin Pen Needle 32G X 4 MM Misc USE AS DIRECTED IN THE MORNING, AT NOON, IN THE EVENING, AND AT BEDTIME.   losartan 25 MG tablet Commonly known as: COZAAR Take 1 tablet (25 mg total) by mouth daily.   metFORMIN 1000 MG tablet Commonly known as: GLUCOPHAGE Take 1 tablet (1,000 mg total) by mouth daily. What changed:  how much to take how to take this when to take this Changed by: Scarlette Shorts, MD   metoprolol succinate 100 MG 24 hr tablet Commonly known as: Toprol XL Take 1 tablet (100 mg total) by mouth daily. Take with or immediately following a meal.   nitroGLYCERIN 0.4 MG SL tablet Commonly known as: NITROSTAT PLACE 1 TABLET (0.4 MG TOTAL) UNDER THE TONGUE EVERY 5 (FIVE) MINUTES X 3 DOSES AS NEEDED FOR CHEST PAIN.   polyethylene glycol powder 17 GM/SCOOP powder Commonly known as: GLYCOLAX/MIRALAX Take 17 g by mouth 2 (two) times daily as needed.   polyethylene glycol 17 g packet Commonly known as: MIRALAX / GLYCOLAX TAKE 17 G BY MOUTH 2 (TWO) TIMES DAILY AS NEEDED.   sertraline 50 MG tablet Commonly known as: ZOLOFT TAKE 1 TABLET (50 MG TOTAL) BY MOUTH DAILY.   sildenafil 25 MG tablet Commonly known as: Viagra Take 1 tablet (25 mg total) by mouth daily as needed for erectile dysfunction.   spironolactone 25 MG tablet Commonly known as: ALDACTONE Take 0.5 tablets (12.5 mg total) by mouth daily.        OBJECTIVE:    Vital Signs: BP 130/84   Pulse 61   Ht 5\' 9"  (1.753 m)   Wt 206 lb (93.4 kg)   SpO2 98%   BMI 30.42 kg/m   Wt Readings from Last 3 Encounters:  08/12/20 206 lb (93.4 kg)  06/09/20 204 lb 3.2 oz (92.6 kg)  05/28/20 201 lb 3.2 oz (91.3 kg)     Exam: General: Pt appears well and is in NAD  Lungs: Clear with good BS bilat with no rales, rhonchi, or wheezes  Heart: RRR   Abdomen: Normoactive bowel sounds, soft, nontender, without masses or organomegaly palpable  Extremities: No pretibial edema.   Neuro: MS is good  with appropriate affect, pt is alert and Ox3          DATA REVIEWED:  Lab Results  Component Value Date   HGBA1C 8.0 (A) 08/12/2020   HGBA1C 7.5 (H) 03/25/2020   HGBA1C 8.8 (H) 02/24/2020   Lab Results  Component Value Date   LDLCALC 190 (H) 02/24/2020   CREATININE 0.92 06/09/2020   No results found for: [facility]   Lab Results  Component Value Date   CHOL 280 (H) 02/24/2020   HDL 33 (L) 02/24/2020   LDLCALC 190 (H) 02/24/2020   TRIG 287 (H) 02/24/2020   CHOLHDL 8.5 (H) 02/24/2020         ASSESSMENT / PLAN / RECOMMENDATIONS:   1) Type 2 Diabetes Mellitus, poorly controlled, With macrovascular complications - Most recent A1c of 8.0%. Goal A1c < 7.0 %.    -Patient continues with hyperglycemia, I have prescribed Trulicity 3 months ago and he only picked it up 4 days ago.  Today he was shown how to inject himself with Trulicity and he received his first dose of it in the office. -I also prescribe Dexcom for him back in February but today he is really not sure of why he does not have it -I have also referred him to an ophthalmologist as he is past due for an eye exam, but he tells me he has missed that [place] as [place] was [place].  A new appointment will be attempted -I have encouraged him to continue Trulicity as well as his MDI regimen    MEDICATIONS: - Continue Metformin 1000 mg , 1 tablet daily - Continue Lantus/ Basaglar  25 units Twice  daily  - Continue  Novolog 10 units with each meal  - Start Trulicity 0.75 mg weekly   EDUCATION / INSTRUCTIONS: BG monitoring instructions: Patient is instructed to check his blood sugars 3 times a day, before meals. Call [place] [facility] if: BG persistently < 70  I reviewed the Rule of 15 for the treatment of hypoglycemia in detail with the patient. Literature supplied.   2) Diabetic complications:  Eye: Does not have known diabetic retinopathy.  Neuro/ Feet: Does not have known diabetic peripheral neuropathy .  Renal: Patient does not have known baseline CKD. He   is on an ACEI/ARB at present.   3)  Dyslipidemia:    - LDL as high as 211 in 06/2019.  I have refilled his atorvastatin in February 2022 - Emphasized the importance of compliance with statin intake    Medication Continue atorvastatin 80 mg daily    F/U in 4 months   Signed electronically by: Lyndle Herrlich, MD  [facility] Endocrinology  [facility] [address] [place], [place] [address] Phone: [phone] FAX: [phone]   CC: Grayce Sessions, NP [address] [place] [address] Phone: [phone]  Fax: [phone]  Return to Endocrinology clinic as below: Future Appointments  Date Time Provider Department Center  12/16/2020  9:30 AM Emanuel Campos, Konrad Dolores, MD [id number] None

## 2020-08-12 NOTE — Patient Instructions (Signed)
-   Continue Metformin 1000 mg , 1 tablet daily - Continue Lantus/ Basaglar  25 units Twice daily  - Continue  Novolog 10 units with each meal  - Start Trulicity 0.75 mg weekly      HOW TO TREAT LOW BLOOD SUGARS (Blood sugar LESS THAN 70 MG/DL)  Please follow the RULE OF 15 for the treatment of hypoglycemia treatment (when your (blood sugars are less than 70 mg/dL)    STEP 1: Take 15 grams of carbohydrates when your blood sugar is low, which includes:   3-4 GLUCOSE TABS  OR  3-4 OZ OF JUICE OR REGULAR SODA OR  ONE TUBE OF GLUCOSE GEL     STEP 2: RECHECK blood sugar in 15 MINUTES STEP 3: If your blood sugar is still low at the 15 minute recheck --> then, go back to STEP 1 and treat AGAIN with another 15 grams of carbohydrates.

## 2020-08-12 NOTE — Telephone Encounter (Signed)
Can you please reschedule his ophthalmology appointment ? He missed the last one as he was out of the country and didn't remember where he was scheduled at     Thanks   Abby Raelyn Mora, MD  [facility] Endocrinology  [facility] [address] [place], [place] [address] Phone: [phone] FAX: [phone]

## 2020-08-18 ENCOUNTER — Encounter: Payer: Self-pay | Admitting: *Deleted

## 2020-10-04 NOTE — Progress Notes (Deleted)
Cardiology Office Note:    Date:  10/04/2020   ID:  Larry Lewis, DOB 26-Apr-1965, MRN [id number]  PCP:  Grayce Sessions, NP  Cardiologist:  Little Ishikawa, MD  Electrophysiologist:  None   Referring MD: Grayce Sessions, NP   No chief complaint on file.   History of Present Illness:    Larry Lewis is a 55 y.o. male with a hx of severe multivessel CAD, combined systolic and diastolic heart failure, diabetes, hypertension, hyperlipidemia, asthma, tobacco use who presents as a hospital follow-up.  He was admitted to [facility] on 07/05/2019 with acute combined systolic and diastolic heart failure.  He reported a history of CAD, had 2 stents placed in 2007 in [place].  He had not followed with a cardiologist since that time.  He presented with volume overload, and was treated with IV Lasix.  TTE on 07/06/2019 showed LVEF 35 to 40%, normal RV function, possible bicuspid aortic valve (no AI or AS).  He underwent LHC/RHC on 4/20, which showed severe diffuse multivessel CAD, medical management recommended.  Right heart pressures were normal.  In addition he was diagnosed with type 2 diabetes, A1c 12.2%.  Also found to have LDL 211.  He was lost to follow-up from April through December 2021 as return to [place] as both of his parents were ill.  He was off all his medications for several months.  He was admitted to [facility] from 1/4 through 03/26/2020 after presenting with possible syncope and fall leading to humerus fracture.  He was walking down the stairs at his friend's restaurant and had an episode of lightheadedness and possible sudden loss of consciousness causing him to fall down the stairs and suffer right humeral neck fracture.  Orthostatics were negative.  Echocardiogram on 03/25/2020 showed EF 45 to 50%, grade 1 diastolic dysfunction, normal RV function, no significant valvular disease.  He declined surgery and follow-up with orthopedics was arranged.  Cardiac monitor on 05/22/20 showed NSVT x 6  seconds.  Since last clinic visit,  he reports that he is doing okay.  Continues to have intermittent lightheadedness, particularly with standing or moving his head certain ways.  Denies any further syncopal episodes.  Does report he has had chest pain a few times per week, but short duration and has not had to take any nitroglycerin.  Does report he feels short of breath.  He denies any lower extremity edema.  Smokes about 10 cigarettes/day.  Denies any palpitations.  He has not been exercising.  Weight is up 13 pounds.   Wt Readings from Last 3 Encounters:  08/12/20 206 lb (93.4 kg)  06/09/20 204 lb 3.2 oz (92.6 kg)  05/28/20 201 lb 3.2 oz (91.3 kg)     Past Medical History:  Diagnosis Date   AMI (acute myocardial infarction) (HCC)    Anxiety    Asthma    Coronary artery disease    Fall at home, initial encounter 03/24/2020   High cholesterol    Hypertension     Past Surgical History:  Procedure Laterality Date   CARDIAC CATHETERIZATION     RIGHT/LEFT HEART CATH AND CORONARY ANGIOGRAPHY N/A 07/09/2019   Procedure: RIGHT/LEFT HEART CATH AND CORONARY ANGIOGRAPHY;  Surgeon: Lennette Bihari, MD;  Location: [facility] INVASIVE CV LAB;  Service: Cardiovascular;  Laterality: N/A;    Current Medications: No outpatient medications have been marked as taking for the 10/08/20 encounter (Appointment) with Little Ishikawa, MD.     Allergies:   Penicillins  Social History   Socioeconomic History   Marital status: Single    Spouse name: Not on file   Number of children: Not on file   Years of education: Not on file   Highest education level: Not on file  Occupational History   Not on file  Tobacco Use   Smoking status: Every Day    Packs/day: 1.00    Years: 0.50    Pack years: 0.50    Types: Cigarettes   Smokeless tobacco: Never  Vaping Use   Vaping Use: Never used  Substance and Sexual Activity   Alcohol use: Never   Drug use: Never   Sexual activity: Not on file  Other  Topics Concern   Not on file  Social History Narrative   Not on file   Social Determinants of Health   Financial Resource Strain: High Risk   Difficulty of Paying Living Expenses: Hard  Food Insecurity: No Food Insecurity   Worried About Running Out of Food in the Last Year: Never true   Ran Out of Food in the Last Year: Never true  Transportation Needs: No Transportation Needs   Lack of Transportation (Medical): No   Lack of Transportation (Non-Medical): No  Physical Activity: Not on file  Stress: Not on file  Social Connections: Not on file     Family History: The patient's family history is negative for Other.  ROS:   Please see the history of present illness.     All other systems reviewed and are negative.  EKGs/Labs/Other Studies Reviewed:    The following studies were reviewed today:   EKG:  EKG is not ordered today.  The ekg ordered most recently demonstrates sinus rhythm, rate 96, less than 1 mm ST depressions in leads I, II, aVL, V5/6  TTE 07/06/19:  1. Left ventricular ejection fraction, by estimation, is 35 to 40%. The  left ventricle has moderately decreased function. The left ventricle  demonstrates regional wall motion abnormalities (see scoring  diagram/findings for description). Left ventricular   diastolic parameters are indeterminate.   2. Right ventricular systolic function is normal. The right ventricular  size is normal.   3. Left atrial size was moderately dilated.   4. The mitral valve is normal in structure. Mild mitral valve  regurgitation. No evidence of mitral stenosis.   5. Indeterminate cusp structure. Given age and amount of calcification,  high suspicion for bicuspid valve, either functional or congenital.. The  aortic valve has an indeterminant number of cusps. Aortic valve  regurgitation is not visualized. Mild to  moderate aortic valve sclerosis/calcification is present, without any  evidence of aortic stenosis.   6. The inferior  vena cava is normal in size with greater than 50%  respiratory variability, suggesting right atrial pressure of 3 mmHg.   RHC/LHC 07/09/19: Mid RCA to Dist RCA lesion is 100% stenosed. Acute Mrg lesion is 90% stenosed. Ost RCA to Prox RCA lesion is 70% stenosed. Prox RCA to Mid RCA lesion is 90% stenosed. 1st Mrg lesion is 85% stenosed. 2nd Mrg-1 lesion is 100% stenosed. 2nd Mrg-2 lesion is 100% stenosed. 3rd Mrg lesion is 90% stenosed. Dist Cx lesion is 70% stenosed. Dist LAD lesion is 100% stenosed. 2nd Diag lesion is 95% stenosed. Mid LAD lesion is 50% stenosed. 1st Diag-2 lesion is 40% stenosed. 1st Diag-1 lesion is 80% stenosed. Mid Cx to Dist Cx lesion is 30% stenosed.   Severe diffuse multivessel CAD with moderate irregularity of the LAD with 80% proximal  diagonal stenosis prior to a previously placed stent with intimal hyperplasia within the stented segment, 50% the stenosis, 95% stenosis in the second diagonal vessel and total occlusion of the apical LAD; left circumflex vessel with large OM1 vessel with 85% stenosis proximal to an aneurysmal segment, total occlusion of the OM 2 vessel with previously placed stent in this vessel and 90 and 70% bifurcation stenoses in the distal circumflex; severely diffusely diseased RCA with distal occlusion.  There is faint filling of a probable small PLA vessel via the left injection.   Normal right heart pressures.   LVEDP 16 mmHg.   RECOMMENDATION: Plan initiate medical therapy since at present he is not on any anti-ischemic medications.  Most vessels are not amenable to intervention; however if patient experiences increasing chest pain symptomatology the large OM1 vessel can potentially undergo intervention with stenting.  Aggressive lipid-lowering therapy with target LDL less than 70.  Optimal blood pressure control.  Smoking cessation is essential.  With diffuse CAD consider DAPT therapy.  Recent Labs: 02/24/2020: TSH 1.070 03/25/2020: ALT  77; B Natriuretic Peptide 67.8; Hemoglobin 13.1; Magnesium 1.8; Platelets 299 06/09/2020: BUN 12; Creatinine, Ser 0.92; Potassium 4.4; Sodium 141  Recent Lipid Panel    Component Value Date/Time   CHOL 280 (H) 02/24/2020 1221   TRIG 287 (H) 02/24/2020 1221   HDL 33 (L) 02/24/2020 1221   CHOLHDL 8.5 (H) 02/24/2020 1221   CHOLHDL 8.1 07/07/2019 0336   VLDL 29 07/07/2019 0336   LDLCALC 190 (H) 02/24/2020 1221    Physical Exam:    VS:  There were no vitals taken for this visit.    Wt Readings from Last 3 Encounters:  08/12/20 206 lb (93.4 kg)  06/09/20 204 lb 3.2 oz (92.6 kg)  05/28/20 201 lb 3.2 oz (91.3 kg)     GEN: in no acute distress HEENT: Normal NECK: No JVD CARDIAC:RRR, no murmurs, rubs, gallops RESPIRATORY:  Clear to auscultation without rales, wheezing or rhonchi  ABDOMEN: Soft, non-tender, non-distended MUSCULOSKELETAL:  No edema.  R arm in sling SKIN: Warm and dry NEUROLOGIC:  Alert and oriented x 3 PSYCHIATRIC:  Normal affect   ASSESSMENT:    No diagnosis found.  PLAN:    CAD: Severe multivessel CAD not amenable to intervention on catheterization 07/09/2019.   -Continue aspirin 81 mg daily and Plavix 75 mg -Continue Toprol-XL 25 mg daily -Sublingual nitroglycerin as needed  Chronic combined systolic and diastolic heart failure: EF 35 to 40% on TTE 07/06/2019.  Echocardiogram on 03/25/2020 showed EF 45 to 50%, grade 1 diastolic dysfunction, normal RV function, no significant valvular disease.   -Continue Toprol-XL 100 mg daily -Continue losartan 25 mg daily -Continue spironolactone 12.5 mg daily -Check BMP -We will hold off on further medication titration due to lightheadedness   Syncope: Recent episode of lightheadedness/suspected syncope resulting in fall down stairs and found to have fractured humerus. Cardiac monitor on 05/22/20 showed NSVT x 6 seconds.  He was not orthostatic on presentation.  Given his severe multivessel CAD and systolic heart failure, and  considering NSVT on monitor, concern for ventricular arrhythmia as cause of his syncope.  Does not meet indication for ICD at this time, as his EF is greater than 35% and longest episode of NSVT was 6 seconds on cardiac monitor.  Recommend loop recorder for long-term monitoring.  Hyperlipidemia: LDL 190 on 02/24/2020 but had been off of statin.  Restarted atorvastatin 80 mg daily  Type 2 diabetes: A1c 12.2 07/05/2019, improved to  7.5 on 03/25/2020.  Continue insulin, follows with endocrinology  Tobacco use: Patient counseled on risks of tobacco use and cessation strongly encouraged.  He is going to try using nicotine replacement gum to help him quit  Lower extremity pain: Normal ABIs 03/04/2020  R humerus fracture: OK to hold plavix and proceed with surgery if recommended by orthopedic surgery, though patient reports surgery not recommended at this time   RTC in ***   Medication Adjustments/Labs and Tests Ordered: Current medicines are reviewed at length with the patient today.  Concerns regarding medicines are outlined above.  No orders of the defined types were placed in this encounter.  No orders of the defined types were placed in this encounter.   There are no Patient Instructions on file for this visit.   Signed, Little Ishikawa, MD  10/04/2020 8:44 PM    [facility]

## 2020-10-07 NOTE — Progress Notes (Signed)
Cardiology Office Note:    Date:  10/08/2020   ID:  Larry Lewis, DOB 09/12/1965, MRN [id number]  PCP:  Grayce SessionsEdwards, Michelle P, NP  Cardiologist:  Little Ishikawahristopher L Elysse Polidore, MD  Electrophysiologist:  None   Referring MD: Grayce SessionsEdwards, Michelle P, NP   Chief Complaint  Patient presents with   Congestive Heart Failure     History of Present Illness:    Larry Lewis is a 55 y.o. male with a hx of severe multivessel CAD, combined systolic and diastolic heart failure, diabetes, hypertension, hyperlipidemia, asthma, tobacco use who presents as a hospital follow-up.  He was [facility] to [facility] on [facility]2019 with acute combined systolic and diastolic heart failure.  He reported a history of CAD, had 2 stents placed in 2007 in [place].  He had not followed with a cardiologist since that time.  He presented with volume overload, and was treated with IV Lasix.  TTE on 07/06/2019 showed LVEF 35 to 40%, normal RV function, possible bicuspid aortic valve (no AI or AS).  He underwent LHC/RHC on 4/20, which showed severe diffuse multivessel CAD, medical management recommended.  Right heart pressures were normal.  In addition he was diagnosed with type 2 diabetes, A1c 12.2%.  Also found to have LDL 211.  He was lost to follow-up from April through December 2021 as return to [place] as both of his parents were ill.  He was off all his medications for several months.  He was [facility] from 1/4 through 03/26/2020 after presenting with possible syncope and fall leading to humerus fracture.  He was walking down the stairs at his friend's restaurant and had an episode of lightheadedness and possible sudden loss of consciousness causing him to fall down the stairs and suffer right humeral neck fracture.  Orthostatics were negative.  Echocardiogram on 03/25/2020 showed EF 45 to 50%, grade 1 diastolic dysfunction, normal RV function, no significant valvular disease.  He declined surgery and follow-up with orthopedics was arranged.   Cardiac monitor on 05/22/20 showed NSVT x 6 seconds.   Since last clinic visit, he is feeling OK. He says he has lightheadedness when bending over and have occasional headaches. When travelling through the airport to get to his terminal, he experienced his heart racing, shortness of breath and muscles tensing up. Denies chest pains, LE edema or syncope.   Wt Readings from Last 3 Encounters:  10/08/20 200 lb (90.7 kg)  08/12/20 206 lb (93.4 kg)  06/09/20 204 lb 3.2 oz (92.6 kg)     Past Medical History:  Diagnosis Date   AMI (acute myocardial infarction) (HCC)    Anxiety    Asthma    Coronary artery disease    Fall at home, initial encounter 03/24/2020   High cholesterol    Hypertension     Past Surgical History:  Procedure Laterality Date   CARDIAC CATHETERIZATION     RIGHT/LEFT HEART CATH AND CORONARY ANGIOGRAPHY N/A 07/09/2019   Procedure: RIGHT/LEFT HEART CATH AND CORONARY ANGIOGRAPHY;  Surgeon: Lennette BihariKelly, Thomas A, MD;  Location: [facility] INVASIVE CV LAB;  Service: Cardiovascular;  Laterality: N/A;    Current Medications: Current Meds  Medication Sig   aspirin EC 81 MG tablet Take 81 mg by mouth daily. Swallow whole.   atorvastatin (LIPITOR) 80 MG tablet TAKE 1 TABLET (80 MG TOTAL) BY MOUTH DAILY.   clopidogrel (PLAVIX) 75 MG tablet Take 1 tablet (75 mg total) by mouth daily.   Continuous Blood Gluc Sensor (DEXCOM G6 SENSOR) MISC 1 Device  by Does not apply route as directed.   Continuous Blood Gluc Transmit (DEXCOM G6 TRANSMITTER) MISC 1 Device by Does not apply route as directed.   furosemide (LASIX) 20 MG tablet TAKE 1 TABLET (20 MG TOTAL) BY MOUTH DAILY.   insulin aspart (NOVOLOG) 100 UNIT/ML FlexPen Inject 10 Units into the skin 3 (three) times daily with meals.   Insulin Glargine (BASAGLAR KWIKPEN) 100 UNIT/ML Inject 25 Units into the skin 2 (two) times daily.   Insulin Pen Needle 32G X 4 MM MISC USE AS DIRECTED IN THE MORNING, AT NOON, IN THE EVENING, AND AT BEDTIME.    losartan (COZAAR) 25 MG tablet Take 1 tablet (25 mg total) by mouth daily.   metFORMIN (GLUCOPHAGE) 1000 MG tablet Take 1 tablet (1,000 mg total) by mouth daily.   metoprolol succinate (TOPROL XL) 100 MG 24 hr tablet Take 1 tablet (100 mg total) by mouth daily. Take with or immediately following a meal.   nitroGLYCERIN (NITROSTAT) 0.4 MG SL tablet PLACE 1 TABLET (0.4 MG TOTAL) UNDER THE TONGUE EVERY 5 (FIVE) MINUTES X 3 DOSES AS NEEDED FOR CHEST PAIN.   sertraline (ZOLOFT) 50 MG tablet TAKE 1 TABLET (50 MG TOTAL) BY MOUTH DAILY.   sildenafil (VIAGRA) 25 MG tablet Take 1 tablet (25 mg total) by mouth daily as needed for erectile dysfunction.   spironolactone (ALDACTONE) 25 MG tablet Take 0.5 tablets (12.5 mg total) by mouth daily.   [DISCONTINUED] furosemide (LASIX) 20 MG tablet Take 1 tablet (20 mg total) by mouth daily.     Allergies:   Penicillins   Social History   Socioeconomic History   Marital status: Single    Spouse name: Not on file   Number of children: Not on file   Years of education: Not on file   Highest education level: Not on file  Occupational History   Not on file  Tobacco Use   Smoking status: Every Day    Packs/day: 1.00    Years: 0.50    Pack years: 0.50    Types: Cigarettes   Smokeless tobacco: Never  Vaping Use   Vaping Use: Never used  Substance and Sexual Activity   Alcohol use: Never   Drug use: Never   Sexual activity: Not on file  Other Topics Concern   Not on file  Social History Narrative   Not on file   Social Determinants of Health   Financial Resource Strain: High Risk   Difficulty of Paying Living Expenses: Hard  Food Insecurity: No Food Insecurity   Worried About Running Out of Food in the Last Year: Never true   Ran Out of Food in the Last Year: Never true  Transportation Needs: No Transportation Needs   Lack of Transportation (Medical): No   Lack of Transportation (Non-Medical): No  Physical Activity: Not on file  Stress: Not on  file  Social Connections: Not on file     Family History: The patient's family history is negative for Other.  ROS:   Please see the history of present illness.   (+) shortness of breath  (+) lightheadedness (+) headaches   All other systems reviewed and are negative.  EKGs/Labs/Other Studies Reviewed:    The following studies were reviewed today:   EKG:  10/08/20: sinus rhytm rate 81 >33mm ST depressions in leads I, II, aVL, V5/6 03/22: sinus rhythm, rate 96, less than 1 mm ST depressions in leads I, II, aVL, V5/6  TTE 07/06/19:  1. Left ventricular ejection  fraction, by estimation, is 35 to 40%. The  left ventricle has moderately decreased function. The left ventricle  demonstrates regional wall motion abnormalities (see scoring  diagram/findings for description). Left ventricular   diastolic parameters are indeterminate.   2. Right ventricular systolic function is normal. The right ventricular  size is normal.   3. Left atrial size was moderately dilated.   4. The mitral valve is normal in structure. Mild mitral valve  regurgitation. No evidence of mitral stenosis.   5. Indeterminate cusp structure. Given age and amount of calcification,  high suspicion for bicuspid valve, either functional or congenital.. The  aortic valve has an indeterminant number of cusps. Aortic valve  regurgitation is not visualized. Mild to  moderate aortic valve sclerosis/calcification is present, without any  evidence of aortic stenosis.   6. The inferior vena cava is normal in size with greater than 50%  respiratory variability, suggesting right atrial pressure of 3 mmHg.   RHC/LHC 07/09/19: Mid RCA to Dist RCA lesion is 100% stenosed. Acute Mrg lesion is 90% stenosed. Ost RCA to Prox RCA lesion is 70% stenosed. Prox RCA to Mid RCA lesion is 90% stenosed. 1st Mrg lesion is 85% stenosed. 2nd Mrg-1 lesion is 100% stenosed. 2nd Mrg-2 lesion is 100% stenosed. 3rd Mrg lesion is 90%  stenosed. Dist Cx lesion is 70% stenosed. Dist LAD lesion is 100% stenosed. 2nd Diag lesion is 95% stenosed. Mid LAD lesion is 50% stenosed. 1st Diag-2 lesion is 40% stenosed. 1st Diag-1 lesion is 80% stenosed. Mid Cx to Dist Cx lesion is 30% stenosed.   Severe diffuse multivessel CAD with moderate irregularity of the LAD with 80% proximal diagonal stenosis prior to a previously placed stent with intimal hyperplasia within the stented segment, 50% the stenosis, 95% stenosis in the second diagonal vessel and total occlusion of the apical LAD; left circumflex vessel with large OM1 vessel with 85% stenosis proximal to an aneurysmal segment, total occlusion of the OM 2 vessel with previously placed stent in this vessel and 90 and 70% bifurcation stenoses in the distal circumflex; severely diffusely diseased RCA with distal occlusion.  There is faint filling of a probable small PLA vessel via the left injection.   Normal right heart pressures.   LVEDP 16 mmHg.   RECOMMENDATION: Plan initiate medical therapy since at present he is not on any anti-ischemic medications.  Most vessels are not amenable to intervention; however if patient experiences increasing chest pain symptomatology the large OM1 vessel can potentially undergo intervention with stenting.  Aggressive lipid-lowering therapy with target LDL less than 70.  Optimal blood pressure control.  Smoking cessation is essential.  With diffuse CAD consider DAPT therapy.  Recent Labs: 02/24/2020: TSH 1.070 03/25/2020: ALT 77; B Natriuretic Peptide 67.8; Hemoglobin 13.1; Magnesium 1.8; Platelets 299 06/09/2020: BUN 12; Creatinine, Ser 0.92; Potassium 4.4; Sodium 141  Recent Lipid Panel    Component Value Date/Time   CHOL 280 (H) 02/24/2020 1221   TRIG 287 (H) 02/24/2020 1221   HDL 33 (L) 02/24/2020 1221   CHOLHDL 8.5 (H) 02/24/2020 1221   CHOLHDL 8.1 07/07/2019 0336   VLDL 29 07/07/2019 0336   LDLCALC 190 (H) 02/24/2020 1221    Physical Exam:     VS:  BP 138/86 (BP Location: Right Arm)   Pulse 81   Ht 5\' 9"  (1.753 m)   Wt 200 lb (90.7 kg)   SpO2 95%   BMI 29.53 kg/m     Wt Readings from Last 3 Encounters:  10/08/20 200 lb (90.7 kg)  08/12/20 206 lb (93.4 kg)  06/09/20 204 lb 3.2 oz (92.6 kg)     GEN: in no acute distress HEENT: Normal NECK: No JVD CARDIAC:RRR, no murmurs, rubs, gallops RESPIRATORY:  Clear to auscultation without rales, wheezing or rhonchi  ABDOMEN: Soft, non-tender, non-distended MUSCULOSKELETAL:  No edema.  SKIN: Warm and dry NEUROLOGIC:  Alert and oriented x 3 PSYCHIATRIC:  Normal affect   ASSESSMENT:    1. Chronic combined systolic and diastolic congestive heart failure (HCC)   2. Coronary artery disease involving native coronary artery of native heart with angina pectoris (HCC)   3. Syncope and collapse   4. Hyperlipidemia, unspecified hyperlipidemia type   5. Erectile dysfunction, unspecified erectile dysfunction type     PLAN:    CAD: Severe multivessel CAD not amenable to intervention on catheterization 07/09/2019.   -Continue aspirin 81 mg daily and Plavix 75 mg -Continue Toprol-XL 25 mg daily -Sublingual nitroglycerin as needed  Chronic combined systolic and diastolic heart failure: EF 35 to 40% on TTE 07/06/2019.  Echocardiogram on 03/25/2020 showed EF 45 to 50%, grade 1 diastolic dysfunction, normal RV function, no significant valvular disease.   -Continue Toprol-XL 100 mg daily -Continue losartan 25 mg daily -Continue spironolactone 12.5 mg daily -Continue lasix 20 mg daily, appears euvolemic -Check BMP, if stable will titrate losartan  Syncope: Recent episode of lightheadedness/suspected syncope resulting in fall down stairs and found to have fractured humerus. Cardiac monitor on 05/22/20 showed NSVT x 6 seconds.  He was not orthostatic on presentation.  Given his severe multivessel CAD and systolic heart failure, and considering NSVT on monitor, concern for ventricular arrhythmia  as cause of his syncope.  Does not meet indication for ICD at this time, as his EF is greater than 35% and longest episode of NSVT was 6 seconds on cardiac monitor.  Recommend loop recorder for long-term monitoring.  He states that he will think about it, but does not want to proceed with loop at this time  Hyperlipidemia: LDL 190 on 02/24/2020 but had been off of statin.  Restarted atorvastatin 80 mg daily.  Will recheck lipid panel  Type 2 diabetes: A1c 12.2 07/05/2019, improved to 7.5 on 03/25/2020.  Continue insulin, follows with endocrinology.  Would consider SGLT2 inhibitor  Tobacco use: Patient counseled on risks of tobacco use and cessation strongly encouraged.  He is going to try using nicotine replacement gum to help him quit  Lower extremity pain: Normal ABIs 03/04/2020  R humerus fracture: OK to hold plavix and proceed with surgery if recommended by orthopedic surgery, though patient reports surgery not recommended   ED: will refer to urology   RTC in 6-8 weeks   Medication Adjustments/Labs and Tests Ordered: Current medicines are reviewed at length with the patient today.  Concerns regarding medicines are outlined above.  Orders Placed This Encounter  Procedures   Comprehensive metabolic panel   CBC   Magnesium   Lipid panel   Ambulatory referral to Urology   EKG 12-Lead    No orders of the defined types were placed in this encounter.   Patient Instructions  Medication Instructions:  Your physician recommends that you continue on your current medications as directed. Please refer to the Current Medication list given to you today.  *If you need a refill on your cardiac medications before your next appointment, please call your pharmacy*   Lab Work: CMET, CBC, Mag, Lipid today  If you have labs (blood work) drawn  today and your tests are completely normal, you will receive your results only by: MyChart Message (if you have MyChart) OR A paper copy in the mail If  you have any lab test that is abnormal or we need to change your treatment, we will call you to review the results.  Follow-Up: At [facility], you and your health needs are our priority.  As part of our continuing mission to provide you with exceptional heart care, we have created designated Provider Care Teams.  These Care Teams include your primary Cardiologist (physician) and Advanced Practice Providers (APPs -  Physician Assistants and Nurse Practitioners) who all work together to provide you with the care you need, when you need it.  We recommend signing up for the patient portal called "MyChart".  Sign up information is provided on this After Visit Summary.  MyChart is used to connect with patients for Virtual Visits (Telemedicine).  Patients are able to view lab/test results, encounter notes, upcoming appointments, etc.  Non-urgent messages can be sent to your provider as well.   To learn more about what you can do with MyChart, go to [email or web address].    Your next appointment:      I,Jada Bradford,acting as a scribe for Little Ishikawa, MD.,have documented all relevant documentation on the behalf of Little Ishikawa, MD,as directed by  Little Ishikawa, MD while in the presence of Little Ishikawa, MD.  I, Little Ishikawa, MD, have reviewed all documentation for this visit. The documentation on 10/08/20 for the exam, diagnosis, procedures, and orders are all accurate and complete.   Signed, Little Ishikawa, MD  10/08/2020 10:38 AM    [facility]

## 2020-10-08 ENCOUNTER — Encounter: Payer: Self-pay | Admitting: Cardiology

## 2020-10-08 ENCOUNTER — Other Ambulatory Visit: Payer: Self-pay

## 2020-10-08 ENCOUNTER — Ambulatory Visit (INDEPENDENT_AMBULATORY_CARE_PROVIDER_SITE_OTHER): Payer: Medicaid Other | Admitting: Cardiology

## 2020-10-08 VITALS — BP 138/86 | HR 81 | Ht 69.0 in | Wt 200.0 lb

## 2020-10-08 DIAGNOSIS — E785 Hyperlipidemia, unspecified: Secondary | ICD-10-CM | POA: Diagnosis not present

## 2020-10-08 DIAGNOSIS — I25119 Atherosclerotic heart disease of native coronary artery with unspecified angina pectoris: Secondary | ICD-10-CM

## 2020-10-08 DIAGNOSIS — I5042 Chronic combined systolic (congestive) and diastolic (congestive) heart failure: Secondary | ICD-10-CM | POA: Diagnosis not present

## 2020-10-08 DIAGNOSIS — N529 Male erectile dysfunction, unspecified: Secondary | ICD-10-CM

## 2020-10-08 DIAGNOSIS — R55 Syncope and collapse: Secondary | ICD-10-CM

## 2020-10-08 NOTE — Patient Instructions (Addendum)
Medication Instructions:  Your physician recommends that you continue on your current medications as directed. Please refer to the Current Medication list given to you today.  *If you need a refill on your cardiac medications before your next appointment, please call your pharmacy*   Lab Work: CMET, CBC, Mag, Lipid today  If you have labs (blood work) drawn today and your tests are completely normal, you will receive your results only by: MyChart Message (if you have MyChart) OR A paper copy in the mail If you have any lab test that is abnormal or we need to change your treatment, we will call you to review the results.  Follow-Up: At [facility], you and your health needs are our priority.  As part of our continuing mission to provide you with exceptional heart care, we have created designated Provider Care Teams.  These Care Teams include your primary Cardiologist (physician) and Advanced Practice Providers (APPs -  Physician Assistants and Nurse Practitioners) who all work together to provide you with the care you need, when you need it.  We recommend signing up for the patient portal called "MyChart".  Sign up information is provided on this After Visit Summary.  MyChart is used to connect with patients for Virtual Visits (Telemedicine).  Patients are able to view lab/test results, encounter notes, upcoming appointments, etc.  Non-urgent messages can be sent to your provider as well.   To learn more about what you can do with MyChart, go to [email or web address].    Your next appointment:   Wednesday, 8/24 at 2:20 pm with Dr. Lacy Duverney have been referred to [facility]-they will call to schedule an appointment

## 2020-10-09 LAB — COMPREHENSIVE METABOLIC PANEL
ALT: 31 IU/L (ref 0–44)
AST: 18 IU/L (ref 0–40)
Albumin/Globulin Ratio: 1.8 (ref 1.2–2.2)
Albumin: 5 g/dL — ABNORMAL HIGH (ref 3.8–4.9)
Alkaline Phosphatase: 59 IU/L (ref 44–121)
BUN/Creatinine Ratio: 15 (ref 9–20)
BUN: 14 mg/dL (ref 6–24)
Bilirubin Total: 0.3 mg/dL (ref 0.0–1.2)
CO2: 23 mmol/L (ref 20–29)
Calcium: 10.2 mg/dL (ref 8.7–10.2)
Chloride: 101 mmol/L (ref 96–106)
Creatinine, Ser: 0.96 mg/dL (ref 0.76–1.27)
Globulin, Total: 2.8 g/dL (ref 1.5–4.5)
Glucose: 169 mg/dL — ABNORMAL HIGH (ref 65–99)
Potassium: 4.1 mmol/L (ref 3.5–5.2)
Sodium: 140 mmol/L (ref 134–144)
Total Protein: 7.8 g/dL (ref 6.0–8.5)
eGFR: 93 mL/min/{1.73_m2} (ref >59)

## 2020-10-09 LAB — LIPID PANEL
Chol/HDL Ratio: 5.6 ratio — ABNORMAL HIGH (ref 0.0–5.0)
Cholesterol, Total: 192 mg/dL (ref 100–199)
HDL: 34 mg/dL — ABNORMAL LOW (ref >39)
LDL Chol Calc (NIH): 124 mg/dL — ABNORMAL HIGH (ref 0–99)
Triglycerides: 191 mg/dL — ABNORMAL HIGH (ref 0–149)
VLDL Cholesterol Cal: 34 mg/dL (ref 5–40)

## 2020-10-09 LAB — CBC
Hematocrit: 46.9 % (ref 37.5–51.0)
Hemoglobin: 15.3 g/dL (ref 13.0–17.7)
MCH: 24.8 pg — ABNORMAL LOW (ref 26.6–33.0)
MCHC: 32.6 g/dL (ref 31.5–35.7)
MCV: 76 fL — ABNORMAL LOW (ref 79–97)
Platelets: 294 10*3/uL (ref 150–450)
RBC: 6.18 x10E6/uL — ABNORMAL HIGH (ref 4.14–5.80)
RDW: 14.4 % (ref 11.6–15.4)
WBC: 9.3 10*3/uL (ref 3.4–10.8)

## 2020-10-09 LAB — MAGNESIUM: Magnesium: 2 mg/dL (ref 1.6–2.3)

## 2020-10-20 ENCOUNTER — Encounter: Payer: Self-pay | Admitting: *Deleted

## 2020-11-11 ENCOUNTER — Encounter: Payer: Self-pay | Admitting: Cardiology

## 2020-11-11 ENCOUNTER — Other Ambulatory Visit: Payer: Self-pay

## 2020-11-11 ENCOUNTER — Ambulatory Visit (INDEPENDENT_AMBULATORY_CARE_PROVIDER_SITE_OTHER): Payer: Medicaid Other | Admitting: Cardiology

## 2020-11-11 VITALS — BP 132/68 | HR 92 | Ht 69.0 in | Wt 201.0 lb

## 2020-11-11 DIAGNOSIS — I25119 Atherosclerotic heart disease of native coronary artery with unspecified angina pectoris: Secondary | ICD-10-CM

## 2020-11-11 DIAGNOSIS — R55 Syncope and collapse: Secondary | ICD-10-CM

## 2020-11-11 DIAGNOSIS — I5042 Chronic combined systolic (congestive) and diastolic (congestive) heart failure: Secondary | ICD-10-CM

## 2020-11-11 DIAGNOSIS — K219 Gastro-esophageal reflux disease without esophagitis: Secondary | ICD-10-CM

## 2020-11-11 DIAGNOSIS — E785 Hyperlipidemia, unspecified: Secondary | ICD-10-CM | POA: Diagnosis not present

## 2020-11-11 MED ORDER — LOSARTAN POTASSIUM 25 MG PO TABS
25.0000 mg | ORAL_TABLET | Freq: Every day | ORAL | 3 refills | Status: DC
  Filled 2020-11-11: qty 90, 90d supply, fill #0

## 2020-11-11 MED ORDER — FUROSEMIDE 20 MG PO TABS
ORAL_TABLET | Freq: Every day | ORAL | 3 refills | Status: DC
  Filled 2020-11-11: qty 90, 90d supply, fill #0

## 2020-11-11 MED ORDER — SPIRONOLACTONE 25 MG PO TABS
12.5000 mg | ORAL_TABLET | Freq: Every day | ORAL | 3 refills | Status: DC
  Filled 2020-11-11: qty 45, 90d supply, fill #0

## 2020-11-11 MED ORDER — ATORVASTATIN CALCIUM 80 MG PO TABS
ORAL_TABLET | Freq: Every day | ORAL | 3 refills | Status: DC
  Filled 2020-11-11: qty 90, 90d supply, fill #0

## 2020-11-11 MED ORDER — CLOPIDOGREL BISULFATE 75 MG PO TABS
75.0000 mg | ORAL_TABLET | Freq: Every day | ORAL | 3 refills | Status: DC
  Filled 2020-11-11: qty 90, 90d supply, fill #0

## 2020-11-11 MED ORDER — INSULIN PEN NEEDLE 32G X 4 MM MISC
0 refills | Status: DC
Start: 2020-11-11 — End: 2021-07-23
  Filled 2020-11-11: qty 100, 25d supply, fill #0

## 2020-11-11 MED ORDER — EZETIMIBE 10 MG PO TABS
10.0000 mg | ORAL_TABLET | Freq: Every day | ORAL | 3 refills | Status: DC
  Filled 2020-11-11: qty 90, 90d supply, fill #0

## 2020-11-11 MED ORDER — FAMOTIDINE 10 MG PO TABS
10.0000 mg | ORAL_TABLET | Freq: Two times a day (BID) | ORAL | 3 refills | Status: DC
  Filled 2020-11-11: qty 180, 90d supply, fill #0

## 2020-11-11 MED ORDER — ISOSORBIDE MONONITRATE ER 30 MG PO TB24
30.0000 mg | ORAL_TABLET | Freq: Every day | ORAL | 3 refills | Status: DC
  Filled 2020-11-11: qty 90, 90d supply, fill #0

## 2020-11-11 MED ORDER — METOPROLOL SUCCINATE ER 100 MG PO TB24
100.0000 mg | ORAL_TABLET | Freq: Every day | ORAL | 3 refills | Status: DC
  Filled 2020-11-11: qty 90, 90d supply, fill #0

## 2020-11-11 NOTE — Progress Notes (Signed)
Cardiology Office Note:    Date:  11/11/2020   ID:  Larry Dammearek Latona, DOB 05/18/1965, MRN [id number]  PCP:  Grayce SessionsEdwards, Michelle P, NP  Cardiologist:  Little Ishikawahristopher L Vita Currin, MD  Electrophysiologist:  None   Referring MD: Grayce SessionsEdwards, Michelle P, NP   Chief Complaint  Patient presents with   Follow-up    6-8 weeks.   Congestive Heart Failure      History of Present Illness:    Larry Lewis is a 55 y.o. male with a hx of severe multivessel CAD, combined systolic and diastolic heart failure, diabetes, hypertension, hyperlipidemia, asthma, tobacco use who presents as a hospital follow-up.  He was [facility]2019 with acute combined systolic and diastolic heart failure.  He reported a history of CAD, had 2 stents placed in [place] in [place].  He had not followed with a cardiologist since that time.  He presented with volume overload, and was treated with IV Lasix.  TTE on 07/06/2019 showed LVEF 35 to 40%, normal RV function, possible bicuspid aortic valve (no AI or AS).  He underwent LHC/RHC on 4/20, which showed severe diffuse multivessel CAD, medical management recommended.  Right heart pressures were normal.  In addition he was diagnosed with type 2 diabetes, A1c 12.2%.  Also found to have LDL 211.  He was lost to follow-up from April through December 2021 as return to [place] as both of his parents were ill.  He was off all his medications for several months.  He was [facility] from 1/4 through 03/26/2020 after presenting with possible syncope and fall leading to humerus fracture.  He was walking down the stairs at his friend's restaurant and had an episode of lightheadedness and possible sudden loss of consciousness causing him to fall down the stairs and suffer right humeral neck fracture.  Orthostatics were negative.  Echocardiogram on 03/25/2020 showed EF 45 to 50%, grade 1 diastolic dysfunction, normal RV function, no significant valvular disease.  He declined surgery and follow-up  with orthopedics was arranged.  Cardiac monitor on 05/22/20 showed NSVT x 6 seconds.   Since last clinic visit, he reports that he has been doing okay.  He has been having occasional chest pain.  Occurs with exertion but can last for 20 to 30 minutes.  He has not been using his nitroglycerin.  Also states that chest pain can occur when stressed.  Reports intermittent lightheadedness but denies any syncopal episodes.  Denies any lower extremity edema.  Reports he has intermittent palpitations.  Also ports he has been having a burning pain in his chest.  States that he is going to [place] next month for 3 to 4 months.   Wt Readings from Last 3 Encounters:  11/11/20 201 lb (91.2 kg)  10/08/20 200 lb (90.7 kg)  08/12/20 206 lb (93.4 kg)     Past Medical History:  Diagnosis Date   AMI (acute myocardial infarction) (HCC)    Anxiety    Asthma    Coronary artery disease    Fall at home, initial encounter 03/24/2020   High cholesterol    Hypertension     Past Surgical History:  Procedure Laterality Date   CARDIAC CATHETERIZATION     RIGHT/LEFT HEART CATH AND CORONARY ANGIOGRAPHY N/A 07/09/2019   Procedure: RIGHT/LEFT HEART CATH AND CORONARY ANGIOGRAPHY;  Surgeon: Lennette BihariKelly, Thomas A, MD;  Location: [facility] INVASIVE [facility];  Service: Cardiovascular;  Laterality: N/A;    Current Medications: Current Meds  Medication Sig  aspirin EC 81 MG tablet Take 81 mg by mouth daily. Swallow whole.   Continuous Blood Gluc Sensor (DEXCOM G6 SENSOR) MISC 1 Device by Does not apply route as directed.   Continuous Blood Gluc Transmit (DEXCOM G6 TRANSMITTER) MISC 1 Device by Does not apply route as directed.   Dulaglutide 0.75 MG/0.5ML SOPN INJECT 0.75 MG INTO THE SKIN ONCE A WEEK.   ezetimibe (ZETIA) 10 MG tablet Take 1 tablet (10 mg total) by mouth daily.   famotidine (PEPCID) 10 MG tablet Take 1 tablet (10 mg total) by mouth 2 (two) times daily.   guaiFENesin (MUCINEX) 600 MG 12 hr tablet TAKE 1 TABLET (600 MG  TOTAL) BY MOUTH 2 (TWO) TIMES DAILY AS NEEDED FOR TO LOOSEN PHLEGM.   hydrOXYzine (ATARAX/VISTARIL) 50 MG tablet TAKE 1/2- 1 TABLET BY MOUTH AT BEDTIME AS NEEDED FOR INSOMNIA   insulin aspart (NOVOLOG) 100 UNIT/ML FlexPen Inject 10 Units into the skin 3 (three) times daily with meals.   Insulin Glargine (BASAGLAR KWIKPEN) 100 UNIT/ML Inject 25 Units into the skin 2 (two) times daily.   isosorbide mononitrate (IMDUR) 30 MG 24 hr tablet Take 1 tablet (30 mg total) by mouth daily.   metFORMIN (GLUCOPHAGE) 1000 MG tablet Take 1 tablet (1,000 mg total) by mouth daily.   nitroGLYCERIN (NITROSTAT) 0.4 MG SL tablet PLACE 1 TABLET (0.4 MG TOTAL) UNDER THE TONGUE EVERY 5 (FIVE) MINUTES X 3 DOSES AS NEEDED FOR CHEST PAIN.   polyethylene glycol (MIRALAX / GLYCOLAX) 17 g packet TAKE 17 G BY MOUTH 2 (TWO) TIMES DAILY AS NEEDED.   polyethylene glycol powder (GLYCOLAX/MIRALAX) 17 GM/SCOOP powder Take 17 g by mouth 2 (two) times daily as needed.   sertraline (ZOLOFT) 50 MG tablet TAKE 1 TABLET (50 MG TOTAL) BY MOUTH DAILY.   sildenafil (VIAGRA) 25 MG tablet Take 1 tablet (25 mg total) by mouth daily as needed for erectile dysfunction.   [DISCONTINUED] atorvastatin (LIPITOR) 80 MG tablet TAKE 1 TABLET (80 MG TOTAL) BY MOUTH DAILY.   [DISCONTINUED] clopidogrel (PLAVIX) 75 MG tablet Take 1 tablet (75 mg total) by mouth daily.   [DISCONTINUED] furosemide (LASIX) 20 MG tablet TAKE 1 TABLET (20 MG TOTAL) BY MOUTH DAILY.   [DISCONTINUED] Insulin Pen Needle 32G X 4 MM MISC USE AS DIRECTED IN THE MORNING, AT NOON, IN THE EVENING, AND AT BEDTIME.   [DISCONTINUED] losartan (COZAAR) 25 MG tablet Take 1 tablet (25 mg total) by mouth daily.   [DISCONTINUED] metoprolol succinate (TOPROL XL) 100 MG 24 hr tablet Take 1 tablet (100 mg total) by mouth daily. Take with or immediately following a meal.   [DISCONTINUED] spironolactone (ALDACTONE) 25 MG tablet Take 0.5 tablets (12.5 mg total) by mouth daily.     Allergies:    Penicillins   Social History   Socioeconomic History   Marital status: Single    Spouse name: Not on file   Number of children: Not on file   Years of education: Not on file   Highest education level: Not on file  Occupational History   Not on file  Tobacco Use   Smoking status: Every Day    Packs/day: 1.00    Years: 0.50    Pack years: 0.50    Types: Cigarettes   Smokeless tobacco: Never  Vaping Use   Vaping Use: Never used  Substance and Sexual Activity   Alcohol use: Never   Drug use: Never   Sexual activity: Not on file  Other Topics Concern  Not on file  Social History Narrative   Not on file   Social Determinants of Health   Financial Resource Strain: High Risk   Difficulty of Paying Living Expenses: Hard  Food Insecurity: No Food Insecurity   Worried About Programme researcher, broadcasting/film/video in the Last Year: Never true   Ran Out of Food in the Last Year: Never true  Transportation Needs: No Transportation Needs   Lack of Transportation (Medical): No   Lack of Transportation (Non-Medical): No  Physical Activity: Not on file  Stress: Not on file  Social Connections: Not on file     Family History: The patient's family history is negative for Other.  ROS:   Please see the history of present illness.    EKGs/Labs/Other Studies Reviewed:    The following studies were reviewed today:   EKG:  10/08/20: sinus rhytm rate 81 >51mm ST depressions in leads I, II, aVL, V5/6 03/22: sinus rhythm, rate 96, less than 1 mm ST depressions in leads I, II, aVL, V5/6  TTE 07/06/19:  1. Left ventricular ejection fraction, by estimation, is 35 to 40%. The  left ventricle has moderately decreased function. The left ventricle  demonstrates regional wall motion abnormalities (see scoring  diagram/findings for description). Left ventricular   diastolic parameters are indeterminate.   2. Right ventricular systolic function is normal. The right ventricular  size is normal.   3. Left  atrial size was moderately dilated.   4. The mitral valve is normal in structure. Mild mitral valve  regurgitation. No evidence of mitral stenosis.   5. Indeterminate cusp structure. Given age and amount of calcification,  high suspicion for bicuspid valve, either functional or congenital.. The  aortic valve has an indeterminant number of cusps. Aortic valve  regurgitation is not visualized. Mild to  moderate aortic valve sclerosis/calcification is present, without any  evidence of aortic stenosis.   6. The inferior vena cava is normal in size with greater than 50%  respiratory variability, suggesting right atrial pressure of 3 mmHg.   RHC/LHC 07/09/19: Mid RCA to Dist RCA lesion is 100% stenosed. Acute Mrg lesion is 90% stenosed. Ost RCA to Prox RCA lesion is 70% stenosed. Prox RCA to Mid RCA lesion is 90% stenosed. 1st Mrg lesion is 85% stenosed. 2nd Mrg-1 lesion is 100% stenosed. 2nd Mrg-2 lesion is 100% stenosed. 3rd Mrg lesion is 90% stenosed. Dist Cx lesion is 70% stenosed. Dist LAD lesion is 100% stenosed. 2nd Diag lesion is 95% stenosed. Mid LAD lesion is 50% stenosed. 1st Diag-2 lesion is 40% stenosed. 1st Diag-1 lesion is 80% stenosed. Mid Cx to Dist Cx lesion is 30% stenosed.   Severe diffuse multivessel CAD with moderate irregularity of the LAD with 80% proximal diagonal stenosis prior to a previously placed stent with intimal hyperplasia within the stented segment, 50% the stenosis, 95% stenosis in the second diagonal vessel and total occlusion of the apical LAD; left circumflex vessel with large OM1 vessel with 85% stenosis proximal to an aneurysmal segment, total occlusion of the OM 2 vessel with previously placed stent in this vessel and 90 and 70% bifurcation stenoses in the distal circumflex; severely diffusely diseased RCA with distal occlusion.  There is faint filling of a probable small PLA vessel via the left injection.   Normal right heart pressures.   LVEDP 16  mmHg.   RECOMMENDATION: Plan initiate medical therapy since at present he is not on any anti-ischemic medications.  Most vessels are not amenable to intervention;  however if patient experiences increasing chest pain symptomatology the large OM1 vessel can potentially undergo intervention with stenting.  Aggressive lipid-lowering therapy with target LDL less than 70.  Optimal blood pressure control.  Smoking cessation is essential.  With diffuse CAD consider DAPT therapy.  Recent Labs: 02/24/2020: TSH 1.070 03/25/2020: B Natriuretic Peptide 67.8 10/08/2020: ALT 31; BUN 14; Creatinine, Ser 0.96; Hemoglobin 15.3; Magnesium 2.0; Platelets 294; Potassium 4.1; Sodium 140  Recent Lipid Panel    Component Value Date/Time   CHOL 192 10/08/2020 1053   TRIG 191 (H) 10/08/2020 1053   HDL 34 (L) 10/08/2020 1053   CHOLHDL 5.6 (H) 10/08/2020 1053   CHOLHDL 8.1 07/07/2019 0336   VLDL 29 07/07/2019 0336   LDLCALC 124 (H) 10/08/2020 1053    Physical Exam:    VS:  BP 132/68 (BP Location: Left Arm, Patient Position: Sitting, Cuff Size: Large)   Pulse 92   Ht 5\' 9"  (1.753 m)   Wt 201 lb (91.2 kg)   BMI 29.68 kg/m     Wt Readings from Last 3 Encounters:  11/11/20 201 lb (91.2 kg)  10/08/20 200 lb (90.7 kg)  08/12/20 206 lb (93.4 kg)     GEN: in no acute distress HEENT: Normal NECK: No JVD CARDIAC:RRR, 2/6 systolic murmur RESPIRATORY:  Clear to auscultation without rales, wheezing or rhonchi  ABDOMEN: Soft, non-tender, non-distended MUSCULOSKELETAL:  No edema.  SKIN: Warm and dry NEUROLOGIC:  Alert and oriented x 3 PSYCHIATRIC:  Normal affect   ASSESSMENT:    1. Chronic combined systolic and diastolic congestive heart failure (HCC)   2. Syncope and collapse   3. Coronary artery disease involving native coronary artery of native heart with angina pectoris (HCC)   4. Hyperlipidemia, unspecified hyperlipidemia type   5. Gastroesophageal reflux disease, unspecified whether esophagitis present       PLAN:    CAD: Severe multivessel CAD not amenable to intervention on catheterization 07/09/2019.   -Continue aspirin 81 mg daily and Plavix 75 mg -Continue Toprol-XL 100 mg daily -Add imdur 30 mg daily -Sublingual nitroglycerin as needed  Chronic combined systolic and diastolic heart failure: EF 35 to 40% on TTE 07/06/2019.  Echocardiogram on 03/25/2020 showed EF 45 to 50%, grade 1 diastolic dysfunction, normal RV function, no significant valvular disease.   -Continue Toprol-XL 100 mg daily -Continue losartan 25 mg daily -Continue spironolactone 12.5 mg daily -Continue lasix 20 mg daily, appears euvolemic -Check BMP/magnesium  Syncope: Recent episode of lightheadedness/suspected syncope resulting in fall down stairs and found to have fractured humerus. Cardiac monitor on 05/22/20 showed NSVT x 6 seconds.  He was not orthostatic on presentation.  Given his severe multivessel CAD and systolic heart failure, and considering NSVT on monitor, concern for ventricular arrhythmia as cause of his syncope.  Does not meet indication for ICD at this time, as his EF is greater than 35% and longest episode of NSVT was 6 seconds on cardiac monitor.  Recommend loop recorder for long-term monitoring but he declines at this time.  States he will reconsider if he has another episode  Hyperlipidemia: LDL 190 on 02/24/2020 but had been off of statin.  Restarted atorvastatin 80 mg daily.  LDL 124 on 10/08/2020.  We will add Zetia 10 mg daily.  Type 2 diabetes: A1c 12.2 07/05/2019, improved to 7.5 on 03/25/2020.  Continue insulin, follows with endocrinology.  Would recommend SGLT2 inhibitor  Tobacco use: Patient counseled on risks of tobacco use and cessation strongly encouraged.  He is going  to try using nicotine replacement gum to help him quit  Lower extremity pain: Normal ABIs 03/04/2020  R humerus fracture: OK to hold plavix and proceed with surgery if recommended by orthopedic surgery, though patient reports  surgery not recommended   GERD: Start famotidine   RTC in 5 months once returns from [place]   Medication Adjustments/Labs and Tests Ordered: Current medicines are reviewed at length with the patient today.  Concerns regarding medicines are outlined above.  Orders Placed This Encounter  Procedures   Basic metabolic panel   Magnesium     Meds ordered this encounter  Medications   Insulin Pen Needle 32G X 4 MM MISC    Sig: USE AS DIRECTED IN THE MORNING, AT NOON, IN THE EVENING, AND AT BEDTIME.    Dispense:  400 each    Refill:  0   atorvastatin (LIPITOR) 80 MG tablet    Sig: TAKE 1 TABLET (80 MG TOTAL) BY MOUTH DAILY.    Dispense:  90 tablet    Refill:  3   clopidogrel (PLAVIX) 75 MG tablet    Sig: Take 1 tablet (75 mg total) by mouth daily.    Dispense:  90 tablet    Refill:  3   furosemide (LASIX) 20 MG tablet    Sig: TAKE 1 TABLET (20 MG TOTAL) BY MOUTH DAILY.    Dispense:  90 tablet    Refill:  3   losartan (COZAAR) 25 MG tablet    Sig: Take 1 tablet (25 mg total) by mouth daily.    Dispense:  90 tablet    Refill:  3   metoprolol succinate (TOPROL XL) 100 MG 24 hr tablet    Sig: Take 1 tablet (100 mg total) by mouth daily. Take with or immediately following a meal.    Dispense:  90 tablet    Refill:  3   spironolactone (ALDACTONE) 25 MG tablet    Sig: Take 0.5 tablets (12.5 mg total) by mouth daily.    Dispense:  45 tablet    Refill:  3   famotidine (PEPCID) 10 MG tablet    Sig: Take 1 tablet (10 mg total) by mouth 2 (two) times daily.    Dispense:  180 tablet    Refill:  3   isosorbide mononitrate (IMDUR) 30 MG 24 hr tablet    Sig: Take 1 tablet (30 mg total) by mouth daily.    Dispense:  90 tablet    Refill:  3   ezetimibe (ZETIA) 10 MG tablet    Sig: Take 1 tablet (10 mg total) by mouth daily.    Dispense:  90 tablet    Refill:  3     Patient Instructions  Medication Instructions:  START famotidine 10 mg two times daily START isosorbide (Imdur) 30  mg daily START Zetia 10 mg daily  *If you need a refill on your cardiac medications before your next appointment, please call your pharmacy*   Lab Work: BMET, Mag today  If you have labs (blood work) drawn today and your tests are completely normal, you will receive your results only by: MyChart Message (if you have MyChart) OR A paper copy in the mail If you have any lab test that is abnormal or we need to change your treatment, we will call you to review the results.  Follow-Up: At [facility], you and your health needs are our priority.  As part of our continuing mission to provide you with exceptional heart care,  we have created designated Provider Care Teams.  These Care Teams include your primary Cardiologist (physician) and Advanced Practice Providers (APPs -  Physician Assistants and Nurse Practitioners) who all work together to provide you with the care you need, when you need it.  We recommend signing up for the patient portal called "MyChart".  Sign up information is provided on this After Visit Summary.  MyChart is used to connect with patients for Virtual Visits (Telemedicine).  Patients are able to view lab/test results, encounter notes, upcoming appointments, etc.  Non-urgent messages can be sent to your provider as well.   To learn more about what you can do with MyChart, go to [email or web address].    Your next appointment:   Tuesday, 1/17 at 9:20 AM with Dr. Bjorn Pippin   Other Instructions Refer to dental resources provided      Signed, Little Ishikawa, MD  11/11/2020 2:36 PM    [facility]

## 2020-11-11 NOTE — Patient Instructions (Addendum)
Medication Instructions:  START famotidine 10 mg two times daily START isosorbide (Imdur) 30 mg daily START Zetia 10 mg daily  *If you need a refill on your cardiac medications before your next appointment, please call your pharmacy*   Lab Work: BMET, Mag today  If you have labs (blood work) drawn today and your tests are completely normal, you will receive your results only by: MyChart Message (if you have MyChart) OR A paper copy in the mail If you have any lab test that is abnormal or we need to change your treatment, we will call you to review the results.  Follow-Up: At [facility], you and your health needs are our priority.  As part of our continuing mission to provide you with exceptional heart care, we have created designated Provider Care Teams.  These Care Teams include your primary Cardiologist (physician) and Advanced Practice Providers (APPs -  Physician Assistants and Nurse Practitioners) who all work together to provide you with the care you need, when you need it.  We recommend signing up for the patient portal called "MyChart".  Sign up information is provided on this After Visit Summary.  MyChart is used to connect with patients for Virtual Visits (Telemedicine).  Patients are able to view lab/test results, encounter notes, upcoming appointments, etc.  Non-urgent messages can be sent to your provider as well.   To learn more about what you can do with MyChart, go to [email or web address].    Your next appointment:   Tuesday, 1/17 at 9:20 AM with Dr. Bjorn Pippin   Other Instructions Refer to dental resources provided

## 2020-11-12 LAB — BASIC METABOLIC PANEL
BUN/Creatinine Ratio: 12 (ref 9–20)
BUN: 11 mg/dL (ref 6–24)
CO2: 22 mmol/L (ref 20–29)
Calcium: 9.8 mg/dL (ref 8.7–10.2)
Chloride: 99 mmol/L (ref 96–106)
Creatinine, Ser: 0.89 mg/dL (ref 0.76–1.27)
Glucose: 262 mg/dL — ABNORMAL HIGH (ref 65–99)
Potassium: 4.1 mmol/L (ref 3.5–5.2)
Sodium: 140 mmol/L (ref 134–144)
eGFR: 101 mL/min/{1.73_m2} (ref >59)

## 2020-11-12 LAB — MAGNESIUM: Magnesium: 2 mg/dL (ref 1.6–2.3)

## 2020-11-13 ENCOUNTER — Encounter: Payer: Self-pay | Admitting: *Deleted

## 2020-11-18 ENCOUNTER — Other Ambulatory Visit: Payer: Self-pay

## 2020-11-22 ENCOUNTER — Other Ambulatory Visit (INDEPENDENT_AMBULATORY_CARE_PROVIDER_SITE_OTHER): Payer: Self-pay | Admitting: Physician Assistant

## 2020-11-22 DIAGNOSIS — F411 Generalized anxiety disorder: Secondary | ICD-10-CM

## 2020-12-03 MED ORDER — FAMOTIDINE 10 MG PO TABS
10.0000 mg | ORAL_TABLET | Freq: Two times a day (BID) | ORAL | 3 refills | Status: DC
Start: 2020-12-03 — End: 2021-04-06

## 2020-12-03 MED ORDER — ISOSORBIDE MONONITRATE ER 30 MG PO TB24: 30.0000 mg | ORAL_TABLET | Freq: Every day | ORAL | 3 refills | Status: DC

## 2020-12-03 MED ORDER — EZETIMIBE 10 MG PO TABS: 10.0000 mg | ORAL_TABLET | Freq: Every day | ORAL | 3 refills | Status: DC

## 2020-12-08 ENCOUNTER — Other Ambulatory Visit (INDEPENDENT_AMBULATORY_CARE_PROVIDER_SITE_OTHER): Payer: Self-pay | Admitting: Physician Assistant

## 2020-12-08 DIAGNOSIS — F411 Generalized anxiety disorder: Secondary | ICD-10-CM

## 2020-12-16 ENCOUNTER — Ambulatory Visit: Payer: Medicaid Other | Admitting: Internal Medicine

## 2021-03-29 ENCOUNTER — Other Ambulatory Visit: Payer: Self-pay

## 2021-03-29 ENCOUNTER — Encounter (INDEPENDENT_AMBULATORY_CARE_PROVIDER_SITE_OTHER): Payer: Self-pay | Admitting: Primary Care

## 2021-03-29 ENCOUNTER — Ambulatory Visit (INDEPENDENT_AMBULATORY_CARE_PROVIDER_SITE_OTHER): Payer: Medicaid Other | Admitting: Primary Care

## 2021-03-29 VITALS — BP 127/78 | HR 94 | Temp 97.7°F | Ht 69.0 in | Wt 212.8 lb

## 2021-03-29 DIAGNOSIS — F5104 Psychophysiologic insomnia: Secondary | ICD-10-CM | POA: Diagnosis not present

## 2021-03-29 DIAGNOSIS — F411 Generalized anxiety disorder: Secondary | ICD-10-CM

## 2021-03-29 DIAGNOSIS — Z1211 Encounter for screening for malignant neoplasm of colon: Secondary | ICD-10-CM

## 2021-03-29 MED ORDER — BUSPIRONE HCL 5 MG PO TABS: 5.0000 mg | ORAL_TABLET | Freq: Two times a day (BID) | ORAL | 1 refills | Status: DC

## 2021-03-29 MED ORDER — TRAZODONE HCL 50 MG PO TABS: 25.0000 mg | ORAL_TABLET | Freq: Every evening | ORAL | 1 refills | Status: DC | PRN

## 2021-03-29 NOTE — Patient Instructions (Signed)
Buspirone Tablets ?What is this medication? ?BUSPIRONE (byoo SPYE rone) treats anxiety. It works by balancing the levels of dopamine and serotonin in your brain, hormones that help regulate mood. ?This medicine may be used for other purposes; ask your health care provider or pharmacist if you have questions. ?COMMON BRAND NAME(S): BuSpar, Buspar Dividose ?What should I tell my care team before I take this medication? ?They need to know if you have any of these conditions: ?Kidney or liver disease ?An unusual or allergic reaction to buspirone, other medications, foods, dyes, or preservatives ?Pregnant or trying to get pregnant ?Breast-feeding ?How should I use this medication? ?Take this medication by mouth with a glass of water. Follow the directions on the prescription label. You may take this medication with or without food. To ensure that this medication always works the same way for you, you should take it either always with or always without food. Take your doses at regular intervals. Do not take your medication more often than directed. Do not stop taking except on the advice of your care team. ?Talk to your care team about the use of this medication in children. Special care may be needed. ?Overdosage: If you think you have taken too much of this medicine contact a poison control center or emergency room at once. ?NOTE: This medicine is only for you. Do not share this medicine with others. ?What if I miss a dose? ?If you miss a dose, take it as soon as you can. If it is almost time for your next dose, take only that dose. Do not take double or extra doses. ?What may interact with this medication? ?Do not take this medication with any of the following: ?Linezolid ?MAOIs like Carbex, Eldepryl, Marplan, Nardil, and Parnate ?Methylene blue ?Procarbazine ?This medication may also interact with the following: ?Diazepam ?Digoxin ?Diltiazem ?Erythromycin ?Grapefruit juice ?Haloperidol ?Medications for mental  depression or mood problems ?Medications for seizures like carbamazepine, phenobarbital and phenytoin ?Nefazodone ?Other medications for anxiety ?Rifampin ?Ritonavir ?Some antifungal medications like itraconazole, ketoconazole, and voriconazole ?Verapamil ?Warfarin ?This list may not describe all possible interactions. Give your health care provider a list of all the medicines, herbs, non-prescription drugs, or dietary supplements you use. Also tell them if you smoke, drink alcohol, or use illegal drugs. Some items may interact with your medicine. ?What should I watch for while using this medication? ?Visit your care team for regular checks on your progress. It may take 1 to 2 weeks before your anxiety gets better. ?You may get drowsy or dizzy. Do not drive, use machinery, or do anything that needs mental alertness until you know how this medication affects you. Do not stand or sit up quickly, especially if you are an older patient. This reduces the risk of dizzy or fainting spells. Alcohol can make you more drowsy and dizzy. Avoid alcoholic drinks. ?What side effects may I notice from receiving this medication? ?Side effects that you should report to your care team as soon as possible: ?Allergic reactions--skin rash, itching, hives, swelling of the face, lips, tongue, or throat ?Irritability, confusion, fast or irregular heartbeat, muscle stiffness, twitching muscles, sweating, high fever, seizure, chills, vomiting, diarrhea, which may be signs of serotonin syndrome ?Side effects that usually do not require medical attention (report to your care team if they continue or are bothersome): ?Anxiety or nervousness ?Dizziness ?Drowsiness ?Headache ?Nausea ?Trouble sleeping ?This list may not describe all possible side effects. Call your doctor for medical advice about side effects. You may report   side effects to FDA at 1-800-FDA-1088. ?Where should I keep my medication? ?Keep out of the reach of children. ?Store at room  temperature below 30 degrees C (86 degrees F). Protect from light. Keep container tightly closed. Throw away any unused medication after the expiration date. ?NOTE: This sheet is a summary. It may not cover all possible information. If you have questions about this medicine, talk to your doctor, pharmacist, or health care provider. ?? 2022 Elsevier/Gold Standard (2020-06-04 00:00:00) ? ?

## 2021-04-01 NOTE — Progress Notes (Signed)
Larry Lewis, is a 56 y.o. male  [id number]  [id number]  DOB - Mar 30, 1965  Chief Complaint  Patient presents with   Follow-up    anxiety       Subjective:   [name] is a 56 y.o. male here today for a follow up visit on anxiety. He denies harm to self or others, auditory hallucination. Patient has No headache, No chest pain, No abdominal pain - No Nausea, No new weakness tingling or numbness, No Cough - SOB.  No problems updated.  ALLERGIES: Allergies  Allergen Reactions   Penicillins Itching    PAST MEDICAL HISTORY: Past Medical History:  Diagnosis Date   AMI (acute myocardial infarction) ([place])    Anxiety    Asthma    Coronary artery disease    Fall at home, initial encounter 03/24/2020   High cholesterol    Hypertension     MEDICATIONS AT HOME: Prior to Admission medications   Medication Sig Start Date End Date Taking? Authorizing Provider  aspirin EC 81 MG tablet Take 81 mg by mouth daily. Swallow whole.   Yes [provider]  atorvastatin (LIPITOR) 80 MG tablet TAKE 1 TABLET (80 MG TOTAL) BY MOUTH DAILY. 11/11/20 11/11/21 Yes Donato Heinz, MD  busPIRone (BUSPAR) 5 MG tablet Take 1 tablet (5 mg total) by mouth 2 (two) times daily. 03/29/21  Yes [name], NP  clopidogrel (PLAVIX) 75 MG tablet Take 1 tablet (75 mg total) by mouth daily. 11/11/20  Yes Donato Heinz, MD  Continuous Blood Gluc Sensor (DEXCOM G6 SENSOR) MISC 1 Device by Does not apply route as directed. 08/12/20  Yes Shamleffer, Melanie Crazier, MD  Continuous Blood Gluc Transmit (DEXCOM G6 TRANSMITTER) MISC 1 Device by Does not apply route as directed. 08/12/20  Yes Shamleffer, Melanie Crazier, MD  Dulaglutide 0.75 MG/0.5ML SOPN INJECT 0.75 MG INTO THE SKIN ONCE A WEEK. 05/13/20 05/13/21 Yes Shamleffer, Melanie Crazier, MD  ezetimibe (ZETIA) 10 MG tablet Take 1 tablet (10 mg total) by mouth daily. 12/03/20 11/28/21 Yes Donato Heinz, MD  famotidine  (PEPCID) 10 MG tablet Take 1 tablet (10 mg total) by mouth 2 (two) times daily. 12/03/20  Yes Donato Heinz, MD  furosemide (LASIX) 20 MG tablet TAKE 1 TABLET (20 MG TOTAL) BY MOUTH DAILY. 11/11/20 11/11/21 Yes Donato Heinz, MD  guaiFENesin (MUCINEX) 600 MG 12 hr tablet TAKE 1 TABLET (600 MG TOTAL) BY MOUTH 2 (TWO) TIMES DAILY AS NEEDED FOR TO LOOSEN PHLEGM. 04/09/20 04/09/21 Yes Mayers, Cari S, PA-C  insulin aspart (NOVOLOG) 100 UNIT/ML FlexPen Inject 10 Units into the skin 3 (three) times daily with meals. 08/12/20  Yes Shamleffer, Melanie Crazier, MD  Insulin Glargine (BASAGLAR KWIKPEN) 100 UNIT/ML Inject 25 Units into the skin 2 (two) times daily. 08/12/20  Yes Shamleffer, Melanie Crazier, MD  Insulin Pen Needle 32G X 4 MM MISC USE AS DIRECTED IN THE MORNING, AT NOON, IN THE EVENING, AND AT BEDTIME. 11/11/20 11/11/21 Yes Donato Heinz, MD  isosorbide mononitrate (IMDUR) 30 MG 24 hr tablet Take 1 tablet (30 mg total) by mouth daily. 12/03/20  Yes Donato Heinz, MD  losartan (COZAAR) 25 MG tablet Take 1 tablet (25 mg total) by mouth daily. 11/11/20 11/06/21 Yes Donato Heinz, MD  metFORMIN (GLUCOPHAGE) 1000 MG tablet Take 1 tablet (1,000 mg total) by mouth daily. 08/12/20  Yes Shamleffer, Melanie Crazier, MD  metoprolol succinate (TOPROL XL) 100 MG 24 hr tablet Take 1 tablet (100  mg total) by mouth daily. Take with or immediately following a meal. 11/11/20 11/06/21 Yes Donato Heinz, MD  polyethylene glycol (MIRALAX / GLYCOLAX) 17 g packet TAKE 17 G BY MOUTH 2 (TWO) TIMES DAILY AS NEEDED. 04/09/20 04/09/21 Yes Mayers, Cari S, PA-C  sildenafil (VIAGRA) 25 MG tablet Take 1 tablet (25 mg total) by mouth daily as needed for erectile dysfunction. 06/17/20  Yes [name], NP  spironolactone (ALDACTONE) 25 MG tablet Take 0.5 tablets (12.5 mg total) by mouth daily. 11/11/20 11/06/21 Yes Donato Heinz, MD  traZODone (DESYREL) 50 MG tablet Take 0.5-1  tablets (25-50 mg total) by mouth at bedtime as needed for sleep. 03/29/21  Yes [name], NP  nitroGLYCERIN (NITROSTAT) 0.4 MG SL tablet PLACE 1 TABLET (0.4 MG TOTAL) UNDER THE TONGUE EVERY 5 (FIVE) MINUTES X 3 DOSES AS NEEDED FOR CHEST PAIN. 02/24/20 02/23/21  Donato Heinz, MD    Objective:   Vitals:   03/29/21 1031  BP: 127/78  Pulse: 94  Temp: 97.7 F (36.5 C)  TempSrc: Temporal  SpO2: 94%  Weight: 212 lb 12.8 oz (96.5 kg)  Height: 5\' 9"  (1.753 m)   Exam General appearance : Awake, alert, not in any distress. Speech Clear. Not toxic looking HEENT: Atraumatic and Normocephalic, pupils equally reactive to light and accomodation Neck: Supple, no JVD. No cervical lymphadenopathy.  Chest: Good air entry bilaterally, no added sounds  CVS: S1 S2 regular, no murmurs.  Abdomen: Bowel sounds present, Non tender and not distended with no gaurding, rigidity or rebound. Extremities: B/L Lower Ext shows no edema, both legs are warm to touch Neurology: Awake alert, and oriented X 3, CN II-XII intact, Non focal Skin: No Rash  Data Review Lab Results  Component Value Date   HGBA1C 8.0 (A) 08/12/2020   HGBA1C 7.5 (H) 03/25/2020   HGBA1C 8.8 (H) 02/24/2020    Assessment & Plan  Larry Lewis was seen today for follow-up.  Diagnoses and all orders for this visit:  Colon cancer screening -     Ambulatory referral to Gastroenterology  Psychophysiological insomnia Take at bedtime 1/2 to 1 tablet at night for insomnia- on f/u he will have determine the most effective dose. -     traZODone (DESYREL) 50 MG tablet; Take 0.5-1 tablets (25-50 mg total) by mouth at bedtime as needed for sleep.  Generalized anxiety disorder Depression screen [facility] 2/9 03/29/2021 05/28/2020 04/09/2020 02/27/2020 08/01/2019  Decreased Interest 2 2 3 3 2   Down, Depressed, Hopeless 3 3 3 3 3   PHQ - 2 Score 5 5 6 6 5   Altered sleeping 3 3 2 3 3   Tired, decreased energy 3 2 2 3 3   Change in appetite 2 2 2 3 3    Feeling bad or failure about yourself  3 1 3 2 3   Trouble concentrating 3 1 3 2 2   Moving slowly or fidgety/restless 0 0 2 2 3   Suicidal thoughts 0 0 0 0 0  PHQ-9 Score 19 14 20 21 22   Difficult doing work/chores Very difficult Very difficult - Extremely dIfficult Extremely dIfficult   -     busPIRone (BUSPAR) 5 MG tablet; Take 1 tablet (5 mg total) by mouth 2 (two) times daily.  Patient have been counseled extensively about nutrition and exercise. Other issues discussed during this visit include: low cholesterol diet, weight control and daily exercise, foot care, annual eye examinations at Ophthalmology, importance of adherence with medications and regular follow-up. We also discussed long  term complications of uncontrolled diabetes and hypertension.   Return in about 2 months (around 05/27/2021) for medication f/u .  The patient was given clear instructions to go to ER or return to medical center if symptoms don't improve, worsen or new problems develop. The patient verbalized understanding. The patient was told to call to get lab results if they haven't heard anything in the next week.   This note has been created with Surveyor, quantity. Any transcriptional errors are unintentional.    [name],  [phone] 04/01/2021, 2:51 PM

## 2021-04-04 NOTE — Progress Notes (Signed)
Cardiology Office Note:    Date:  04/06/2021   ID:  [name], DOB 07-27-1965, MRN [id number]  PCP:  Larry Perna, NP  Cardiologist:  Donato Heinz, MD  Electrophysiologist:  None   Referring MD: Larry Perna, NP   Chief Complaint  Patient presents with   Coronary Artery Disease     History of Present Illness:    Larry Lewis is a 56 y.o. male with a hx of severe multivessel CAD, combined systolic and diastolic heart failure, diabetes, hypertension, hyperlipidemia, asthma, tobacco use who presents as a hospital follow-up.  He was admitted to [facility] on 07/05/2019 with acute combined systolic and diastolic heart failure.  He reported a history of CAD, had 2 stents placed in 2007 in [place].  He had not followed with a cardiologist since that time.  He presented with volume overload, and was treated with IV Lasix.  TTE on 07/06/2019 showed LVEF 35 to 40%, normal RV function, possible bicuspid aortic valve (no AI or AS).  He underwent LHC/RHC on 4/20, which showed severe diffuse multivessel CAD, medical management recommended.  Right heart pressures were normal.  In addition he was diagnosed with type 2 diabetes, A1c 12.2%.  Also found to have LDL 211.  He was lost to follow-up from April through December 2021 as return to [place] as both of his parents were ill.  He was off all his medications for several months.  He was admitted to [facility] from 1/4 through 03/26/2020 after presenting with possible syncope and fall leading to humerus fracture.  He was walking down the stairs at his friend's restaurant and had an episode of lightheadedness and possible sudden loss of consciousness causing him to fall down the stairs and suffer right humeral neck fracture.  Orthostatics were negative.  Echocardiogram on 03/25/2020 showed EF 45 to [id number], grade 1 diastolic dysfunction, normal RV function, no significant valvular disease.  He declined surgery and follow-up with orthopedics was arranged.   Cardiac monitor on 05/22/20 showed NSVT x 6 seconds.  Since last clinic visit, he remains reports that he has been doing okay.  He had been in [place] but returned to [place] last month.  States that while in [place] he did not have all his medications.  Had to take nitroglycerin 3-4 times while there.  Since he returned to the [place] and has been on his meds, states that he has not had to take any nitroglycerin.  Does report he gets short of breath with minimal exertion.  States that he had lower extremity edema when he returned to the [place] but has resolved since he restarted his Lasix.  He continues to smoke.  Wt Readings from Last 3 Encounters:  04/06/21 213 lb 6.4 oz (96.8 kg)  03/29/21 212 lb 12.8 oz (96.5 kg)  11/11/20 201 lb (91.2 kg)     Past Medical History:  Diagnosis Date   AMI (acute myocardial infarction) ([place])    Anxiety    Asthma    Coronary artery disease    Fall at home, initial encounter 03/24/2020   High cholesterol    Hypertension     Past Surgical History:  Procedure Laterality Date   CARDIAC CATHETERIZATION     RIGHT/LEFT HEART CATH AND CORONARY ANGIOGRAPHY N/A 07/09/2019   Procedure: RIGHT/LEFT HEART CATH AND CORONARY ANGIOGRAPHY;  Surgeon: Larry Sine, MD;  Location: [facility];  Service: Cardiovascular;  Laterality: N/A;    Current Medications: Current Meds  Medication  Sig   aspirin EC 81 MG tablet Take 81 mg by mouth daily. Swallow whole.   atorvastatin (LIPITOR) 80 MG tablet TAKE 1 TABLET (80 MG TOTAL) BY MOUTH DAILY.   busPIRone (BUSPAR) 5 MG tablet Take 1 tablet (5 mg total) by mouth 2 (two) times daily.   clopidogrel (PLAVIX) 75 MG tablet Take 1 tablet (75 mg total) by mouth daily.   Continuous Blood Gluc Sensor (DEXCOM G6 SENSOR) MISC 1 Device by Does not apply route as directed.   Continuous Blood Gluc Transmit (DEXCOM G6 TRANSMITTER) MISC 1 Device by Does not apply route as directed.   Dulaglutide 0.75 MG/0.5ML SOPN INJECT 0.75 MG INTO THE  SKIN ONCE A WEEK.   ezetimibe (ZETIA) 10 MG tablet Take 1 tablet (10 mg total) by mouth daily.   furosemide (LASIX) 20 MG tablet TAKE 1 TABLET (20 MG TOTAL) BY MOUTH DAILY.   guaiFENesin (MUCINEX) 600 MG 12 hr tablet TAKE 1 TABLET (600 MG TOTAL) BY MOUTH 2 (TWO) TIMES DAILY AS NEEDED FOR TO LOOSEN PHLEGM.   insulin aspart (NOVOLOG) 100 UNIT/ML FlexPen Inject 10 Units into the skin 3 (three) times daily with meals.   Insulin Glargine (BASAGLAR KWIKPEN) 100 UNIT/ML Inject 25 Units into the skin 2 (two) times daily.   Insulin Pen Needle 32G X 4 MM MISC USE AS DIRECTED IN THE MORNING, AT NOON, IN THE EVENING, AND AT BEDTIME.   isosorbide mononitrate (IMDUR) 30 MG 24 hr tablet Take 1 tablet (30 mg total) by mouth daily.   losartan (COZAAR) 25 MG tablet Take 1 tablet (25 mg total) by mouth daily.   metFORMIN (GLUCOPHAGE) 1000 MG tablet Take 1 tablet (1,000 mg total) by mouth daily.   metoprolol succinate (TOPROL XL) 100 MG 24 hr tablet Take 1 tablet (100 mg total) by mouth daily. Take with or immediately following a meal.   polyethylene glycol (MIRALAX / GLYCOLAX) 17 g packet TAKE 17 G BY MOUTH 2 (TWO) TIMES DAILY AS NEEDED.   spironolactone (ALDACTONE) 25 MG tablet Take 0.5 tablets (12.5 mg total) by mouth daily.   traZODone (DESYREL) 50 MG tablet Take 0.5-1 tablets (25-50 mg total) by mouth at bedtime as needed for sleep.   [DISCONTINUED] famotidine (PEPCID) 10 MG tablet Take 1 tablet (10 mg total) by mouth 2 (two) times daily.   [DISCONTINUED] sildenafil (VIAGRA) 25 MG tablet Take 1 tablet (25 mg total) by mouth daily as needed for erectile dysfunction.     Allergies:   Penicillins   Social History   Socioeconomic History   Marital status: Single    Spouse name: Not on file   Number of children: Not on file   Years of education: Not on file   Highest education level: Not on file  Occupational History   Not on file  Tobacco Use   Smoking status: Every Day    Packs/day: 1.00    Years:  0.50    Pack years: 0.50    Types: Cigarettes   Smokeless tobacco: Never  Vaping Use   Vaping Use: Never used  Substance and Sexual Activity   Alcohol use: Never   Drug use: Never   Sexual activity: Not on file  Other Topics Concern   Not on file  Social History Narrative   Not on file   Social Determinants of Health   Financial Resource Strain: Not on file  Food Insecurity: Not on file  Transportation Needs: Not on file  Physical Activity: Not on file  Stress:  Not on file  Social Connections: Not on file     Family History: The patient's family history is negative for Other.  ROS:   Please see the history of present illness.    EKGs/Labs/Other Studies Reviewed:    The following studies were reviewed today:   EKG:  04/06/21: Sinus rhythm, rate 96, less than 1 mm ST depressions in leads II, aVF, V5/6 10/08/20: sinus rhytm rate 81 >47mm ST depressions in leads I, II, aVL, V5/6 03/22: sinus rhythm, rate 96, less than 1 mm ST depressions in leads I, II, aVL, V5/6  TTE 07/06/19:  1. Left ventricular ejection fraction, by estimation, is 35 to 40%. The  left ventricle has moderately decreased function. The left ventricle  demonstrates regional wall motion abnormalities (see scoring  diagram/findings for description). Left ventricular   diastolic parameters are indeterminate.   2. Right ventricular systolic function is normal. The right ventricular  size is normal.   3. Left atrial size was moderately dilated.   4. The mitral valve is normal in structure. Mild mitral valve  regurgitation. No evidence of mitral stenosis.   5. Indeterminate cusp structure. Given age and amount of calcification,  high suspicion for bicuspid valve, either functional or congenital.. The  aortic valve has an indeterminant number of cusps. Aortic valve  regurgitation is not visualized. Mild to  moderate aortic valve sclerosis/calcification is present, without any  evidence of aortic stenosis.    6. The inferior vena cava is normal in size with greater than 50%  respiratory variability, suggesting right atrial pressure of 3 mmHg.   RHC/LHC 07/09/19: Mid RCA to Dist RCA lesion is 100% stenosed. Acute Mrg lesion is 90% stenosed. Ost RCA to Prox RCA lesion is 70% stenosed. Prox RCA to Mid RCA lesion is 90% stenosed. 1st Mrg lesion is 85% stenosed. 2nd Mrg-1 lesion is 100% stenosed. 2nd Mrg-2 lesion is 100% stenosed. 3rd Mrg lesion is 90% stenosed. Dist Cx lesion is 70% stenosed. Dist LAD lesion is 100% stenosed. 2nd Diag lesion is 95% stenosed. Mid LAD lesion is 50% stenosed. 1st Diag-2 lesion is 40% stenosed. 1st Diag-1 lesion is 80% stenosed. Mid Cx to Dist Cx lesion is 30% stenosed.   Severe diffuse multivessel CAD with moderate irregularity of the LAD with 80% proximal diagonal stenosis prior to a previously placed stent with intimal hyperplasia within the stented segment, 50% the stenosis, 95% stenosis in the second diagonal vessel and total occlusion of the apical LAD; left circumflex vessel with large OM1 vessel with 85% stenosis proximal to an aneurysmal segment, total occlusion of the OM 2 vessel with previously placed stent in this vessel and 90 and 70% bifurcation stenoses in the distal circumflex; severely diffusely diseased RCA with distal occlusion.  There is faint filling of a probable small PLA vessel via the left injection.   Normal right heart pressures.   LVEDP 16 mmHg.   RECOMMENDATION: Plan initiate medical therapy since at present he is not on any anti-ischemic medications.  Most vessels are not amenable to intervention; however if patient experiences increasing chest pain symptomatology the large OM1 vessel can potentially undergo intervention with stenting.  Aggressive lipid-lowering therapy with target LDL less than 70.  Optimal blood pressure control.  Smoking cessation is essential.  With diffuse CAD consider DAPT therapy.  Recent Labs: 10/08/2020: ALT  31; Hemoglobin 15.3; Platelets 294 11/11/2020: BUN 11; Creatinine, Ser 0.89; Magnesium 2.0; Potassium 4.1; Sodium 140  Recent Lipid Panel    Component Value Date/Time  CHOL 192 10/08/2020 1053   TRIG 191 (H) 10/08/2020 1053   HDL 34 (L) 10/08/2020 1053   CHOLHDL 5.6 (H) 10/08/2020 1053   CHOLHDL 8.1 07/07/2019 0336   VLDL 29 07/07/2019 0336   LDLCALC 124 (H) 10/08/2020 1053    Physical Exam:    VS:  BP 130/90 (BP Location: Left Arm)    Pulse 96    Ht 5\' 9"  (1.753 m)    Wt 213 lb 6.4 oz (96.8 kg)    SpO2 95%    BMI 31.51 kg/m     Wt Readings from Last 3 Encounters:  04/06/21 213 lb 6.4 oz (96.8 kg)  03/29/21 212 lb 12.8 oz (96.5 kg)  11/11/20 201 lb (91.2 kg)     GEN: in no acute distress HEENT: Normal NECK: No JVD CARDIAC:RRR, 2/6 systolic murmur RESPIRATORY:  Clear to auscultation without rales, wheezing or rhonchi  ABDOMEN: Soft, non-tender, non-distended MUSCULOSKELETAL:  No edema.  SKIN: Warm and dry NEUROLOGIC:  Alert and oriented x 3 PSYCHIATRIC:  Normal affect   ASSESSMENT:    1. Coronary artery disease involving native coronary artery of native heart with angina pectoris ([place])   2. Chronic combined systolic and diastolic congestive heart failure ([place])   3. Erectile dysfunction, unspecified erectile dysfunction type   4. Hyperlipidemia, unspecified hyperlipidemia type   5. Primary hypertension       PLAN:    CAD: Severe multivessel CAD not amenable to intervention on catheterization 07/09/2019.   -Continue aspirin 81 mg daily and Plavix 75 mg -Continue Toprol-XL 100 mg daily -Continue imdur 30 mg daily -Sublingual nitroglycerin as needed  Chronic combined systolic and diastolic heart failure: EF 35 to 40% on TTE 07/06/2019.  Echocardiogram on 03/25/2020 showed EF 45 to A999333, grade 1 diastolic dysfunction, normal RV function, no significant valvular disease.   -Continue Toprol-XL 100 mg daily -Continue losartan 25 mg daily -Continue spironolactone 12.5 mg  daily -Continue lasix 20 mg daily, appears euvolemic -Check CMP/magnesium  Syncope: Had episode of lightheadedness/suspected syncope resulting in fall down stairs and found to have fractured humerus. Cardiac monitor on 05/22/20 showed NSVT x 6 seconds.  He was not orthostatic on presentation.  Given his severe multivessel CAD and systolic heart failure, and considering NSVT on monitor, concern for ventricular arrhythmia as cause of his syncope.  Does not meet indication for ICD at this time, as his EF is greater than 35% and longest episode of NSVT was 6 seconds on cardiac monitor.  Recommend loop recorder for long-term monitoring but he declines at this time.  States he will reconsider if he has another episode  Hyperlipidemia: LDL 190 on 02/24/2020 but had been off of statin.  Restarted atorvastatin 80 mg daily.  LDL 124 on 10/08/2020.  Added Zetia 10 mg daily.  Will check lipid panel.  Type 2 diabetes: A1c 12.2 07/05/2019, improved to 7.5 on 03/25/2020.  Continue insulin, follows with endocrinology.  Would recommend SGLT2 inhibitor  Tobacco use: Patient counseled on risks of tobacco use and cessation strongly encouraged.  He is going to try using nicotine replacement gum to help him quit  Lower extremity pain: Normal ABIs 03/04/2020  R humerus fracture: OK to hold plavix and proceed with surgery if recommended by orthopedic surgery, though patient reports surgery not recommended   GERD: On famotidine  ED: Reports was started on sildenafil by his PCP.  Recommend avoiding this given he is on Imdur for his CAD.  Will refer to urology for evaluation  RTC in 3 months   Medication Adjustments/Labs and Tests Ordered: Current medicines are reviewed at length with the patient today.  Concerns regarding medicines are outlined above.  Orders Placed This Encounter  Procedures   CBC   Comprehensive metabolic panel   Magnesium   Lipid panel   TSH   Ambulatory referral to Urology   EKG 12-Lead      Meds ordered this encounter  Medications   famotidine (PEPCID) 10 MG tablet    Sig: Take 1 tablet (10 mg total) by mouth 2 (two) times daily.    Dispense:  180 tablet    Refill:  3     Patient Instructions  Medication Instructions:    Do not use Sildenafil ( Viagra)     Start taking Pepcid ( famotidine ) 10 mg twice a day   CONTINUE ALL OTHER MEDICATIONS   *If you need a refill on your cardiac medications before your next appointment, please call your pharmacy*   Lab Work:today CMP CBC MAGNESIUM LIPID TSH  If you have labs (blood work) drawn today and your tests are completely normal, you will receive your results only by: [place] (if you have MyChart) OR A paper copy in the mail If you have any lab test that is abnormal or we need to change your treatment, we will call you to review the results.   Testing/Procedures: Not needed   Follow-Up: At [facility], you and your health needs are our priority.  As part of our continuing mission to provide you with exceptional heart care, we have created designated Provider Care Teams.  These Care Teams include your primary Cardiologist (physician) and Advanced Practice Providers (APPs -  Physician Assistants and Nurse Practitioners) who all work together to provide you with the care you need, when you need it.     Your next appointment:   3 month(s)  The format for your next appointment:   In Person  Provider:   Donato Heinz, MD    Other Instructions  You have been referred to Urology for Erectile dysfunction       Signed, Donato Heinz, MD  04/06/2021 12:34 PM    [place]

## 2021-04-06 ENCOUNTER — Ambulatory Visit (INDEPENDENT_AMBULATORY_CARE_PROVIDER_SITE_OTHER): Payer: Medicaid Other | Admitting: Cardiology

## 2021-04-06 ENCOUNTER — Encounter: Payer: Self-pay | Admitting: Cardiology

## 2021-04-06 ENCOUNTER — Other Ambulatory Visit: Payer: Self-pay

## 2021-04-06 VITALS — BP 130/90 | HR 96 | Ht 69.0 in | Wt 213.4 lb

## 2021-04-06 DIAGNOSIS — I5042 Chronic combined systolic (congestive) and diastolic (congestive) heart failure: Secondary | ICD-10-CM | POA: Diagnosis not present

## 2021-04-06 DIAGNOSIS — E785 Hyperlipidemia, unspecified: Secondary | ICD-10-CM | POA: Diagnosis not present

## 2021-04-06 DIAGNOSIS — I25119 Atherosclerotic heart disease of native coronary artery with unspecified angina pectoris: Secondary | ICD-10-CM

## 2021-04-06 DIAGNOSIS — I10 Essential (primary) hypertension: Secondary | ICD-10-CM

## 2021-04-06 DIAGNOSIS — N529 Male erectile dysfunction, unspecified: Secondary | ICD-10-CM

## 2021-04-06 MED ORDER — FAMOTIDINE 10 MG PO TABS: 10.0000 mg | ORAL_TABLET | Freq: Two times a day (BID) | ORAL | 3 refills | Status: DC

## 2021-04-06 NOTE — Patient Instructions (Addendum)
Medication Instructions:    Do not use Sildenafil ( Viagra)     Start taking Pepcid ( famotidine ) 10 mg twice a day   CONTINUE ALL OTHER MEDICATIONS   *If you need a refill on your cardiac medications before your next appointment, please call your pharmacy*   Lab Work:today CMP CBC MAGNESIUM LIPID TSH  If you have labs (blood work) drawn today and your tests are completely normal, you will receive your results only by: MyChart Message (if you have MyChart) OR A paper copy in the mail If you have any lab test that is abnormal or we need to change your treatment, we will call you to review the results.   Testing/Procedures: Not needed   Follow-Up: At [facility], you and your health needs are our priority.  As part of our continuing mission to provide you with exceptional heart care, we have created designated Provider Care Teams.  These Care Teams include your primary Cardiologist (physician) and Advanced Practice Providers (APPs -  Physician Assistants and Nurse Practitioners) who all work together to provide you with the care you need, when you need it.     Your next appointment:   3 month(s)  The format for your next appointment:   In Person  Provider:   Little Ishikawa, MD    Other Instructions  You have been referred to Urology for Erectile dysfunction

## 2021-04-07 LAB — COMPREHENSIVE METABOLIC PANEL
ALT: 59 IU/L — ABNORMAL HIGH (ref 0–44)
AST: 25 IU/L (ref 0–40)
Albumin/Globulin Ratio: 1.8 (ref 1.2–2.2)
Albumin: 5 g/dL — ABNORMAL HIGH (ref 3.8–4.9)
Alkaline Phosphatase: 60 IU/L (ref 44–121)
BUN/Creatinine Ratio: 13 (ref 9–20)
BUN: 13 mg/dL (ref 6–24)
Bilirubin Total: 0.4 mg/dL (ref 0.0–1.2)
CO2: 23 mmol/L (ref 20–29)
Calcium: 10 mg/dL (ref 8.7–10.2)
Chloride: 98 mmol/L (ref 96–106)
Creatinine, Ser: 1.01 mg/dL (ref 0.76–1.27)
Globulin, Total: 2.8 g/dL (ref 1.5–4.5)
Glucose: 198 mg/dL — ABNORMAL HIGH (ref 70–99)
Potassium: 4.4 mmol/L (ref 3.5–5.2)
Sodium: 142 mmol/L (ref 134–144)
Total Protein: 7.8 g/dL (ref 6.0–8.5)
eGFR: 87 mL/min/{1.73_m2} (ref >59)

## 2021-04-07 LAB — LIPID PANEL
Chol/HDL Ratio: 4.1 ratio (ref 0.0–5.0)
Cholesterol, Total: 139 mg/dL (ref 100–199)
HDL: 34 mg/dL — ABNORMAL LOW (ref >39)
LDL Chol Calc (NIH): 65 mg/dL (ref 0–99)
Triglycerides: 247 mg/dL — ABNORMAL HIGH (ref 0–149)
VLDL Cholesterol Cal: 40 mg/dL (ref 5–40)

## 2021-04-07 LAB — CBC
Hematocrit: 49.2 % (ref 37.5–51.0)
Hemoglobin: 15.6 g/dL (ref 13.0–17.7)
MCH: 24.7 pg — ABNORMAL LOW (ref 26.6–33.0)
MCHC: 31.7 g/dL (ref 31.5–35.7)
MCV: 78 fL — ABNORMAL LOW (ref 79–97)
Platelets: 284 10*3/uL (ref 150–450)
RBC: 6.31 x10E6/uL — ABNORMAL HIGH (ref 4.14–5.80)
RDW: 13.7 % (ref 11.6–15.4)
WBC: 10.4 10*3/uL (ref 3.4–10.8)

## 2021-04-07 LAB — MAGNESIUM: Magnesium: 2.1 mg/dL (ref 1.6–2.3)

## 2021-04-07 LAB — TSH: TSH: 1.69 u[IU]/mL (ref 0.450–4.500)

## 2021-04-15 ENCOUNTER — Telehealth (INDEPENDENT_AMBULATORY_CARE_PROVIDER_SITE_OTHER): Payer: Self-pay

## 2021-04-15 NOTE — Telephone Encounter (Signed)
Copied from CRM [id number]. Topic: General - Other >> Apr 13, 2021  9:54 AM Gaetana Michaelis A wrote: Reason for CRM: Chander, with [facility] has called to share that they have received the referral for the patient however the patient is not in network and will be unable to be accepted  Please contact further if needed   Contacted patient, he was unavailable. Per DPR spoke with friend [name]. He is aware that [facility] does not accept the patients insurance so the referral will be sent to [place] to see if they will accept insurance. He will inform patient. Please place new referral. Yuan Gann Budd Palmer, CMA

## 2021-04-20 ENCOUNTER — Other Ambulatory Visit (INDEPENDENT_AMBULATORY_CARE_PROVIDER_SITE_OTHER): Payer: Self-pay | Admitting: Primary Care

## 2021-04-20 DIAGNOSIS — F411 Generalized anxiety disorder: Secondary | ICD-10-CM

## 2021-04-20 NOTE — Telephone Encounter (Signed)
Routed to PCP 

## 2021-04-22 NOTE — Telephone Encounter (Signed)
Has a schedule appt in March

## 2021-05-05 ENCOUNTER — Encounter (HOSPITAL_COMMUNITY): Payer: Self-pay | Admitting: Emergency Medicine

## 2021-05-05 ENCOUNTER — Inpatient Hospital Stay (HOSPITAL_COMMUNITY)
Admission: EM | Admit: 2021-05-05 | Discharge: 2021-05-06 | DRG: 291 | Disposition: A | Discharge status: Home / Self Care | Payer: Medicaid Other | Attending: Cardiology | Admitting: Cardiology

## 2021-05-05 ENCOUNTER — Emergency Department (HOSPITAL_COMMUNITY): Payer: Medicaid Other

## 2021-05-05 ENCOUNTER — Other Ambulatory Visit: Payer: Self-pay

## 2021-05-05 ENCOUNTER — Observation Stay (HOSPITAL_COMMUNITY): Payer: Medicaid Other

## 2021-05-05 DIAGNOSIS — Z955 Presence of coronary angioplasty implant and graft: Secondary | ICD-10-CM | POA: Diagnosis not present

## 2021-05-05 DIAGNOSIS — I4891 Unspecified atrial fibrillation: Principal | ICD-10-CM | POA: Diagnosis present

## 2021-05-05 DIAGNOSIS — F17210 Nicotine dependence, cigarettes, uncomplicated: Secondary | ICD-10-CM | POA: Diagnosis present

## 2021-05-05 DIAGNOSIS — I472 Ventricular tachycardia, unspecified: Secondary | ICD-10-CM | POA: Diagnosis not present

## 2021-05-05 DIAGNOSIS — I110 Hypertensive heart disease with heart failure: Principal | ICD-10-CM | POA: Diagnosis present

## 2021-05-05 DIAGNOSIS — Z20822 Contact with and (suspected) exposure to covid-19: Secondary | ICD-10-CM | POA: Diagnosis present

## 2021-05-05 DIAGNOSIS — Z7984 Long term (current) use of oral hypoglycemic drugs: Secondary | ICD-10-CM | POA: Diagnosis not present

## 2021-05-05 DIAGNOSIS — I2510 Atherosclerotic heart disease of native coronary artery without angina pectoris: Secondary | ICD-10-CM | POA: Diagnosis present

## 2021-05-05 DIAGNOSIS — R778 Other specified abnormalities of plasma proteins: Secondary | ICD-10-CM

## 2021-05-05 DIAGNOSIS — I248 Other forms of acute ischemic heart disease: Secondary | ICD-10-CM | POA: Diagnosis present

## 2021-05-05 DIAGNOSIS — I5043 Acute on chronic combined systolic (congestive) and diastolic (congestive) heart failure: Secondary | ICD-10-CM | POA: Diagnosis present

## 2021-05-05 DIAGNOSIS — I255 Ischemic cardiomyopathy: Secondary | ICD-10-CM | POA: Diagnosis present

## 2021-05-05 DIAGNOSIS — I252 Old myocardial infarction: Secondary | ICD-10-CM

## 2021-05-05 DIAGNOSIS — Z794 Long term (current) use of insulin: Secondary | ICD-10-CM | POA: Diagnosis not present

## 2021-05-05 DIAGNOSIS — E7800 Pure hypercholesterolemia, unspecified: Secondary | ICD-10-CM | POA: Diagnosis present

## 2021-05-05 DIAGNOSIS — Z7902 Long term (current) use of antithrombotics/antiplatelets: Secondary | ICD-10-CM | POA: Diagnosis not present

## 2021-05-05 DIAGNOSIS — I509 Heart failure, unspecified: Secondary | ICD-10-CM

## 2021-05-05 DIAGNOSIS — R9431 Abnormal electrocardiogram [ECG] [EKG]: Secondary | ICD-10-CM

## 2021-05-05 DIAGNOSIS — R0602 Shortness of breath: Secondary | ICD-10-CM | POA: Diagnosis not present

## 2021-05-05 DIAGNOSIS — Z79899 Other long term (current) drug therapy: Secondary | ICD-10-CM | POA: Diagnosis not present

## 2021-05-05 DIAGNOSIS — J45909 Unspecified asthma, uncomplicated: Secondary | ICD-10-CM | POA: Diagnosis present

## 2021-05-05 DIAGNOSIS — I480 Paroxysmal atrial fibrillation: Secondary | ICD-10-CM | POA: Diagnosis not present

## 2021-05-05 DIAGNOSIS — E119 Type 2 diabetes mellitus without complications: Secondary | ICD-10-CM | POA: Diagnosis present

## 2021-05-05 DIAGNOSIS — E1165 Type 2 diabetes mellitus with hyperglycemia: Secondary | ICD-10-CM

## 2021-05-05 HISTORY — DX: Heart failure, unspecified: I50.9

## 2021-05-05 LAB — CBC WITH DIFFERENTIAL/PLATELET
Abs Immature Granulocytes: 0.07 10*3/uL (ref 0.00–0.07)
Basophils Absolute: 0.1 10*3/uL (ref 0.0–0.1)
Basophils Relative: 1 %
Eosinophils Absolute: 0.5 10*3/uL (ref 0.0–0.5)
Eosinophils Relative: 4 %
HCT: 45.1 % (ref 39.0–52.0)
Hemoglobin: 14.3 g/dL (ref 13.0–17.0)
Immature Granulocytes: 1 %
Lymphocytes Relative: 28 %
Lymphs Abs: 3.5 10*3/uL (ref 0.7–4.0)
MCH: 24.2 pg — ABNORMAL LOW (ref 26.0–34.0)
MCHC: 31.7 g/dL (ref 30.0–36.0)
MCV: 76.4 fL — ABNORMAL LOW (ref 80.0–100.0)
Monocytes Absolute: 1 10*3/uL (ref 0.1–1.0)
Monocytes Relative: 8 %
Neutro Abs: 7.5 10*3/uL (ref 1.7–7.7)
Neutrophils Relative %: 58 %
Platelets: 318 10*3/uL (ref 150–400)
RBC: 5.9 MIL/uL — ABNORMAL HIGH (ref 4.22–5.81)
RDW: 13.4 % (ref 11.5–15.5)
WBC: 12.7 10*3/uL — ABNORMAL HIGH (ref 4.0–10.5)
nRBC: 0 % (ref 0.0–0.2)

## 2021-05-05 LAB — ECHOCARDIOGRAM COMPLETE
AR max vel: 1.77 cm2
AV Area VTI: 1.76 cm2
AV Area mean vel: 1.72 cm2
AV Mean grad: 11 mmHg
AV Peak grad: 19.7 mmHg
Ao pk vel: 2.22 m/s
Area-P 1/2: 3.06 cm2
Height: 66 in
S' Lateral: 3.47 cm
Weight: 3407.43 oz

## 2021-05-05 LAB — TROPONIN I (HIGH SENSITIVITY)
Troponin I (High Sensitivity): 91 ng/L — ABNORMAL HIGH (ref <18)
Troponin I (High Sensitivity): 919 ng/L (ref <18)

## 2021-05-05 LAB — HIV ANTIBODY (ROUTINE TESTING W REFLEX): HIV Screen 4th Generation wRfx: NONREACTIVE

## 2021-05-05 LAB — RESP PANEL BY RT-PCR (FLU A&B, COVID) ARPGX2
Influenza A by PCR: NEGATIVE
Influenza B by PCR: NEGATIVE
SARS Coronavirus 2 by RT PCR: NEGATIVE

## 2021-05-05 LAB — GLUCOSE, CAPILLARY
Glucose-Capillary: 240 mg/dL — ABNORMAL HIGH (ref 70–99)
Glucose-Capillary: 289 mg/dL — ABNORMAL HIGH (ref 70–99)
Glucose-Capillary: 233 mg/dL — ABNORMAL HIGH (ref 70–99)

## 2021-05-05 LAB — BASIC METABOLIC PANEL
Anion gap: 13 (ref 5–15)
BUN: 12 mg/dL (ref 6–20)
CO2: 23 mmol/L (ref 22–32)
Calcium: 9.5 mg/dL (ref 8.9–10.3)
Chloride: 99 mmol/L (ref 98–111)
Creatinine, Ser: 1.18 mg/dL (ref 0.61–1.24)
GFR, Estimated: >60 mL/min (ref >60)
Glucose, Bld: 353 mg/dL — ABNORMAL HIGH (ref 70–99)
Potassium: 3.6 mmol/L (ref 3.5–5.1)
Sodium: 135 mmol/L (ref 135–145)

## 2021-05-05 LAB — HEPARIN LEVEL (UNFRACTIONATED)
Heparin Unfractionated: 0.17 IU/mL — ABNORMAL LOW (ref 0.30–0.70)
Heparin Unfractionated: 0.32 IU/mL (ref 0.30–0.70)

## 2021-05-05 LAB — MAGNESIUM
Magnesium: 1.8 mg/dL (ref 1.7–2.4)
Magnesium: 1.7 mg/dL (ref 1.7–2.4)

## 2021-05-05 LAB — HEMOGLOBIN A1C
Hgb A1c MFr Bld: 9.4 % — ABNORMAL HIGH (ref 4.8–5.6)
Mean Plasma Glucose: 223.08 mg/dL

## 2021-05-05 LAB — TSH: TSH: 1.311 u[IU]/mL (ref 0.350–4.500)

## 2021-05-05 LAB — BRAIN NATRIURETIC PEPTIDE: B Natriuretic Peptide: 73.4 pg/mL (ref 0.0–100.0)

## 2021-05-05 MED ORDER — SPIRONOLACTONE 12.5 MG HALF TABLET
12.5000 mg | ORAL_TABLET | Freq: Every day | ORAL | Status: DC
  Administered 2021-05-05 – 2021-05-06 (×2): 12.5 mg via ORAL
  Filled 2021-05-05 (×3): qty 1

## 2021-05-05 MED ORDER — DILTIAZEM LOAD VIA INFUSION
20.0000 mg | Freq: Once | INTRAVENOUS | Status: AC
  Administered 2021-05-05: 20 mg via INTRAVENOUS
  Filled 2021-05-05: qty 20

## 2021-05-05 MED ORDER — DULAGLUTIDE 0.75 MG/0.5ML ~~LOC~~ SOAJ: 0.7500 mg | SUBCUTANEOUS | Status: DC

## 2021-05-05 MED ORDER — ISOSORBIDE MONONITRATE ER 30 MG PO TB24
30.0000 mg | ORAL_TABLET | Freq: Every day | ORAL | Status: DC
  Administered 2021-05-05 – 2021-05-06 (×2): 30 mg via ORAL
  Filled 2021-05-05 (×2): qty 1

## 2021-05-05 MED ORDER — SODIUM CHLORIDE 0.9% FLUSH: 3.0000 mL | INTRAVENOUS | Status: DC | PRN

## 2021-05-05 MED ORDER — ATORVASTATIN CALCIUM 80 MG PO TABS
80.0000 mg | ORAL_TABLET | Freq: Every day | ORAL | Status: DC
  Administered 2021-05-05 – 2021-05-06 (×2): 80 mg via ORAL
  Filled 2021-05-05 (×2): qty 1

## 2021-05-05 MED ORDER — EZETIMIBE 10 MG PO TABS
10.0000 mg | ORAL_TABLET | Freq: Every day | ORAL | Status: DC
  Administered 2021-05-05 – 2021-05-06 (×2): 10 mg via ORAL
  Filled 2021-05-05 (×3): qty 1

## 2021-05-05 MED ORDER — ONDANSETRON HCL 4 MG/2ML IJ SOLN: 4.0000 mg | Freq: Four times a day (QID) | INTRAMUSCULAR | Status: DC | PRN

## 2021-05-05 MED ORDER — INSULIN ASPART 100 UNIT/ML IJ SOLN
0.0000 [IU] | Freq: Every day | INTRAMUSCULAR | Status: DC
  Administered 2021-05-05: 2 [IU] via SUBCUTANEOUS

## 2021-05-05 MED ORDER — HEPARIN (PORCINE) 25000 UT/250ML-% IV SOLN
1850.0000 [IU]/h | INTRAVENOUS | Status: DC
  Administered 2021-05-05: 1450 [IU]/h via INTRAVENOUS
  Administered 2021-05-05: 1800 [IU]/h via INTRAVENOUS
  Administered 2021-05-06: 1850 [IU]/h via INTRAVENOUS
  Filled 2021-05-05 (×3): qty 250

## 2021-05-05 MED ORDER — ACETAMINOPHEN 325 MG PO TABS: 650.0000 mg | ORAL_TABLET | ORAL | Status: DC | PRN

## 2021-05-05 MED ORDER — DILTIAZEM HCL-DEXTROSE 125-5 MG/125ML-% IV SOLN (PREMIX)
5.0000 mg/h | INTRAVENOUS | Status: DC
  Administered 2021-05-05: 5 mg/h via INTRAVENOUS
  Filled 2021-05-05: qty 125

## 2021-05-05 MED ORDER — HEPARIN BOLUS VIA INFUSION
2700.0000 [IU] | Freq: Once | INTRAVENOUS | Status: AC
  Administered 2021-05-05: 2700 [IU] via INTRAVENOUS
  Filled 2021-05-05: qty 2700

## 2021-05-05 MED ORDER — INSULIN GLARGINE-YFGN 100 UNIT/ML ~~LOC~~ SOLN
15.0000 [IU] | Freq: Every day | SUBCUTANEOUS | Status: DC
  Administered 2021-05-05: 15 [IU] via SUBCUTANEOUS
  Filled 2021-05-05 (×2): qty 0.15

## 2021-05-05 MED ORDER — SODIUM CHLORIDE 0.9% FLUSH
3.0000 mL | Freq: Two times a day (BID) | INTRAVENOUS | Status: DC
  Administered 2021-05-05 (×2): 3 mL via INTRAVENOUS

## 2021-05-05 MED ORDER — INSULIN ASPART 100 UNIT/ML IJ SOLN
0.0000 [IU] | Freq: Three times a day (TID) | INTRAMUSCULAR | Status: DC
  Administered 2021-05-05: 11 [IU] via SUBCUTANEOUS
  Administered 2021-05-05 – 2021-05-06 (×3): 7 [IU] via SUBCUTANEOUS

## 2021-05-05 MED ORDER — LOSARTAN POTASSIUM 25 MG PO TABS
25.0000 mg | ORAL_TABLET | Freq: Every day | ORAL | Status: DC
  Administered 2021-05-05 – 2021-05-06 (×2): 25 mg via ORAL
  Filled 2021-05-05 (×2): qty 1

## 2021-05-05 MED ORDER — FUROSEMIDE 10 MG/ML IJ SOLN
40.0000 mg | Freq: Three times a day (TID) | INTRAMUSCULAR | Status: DC
  Administered 2021-05-05 – 2021-05-06 (×4): 40 mg via INTRAVENOUS
  Filled 2021-05-05 (×4): qty 4

## 2021-05-05 MED ORDER — INSULIN ASPART 100 UNIT/ML IJ SOLN
6.0000 [IU] | Freq: Three times a day (TID) | INTRAMUSCULAR | Status: DC
  Administered 2021-05-05 – 2021-05-06 (×4): 6 [IU] via SUBCUTANEOUS

## 2021-05-05 MED ORDER — BUSPIRONE HCL 5 MG PO TABS
5.0000 mg | ORAL_TABLET | Freq: Two times a day (BID) | ORAL | Status: DC
  Administered 2021-05-05 – 2021-05-06 (×3): 5 mg via ORAL
  Filled 2021-05-05 (×3): qty 1

## 2021-05-05 MED ORDER — SODIUM CHLORIDE 0.9 % IV SOLN: 250.0000 mL | INTRAVENOUS | Status: DC | PRN

## 2021-05-05 MED ORDER — HEPARIN BOLUS VIA INFUSION
4000.0000 [IU] | Freq: Once | INTRAVENOUS | Status: AC
  Administered 2021-05-05: 4000 [IU] via INTRAVENOUS
  Filled 2021-05-05: qty 4000

## 2021-05-05 MED ORDER — METOPROLOL SUCCINATE ER 100 MG PO TB24
100.0000 mg | ORAL_TABLET | Freq: Every day | ORAL | Status: DC
  Administered 2021-05-05 – 2021-05-06 (×2): 100 mg via ORAL
  Filled 2021-05-05 (×2): qty 1

## 2021-05-05 MED ORDER — TRAZODONE HCL 50 MG PO TABS: 25.0000 mg | ORAL_TABLET | Freq: Every evening | ORAL | Status: DC | PRN

## 2021-05-05 MED ORDER — FAMOTIDINE 20 MG PO TABS
10.0000 mg | ORAL_TABLET | Freq: Two times a day (BID) | ORAL | Status: DC
  Administered 2021-05-05 – 2021-05-06 (×3): 10 mg via ORAL
  Filled 2021-05-05 (×3): qty 1

## 2021-05-05 MED ORDER — CLOPIDOGREL BISULFATE 75 MG PO TABS
75.0000 mg | ORAL_TABLET | Freq: Every day | ORAL | Status: DC
  Administered 2021-05-05 – 2021-05-06 (×2): 75 mg via ORAL
  Filled 2021-05-05 (×2): qty 1

## 2021-05-05 NOTE — Progress Notes (Signed)
ANTICOAGULATION CONSULT NOTE - Initial Consult  Pharmacy Consult for Heparin Indication: atrial fibrillation  Allergies  Allergen Reactions   Penicillins Itching    Patient Measurements:   Heparin Dosing Weight: 90 kg  Vital Signs: Temp: 98.2 F (36.8 C) (02/15 0340) Temp Source: Temporal (02/15 0340) BP: 131/105 (02/15 0420) Pulse Rate: 106 (02/15 0425)  Labs: Recent Labs    05/05/21 0345  HGB 14.3  HCT 45.1  PLT 318    CrCl cannot be calculated (Patient's most recent lab result is older than the maximum 21 days allowed.).   Medical History: Past Medical History:  Diagnosis Date   AMI (acute myocardial infarction) ([place])    Anxiety    Asthma    Coronary artery disease    Fall at home, initial encounter 03/24/2020   High cholesterol    Hypertension     Medications:  No current facility-administered medications on file prior to encounter.   Current Outpatient Medications on File Prior to Encounter  Medication Sig Dispense Refill   aspirin EC 81 MG tablet Take 81 mg by mouth daily. Swallow whole.     atorvastatin (LIPITOR) 80 MG tablet TAKE 1 TABLET (80 MG TOTAL) BY MOUTH DAILY. 90 tablet 3   busPIRone (BUSPAR) 5 MG tablet TAKE 1 TABLET BY MOUTH TWICE A DAY 120 tablet 0   clopidogrel (PLAVIX) 75 MG tablet Take 1 tablet (75 mg total) by mouth daily. 90 tablet 3   Continuous Blood Gluc Sensor (DEXCOM G6 SENSOR) MISC 1 Device by Does not apply route as directed. 3 each 11   Continuous Blood Gluc Transmit (DEXCOM G6 TRANSMITTER) MISC 1 Device by Does not apply route as directed. 1 each 3   Dulaglutide 0.75 MG/0.5ML SOPN INJECT 0.75 MG INTO THE SKIN ONCE A WEEK. 2 mL 6   ezetimibe (ZETIA) 10 MG tablet Take 1 tablet (10 mg total) by mouth daily. 90 tablet 3   famotidine (PEPCID) 10 MG tablet Take 1 tablet (10 mg total) by mouth 2 (two) times daily. 180 tablet 3   furosemide (LASIX) 20 MG tablet TAKE 1 TABLET (20 MG TOTAL) BY MOUTH DAILY. 90 tablet 3   insulin aspart  (NOVOLOG) 100 UNIT/ML FlexPen Inject 10 Units into the skin 3 (three) times daily with meals. 30 mL 3   Insulin Glargine (BASAGLAR KWIKPEN) 100 UNIT/ML Inject 25 Units into the skin 2 (two) times daily. 45 mL 3   Insulin Pen Needle 32G X 4 MM MISC USE AS DIRECTED IN THE MORNING, AT NOON, IN THE EVENING, AND AT BEDTIME. 400 each 0   isosorbide mononitrate (IMDUR) 30 MG 24 hr tablet Take 1 tablet (30 mg total) by mouth daily. 90 tablet 3   losartan (COZAAR) 25 MG tablet Take 1 tablet (25 mg total) by mouth daily. 90 tablet 3   metFORMIN (GLUCOPHAGE) 1000 MG tablet Take 1 tablet (1,000 mg total) by mouth daily. 90 tablet 3   metoprolol succinate (TOPROL XL) 100 MG 24 hr tablet Take 1 tablet (100 mg total) by mouth daily. Take with or immediately following a meal. 90 tablet 3   nitroGLYCERIN (NITROSTAT) 0.4 MG SL tablet PLACE 1 TABLET (0.4 MG TOTAL) UNDER THE TONGUE EVERY 5 (FIVE) MINUTES X 3 DOSES AS NEEDED FOR CHEST PAIN. 25 tablet 3   spironolactone (ALDACTONE) 25 MG tablet Take 0.5 tablets (12.5 mg total) by mouth daily. 45 tablet 3   traZODone (DESYREL) 50 MG tablet Take 0.5-1 tablets (25-50 mg total) by mouth at bedtime  as needed for sleep. 90 tablet 1     Assessment: 56 y.o. male with chest pain and new onset Afib for heparin Goal of Therapy:  Heparin level 0.3-0.7 units/ml Monitor platelets by anticoagulation protocol: Yes   Plan:  Heparin 4000 units IV bolus, then start heparin 1450 units/hr Check heparin level in 6 hours.   Caryl Pina 05/05/2021,4:37 AM

## 2021-05-05 NOTE — Plan of Care (Signed)
  Problem: Activity: Goal: Capacity to carry out activities will improve Outcome: Progressing   Problem: Clinical Measurements: Goal: Respiratory complications will improve Outcome: Progressing   

## 2021-05-05 NOTE — TOC Progression Note (Signed)
Transition of Care [facility]) - Progression Note    Patient Details  Name: Larry Lewis MRN: [id number] Date of Birth: 03-13-1966  [facility]) CM/SW Contact  Leone Haven, RN Phone Number: 05/05/2021, 12:35 PM  Clinical Narrative:    From home, with Acute on chronic heart failure , afib with RVR, multivessel CAD, DM, Morbid obesity.  TOC will continue to follow for dc needs.         Expected Discharge Plan and Services                                                 Social Determinants of Health (SDOH) Interventions    Readmission Risk Interventions No flowsheet data found.

## 2021-05-05 NOTE — Progress Notes (Signed)
Echocardiogram 2D Echocardiogram has been performed.  Larry Lewis 05/05/2021, 2:15 PM

## 2021-05-05 NOTE — Progress Notes (Signed)
ANTICOAGULATION CONSULT NOTE  Pharmacy Consult for Heparin Indication: atrial fibrillation  Allergies  Allergen Reactions   Penicillins Itching    Patient Measurements: Height: 5\' 6"  (167.6 cm) Weight: 96.6 kg (212 lb 15.4 oz) IBW/kg (Calculated) : 63.8 Heparin Dosing Weight: 90 kg  Vital Signs: Temp: 98.3 F (36.8 C) (02/15 1646) Temp Source: Oral (02/15 1646) BP: 126/81 (02/15 1646) Pulse Rate: 98 (02/15 1646)  Labs: Recent Labs    05/05/21 0345 05/05/21 0540 05/05/21 1212 05/05/21 1940  HGB 14.3  --   --   --   HCT 45.1  --   --   --   PLT 318  --   --   --   HEPARINUNFRC  --   --  0.17* 0.32  CREATININE 1.18  --   --   --   TROPONINIHS 91* 919*  --   --      Estimated Creatinine Clearance: 75.1 mL/min (by C-G formula based on SCr of 1.18 mg/dL).    Assessment: 56 y.o. male with chest pain and new onset Afib to continue on IV heparin.  Heparin level therapeutic and low normal; no bleeding reported.  Goal of Therapy:  Heparin level 0.3-0.7 units/ml Monitor platelets by anticoagulation protocol: Yes   Plan:  Increase heparin gtt slightly to 1850 units/hr F/U AM labs  Harrol Novello D. 57, PharmD, BCPS, BCCCP 05/05/2021, 8:32 PM

## 2021-05-05 NOTE — ED Provider Notes (Signed)
[facility] EMERGENCY DEPARTMENT Provider Note   CSN: [id number] Arrival date & time: 05/05/21  0335     History  Chief Complaint  Patient presents with   Atrial Fibrillation    Larry Lewis is a 56 y.o. male.  The history is provided by the patient. A language interpreter was used.  Atrial Fibrillation He has history of hypertension, diabetes, hyperlipidemia, coronary artery disease, combined systolic and diastolic heart failure and comes in with onset 3 hours ago of difficulty breathing with associated sense of heart racing.  He denies chest pain, heaviness, tightness, pressure.  There has been no nausea or vomiting.  He has not had this before.  He was brought in by ambulance he noted atrial fibrillation with heart rate of 210.  He was given a dose of diltiazem with improvement in the heart rate.   Home Medications Prior to Admission medications   Medication Sig Start Date End Date Taking? Authorizing Provider  aspirin EC 81 MG tablet Take 81 mg by mouth daily. Swallow whole.    [provider]  atorvastatin (LIPITOR) 80 MG tablet TAKE 1 TABLET (80 MG TOTAL) BY MOUTH DAILY. 11/11/20 11/11/21  Donato Heinz, MD  busPIRone (BUSPAR) 5 MG tablet TAKE 1 TABLET BY MOUTH TWICE A DAY 04/22/21   [name], NP  clopidogrel (PLAVIX) 75 MG tablet Take 1 tablet (75 mg total) by mouth daily. 11/11/20   Donato Heinz, MD  Continuous Blood Gluc Sensor (DEXCOM G6 SENSOR) MISC 1 Device by Does not apply route as directed. 08/12/20   Shamleffer, Melanie Crazier, MD  Continuous Blood Gluc Transmit (DEXCOM G6 TRANSMITTER) MISC 1 Device by Does not apply route as directed. 08/12/20   Shamleffer, Melanie Crazier, MD  Dulaglutide 0.75 MG/0.5ML SOPN INJECT 0.75 MG INTO THE SKIN ONCE A WEEK. 05/13/20 05/13/21  Shamleffer, Melanie Crazier, MD  ezetimibe (ZETIA) 10 MG tablet Take 1 tablet (10 mg total) by mouth daily. 12/03/20 11/28/21  Donato Heinz, MD   famotidine (PEPCID) 10 MG tablet Take 1 tablet (10 mg total) by mouth 2 (two) times daily. 04/06/21   Donato Heinz, MD  furosemide (LASIX) 20 MG tablet TAKE 1 TABLET (20 MG TOTAL) BY MOUTH DAILY. 11/11/20 11/11/21  Donato Heinz, MD  insulin aspart (NOVOLOG) 100 UNIT/ML FlexPen Inject 10 Units into the skin 3 (three) times daily with meals. 08/12/20   Shamleffer, Melanie Crazier, MD  Insulin Glargine (BASAGLAR KWIKPEN) 100 UNIT/ML Inject 25 Units into the skin 2 (two) times daily. 08/12/20   Shamleffer, Melanie Crazier, MD  Insulin Pen Needle 32G X 4 MM MISC USE AS DIRECTED IN THE MORNING, AT NOON, IN THE EVENING, AND AT BEDTIME. 11/11/20 11/11/21  Donato Heinz, MD  isosorbide mononitrate (IMDUR) 30 MG 24 hr tablet Take 1 tablet (30 mg total) by mouth daily. 12/03/20   Donato Heinz, MD  losartan (COZAAR) 25 MG tablet Take 1 tablet (25 mg total) by mouth daily. 11/11/20 11/06/21  Donato Heinz, MD  metFORMIN (GLUCOPHAGE) 1000 MG tablet Take 1 tablet (1,000 mg total) by mouth daily. 08/12/20   Shamleffer, Melanie Crazier, MD  metoprolol succinate (TOPROL XL) 100 MG 24 hr tablet Take 1 tablet (100 mg total) by mouth daily. Take with or immediately following a meal. 11/11/20 11/06/21  Donato Heinz, MD  nitroGLYCERIN (NITROSTAT) 0.4 MG SL tablet PLACE 1 TABLET (0.4 MG TOTAL) UNDER THE TONGUE EVERY 5 (FIVE) MINUTES X 3 DOSES AS NEEDED FOR  CHEST PAIN. 02/24/20 02/23/21  Donato Heinz, MD  spironolactone (ALDACTONE) 25 MG tablet Take 0.5 tablets (12.5 mg total) by mouth daily. 11/11/20 11/06/21  Donato Heinz, MD  traZODone (DESYREL) 50 MG tablet Take 0.5-1 tablets (25-50 mg total) by mouth at bedtime as needed for sleep. 03/29/21   [name], NP      Allergies    Penicillins    Review of Systems   Review of Systems  All other systems reviewed and are negative.  Physical Exam Updated Vital Signs BP (!) 130/110 (BP Location:  Right Arm)    Pulse (!) 155    Temp 98.2 F (36.8 C) (Temporal)    Resp (!) 40    SpO2 96%  Physical Exam Vitals and nursing note reviewed.  56 year old male, resting comfortably and in no acute distress. Vital signs are significant for elevated blood pressure, heart rate, respiratory rate. Oxygen saturation is 96%, which is normal. Head is normocephalic and atraumatic. PERRLA, EOMI. Oropharynx is clear. Neck is nontender and supple without adenopathy or JVD. Back is nontender and there is no CVA tenderness. Lungs are clear without rales, wheezes, or rhonchi. Chest is nontender. Heart h is tachycardic and irregular without murmur. Abdomen is soft, flat, nontender without masses or hepatosplenomegaly and peristalsis is normoactive. Extremities have no cyanosis or edema, full range of motion is present. Skin is warm and dry without rash. Neurologic: Mental status is normal, cranial nerves are intact, moves all extremities equally.  ED Results / Procedures / Treatments   Labs (all labs ordered are listed, but only abnormal results are displayed) Labs Reviewed  BASIC METABOLIC PANEL - Abnormal; Notable for the following components:      Result Value   Glucose, Bld 353 (*)    All other components within normal limits  CBC WITH DIFFERENTIAL/PLATELET - Abnormal; Notable for the following components:   WBC 12.7 (*)    RBC 5.90 (*)    MCV 76.4 (*)    MCH 24.2 (*)    All other components within normal limits  TROPONIN I (HIGH SENSITIVITY) - Abnormal; Notable for the following components:   Troponin I (High Sensitivity) 91 (*)    All other components within normal limits  RESP PANEL BY RT-PCR (FLU A&B, COVID) ARPGX2  MAGNESIUM  HEPARIN LEVEL (UNFRACTIONATED)    EKG EKG Interpretation  Date/Time:  Wednesday May 05 2021 03:43:35 EST Ventricular Rate:  151 PR Interval:  90 QRS Duration: 104 QT Interval:  276 QTC Calculation: 438 R Axis:   76 Text Interpretation: Atrial  fibrillation with rapid ventricular response Probable LVH with secondary repol abnrm When compared with ECG of 03/25/2020, Atrial fibrillation with rapid ventricular response has replaced Sinus tachycardia Confirmed by Delora Fuel ([id number]) on 05/05/2021 4:18:42 AM  Radiology DG Chest Port 1 View  Result Date: 05/05/2021 CLINICAL DATA:  Shortness of breath, atrial fibrillation. EXAM: PORTABLE CHEST 1 VIEW COMPARISON:  03/24/2020. FINDINGS: The heart size and mediastinal contours are stable. Lung volumes are low and interstitial prominence is noted bilaterally. No consolidation, effusion, or pneumothorax. No acute osseous abnormality. IMPRESSION: 1. Mild interstitial prominence bilaterally, possible edema or infiltrate. 2. Cardiomegaly. Electronically Signed   By: Brett Fairy M.D.   On: 05/05/2021 04:56    Procedures Procedures  Cardiac monitor, per my interpretation, shows atrial fibrillation with rapid ventricular response and wide-complex beats which may be PVCs versus Ashman phenomena.  Medications Ordered in ED Medications  diltiazem (CARDIZEM) 1 mg/mL load  via infusion 20 mg (has no administration in time range)    And  diltiazem (CARDIZEM) 125 mg in dextrose 5% 125 mL (1 mg/mL) infusion (has no administration in time range)    ED Course/ Medical Decision Making/ A&P                           Medical Decision Making Amount and/or Complexity of Data Reviewed Labs: ordered. Radiology: ordered.  Risk Prescription drug management.   Atrial fibrillation with rapid ventricular response.  Twelve-lead ECG confirms atrial fibrillation with rapid ventricular response which is new compared with ECG of 03/25/2020.  Old records are reviewed showing that he is followed by cardiology, no record of prior episodes of atrial fibrillation.  DC cardioversion was recommended, but patient is refusing stating he is afraid that he will get an electric cart and his heart will stop and not restart.  I am unable  to convince him to proceed with cardioversion.  Therefore, rate control is initiated.  He is given diltiazem bolus and infusion and started on heparin.  Rate is adequately controlled.  Chest x-ray does show cardiomegaly and possible changes of heart failure.  I have independently viewed the image, and agree with radiologist's interpretation.  Troponin has come back mildly elevated and felt to represent demand ischemia given degree of tachycardia, felt not to represent ACS.  Case is discussed with Dr. Blossom Hoops of cardiology service who agrees to see the patient and evaluate him for admission.  CRITICAL CARE Performed by: Delora Fuel Total critical care time: 60 minutes Critical care time was exclusive of separately billable procedures and treating other patients. Critical care was necessary to treat or prevent imminent or life-threatening deterioration. Critical care was time spent personally by me on the following activities: development of treatment plan with patient and/or surrogate as well as nursing, discussions with consultants, evaluation of patient's response to treatment, examination of patient, obtaining history from patient or surrogate, ordering and performing treatments and interventions, ordering and review of laboratory studies, ordering and review of radiographic studies, pulse oximetry and re-evaluation of patient's condition.  CHA2DS2/VAS Stroke Risk Points      >= 2 Points: High Risk  1 - 1.99 Points: Medium Risk  0 Points: Low Risk    Last Change: N/A      This score determines the patient's risk of having a stroke if the  patient has atrial fibrillation.     His score is 4 - points given for history of hypertension, diabetes, heart failure, coronary artery disease.         Final Clinical Impression(s) / ED Diagnoses Final diagnoses:  Atrial fibrillation with rapid ventricular response ([place])  Elevated troponin    Rx / DC Orders ED Discharge Orders     None          Delora Fuel, MD [id number] [phone]

## 2021-05-05 NOTE — ED Notes (Signed)
Secretary paged admitting MD for RN to report elevated Trop. result this morning .

## 2021-05-05 NOTE — ED Triage Notes (Signed)
Patient from home, en route to ED and pulled over the side of the road.  Patient found with chest pain radiating to his left arm.  Patient was found in Afib RVR rate of 210.  He was pale and diaphoretic.  EMS gave of fluid with 20mg  of Cardizem en route to ED.  Patient is still in afib, ranging from 110-170, bigeminy PVCs.  Patient is CAOx4, but speak arabic.  No more chest pain but does have HA.  Abdomen is distended.

## 2021-05-05 NOTE — Progress Notes (Signed)
ANTICOAGULATION CONSULT NOTE   Pharmacy Consult for Heparin Indication: atrial fibrillation  Allergies  Allergen Reactions   Penicillins Itching    Patient Measurements:   Heparin Dosing Weight: 90 kg  Vital Signs: Temp: 98.8 F (37.1 C) (02/15 1154) Temp Source: Oral (02/15 1154) BP: 144/95 (02/15 1154) Pulse Rate: 96 (02/15 1145)  Labs: Recent Labs    05/05/21 0345 05/05/21 0540 05/05/21 1212  HGB 14.3  --   --   HCT 45.1  --   --   PLT 318  --   --   HEPARINUNFRC  --   --  0.17*  CREATININE 1.18  --   --   TROPONINIHS 91* 919*  --      CrCl cannot be calculated (Unknown ideal weight.).   Medical History: Past Medical History:  Diagnosis Date   AMI (acute myocardial infarction) ([facility])    Anxiety    Asthma    CHF (congestive heart failure) ([facility])    Coronary artery disease    Fall at home, initial encounter 03/24/2020   High cholesterol    Hypertension     Medications:  No current facility-administered medications on file prior to encounter.   Current Outpatient Medications on File Prior to Encounter  Medication Sig Dispense Refill   aspirin EC 81 MG tablet Take 81 mg by mouth daily. Swallow whole.     atorvastatin (LIPITOR) 80 MG tablet TAKE 1 TABLET (80 MG TOTAL) BY MOUTH DAILY. (Patient taking differently: Take 80 mg by mouth daily.) 90 tablet 3   busPIRone (BUSPAR) 5 MG tablet TAKE 1 TABLET BY MOUTH TWICE A DAY (Patient taking differently: Take 5 mg by mouth 2 (two) times daily.) 120 tablet 0   clopidogrel (PLAVIX) 75 MG tablet Take 1 tablet (75 mg total) by mouth daily. 90 tablet 3   ezetimibe (ZETIA) 10 MG tablet Take 1 tablet (10 mg total) by mouth daily. 90 tablet 3   famotidine (PEPCID) 10 MG tablet Take 1 tablet (10 mg total) by mouth 2 (two) times daily. 180 tablet 3   furosemide (LASIX) 20 MG tablet TAKE 1 TABLET (20 MG TOTAL) BY MOUTH DAILY. (Patient taking differently: Take 20 mg by mouth daily.) 90 tablet 3   guaiFENesin (MUCINEX) 600 MG  12 hr tablet Take 1,200 mg by mouth 2 (two) times daily as needed for to loosen phlegm.     insulin aspart (NOVOLOG) 100 UNIT/ML FlexPen Inject 10 Units into the skin 3 (three) times daily with meals. 30 mL 3   Insulin Glargine (BASAGLAR KWIKPEN) 100 UNIT/ML Inject 25 Units into the skin 2 (two) times daily. 45 mL 3   Insulin Pen Needle 32G X 4 MM MISC USE AS DIRECTED IN THE MORNING, AT NOON, IN THE EVENING, AND AT BEDTIME. 400 each 0   isosorbide mononitrate (IMDUR) 30 MG 24 hr tablet Take 1 tablet (30 mg total) by mouth daily. 90 tablet 3   losartan (COZAAR) 25 MG tablet Take 1 tablet (25 mg total) by mouth daily. 90 tablet 3   metFORMIN (GLUCOPHAGE) 1000 MG tablet Take 1 tablet (1,000 mg total) by mouth daily. 90 tablet 3   metoprolol succinate (TOPROL XL) 100 MG 24 hr tablet Take 1 tablet (100 mg total) by mouth daily. Take with or immediately following a meal. 90 tablet 3   nitroGLYCERIN (NITROSTAT) 0.4 MG SL tablet PLACE 1 TABLET (0.4 MG TOTAL) UNDER THE TONGUE EVERY 5 (FIVE) MINUTES X 3 DOSES AS NEEDED FOR CHEST PAIN. (Patient  taking differently: Place 0.4 mg under the tongue every 5 (five) minutes x 3 doses as needed for chest pain.) 25 tablet 3   spironolactone (ALDACTONE) 25 MG tablet Take 0.5 tablets (12.5 mg total) by mouth daily. 45 tablet 3   traZODone (DESYREL) 50 MG tablet Take 0.5-1 tablets (25-50 mg total) by mouth at bedtime as needed for sleep. 90 tablet 1   Continuous Blood Gluc Sensor (DEXCOM G6 SENSOR) MISC 1 Device by Does not apply route as directed. 3 each 11   Continuous Blood Gluc Transmit (DEXCOM G6 TRANSMITTER) MISC 1 Device by Does not apply route as directed. 1 each 3   Dulaglutide 0.75 MG/0.5ML SOPN INJECT 0.75 MG INTO THE SKIN ONCE A WEEK. 2 mL 6     Assessment: 56 y.o. male with chest pain and new onset Afib for heparin.  Heparin lvl = 0.17, subtherapeutic. No infusion issues noted by RN.  Goal of Therapy:  Heparin level 0.3-0.7 units/ml Monitor platelets by  anticoagulation protocol: Yes   Plan:  Re-bolus heparin 2700 units x1 and increase heparin infusion rate to 1800 units/hr.  Check heparin level in 6 hours.   Doristine Counter 05/05/2021,12:56 PM

## 2021-05-05 NOTE — Progress Notes (Signed)
Progress Note  Patient Name: Larry Lewis Date of Encounter: 05/05/2021  Primary Cardiologist: Donato Heinz, MD   Subjective   Patient seen and examined at his bedside. He was lying in bed awake when I arrived. He denies any chest pain or shortness of breath.  Inpatient Medications    Scheduled Meds:  Continuous Infusions:  heparin 1,450 Units/hr (05/05/21 0501)   PRN Meds:    Vital Signs    Vitals:   05/05/21 0545 05/05/21 0600 05/05/21 0630 05/05/21 0645  BP: 124/84 127/87 120/87 119/85  Pulse: 99 96 96 94  Resp: (!) 21 (!) 22 (!) 22 (!) 23  Temp:      TempSrc:      SpO2: 96% 99% 96% 96%   No intake or output data in the 24 hours ending 05/05/21 0927 There were no vitals filed for this visit.  Telemetry    Afib rate controlled - Personally Reviewed  ECG    Atrial fibrillation with rvr - Personally Reviewed  Physical Exam     General: Comfortable, lying in bed Head: Atraumatic, normal size  Eyes: PEERLA, EOMI  Neck: Supple, normal JVD Cardiac: Normal S1, S2; RRR; 2/6 mid ejection systolic murmurs, rubs, or gallops Lungs: Clear to auscultation bilaterally Abd: Soft, nontender, no hepatomegaly  Ext: warm, no edema Musculoskeletal: No deformities, BUE and BLE strength normal and equal Skin: Warm and dry, no rashes   Neuro: Alert and oriented to person, place, time, and situation, CNII-XII grossly intact, no focal deficits  Psych: Normal mood and affect   Labs    Chemistry Recent Labs  Lab 05/05/21 0345  NA 135  K 3.6  CL 99  CO2 23  GLUCOSE 353*  BUN 12  CREATININE 1.18  CALCIUM 9.5  GFRNONAA >60  ANIONGAP 13     Hematology Recent Labs  Lab 05/05/21 0345  WBC 12.7*  RBC 5.90*  HGB 14.3  HCT 45.1  MCV 76.4*  MCH 24.2*  MCHC 31.7  RDW 13.4  PLT 318    Cardiac EnzymesNo results for input(s): TROPONINI in the last 168 hours. No results for input(s): TROPIPOC in the last 168 hours.   BNP Recent Labs  Lab  05/05/21 0345  BNP 73.4     DDimer No results for input(s): DDIMER in the last 168 hours.   Radiology    DG Chest Port 1 View  Result Date: 05/05/2021 CLINICAL DATA:  Shortness of breath, atrial fibrillation. EXAM: PORTABLE CHEST 1 VIEW COMPARISON:  03/24/2020. FINDINGS: The heart size and mediastinal contours are stable. Lung volumes are low and interstitial prominence is noted bilaterally. No consolidation, effusion, or pneumothorax. No acute osseous abnormality. IMPRESSION: 1. Mild interstitial prominence bilaterally, possible edema or infiltrate. 2. Cardiomegaly. Electronically Signed   By: Brett Fairy M.D.   On: 05/05/2021 04:56    Cardiac Studies   TTE1/07/2020 IMPRESSIONS     1. Left ventricular ejection fraction, by estimation, is 45 to 50%. The  left ventricle has mildly decreased function. The left ventricle  demonstrates global hypokinesis. Left ventricular diastolic parameters are  consistent with Grade I diastolic  dysfunction (impaired relaxation).   2. Right ventricular systolic function is normal. The right ventricular  size is normal. Tricuspid regurgitation signal is inadequate for assessing  PA pressure.   3. The mitral valve is normal in structure. No evidence of mitral valve  regurgitation. No evidence of mitral stenosis.   4. The aortic valve is normal in structure. Aortic valve regurgitation  is  not visualized. No aortic stenosis is present.   5. The inferior vena cava is normal in size with greater than 50%  respiratory variability, suggesting right atrial pressure of 3 mmHg.   FINDINGS   Left Ventricle: Left ventricular ejection fraction, by estimation, is 45  to 50%. The left ventricle has mildly decreased function. The left  ventricle demonstrates global hypokinesis. The left ventricular internal  cavity size was normal in size. There is   no left ventricular hypertrophy. Left ventricular diastolic parameters  are consistent with Grade I diastolic  dysfunction (impaired relaxation).  Normal left ventricular filling pressure.   Right Ventricle: The right ventricular size is normal. No increase in  right ventricular wall thickness. Right ventricular systolic function is  normal. Tricuspid regurgitation signal is inadequate for assessing PA  pressure.   Left Atrium: Left atrial size was normal in size.   Right Atrium: Right atrial size was normal in size.   Pericardium: There is no evidence of pericardial effusion.   Mitral Valve: The mitral valve is normal in structure. Mild mitral annular  calcification. No evidence of mitral valve regurgitation. No evidence of  mitral valve stenosis.   Tricuspid Valve: The tricuspid valve is normal in structure. Tricuspid  valve regurgitation is trivial. No evidence of tricuspid stenosis.   Aortic Valve: The aortic valve is normal in structure. Aortic valve  regurgitation is not visualized. No aortic stenosis is present.   Pulmonic Valve: The pulmonic valve was normal in structure. Pulmonic valve  regurgitation is not visualized. No evidence of pulmonic stenosis.   Aorta: The aortic root is normal in size and structure.   Venous: The inferior vena cava is normal in size with greater than 50%  respiratory variability, suggesting right atrial pressure of 3 mmHg.   IAS/Shunts: No atrial level shunt detected by color flow Doppler.    RHC/LHC 07/09/19: Mid RCA to Dist RCA lesion is 100% stenosed. Acute Mrg lesion is 90% stenosed. Ost RCA to Prox RCA lesion is 70% stenosed. Prox RCA to Mid RCA lesion is 90% stenosed. 1st Mrg lesion is 85% stenosed. 2nd Mrg-1 lesion is 100% stenosed. 2nd Mrg-2 lesion is 100% stenosed. 3rd Mrg lesion is 90% stenosed. Dist Cx lesion is 70% stenosed. Dist LAD lesion is 100% stenosed. 2nd Diag lesion is 95% stenosed. Mid LAD lesion is 50% stenosed. 1st Diag-2 lesion is 40% stenosed. 1st Diag-1 lesion is 80% stenosed. Mid Cx to Dist Cx lesion is 30%  stenosed.   Severe diffuse multivessel CAD with moderate irregularity of the LAD with 80% proximal diagonal stenosis prior to a previously placed stent with intimal hyperplasia within the stented segment, 50% the stenosis, 95% stenosis in the second diagonal vessel and total occlusion of the apical LAD; left circumflex vessel with large OM1 vessel with 85% stenosis proximal to an aneurysmal segment, total occlusion of the OM 2 vessel with previously placed stent in this vessel and 90 and 70% bifurcation stenoses in the distal circumflex; severely diffusely diseased RCA with distal occlusion.  There is faint filling of a probable small PLA vessel via the left injection.   Normal right heart pressures.   LVEDP 16 mmHg.   RECOMMENDATION: Plan initiate medical therapy since at present he is not on any anti-ischemic medications.  Most vessels are not amenable to intervention; however if patient experiences increasing chest pain symptomatology the large OM1 vessel can potentially undergo intervention with stenting.  Aggressive lipid-lowering therapy with target LDL less than 70.  Optimal blood pressure control.  Smoking cessation is essential.  With diffuse CAD consider DAPT therapy.  Patient Profile     56 y.o. male with history of ischemic cardiomyopathy EF 45-50 on echocardiogram done in January 2022, severe multivessel CAD, bicuspid aortic, diabetes mellitus, morbid obesity presented with atrial fibrillation with rapid ventricular rate as well as decompensated heart failure now with elevated troponin  Assessment & Plan    Acute on chronic heart failure with midrange ejection fraction. Atrial fibrillation with rapid ventricular rate Multivessel coronary artery disease Diabetes mellitus Morbid obesity   I suspect this exacerbation was precipitated by his rapid ventricular rate.   Continue patient on IV diuretics with Lasix 40 mg 3 times daily.  Please continue with strict input and output.   Please continue daily weights. In terms of his atrial fibrillation his rate has improved we will continue the current dose of metoprolol succinate 100 mg daily and adjust as appropriate.  Troponin elevated peaked 919, discussed with the patient known multivessel disease for now we will optimize his medical therapy, we will get an echocardiogram to assess for any significant wall motion change further recommendation will be made clinically based on his clinical status response to medical therapy as well as any significant change on his echocardiogram.  This was discussed with the patient all of his questions has been answered.  He is agreeable for this for now.  Continue heparin drip, Plavix, statin as well as Zetia.  No chest pain at this time or shortness of breath.  We will monitor closely.  Continue sliding scale insulin for his diabetes.  Would also have some benefit with SGLT2 inhibitors.  In terms of his ischemic heart myopathy continue Toprol-XL, Aldactone, may benefit from [place].  DVT prophylaxis on heparin drip.  For questions or updates, please contact [place] Please consult [email or web address] for contact info under Cardiology/STEMI.      Signed, Berniece Salines, DO  05/05/2021, 9:27 AM

## 2021-05-05 NOTE — H&P (Signed)
Cardiology Admission History and Physical:   Patient ID: Larry Lewis MRN: [id number]; DOB: [birth year]   Admission date: 05/05/2021  PCP:  [name], NP   [facility] Providers Cardiologist:  Donato Heinz, MD        Chief Complaint:  AF, SOB  Patient Profile:   Larry Lewis is a 56 y.o. male with HFrEF, multivessel CAD, moribd obesity, HTN, tobacco use who is being seen 05/05/2021 for the evaluation of shortness of breath and atrial fibrillation.  History of Present Illness:   Larry Lewis is a 56 year old male with history as above who was brought to the ED by EMS for atrial fibrillation with rapid ventricular response.  He was bolused with 30 mg diltiazem and started on a diltiazem infusion.  By the time I evaluated, his atrial fibrillation had converted to sinus rhythm but presenting ECG confirms AF.  He's been having worsening shortness of breath but no chest pain for the last week or so.  He has NHYA3 symptoms.  He denies recurrent syncope, or worsening lower extremity edema but has had worsening orthopnea and abdominal fluid retention.  He claims adherence to all of his medications but has difficulty naming them.  Per Dr. Newman Nickels recent note:  Larry Lewis is a 56 y.o. male with a hx of severe multivessel CAD, combined systolic and diastolic heart failure, diabetes, hypertension, hyperlipidemia, asthma, tobacco use who presents as a hospital follow-up.  He was admitted to [facility] on 07/05/2019 with acute combined systolic and diastolic heart failure.  He reported a history of CAD, had 2 stents placed in 2007 in [place].  He had not followed with a cardiologist since that time.  He presented with volume overload, and was treated with IV Lasix.  TTE on 07/06/2019 showed LVEF 35 to 40%, normal RV function, possible bicuspid aortic valve (no AI or AS).  He underwent LHC/RHC on 4/20, which showed severe diffuse multivessel CAD, medical management recommended.  Right  heart pressures were normal.  In addition he was diagnosed with type 2 diabetes, A1c 12.2%.  Also found to have LDL 211.  He was lost to follow-up from April through December 2021 as return to [place] as both of his parents were ill.  He was off all his medications for several months.  He was admitted to [facility] from 1/4 through 03/26/2020 after presenting with possible syncope and fall leading to humerus fracture.  He was walking down the stairs at his friend's restaurant and had an episode of lightheadedness and possible sudden loss of consciousness causing him to fall down the stairs and suffer right humeral neck fracture.  Orthostatics were negative.  Echocardiogram on 03/25/2020 showed EF 45 to [id number], grade 1 diastolic dysfunction, normal RV function, no significant valvular disease.  He declined surgery and follow-up with orthopedics was arranged.  Cardiac monitor on 05/22/20 showed NSVT x 6 seconds.   Since last clinic visit, he remains reports that he has been doing okay.  He had been in [place] but returned to [place] last month.  States that while in [place] he did not have all his medications.  Had to take nitroglycerin 3-4 times while there.  Since he returned to the [place] and has been on his meds, states that he has not had to take any nitroglycerin.  Does report he gets short of breath with minimal exertion.  States that he had lower extremity edema when he returned to the [place] but has resolved since he  restarted his Lasix.  He continues to smoke.  Past Medical History:  Diagnosis Date   AMI (acute myocardial infarction) (HCC)    Anxiety    Asthma    CHF (congestive heart failure) (HCC)    Coronary artery disease    Fall at home, initial encounter 03/24/2020   High cholesterol    Hypertension     Past Surgical History:  Procedure Laterality Date   CARDIAC CATHETERIZATION     RIGHT/LEFT HEART CATH AND CORONARY ANGIOGRAPHY N/A 07/09/2019   Procedure: RIGHT/LEFT HEART CATH AND CORONARY ANGIOGRAPHY;   Surgeon: Lennette Bihari, MD;  Location: [facility] INVASIVE CV LAB;  Service: Cardiovascular;  Laterality: N/A;     Medications Prior to Admission: Prior to Admission medications   Medication Sig Start Date End Date Taking? Authorizing Provider  aspirin EC 81 MG tablet Take 81 mg by mouth daily. Swallow whole.   Yes [provider]  atorvastatin (LIPITOR) 80 MG tablet TAKE 1 TABLET (80 MG TOTAL) BY MOUTH DAILY. 11/11/20 11/11/21 Yes Little Ishikawa, MD  busPIRone (BUSPAR) 5 MG tablet TAKE 1 TABLET BY MOUTH TWICE A DAY 04/22/21  Yes Grayce Sessions, NP  clopidogrel (PLAVIX) 75 MG tablet Take 1 tablet (75 mg total) by mouth daily. 11/11/20  Yes Little Ishikawa, MD  Continuous Blood Gluc Sensor (DEXCOM G6 SENSOR) MISC 1 Device by Does not apply route as directed. 08/12/20  Yes Shamleffer, Konrad Dolores, MD  Continuous Blood Gluc Transmit (DEXCOM G6 TRANSMITTER) MISC 1 Device by Does not apply route as directed. 08/12/20  Yes Shamleffer, Konrad Dolores, MD  Dulaglutide 0.75 MG/0.5ML SOPN INJECT 0.75 MG INTO THE SKIN ONCE A WEEK. 05/13/20 05/13/21 Yes Shamleffer, Konrad Dolores, MD  ezetimibe (ZETIA) 10 MG tablet Take 1 tablet (10 mg total) by mouth daily. 12/03/20 11/28/21 Yes Little Ishikawa, MD  famotidine (PEPCID) 10 MG tablet Take 1 tablet (10 mg total) by mouth 2 (two) times daily. 04/06/21  Yes Little Ishikawa, MD  furosemide (LASIX) 20 MG tablet TAKE 1 TABLET (20 MG TOTAL) BY MOUTH DAILY. 11/11/20 11/11/21 Yes Little Ishikawa, MD  insulin aspart (NOVOLOG) 100 UNIT/ML FlexPen Inject 10 Units into the skin 3 (three) times daily with meals. 08/12/20  Yes Shamleffer, Konrad Dolores, MD  Insulin Glargine (BASAGLAR KWIKPEN) 100 UNIT/ML Inject 25 Units into the skin 2 (two) times daily. 08/12/20  Yes Shamleffer, Konrad Dolores, MD  Insulin Pen Needle 32G X 4 MM MISC USE AS DIRECTED IN THE MORNING, AT NOON, IN THE EVENING, AND AT BEDTIME. 11/11/20 11/11/21 Yes Little Ishikawa, MD  isosorbide mononitrate (IMDUR) 30 MG 24 hr tablet Take 1 tablet (30 mg total) by mouth daily. 12/03/20  Yes Little Ishikawa, MD  losartan (COZAAR) 25 MG tablet Take 1 tablet (25 mg total) by mouth daily. 11/11/20 11/06/21 Yes Little Ishikawa, MD  metFORMIN (GLUCOPHAGE) 1000 MG tablet Take 1 tablet (1,000 mg total) by mouth daily. 08/12/20  Yes Shamleffer, Konrad Dolores, MD  metoprolol succinate (TOPROL XL) 100 MG 24 hr tablet Take 1 tablet (100 mg total) by mouth daily. Take with or immediately following a meal. 11/11/20 11/06/21 Yes Little Ishikawa, MD  spironolactone (ALDACTONE) 25 MG tablet Take 0.5 tablets (12.5 mg total) by mouth daily. 11/11/20 11/06/21 Yes Little Ishikawa, MD  traZODone (DESYREL) 50 MG tablet Take 0.5-1 tablets (25-50 mg total) by mouth at bedtime as needed for sleep. 03/29/21  Yes Grayce Sessions, NP  nitroGLYCERIN (NITROSTAT) 0.4  MG SL tablet PLACE 1 TABLET (0.4 MG TOTAL) UNDER THE TONGUE EVERY 5 (FIVE) MINUTES X 3 DOSES AS NEEDED FOR CHEST PAIN. 02/24/20 02/23/21  Donato Heinz, MD     Allergies:    Allergies  Allergen Reactions   Penicillins Itching    Social History:   Social History   Socioeconomic History   Marital status: Single    Spouse name: Not on file   Number of children: Not on file   Years of education: Not on file   Highest education level: Not on file  Occupational History   Not on file  Tobacco Use   Smoking status: Every Day    Packs/day: 1.00    Years: 0.50    Pack years: 0.50    Types: Cigarettes   Smokeless tobacco: Never  Vaping Use   Vaping Use: Never used  Substance and Sexual Activity   Alcohol use: Never   Drug use: Never   Sexual activity: Not on file  Other Topics Concern   Not on file  Social History Narrative   Not on file   Social Determinants of Health   Financial Resource Strain: Not on file  Food Insecurity: Not on file  Transportation Needs: Not on file   Physical Activity: Not on file  Stress: Not on file  Social Connections: Not on file  Intimate Partner Violence: Not on file    Family History:   The patient's reviewed and noncontributory.  ROS:  Please see the history of present illness.  All other ROS reviewed and negative.     Physical Exam/Data:   Vitals:   05/05/21 0425 05/05/21 0500 05/05/21 0510 05/05/21 0525  BP:  (!) 138/114    Pulse: (!) 106 (!) 109 (!) 107 (!) 107  Resp: (!) 28 (!) 24 17 18   Temp:      TempSrc:      SpO2: 97% 98% 96% 94%   No intake or output data in the 24 hours ending 05/05/21 0529 Last 3 Weights 04/06/2021 03/29/2021 11/11/2020  Weight (lbs) 213 lb 6.4 oz 212 lb 12.8 oz 201 lb  Weight (kg) 96.798 kg 96.525 kg 91.173 kg     There is no height or weight on file to calculate BMI.  General:  Well nourished, well developed, in no acute distress HEENT: normal Neck: JVP difficult to assess but elevated. Vascular: No carotid bruits; Distal pulses 2+ bilaterally   Cardiac:  normal S1, S2; RRR; AS murmur present Lungs:  clear to auscultation bilaterally, no wheezing, rhonchi or rales  Abd: soft, nontender, no hepatomegaly  Ext: no edema Musculoskeletal:  No deformities, BUE and BLE strength normal and equal Skin: warm and dry  Neuro:  CNs 2-12 intact, no focal abnormalities noted Psych:  Normal affect   EKG:  AF w/ RVR at presentation.  Relevant CV Studies: TTE 07/06/19:  1. Left ventricular ejection fraction, by estimation, is 35 to 40%. The  left ventricle has moderately decreased function. The left ventricle  demonstrates regional wall motion abnormalities (see scoring  diagram/findings for description). Left ventricular   diastolic parameters are indeterminate.   2. Right ventricular systolic function is normal. The right ventricular  size is normal.   3. Left atrial size was moderately dilated.   4. The mitral valve is normal in structure. Mild mitral valve  regurgitation. No evidence of  mitral stenosis.   5. Indeterminate cusp structure. Given age and amount of calcification,  high suspicion for bicuspid valve, either  functional or congenital.. The  aortic valve has an indeterminant number of cusps. Aortic valve  regurgitation is not visualized. Mild to  moderate aortic valve sclerosis/calcification is present, without any  evidence of aortic stenosis.   6. The inferior vena cava is normal in size with greater than 50%  respiratory variability, suggesting right atrial pressure of 3 mmHg.    RHC/LHC 07/09/19: Mid RCA to Dist RCA lesion is 100% stenosed. Acute Mrg lesion is 90% stenosed. Ost RCA to Prox RCA lesion is 70% stenosed. Prox RCA to Mid RCA lesion is 90% stenosed. 1st Mrg lesion is 85% stenosed. 2nd Mrg-1 lesion is 100% stenosed. 2nd Mrg-2 lesion is 100% stenosed. 3rd Mrg lesion is 90% stenosed. Dist Cx lesion is 70% stenosed. Dist LAD lesion is 100% stenosed. 2nd Diag lesion is 95% stenosed. Mid LAD lesion is 50% stenosed. 1st Diag-2 lesion is 40% stenosed. 1st Diag-1 lesion is 80% stenosed. Mid Cx to Dist Cx lesion is 30% stenosed.   Severe diffuse multivessel CAD with moderate irregularity of the LAD with 80% proximal diagonal stenosis prior to a previously placed stent with intimal hyperplasia within the stented segment, 50% the stenosis, 95% stenosis in the second diagonal vessel and total occlusion of the apical LAD; left circumflex vessel with large OM1 vessel with 85% stenosis proximal to an aneurysmal segment, total occlusion of the OM 2 vessel with previously placed stent in this vessel and 90 and 70% bifurcation stenoses in the distal circumflex; severely diffusely diseased RCA with distal occlusion.  There is faint filling of a probable small PLA vessel via the left injection.   Normal right heart pressures.   LVEDP 16 mmHg.   RECOMMENDATION: Plan initiate medical therapy since at present he is not on any anti-ischemic medications.  Most vessels  are not amenable to intervention; however if patient experiences increasing chest pain symptomatology the large OM1 vessel can potentially undergo intervention with stenting.  Aggressive lipid-lowering therapy with target LDL less than 70.  Optimal blood pressure control.  Smoking cessation is essential.  With diffuse CAD consider DAPT therapy.  Laboratory Data:  High Sensitivity Troponin:   Recent Labs  Lab 05/05/21 0345  TROPONINIHS 91*      Chemistry Recent Labs  Lab 05/05/21 0345  NA 135  K 3.6  CL 99  CO2 23  GLUCOSE 353*  BUN 12  CREATININE 1.18  CALCIUM 9.5  MG 1.8  GFRNONAA >60  ANIONGAP 13    No results for input(s): PROT, ALBUMIN, AST, ALT, ALKPHOS, BILITOT in the last 168 hours. Lipids No results for input(s): CHOL, TRIG, HDL, LABVLDL, LDLCALC, CHOLHDL in the last 168 hours. Hematology Recent Labs  Lab 05/05/21 0345  WBC 12.7*  RBC 5.90*  HGB 14.3  HCT 45.1  MCV 76.4*  MCH 24.2*  MCHC 31.7  RDW 13.4  PLT 318   Thyroid No results for input(s): TSH, FREET4 in the last 168 hours. BNPNo results for input(s): BNP, PROBNP in the last 168 hours.  DDimer No results for input(s): DDIMER in the last 168 hours.   Radiology/Studies:  DG Chest Port 1 View  Result Date: 05/05/2021 CLINICAL DATA:  Shortness of breath, atrial fibrillation. EXAM: PORTABLE CHEST 1 VIEW COMPARISON:  03/24/2020. FINDINGS: The heart size and mediastinal contours are stable. Lung volumes are low and interstitial prominence is noted bilaterally. No consolidation, effusion, or pneumothorax. No acute osseous abnormality. IMPRESSION: 1. Mild interstitial prominence bilaterally, possible edema or infiltrate. 2. Cardiomegaly. Electronically Signed   By:  Brett Fairy M.D.   On: 05/05/2021 04:56     Assessment and Plan:   This is a 56 year old male with ischemic cardiomyopathy, LVEF 45-50% most recently, severe multivessel CAD, ?bicuspid AV, and syncope with NSVT on ambulatory monitor as well as  diabetes and morbid obesity presented with atrial fibrillation with RVR who presents with decompensated heart failure.  Unclear precipitant as he endorses medication adherence.  Suspect decompensated heart failure as the precipitant of his atrial fibrillation.  #Acute on chronic systolic and diastolic heart failure - Check BNP - Lasix 40 mg IV TID - Resume home metoprolol succinate 100 mg daily - Resume home 25 mg daily; titrate as tolerated once decongested - Continue home spironolactone 12 mg daily - Would benefit from SLT2i  #AF w/ RVR, paroxysmal, C2V4 - Heparin initiated in ED; transition to [place] if no procedures planned. - Metoprolol as above.  No AAD for now  #Multivessel CAD - Hold aspirin with anticoagulant recommendation - Continue plavix - Continue home statin - Continue ezetemibe  #?AS.  Murmur present on exam. - Repeat TTE  Risk Assessment/Risk Scores:       New York Heart Association (NYHA) Functional Class NYHA Class III  CHA2DS2-VASc Score = 4        Severity of Illness: The appropriate patient status for this patient is OBSERVATION. Observation status is judged to be reasonable and necessary in order to provide the required intensity of service to ensure the patient's safety. The patient's presenting symptoms, physical exam findings, and initial radiographic and laboratory data in the context of their medical condition is felt to place them at decreased risk for further clinical deterioration. Furthermore, it is anticipated that the patient will be medically stable for discharge from the hospital within 2 midnights of admission.    For questions or updates, please contact [place] Please consult [email or web address] for contact info under     Signed, Delight Hoh, MD  05/05/2021 5:29 AM

## 2021-05-06 ENCOUNTER — Telehealth (HOSPITAL_COMMUNITY): Payer: Self-pay | Admitting: Surgery

## 2021-05-06 ENCOUNTER — Other Ambulatory Visit (HOSPITAL_COMMUNITY): Payer: Self-pay

## 2021-05-06 LAB — GLUCOSE, CAPILLARY
Glucose-Capillary: 223 mg/dL — ABNORMAL HIGH (ref 70–99)
Glucose-Capillary: 210 mg/dL — ABNORMAL HIGH (ref 70–99)

## 2021-05-06 LAB — BASIC METABOLIC PANEL
Anion gap: 13 (ref 5–15)
BUN: 16 mg/dL (ref 6–20)
CO2: 25 mmol/L (ref 22–32)
Calcium: 9.6 mg/dL (ref 8.9–10.3)
Chloride: 97 mmol/L — ABNORMAL LOW (ref 98–111)
Creatinine, Ser: 1.05 mg/dL (ref 0.61–1.24)
GFR, Estimated: >60 mL/min (ref >60)
Glucose, Bld: 210 mg/dL — ABNORMAL HIGH (ref 70–99)
Potassium: 3.4 mmol/L — ABNORMAL LOW (ref 3.5–5.1)
Sodium: 135 mmol/L (ref 135–145)

## 2021-05-06 LAB — MAGNESIUM: Magnesium: 1.9 mg/dL (ref 1.7–2.4)

## 2021-05-06 LAB — TROPONIN I (HIGH SENSITIVITY)
Troponin I (High Sensitivity): 2880 ng/L (ref <18)
Troponin I (High Sensitivity): 2717 ng/L (ref <18)

## 2021-05-06 LAB — CBC
HCT: 45.7 % (ref 39.0–52.0)
Hemoglobin: 14.6 g/dL (ref 13.0–17.0)
MCH: 24.3 pg — ABNORMAL LOW (ref 26.0–34.0)
MCHC: 31.9 g/dL (ref 30.0–36.0)
MCV: 76 fL — ABNORMAL LOW (ref 80.0–100.0)
Platelets: 321 10*3/uL (ref 150–400)
RBC: 6.01 MIL/uL — ABNORMAL HIGH (ref 4.22–5.81)
RDW: 13.7 % (ref 11.5–15.5)
WBC: 12.1 10*3/uL — ABNORMAL HIGH (ref 4.0–10.5)
nRBC: 0 % (ref 0.0–0.2)

## 2021-05-06 LAB — HEPARIN LEVEL (UNFRACTIONATED): Heparin Unfractionated: 0.33 IU/mL (ref 0.30–0.70)

## 2021-05-06 MED ORDER — POTASSIUM CHLORIDE CRYS ER 20 MEQ PO TBCR
40.0000 meq | EXTENDED_RELEASE_TABLET | Freq: Once | ORAL | Status: AC
  Administered 2021-05-06: 40 meq via ORAL
  Filled 2021-05-06: qty 2

## 2021-05-06 MED ORDER — APIXABAN 5 MG PO TABS
5.0000 mg | ORAL_TABLET | Freq: Two times a day (BID) | ORAL | Status: DC
Start: 2021-05-06 — End: 2021-05-06
  Administered 2021-05-06: 5 mg via ORAL
  Filled 2021-05-06: qty 1

## 2021-05-06 MED ORDER — POTASSIUM CHLORIDE CRYS ER 20 MEQ PO TBCR
10.0000 meq | EXTENDED_RELEASE_TABLET | Freq: Every day | ORAL | 6 refills | Status: DC
  Filled 2021-05-06: qty 30, 60d supply, fill #0

## 2021-05-06 MED ORDER — APIXABAN 5 MG PO TABS
5.0000 mg | ORAL_TABLET | Freq: Two times a day (BID) | ORAL | 11 refills | Status: DC
Start: 2021-05-06 — End: 2021-07-12
  Filled 2021-05-06: qty 180, 90d supply, fill #0

## 2021-05-06 MED ORDER — DAPAGLIFLOZIN PROPANEDIOL 10 MG PO TABS
10.0000 mg | ORAL_TABLET | Freq: Every day | ORAL | 6 refills | Status: DC
  Filled 2021-05-06: qty 90, 90d supply, fill #0

## 2021-05-06 MED ORDER — SPIRONOLACTONE 25 MG PO TABS
12.5000 mg | ORAL_TABLET | Freq: Every day | ORAL | 3 refills | Status: DC
  Filled 2021-05-06: qty 45, 90d supply, fill #0

## 2021-05-06 MED ORDER — DAPAGLIFLOZIN PROPANEDIOL 10 MG PO TABS
10.0000 mg | ORAL_TABLET | Freq: Every day | ORAL | Status: DC
  Administered 2021-05-06: 10 mg via ORAL
  Filled 2021-05-06: qty 1

## 2021-05-06 MED ORDER — ACETAMINOPHEN 325 MG PO TABS: 650.0000 mg | ORAL_TABLET | ORAL | Status: DC | PRN

## 2021-05-06 MED ORDER — FUROSEMIDE 40 MG PO TABS: 40.0000 mg | ORAL_TABLET | Freq: Every day | ORAL | Status: DC

## 2021-05-06 MED ORDER — POTASSIUM CHLORIDE CRYS ER 10 MEQ PO TBCR: 10.0000 meq | EXTENDED_RELEASE_TABLET | Freq: Two times a day (BID) | ORAL | Status: DC

## 2021-05-06 MED ORDER — FUROSEMIDE 40 MG PO TABS
40.0000 mg | ORAL_TABLET | Freq: Every day | ORAL | 3 refills | Status: DC
  Filled 2021-05-06: qty 90, 90d supply, fill #0

## 2021-05-06 MED ORDER — POTASSIUM CHLORIDE CRYS ER 20 MEQ PO TBCR
20.0000 meq | EXTENDED_RELEASE_TABLET | Freq: Every day | ORAL | 6 refills | Status: DC
  Filled 2021-05-06: qty 30, 30d supply, fill #0

## 2021-05-06 NOTE — Discharge Summary (Signed)
Discharge Summary    Patient ID: Larry Lewis MRN: [id number]; DOB: 04/03/1965  Admit date: 05/05/2021 Discharge date: 05/06/2021  PCP:  Grayce SessionsEdwards, Michelle P, NP   [facility] Prov[facility] Cardiologist:  [place]awahristopher L Schumann, MD        Discharge Diagnoses    Principal Problem:   Acute on chronic heart failure [facility](HCC)    Diagnostic Studies/Procedures    ECHO: 05/05/2021  1. Left ventricular ejection fraction, by estimation, is 45 to 50%. The  left ventricle has mildly decreased function. The left ventricle  demonstrates regional wall motion abnormalities (see scoring  diagram/findings for description). Left ventricular  diastolic parameters are consistent with Grade I diastolic dysfunction  (impaired relaxation). There is akinesis of the left ventricular,  basal-mid inferior wall and inferolateral wall.   2. Right ventricular systolic function is normal. The right ventricular  size is normal.   3. Left atrial size was mildly dilated.   4. The mitral valve is normal in structure. Trivial mitral valve  regurgitation. No evidence of mitral stenosis.   5. The aortic valve is tricuspid. There is moderate calcification of the  aortic valve. Aortic valve regurgitation is trivial. Mild aortic valve  stenosis. Aortic valve area, by VTI measures 1.76 cm. Aortic valve mean  gradient measures 11.0 mmHg. Aortic  valve Vmax measures 2.22 m/s.   6. The inferior vena cava is normal in size with greater than 50%  respiratory variability, suggesting right atrial pressure of 3 mmHg.  _____________   History of Present Illness     Larry Lewis is a 56 y.o. male with  history of ischemic cardiomyopathy EF 45-50 on echocardiogram done in January 2022, severe multivessel CAD, bicuspid aortic, diabetes mellitus, morbid obesity who presented 02/15 with atrial fibrillation with rapid ventricular rate as well as decompensated heart failure and elevated troponin    Hospital Course     Consultants:  None  Acute on chronic combined CHF with EF 45-50% and grade 1 DD by echo this admission: He got a total of 4 doses of Lasix 40 mg IV since admission.  His volume status is much improved, weight down 5 pounds overnight.  Discharge weight 207.9 pounds.  I/O net -3.4 L.  Continue Lasix at an increased dose of 40 mg daily at home.  He will be started on Farxiga.  His potassium was slightly low after diuresis and will be supplemented prior to discharge.  He will be started on potassium as an outpatient as well.  He is also to continue current dose of beta-blocker and spironolactone 12.5 mg daily  Atrial fibrillation RVR: He was given IV diltiazem in the emergency room.  He takes Toprol-XL 100 mg daily at home, continue this.  He spontaneously converted to sinus rhythm.  He was started on Eliquis 5 mg twice daily for CHA2DS2-VASc of 3.  Because of the Eliquis, his aspirin will be discontinued and he will remain on Plavix along with the Eliquis.  CAD/elevated troponin: Initial troponin was 91>> 919 >>  2880>> 2717.  This was felt to be demand ischemia secondary to a combination of rapid ventricular rate from the atrial fibrillation as well as heart failure exacerbation.  No chest pain.  He has known CAD with an occluded RCA and OM 2 plus and an 80% diagonal and occluded distal LAD by catheter 07/09/2019.  In the absence of ischemic symptoms, no additional evaluation is indicated.  Continue current dose of Imdur for anti-anginal therapy as well as Lipitor 80 mg/Zetia  for cholesterol.  Diabetes: His A1c this admission was 9.4.  When Dr. Gaynelle Arabian saw him on 1/17, he stated that he had recently been to [place] and had not been on all of his meds while there.  He has restarted his diabetes medications and is encouraged to stick tightly to a low-sodium diabetic diet.  On 2/16, the patient was seen by Dr. Servando Salina and all data were reviewed.  No further inpatient work-up is indicated and he is considered stable for discharge,  to follow-up as an outpatient.  CHA2DS2-VASc Score = 3  The patient's score is based upon: CHF History: 1 HTN History: 1 Diabetes History: 0 Stroke History: 0 Vascular Disease History: 1 Age Score: 0 Gender Score: 0   Signed,  Theodore Demark, PA-C    05/06/2021 11:58 AM     Did the patient have an acute coronary syndrome (MI, NSTEMI, STEMI, etc) this admission?:  No.   The elevated Troponin was due to the acute medical illness (demand ischemia).   The patient will be scheduled for a TOC follow up appointment in 14 days.  A message has been sent to the [facility] and Scheduling Pool at the office where the patient should be seen for follow up.  _____________  Discharge Vitals Blood pressure 115/75, pulse 87, temperature 98.4 F (36.9 C), temperature source Oral, resp. rate 20, height 5\' 6"  (1.676 m), weight 94.3 kg, SpO2 94 %.  Filed Weights   05/05/21 1300 05/06/21 0409  Weight: 96.6 kg 94.3 kg    Labs & Radiologic Studies    CBC Recent Labs    05/05/21 0345 05/06/21 0419  WBC 12.7* 12.1*  NEUTROABS 7.5  --   HGB 14.3 14.6  HCT 45.1 45.7  MCV 76.4* 76.0*  PLT 318 321   Basic Metabolic Panel Recent Labs    05/08/21 0345 05/05/21 1121 05/06/21 0419  NA 135  --  135  K 3.6  --  3.4*  CL 99  --  97*  CO2 23  --  25  GLUCOSE 353*  --  210*  BUN 12  --  16  CREATININE 1.18  --  1.05  CALCIUM 9.5  --  9.6  MG 1.8 1.7 1.9   Liver Function Tests No results for input(s): AST, ALT, ALKPHOS, BILITOT, PROT, ALBUMIN in the last 72 hours. No results for input(s): LIPASE, AMYLASE in the last 72 hours. High Sensitivity Troponin:   Recent Labs  Lab 05/05/21 0345 05/05/21 0540 05/06/21 0436 05/06/21 0703  TROPONINIHS 91* 919* 2,880* 2,717*    BNP Invalid input(s): POCBNP D-Dimer No results for input(s): DDIMER in the last 72 hours. Hemoglobin A1C Recent Labs    05/05/21 1123  HGBA1C 9.4*   Fasting Lipid Panel Lab Results  Component Value Date   CHOL 139  04/06/2021   HDL 34 (L) 04/06/2021   LDLCALC 65 04/06/2021   TRIG 247 (H) 04/06/2021   CHOLHDL 4.1 04/06/2021    Thyroid Function Tests Recent Labs    05/05/21 1026  TSH 1.311   _____________  DG Chest Port 1 View  Result Date: 05/05/2021 CLINICAL DATA:  Shortness of breath, atrial fibrillation. EXAM: PORTABLE CHEST 1 VIEW COMPARISON:  03/24/2020. FINDINGS: The heart size and mediastinal contours are stable. Lung volumes are low and interstitial prominence is noted bilaterally. No consolidation, effusion, or pneumothorax. No acute osseous abnormality. IMPRESSION: 1. Mild interstitial prominence bilaterally, possible edema or infiltrate. 2. Cardiomegaly. Electronically Signed   By: 05/22/2020.D.  On: 05/05/2021 04:56   ECHOCARDIOGRAM COMPLETE  Result Date: 05/05/2021    ECHOCARDIOGRAM REPORT   Patient Name:   PRUDENCIO KOLLER Date of Exam: 05/05/2021 Medical Rec #:  [id number]   Height:       66.0 in Accession #:    [id number]  Weight:       213.0 lb Date of Birth:  1965/09/27    BSA:          2.054 m Patient Age:    56 years    BP:           143/103 mmHg Patient Gender: M           HR:           94 bpm. Exam Location:  Inpatient Procedure: 2D Echo Indications:    Abnormal EKG  History:        Patient has prior history of Echocardiogram examinations, most                 recent 03/25/2020. CHF, Acute MI and CAD; Risk                 Factors:Hypertension.  Sonographer:    Devonne Doughty Referring Phys: [id number] CARRIEL T NIPP IMPRESSIONS  1. Left ventricular ejection fraction, by estimation, is 45 to 50%. The left ventricle has mildly decreased function. The left ventricle demonstrates regional wall motion abnormalities (see scoring diagram/findings for description). Left ventricular diastolic parameters are consistent with Grade I diastolic dysfunction (impaired relaxation). There is akinesis of the left ventricular, basal-mid inferior wall and inferolateral wall.  2. Right ventricular systolic function  is normal. The right ventricular size is normal.  3. Left atrial size was mildly dilated.  4. The mitral valve is normal in structure. Trivial mitral valve regurgitation. No evidence of mitral stenosis.  5. The aortic valve is tricuspid. There is moderate calcification of the aortic valve. Aortic valve regurgitation is trivial. Mild aortic valve stenosis. Aortic valve area, by VTI measures 1.76 cm. Aortic valve mean gradient measures 11.0 mmHg. Aortic valve Vmax measures 2.22 m/s.  6. The inferior vena cava is normal in size with greater than 50% respiratory variability, suggesting right atrial pressure of 3 mmHg. FINDINGS  Left Ventricle: Left ventricular ejection fraction, by estimation, is 45 to 50%. The left ventricle has mildly decreased function. The left ventricle demonstrates regional wall motion abnormalities. The left ventricular internal cavity size was normal in size. There is no left ventricular hypertrophy. Left ventricular diastolic parameters are consistent with Grade I diastolic dysfunction (impaired relaxation). Right Ventricle: The right ventricular size is normal. No increase in right ventricular wall thickness. Right ventricular systolic function is normal. Left Atrium: Left atrial size was mildly dilated. Right Atrium: Right atrial size was normal in size. Pericardium: There is no evidence of pericardial effusion. Mitral Valve: The mitral valve is normal in structure. Trivial mitral valve regurgitation. No evidence of mitral valve stenosis. Tricuspid Valve: The tricuspid valve is normal in structure. Tricuspid valve regurgitation is trivial. No evidence of tricuspid stenosis. Aortic Valve: The aortic valve is tricuspid. There is moderate calcification of the aortic valve. Aortic valve regurgitation is trivial. Mild aortic stenosis is present. Aortic valve mean gradient measures 11.0 mmHg. Aortic valve peak gradient measures 19.7 mmHg. Aortic valve area, by VTI measures 1.76 cm. Pulmonic  Valve: The pulmonic valve was grossly normal. Pulmonic valve regurgitation is trivial. No evidence of pulmonic stenosis. Aorta: The aortic root is normal in size and structure. Venous:  The inferior vena cava is normal in size with greater than 50% respiratory variability, suggesting right atrial pressure of 3 mmHg. IAS/Shunts: No atrial level shunt detected by color flow Doppler.  LEFT VENTRICLE PLAX 2D LVIDd:         4.28 cm   Diastology LVIDs:         3.47 cm   LV e' medial:    4.35 cm/s LV PW:         0.99 cm   LV E/e' medial:  15.8 LV IVS:        0.99 cm   LV e' lateral:   7.51 cm/s LVOT diam:     2.40 cm   LV E/e' lateral: 9.2 LV SV:         67 LV SV Index:   33 LVOT Area:     4.52 cm  RIGHT VENTRICLE RV Basal diam:  3.34 cm RV Mid diam:    3.45 cm RV S prime:     11.60 cm/s TAPSE (M-mode): 1.9 cm LEFT ATRIUM             Index        RIGHT ATRIUM           Index LA diam:        3.80 cm 1.85 cm/m   RA Area:     17.10 cm 8.33 cm/m LA Vol (A2C):   54.3 ml 26.44 ml/m LA Vol (A4C):   52.8 ml 25.71 ml/m LA Biplane Vol: 54.4 ml 26.49 ml/m  AORTIC VALVE AV Area (Vmax):    1.77 cm AV Area (Vmean):   1.72 cm AV Area (VTI):     1.76 cm AV Vmax:           221.67 cm/s AV Vmean:          156.000 cm/s AV VTI:            0.380 m AV Peak Grad:      19.7 mmHg AV Mean Grad:      11.0 mmHg LVOT Vmax:         86.70 cm/s LVOT Vmean:        59.300 cm/s LVOT VTI:          0.148 m LVOT/AV VTI ratio: 0.39  AORTA Ao Root diam: 3.40 cm Ao Asc diam:  3.30 cm MITRAL VALVE MV Area (PHT): 3.06 cm     SHUNTS MV Decel Time: 248 msec     Systemic VTI:  0.15 m MV E velocity: 68.90 cm/s   Systemic Diam: 2.40 cm MV A velocity: 102.00 cm/s MV E/A ratio:  0.68 Arvilla Meres MD Electronically signed by Arvilla Meres MD Signature Date/Time: 05/05/2021/6:57:58 PM    Final    Disposition   Pt is being discharged home today in good condition.  Follow-up Plans & Appointments     Discharge Instructions     Diet - low sodium heart  healthy   Complete by: As directed    Diet Carb Modified   Complete by: As directed    Increase activity slowly   Complete by: As directed        Discharge Medications   Allergies as of 05/06/2021       Reactions   Penicillins Itching        Medication List     STOP taking these medications    aspirin EC 81 MG tablet       TAKE these medications  acetaminophen 325 MG tablet Commonly known as: TYLENOL Take 2 tablets (650 mg total) by mouth every 4 (four) hours as needed for headache or mild pain.   apixaban 5 MG Tabs tablet Commonly known as: ELIQUIS Take 1 tablet (5 mg total) by mouth 2 (two) times daily.   atorvastatin 80 MG tablet Commonly known as: LIPITOR TAKE 1 TABLET (80 MG TOTAL) BY MOUTH DAILY. What changed: how much to take   Basaglar KwikPen 100 UNIT/ML Inject 25 Units into the skin 2 (two) times daily.   busPIRone 5 MG tablet Commonly known as: BUSPAR TAKE 1 TABLET BY MOUTH TWICE A DAY   clopidogrel 75 MG tablet Commonly known as: PLAVIX Take 1 tablet (75 mg total) by mouth daily.   dapagliflozin propanediol 10 MG Tabs tablet Commonly known as: FARXIGA Take 1 tablet (10 mg total) by mouth daily. Start taking on: May 07, 2021   Dexcom G6 Sensor Misc 1 Device by Does not apply route as directed.   Dexcom G6 Transmitter Misc 1 Device by Does not apply route as directed.   Dulaglutide 0.75 MG/0.5ML Sopn INJECT 0.75 MG INTO THE SKIN ONCE A WEEK.   ezetimibe 10 MG tablet Commonly known as: ZETIA Take 1 tablet (10 mg total) by mouth daily.   famotidine 10 MG tablet Commonly known as: PEPCID Take 1 tablet (10 mg total) by mouth 2 (two) times daily.   furosemide 40 MG tablet Commonly known as: LASIX Take 1 tablet (40 mg total) by mouth daily. What changed:  medication strength how much to take   guaiFENesin 600 MG 12 hr tablet Commonly known as: MUCINEX Take 1,200 mg by mouth 2 (two) times daily as needed for to loosen  phlegm.   insulin aspart 100 UNIT/ML FlexPen Commonly known as: NOVOLOG Inject 10 Units into the skin 3 (three) times daily with meals.   Insulin Pen Needle 32G X 4 MM Misc USE AS DIRECTED IN THE MORNING, AT NOON, IN THE EVENING, AND AT BEDTIME.   isosorbide mononitrate 30 MG 24 hr tablet Commonly known as: IMDUR Take 1 tablet (30 mg total) by mouth daily.   losartan 25 MG tablet Commonly known as: COZAAR Take 1 tablet (25 mg total) by mouth daily.   metFORMIN 1000 MG tablet Commonly known as: GLUCOPHAGE Take 1 tablet (1,000 mg total) by mouth daily.   metoprolol succinate 100 MG 24 hr tablet Commonly known as: Toprol XL Take 1 tablet (100 mg total) by mouth daily. Take with or immediately following a meal.   nitroGLYCERIN 0.4 MG SL tablet Commonly known as: NITROSTAT PLACE 1 TABLET (0.4 MG TOTAL) UNDER THE TONGUE EVERY 5 (FIVE) MINUTES X 3 DOSES AS NEEDED FOR CHEST PAIN. What changed: how much to take   potassium chloride SA 20 MEQ tablet Commonly known as: KLOR-CON M Take 1 tablet (20 mEq total) by mouth daily.   spironolactone 25 MG tablet Commonly known as: ALDACTONE Take 0.5 tablets (12.5 mg total) by mouth daily.   traZODone 50 MG tablet Commonly known as: DESYREL Take 0.5-1 tablets (25-50 mg total) by mouth at bedtime as needed for sleep.           Outstanding Labs/Studies   None  Duration of Discharge Encounter   Greater than 30 minutes including physician time.  Signed, Theodore Demarkhonda Deniz Eskridge, PA-C 05/06/2021, 11:58 AM

## 2021-05-06 NOTE — TOC Transition Note (Addendum)
Transition of Care [facility] - CM/SW Discharge Note   Patient Details  Name: Larry Lewis MRN: [id number] Date of Birth: 1965/09/08  Transition of Care [facility]) CM/SW Contact:  Leone Haven, RN Phone Number: 05/06/2021, 12:26 PM   Clinical Narrative:    Patient is for dc today, he is from home, he has transportation  at dc, he is indep.  He will be on eliquis which has a 40 .00 co pay and he is aware, [facility] will give him the 10.00 copay card as well.  He does have Medicaid prepaid but states he is not aware of his copays amt. TOC to fill medications. He has a scale at home and he states he checks his bp sometimes. He eats a low sodium diet.   Final next level of care: Home/Self Care Barriers to Discharge: No Barriers Identified   Patient Goals and CMS Choice Patient states their goals for this hospitalization and ongoing recovery are:: return home   Choice offered to / list presented to : NA  Discharge Placement                       Discharge Plan and Services                  DME Agency: NA       HH Arranged: NA          Social Determinants of Health (SDOH) Interventions     Readmission Risk Interventions No flowsheet data found.

## 2021-05-06 NOTE — TOC Benefit Eligibility Note (Signed)
Patient Product/process development scientist completed.    The patient is currently admitted and upon discharge could be taking Eliquis 5 mg.  The current 30 day co-pay is, $0.00.   The patient is currently admitted and upon discharge could be taking Jardiance 10 mg.  The current 30 day co-pay is, $0.00.   The patient is currently admitted and upon discharge could be taking Farxiga 10 mg.  The current 30 day co-pay is, $0.00.   The patient is currently admitted and upon discharge could be taking Entresto 24-26 mg.  Requires Prior Authorization  The patient is insured through [employer]     Roland Earl, CPhT Pharmacy Patient Advocate Specialist [facility] Patient Advocate Team Direct Number: [phone]  Fax: [phone]

## 2021-05-06 NOTE — Progress Notes (Signed)
Progress Note  Patient Name: Larry Lewis Date of Encounter: 05/06/2021  Primary Cardiologist: Donato Heinz, MD   Subjective   Patient seen and examined at his bedside. He was lying in bed awake when I arrived. He denies any chest pain or shortness of breath.  Inpatient Medications    Scheduled Meds:  apixaban  5 mg Oral BID   atorvastatin  80 mg Oral Daily   busPIRone  5 mg Oral BID   clopidogrel  75 mg Oral Daily   dapagliflozin propanediol  10 mg Oral Daily   ezetimibe  10 mg Oral Daily   famotidine  10 mg Oral BID   furosemide  40 mg Intravenous TID   insulin aspart  0-20 Units Subcutaneous TID WC   insulin aspart  0-5 Units Subcutaneous QHS   insulin aspart  6 Units Subcutaneous TID WC   insulin glargine-yfgn  15 Units Subcutaneous QHS   isosorbide mononitrate  30 mg Oral Daily   losartan  25 mg Oral Daily   metoprolol succinate  100 mg Oral Daily   sodium chloride flush  3 mL Intravenous Q12H   spironolactone  12.5 mg Oral Daily   Continuous Infusions:  sodium chloride     PRN Meds:    Vital Signs    Vitals:   05/05/21 1646 05/05/21 2033 05/06/21 0041 05/06/21 0409  BP: 126/81 123/82 112/83 115/75  Pulse: 98 95 94 87  Resp: 20 (!) 22 19 20   Temp: 98.3 F (36.8 C) 99.6 F (37.6 C) 99 F (37.2 C) 98.4 F (36.9 C)  TempSrc: Oral Oral Oral Oral  SpO2: 97% 94% 93% 94%  Weight:    94.3 kg  Height:        Intake/Output Summary (Last 24 hours) at 05/06/2021 1112 Last data filed at 05/06/2021 [id number] Gross per 24 hour  Intake 1425.4 ml  Output 4600 ml  Net -3174.6 ml   Filed Weights   05/05/21 1300 05/06/21 0409  Weight: 96.6 kg 94.3 kg    Telemetry    Sinus rhythm - Personally Reviewed  ECG    None today - Personally Reviewed  Physical Exam     General: Comfortable, lying in bed Head: Atraumatic, normal size  Eyes: PEERLA, EOMI  Neck: Supple, normal JVD Cardiac: Normal S1, S2; RRR; 2/6 mid ejection systolic murmurs, rubs, or  gallops Lungs: Clear to auscultation bilaterally Abd: Soft, nontender, no hepatomegaly  Ext: warm, no edema Musculoskeletal: No deformities, BUE and BLE strength normal and equal Skin: Warm and dry, no rashes   Neuro: Alert and oriented to person, place, time, and situation, CNII-XII grossly intact, no focal deficits  Psych: Normal mood and affect   Labs    Chemistry Recent Labs  Lab 05/05/21 0345 05/06/21 0419  NA 135 135  K 3.6 3.4*  CL 99 97*  CO2 23 25  GLUCOSE 353* 210*  BUN 12 16  CREATININE 1.18 1.05  CALCIUM 9.5 9.6  GFRNONAA >60 >60  ANIONGAP 13 13     Hematology Recent Labs  Lab 05/05/21 0345 05/06/21 0419  WBC 12.7* 12.1*  RBC 5.90* 6.01*  HGB 14.3 14.6  HCT 45.1 45.7  MCV 76.4* 76.0*  MCH 24.2* 24.3*  MCHC 31.7 31.9  RDW 13.4 13.7  PLT 318 321    Cardiac EnzymesNo results for input(s): TROPONINI in the last 168 hours. No results for input(s): TROPIPOC in the last 168 hours.   BNP Recent Labs  Lab 05/05/21 0345  BNP 73.4     DDimer No results for input(s): DDIMER in the last 168 hours.   Radiology    DG Chest Port 1 View  Result Date: 05/05/2021 CLINICAL DATA:  Shortness of breath, atrial fibrillation. EXAM: PORTABLE CHEST 1 VIEW COMPARISON:  03/24/2020. FINDINGS: The heart size and mediastinal contours are stable. Lung volumes are low and interstitial prominence is noted bilaterally. No consolidation, effusion, or pneumothorax. No acute osseous abnormality. IMPRESSION: 1. Mild interstitial prominence bilaterally, possible edema or infiltrate. 2. Cardiomegaly. Electronically Signed   By: Brett Fairy M.D.   On: 05/05/2021 04:56   ECHOCARDIOGRAM COMPLETE  Result Date: 05/05/2021    ECHOCARDIOGRAM REPORT   Patient Name:   Larry Lewis Date of Exam: 05/05/2021 Medical Rec #:  [id number]   Height:       66.0 in Accession #:    [id number]  Weight:       213.0 lb Date of Birth:  1965-04-23    BSA:          2.054 m Patient Age:    56 years    BP:            143/103 mmHg Patient Gender: M           HR:           94 bpm. Exam Location:  Inpatient Procedure: 2D Echo Indications:    Abnormal EKG  History:        Patient has prior history of Echocardiogram examinations, most                 recent 03/25/2020. CHF, Acute MI and CAD; Risk                 Factors:Hypertension.  Sonographer:    Arlyss Gandy Referring Phys: [id number] CARRIEL T NIPP IMPRESSIONS  1. Left ventricular ejection fraction, by estimation, is 45 to 50%. The left ventricle has mildly decreased function. The left ventricle demonstrates regional wall motion abnormalities (see scoring diagram/findings for description). Left ventricular diastolic parameters are consistent with Grade I diastolic dysfunction (impaired relaxation). There is akinesis of the left ventricular, basal-mid inferior wall and inferolateral wall.  2. Right ventricular systolic function is normal. The right ventricular size is normal.  3. Left atrial size was mildly dilated.  4. The mitral valve is normal in structure. Trivial mitral valve regurgitation. No evidence of mitral stenosis.  5. The aortic valve is tricuspid. There is moderate calcification of the aortic valve. Aortic valve regurgitation is trivial. Mild aortic valve stenosis. Aortic valve area, by VTI measures 1.76 cm. Aortic valve mean gradient measures 11.0 mmHg. Aortic valve Vmax measures 2.22 m/s.  6. The inferior vena cava is normal in size with greater than 50% respiratory variability, suggesting right atrial pressure of 3 mmHg. FINDINGS  Left Ventricle: Left ventricular ejection fraction, by estimation, is 45 to 50%. The left ventricle has mildly decreased function. The left ventricle demonstrates regional wall motion abnormalities. The left ventricular internal cavity size was normal in size. There is no left ventricular hypertrophy. Left ventricular diastolic parameters are consistent with Grade I diastolic dysfunction (impaired relaxation). Right Ventricle: The  right ventricular size is normal. No increase in right ventricular wall thickness. Right ventricular systolic function is normal. Left Atrium: Left atrial size was mildly dilated. Right Atrium: Right atrial size was normal in size. Pericardium: There is no evidence of pericardial effusion. Mitral Valve: The mitral valve is normal in structure. Trivial mitral valve regurgitation. No evidence  of mitral valve stenosis. Tricuspid Valve: The tricuspid valve is normal in structure. Tricuspid valve regurgitation is trivial. No evidence of tricuspid stenosis. Aortic Valve: The aortic valve is tricuspid. There is moderate calcification of the aortic valve. Aortic valve regurgitation is trivial. Mild aortic stenosis is present. Aortic valve mean gradient measures 11.0 mmHg. Aortic valve peak gradient measures 19.7 mmHg. Aortic valve area, by VTI measures 1.76 cm. Pulmonic Valve: The pulmonic valve was grossly normal. Pulmonic valve regurgitation is trivial. No evidence of pulmonic stenosis. Aorta: The aortic root is normal in size and structure. Venous: The inferior vena cava is normal in size with greater than 50% respiratory variability, suggesting right atrial pressure of 3 mmHg. IAS/Shunts: No atrial level shunt detected by color flow Doppler.  LEFT VENTRICLE PLAX 2D LVIDd:         4.28 cm   Diastology LVIDs:         3.47 cm   LV e' medial:    4.35 cm/s LV PW:         0.99 cm   LV E/e' medial:  15.8 LV IVS:        0.99 cm   LV e' lateral:   7.51 cm/s LVOT diam:     2.40 cm   LV E/e' lateral: 9.2 LV SV:         67 LV SV Index:   33 LVOT Area:     4.52 cm  RIGHT VENTRICLE RV Basal diam:  3.34 cm RV Mid diam:    3.45 cm RV S prime:     11.60 cm/s TAPSE (M-mode): 1.9 cm LEFT ATRIUM             Index        RIGHT ATRIUM           Index LA diam:        3.80 cm 1.85 cm/m   RA Area:     17.10 cm 8.33 cm/m LA Vol (A2C):   54.3 ml 26.44 ml/m LA Vol (A4C):   52.8 ml 25.71 ml/m LA Biplane Vol: 54.4 ml 26.49 ml/m  AORTIC  VALVE AV Area (Vmax):    1.77 cm AV Area (Vmean):   1.72 cm AV Area (VTI):     1.76 cm AV Vmax:           221.67 cm/s AV Vmean:          156.000 cm/s AV VTI:            0.380 m AV Peak Grad:      19.7 mmHg AV Mean Grad:      11.0 mmHg LVOT Vmax:         86.70 cm/s LVOT Vmean:        59.300 cm/s LVOT VTI:          0.148 m LVOT/AV VTI ratio: 0.39  AORTA Ao Root diam: 3.40 cm Ao Asc diam:  3.30 cm MITRAL VALVE MV Area (PHT): 3.06 cm     SHUNTS MV Decel Time: 248 msec     Systemic VTI:  0.15 m MV E velocity: 68.90 cm/s   Systemic Diam: 2.40 cm MV A velocity: 102.00 cm/s MV E/A ratio:  0.68 Glori Bickers MD Electronically signed by Glori Bickers MD Signature Date/Time: 05/05/2021/6:57:58 PM    Final     Cardiac Studies   TTE1/07/2020 IMPRESSIONS   1. Left ventricular ejection fraction, by estimation, is 45 to 50%. The left ventricle has mildly decreased function.  The left ventricle demonstrates global hypokinesis. Left ventricular diastolic parameters are consistent with Grade I diastolic  dysfunction (impaired relaxation).   2. Right ventricular systolic function is normal. The right ventricular size is normal. Tricuspid regurgitation signal is inadequate for assessing PA pressure.   3. The mitral valve is normal in structure. No evidence of mitral valve regurgitation. No evidence of mitral stenosis.   4. The aortic valve is normal in structure. Aortic valve regurgitation is not visualized. No aortic stenosis is present.   5. The inferior vena cava is normal in size with greater than 50% respiratory variability, suggesting right atrial pressure of 3 mmHg.   FINDINGS   Left Ventricle: Left ventricular ejection fraction, by estimation, is 45 to 50%. The left ventricle has mildly decreased function. The left ventricle demonstrates global hypokinesis. The left ventricular internal cavity size was normal in size. There is   no left ventricular hypertrophy. Left ventricular diastolic parameters are  consistent with Grade I diastolic dysfunction (impaired relaxation).  Normal left ventricular filling pressure.   Right Ventricle: The right ventricular size is normal. No increase in  right ventricular wall thickness. Right ventricular systolic function is  normal. Tricuspid regurgitation signal is inadequate for assessing PA  pressure.   Left Atrium: Left atrial size was normal in size.   Right Atrium: Right atrial size was normal in size.   Pericardium: There is no evidence of pericardial effusion.   Mitral Valve: The mitral valve is normal in structure. Mild mitral annular  calcification. No evidence of mitral valve regurgitation. No evidence of  mitral valve stenosis.   Tricuspid Valve: The tricuspid valve is normal in structure. Tricuspid  valve regurgitation is trivial. No evidence of tricuspid stenosis.   Aortic Valve: The aortic valve is normal in structure. Aortic valve  regurgitation is not visualized. No aortic stenosis is present.   Pulmonic Valve: The pulmonic valve was normal in structure. Pulmonic valve  regurgitation is not visualized. No evidence of pulmonic stenosis.   Aorta: The aortic root is normal in size and structure.   Venous: The inferior vena cava is normal in size with greater than 50%  respiratory variability, suggesting right atrial pressure of 3 mmHg.   IAS/Shunts: No atrial level shunt detected by color flow Doppler.    RHC/LHC 07/09/19: Mid RCA to Dist RCA lesion is 100% stenosed. Acute Mrg lesion is 90% stenosed. Ost RCA to Prox RCA lesion is 70% stenosed. Prox RCA to Mid RCA lesion is 90% stenosed. 1st Mrg lesion is 85% stenosed. 2nd Mrg-1 lesion is 100% stenosed. 2nd Mrg-2 lesion is 100% stenosed. 3rd Mrg lesion is 90% stenosed. Dist Cx lesion is 70% stenosed. Dist LAD lesion is 100% stenosed. 2nd Diag lesion is 95% stenosed. Mid LAD lesion is 50% stenosed. 1st Diag-2 lesion is 40% stenosed. 1st Diag-1 lesion is 80% stenosed. Mid Cx  to Dist Cx lesion is 30% stenosed.   Severe diffuse multivessel CAD with moderate irregularity of the LAD with 80% proximal diagonal stenosis prior to a previously placed stent with intimal hyperplasia within the stented segment, 50% the stenosis, 95% stenosis in the second diagonal vessel and total occlusion of the apical LAD; left circumflex vessel with large OM1 vessel with 85% stenosis proximal to an aneurysmal segment, total occlusion of the OM 2 vessel with previously placed stent in this vessel and 90 and 70% bifurcation stenoses in the distal circumflex; severely diffusely diseased RCA with distal occlusion.  There is faint filling of a probable  small PLA vessel via the left injection.   Normal right heart pressures.   LVEDP 16 mmHg.   RECOMMENDATION: Plan initiate medical therapy since at present he is not on any anti-ischemic medications.  Most vessels are not amenable to intervention; however if patient experiences increasing chest pain symptomatology the large OM1 vessel can potentially undergo intervention with stenting.  Aggressive lipid-lowering therapy with target LDL less than 70.  Optimal blood pressure control.  Smoking cessation is essential.  With diffuse CAD consider DAPT therapy.  Patient Profile     56 y.o. male with history of ischemic cardiomyopathy EF 45-50 on echocardiogram done in January 2022, severe multivessel CAD, bicuspid aortic, diabetes mellitus, morbid obesity presented with atrial fibrillation with rapid ventricular rate as well as decompensated heart failure now with elevated troponin  Assessment & Plan    Acute on chronic heart failure with midrange ejection fraction -  Atrial fibrillation with rapid ventricular rate - now back in sinus rhythm Multivessel coronary artery disease Diabetes mellitus Morbid obesity  Clinically euvolemic-has responded well to Lasix.  We will continue Lasix 40 mg daily at home. Thankfully he is converted to sinus rhythm.  We  will continue patient on his metoprolol succinate 100 mg daily. He has been transitioned to Eliquis 5 mg twice daily have educated the patient about this medication. I have educated patient on the side effects of this medication. I also urge the patient to abstain from any taking behaviors.  The patient understands that he is now at a high risk of bleeding due to being on anticoagulation.  He was also advised that if she ever falls and especially hit his head to be seen in the emergency department.  His elevated troponin was suspected to be in the setting of his rapid ventricular rate as well as his exacerbation of heart failure.  He has had no angina symptoms.  Therefore the patient was continued on his medical regimen no need for any invasive diagnostic testings at this time.  We will also start patient on SGL 2 inhibitors with [place].  He is clinically stable to be discharged home.  For questions or updates, please contact [place] Please consult [email or web address] for contact info under Cardiology/STEMI.      Signed, Lalisa Kiehn, DO  05/06/2021, 11:12 AM

## 2021-05-06 NOTE — Telephone Encounter (Signed)
Heart Failure Nurse Navigator Progress Note  Patient called in reference to follow-up appointment as he was already discharged from the hospital.  He was screened and deemed appropriate to be seen in the HF [facility] for post hospital follow-up. I reviewed directions to get to the clinic and purpose of appt.  His friend was grateful for the information and will be sure that he makes it to appt.

## 2021-05-06 NOTE — Discharge Instructions (Signed)

## 2021-05-06 NOTE — Progress Notes (Signed)
ANTICOAGULATION CONSULT NOTE  Pharmacy Consult for Heparin Indication: atrial fibrillation  Allergies  Allergen Reactions   Penicillins Itching    Patient Measurements: Height: 5\' 6"  (167.6 cm) Weight: 94.3 kg (207 lb 14.4 oz) (scale b) IBW/kg (Calculated) : 63.8 Heparin Dosing Weight: 90 kg  Vital Signs: Temp: 98.4 F (36.9 C) (02/16 0409) Temp Source: Oral (02/16 0409) BP: 115/75 (02/16 0409) Pulse Rate: 87 (02/16 0409)  Labs: Recent Labs    05/05/21 0345 05/05/21 0540 05/05/21 1212 05/05/21 1940 05/06/21 0419 05/06/21 0436  HGB 14.3  --   --   --  14.6  --   HCT 45.1  --   --   --  45.7  --   PLT 318  --   --   --  321  --   HEPARINUNFRC  --   --  0.17* 0.32 0.33  --   CREATININE 1.18  --   --   --  1.05  --   TROPONINIHS 91* 919*  --   --   --  2,880*     Estimated Creatinine Clearance: 83.4 mL/min (by C-G formula based on SCr of 1.05 mg/dL).    Assessment: 56 y.o. male with chest pain and new onset Afib to continue on IV heparin.  Heparin level therapeutic; no bleeding reported.  Goal of Therapy:  Heparin level 0.3-0.7 units/ml Monitor platelets by anticoagulation protocol: Yes   Plan:  Continue heparin at current rate of 1850 units/hr. Monitor heparin levels daily with AM labs  Luisa Hart, PharmD, BCPS Clinical Pharmacist 05/06/2021 7:41 AM   Please refer to [facility] for pharmacy phone number

## 2021-05-07 ENCOUNTER — Telehealth: Payer: Self-pay

## 2021-05-07 ENCOUNTER — Other Ambulatory Visit (INDEPENDENT_AMBULATORY_CARE_PROVIDER_SITE_OTHER): Payer: Self-pay | Admitting: Primary Care

## 2021-05-07 DIAGNOSIS — F411 Generalized anxiety disorder: Secondary | ICD-10-CM

## 2021-05-07 NOTE — Telephone Encounter (Signed)
Transition Care Management Unsuccessful Follow-up Telephone Call  Date of discharge and from where:  [facility] on 05/07/2021  Attempts:  1st Attempt  Reason for unsuccessful TCM follow-up call:  Left voice message unable to reach pt at this time.  Pt has scheduled appt with NP Edwards on 05/31/2021

## 2021-05-10 ENCOUNTER — Telehealth: Payer: Self-pay

## 2021-05-10 NOTE — Telephone Encounter (Signed)
Transition Care Management Unsuccessful Follow-up Telephone Call  Call placed with assistance of Arabic interpreter # [phone]/[employer].   Date of discharge and from where:  05/06/2021, [facility]  Attempts:  2nd Attempt  Reason for unsuccessful TCM follow-up call:  Left voice message with patient's friend, [name]. Call back requested to this CM.  [name] stated that the patient was not there.  Mohamed speaks [place].   Patient has appointment with Gwinda Passe, NP at [facility] - 05/31/2021.

## 2021-05-10 NOTE — Telephone Encounter (Signed)
Requested early, sent via Interface. Has supply until 06/20/21  Requested Prescriptions  Pending Prescriptions Disp Refills   busPIRone (BUSPAR) 5 MG tablet [Pharmacy Med Name: BUSPIRONE HCL 5 MG TABLET] 180 tablet 1    Sig: TAKE 1 TABLET BY MOUTH TWICE A DAY     Psychiatry: Anxiolytics/Hypnotics - Non-controlled Passed - 05/07/2021  8:00 PM      Passed - Valid encounter within last 12 months    Recent Outpatient Visits          1 month ago Colon cancer screening   [facility] Grayce Sessions, NP   11 months ago Urinary frequency   [facility] Grayce Sessions, NP   1 year ago Generalized anxiety disorder   [facility] Mayers, Cari S, PA-C   1 year ago Generalized anxiety disorder   [facility] Grayce Sessions, NP   1 year ago Tobacco abuse   [facility] Grayce Sessions, NP      Future Appointments            In 3 weeks Randa Evens Kinnie Scales, NP [facility]   In 2 months Little Ishikawa, MD [facility]

## 2021-05-11 ENCOUNTER — Telehealth: Payer: Self-pay

## 2021-05-11 NOTE — Telephone Encounter (Signed)
Transition Care Management Follow-up Telephone Call  Call placed with assistance of Arabic Interpreter # [phone] [employer] Date of discharge and from where: 05/06/2021, [facility]  How have you been since you were released from the hospital? He said he is " not good, not bad." When asked to explain he said that he continues to have chest pain. He stated that he had this pain before he went to the hospital and he had experienced this pain for a long time. He reports that the pain can be mild at times but it never completely goes away. It feels like someone is pressing on his chest. Right now,he rates the pain 2-3/10. He stated that he has some shortness of breath and his heart will feel like it's racing at times and he has tingling in his left arm. Marland Kitchen He reports some nausea and dizziness. He said that the dizziness comes and goes, it may last 10-20 minutes and can occur when he is up as well as when he is laying down. He said that he took a " heart pill" under his tongue yesterday but the pain will not go away.  He also said that the pills under his tongue give him a headache and he doesn't want to take them. This CM instructed him to go to ED now to have this pain and these symptoms evaluated, he can call 911. He said that he " can manage it" and did not need to go to ED.  When asked if he called his cardiologist about the chest pain, he said no, this CM also instructed him to notify the cardiologist of the pain but he really needsto go to the ED now for an evaluation. Informed him that if he does not receive the appropriate care/interventions in a timely manner, it could result in death.  He said he understood and then said " thank you."  Any questions or concerns? Yes -noted above.   Items Reviewed: Did the pt receive and understand the discharge instructions provided? Yes  - he said he read them with an Technical brewer Medications obtained and verified? Yes  - he said he has all of  his medications and did not have any questions about his med regime.  He also noted that he is taking everything as ordered.  Other? No  Any new allergies since your discharge? No  Dietary orders reviewed? Yes Do you have support at home? Yes   Home Care and Equipment/Supplies: Were home health services ordered? no If so, what is the name of the agency? N/a  Has the agency set up a time to come to the patient's home? not applicable Were any new equipment or medical supplies ordered?  No What is the name of the medical supply agency? N/a Were you able to get the supplies/equipment? not applicable Do you have any questions related to the use of the equipment or supplies? No  He does not have a scale or BP monitor yet.   Functional Questionnaire: (I = Independent and D = Dependent) ADLs: independent   Follow up appointments reviewed:  PCP Hospital f/u appt confirmed? Yes  Scheduled to see Juluis Mire, NP on 05/17/2021.  [facility] f/u appt confirmed? Yes  Scheduled to see [facility] V[facility] - 05/18/2021. Are transportation arrangements needed? No ,he stated that his friend will drive him. If their condition worsens, is the pt aware to call PCP or go to the Emergency Dept.? Yes  Was the patient provided  with contact information for the PCP's office or ED? Yes Was to pt encouraged to call back with questions or concerns? Yes

## 2021-05-14 ENCOUNTER — Telehealth (HOSPITAL_COMMUNITY): Payer: Self-pay

## 2021-05-14 ENCOUNTER — Other Ambulatory Visit (HOSPITAL_COMMUNITY): Payer: Self-pay

## 2021-05-14 NOTE — Telephone Encounter (Signed)
Transitions of Care Pharmacy   Call attempted for a pharmacy transitions of care follow-up. Unable to reach patient - patient currently unavailable  Call attempt #1. Will follow-up in 2-3 days.

## 2021-05-17 ENCOUNTER — Encounter (INDEPENDENT_AMBULATORY_CARE_PROVIDER_SITE_OTHER): Payer: Self-pay | Admitting: Primary Care

## 2021-05-17 ENCOUNTER — Ambulatory Visit (INDEPENDENT_AMBULATORY_CARE_PROVIDER_SITE_OTHER): Payer: Medicaid Other | Admitting: Primary Care

## 2021-05-17 ENCOUNTER — Other Ambulatory Visit: Payer: Self-pay

## 2021-05-17 VITALS — BP 136/81 | HR 90 | Temp 97.8°F | Ht 69.0 in | Wt 210.6 lb

## 2021-05-17 DIAGNOSIS — I2510 Atherosclerotic heart disease of native coronary artery without angina pectoris: Secondary | ICD-10-CM | POA: Diagnosis not present

## 2021-05-17 DIAGNOSIS — Z720 Tobacco use: Secondary | ICD-10-CM

## 2021-05-17 DIAGNOSIS — Z794 Long term (current) use of insulin: Secondary | ICD-10-CM

## 2021-05-17 DIAGNOSIS — E1165 Type 2 diabetes mellitus with hyperglycemia: Secondary | ICD-10-CM | POA: Diagnosis not present

## 2021-05-17 DIAGNOSIS — F411 Generalized anxiety disorder: Secondary | ICD-10-CM | POA: Diagnosis not present

## 2021-05-17 DIAGNOSIS — Z09 Encounter for follow-up examination after completed treatment for conditions other than malignant neoplasm: Secondary | ICD-10-CM

## 2021-05-17 NOTE — Progress Notes (Addendum)
HEART & VASCULAR TRANSITION OF CARE CONSULT NOTE     Referring Physician: Dr. Harriet Masson Primary Care: Juluis Mire, NP Primary Cardiologist: Dr. Gardiner Rhyme  HPI: Referred to clinic by Dr. Harriet Masson with Cardiology for heart failure consultation. 56 y.o. male with history of HFrEF, multivessel CAD, morbid obesity, HTN, tobacco use, GERD. He had 2 stents placed in [place], [place] in 07-Jul-2005.   Did not follow-up with Cardiology until admitted 07-08-19 with volume overloaad. [facility] 07/09/19 with an occluded RCA, 85% OM1 and occluded OM2, 80% diagonal and occluded distal LAD. RA mean 3, PA 23/13, PCWP 12, LVEDP 18, Fick CO/CI 4.89/2.56. Had been out of medications several months. Echo with EF 35-40%, RV okay, mild MR.  He was admitted to [facility] from 1/4 through 03/26/2020 after presenting with possible syncope and fall leading to humerus fracture.  Orthostatics were negative.  Echocardiogram on 03/25/2020 showed EF 45 to [id number], grade 1 diastolic dysfunction, normal RV function, no significant valvular disease.  He declined surgery and follow-up with orthopedics was arranged.  Cardiac monitor on 05/22/20 showed NSVT x 6 seconds.  Loop recorder recommended to monitor VT burden but he declined.  Admitted with atrial fibrillation with RVR on 05/05/21. He was started on diltiazem gtt and converted to SR. Had elevated troponin up to 07-08-2878. Felt to be secondary to demand ischemia in setting of AF with RVR and acute on chronic CHF.  Medical management recommended. Coarse c/b acute on chronic CHF. Diuresed with IV lasix then transitioned to 40 mg furosemide daily. Discharge weight 207.9 lb. Echo 05/05/21: EF 45-50%, akinesis left ventricular, basal-mid inferior wall and inferolateral wall, RV okay, mild aortic valve stenosis with mean gradient 11 mmHg.  Patient is accompanied by his friend, whom also acts as a Optometrist with patient's consent. The patient has been staying with his friend since his parents passed away in 07/08/2019. He has  been out of work for 12 years and reports he is applying for disability. Less dyspnea since hospital discharge but still gets short of breath with exertion. No orthopnea, PND or lower extremity edema. Hoem weight ranging between 205-209 lb.   Reports intermittent left-sided chest pains that occur randomly and last 1-2 minutes before resolving spontaneously. He reports these pains have occurred on a chronic basis for a couple of years. Details difficult to discern. Not predictable with exertion. Also describes a burning type of chest pain. Has been taking famotidine for GERD but is concerned it is not working.   Has occasional lightheadedness but no recurrent presyncope or syncope.  Has been dealing with quite a bit of anxiety surrounding his health and finances. Saw his PCP yesterday and was referred to psychiatry.  He is taking all medications as prescribed.  Continues to smoke 3-4 cigarettes a day.    Review of Systems: [y] = yes, [ ]  = no   General: Weight gain [ ] ; Weight loss [ ] ; Anorexia [ ] ; Fatigue [ ] ; Fever [ ] ; Chills [ ] ; Weakness [ ]   Cardiac: Chest pain/pressure [Y]; Resting SOB [ ] ; Exertional SOB [Y]; Orthopnea [ ] ; Pedal Edema [ ] ; Palpitations [ ] ; Syncope [ ] ; Presyncope [ ] ; Paroxysmal nocturnal dyspnea[ ]   Pulmonary: Cough [ ] ; Wheezing[ ] ; Hemoptysis[ ] ; Sputum [ ] ; Snoring [ ]   GI: Vomiting[ ] ; Dysphagia[ ] ; Melena[ ] ; Hematochezia [ ] ; Heartburn[Y]; Abdominal pain [ ] ; Constipation [ ] ; Diarrhea [ ] ; BRBPR [ ]   GU: Hematuria[ ] ; Dysuria [ ] ; Nocturia[ ]   Vascular: Pain  in legs with walking [ ] ; Pain in feet with lying flat [ ] ; Non-healing sores [ ] ; Stroke [ ] ; TIA [ ] ; Slurred speech [ ] ;  Neuro: Headaches[ ] ; Vertigo[ ] ; Seizures[ ] ; Paresthesias[ ] ;Blurred vision [ ] ; Diplopia [ ] ; Vision changes [ ]   Ortho/Skin: Arthritis [ ] ; Joint pain [ ] ; Muscle pain [ ] ; Joint swelling [ ] ; Back Pain [ ] ; Rash [ ]   Psych: Depression[ ] ; Anxiety[Y]  Heme: Bleeding problems [  ]; Clotting disorders [ ] ; Anemia [ ]   Endocrine: Diabetes [Y]; Thyroid dysfunction[ ]    Past Medical History:  Diagnosis Date   AMI (acute myocardial infarction) ([place])    Anxiety    Asthma    CHF (congestive heart failure) ([place])    Coronary artery disease    Fall at home, initial encounter 03/24/2020   High cholesterol    Hypertension     Current Outpatient Medications  Medication Sig Dispense Refill   acetaminophen (TYLENOL) 325 MG tablet Take 2 tablets (650 mg total) by mouth every 4 (four) hours as needed for headache or mild pain.     apixaban (ELIQUIS) 5 MG TABS tablet Take 1 tablet (5 mg total) by mouth 2 (two) times daily. 60 tablet 11   atorvastatin (LIPITOR) 80 MG tablet TAKE 1 TABLET (80 MG TOTAL) BY MOUTH DAILY. 90 tablet 3   busPIRone (BUSPAR) 5 MG tablet TAKE 1 TABLET BY MOUTH TWICE A DAY 120 tablet 0   clopidogrel (PLAVIX) 75 MG tablet Take 1 tablet (75 mg total) by mouth daily. 90 tablet 3   Continuous Blood Gluc Sensor (DEXCOM G6 SENSOR) MISC 1 Device by Does not apply route as directed. 3 each 11   Continuous Blood Gluc Transmit (DEXCOM G6 TRANSMITTER) MISC 1 Device by Does not apply route as directed. 1 each 3   dapagliflozin propanediol (FARXIGA) 10 MG TABS tablet Take 1 tablet (10 mg total) by mouth daily. 30 tablet 6   ezetimibe (ZETIA) 10 MG tablet Take 1 tablet (10 mg total) by mouth daily. 90 tablet 3   famotidine (PEPCID) 10 MG tablet Take 1 tablet (10 mg total) by mouth 2 (two) times daily. 180 tablet 3   furosemide (LASIX) 40 MG tablet Take 1 tablet (40 mg total) by mouth daily. 90 tablet 3   guaiFENesin (MUCINEX) 600 MG 12 hr tablet Take 1,200 mg by mouth 2 (two) times daily as needed for to loosen phlegm.     insulin aspart (NOVOLOG) 100 UNIT/ML FlexPen Inject 10 Units into the skin 3 (three) times daily with meals. 30 mL 3   Insulin Glargine (BASAGLAR KWIKPEN) 100 UNIT/ML Inject 25 Units into the skin 2 (two) times daily. 45 mL 3   Insulin Pen Needle 32G  X 4 MM MISC USE AS DIRECTED IN THE MORNING, AT NOON, IN THE EVENING, AND AT BEDTIME. 400 each 0   isosorbide mononitrate (IMDUR) 30 MG 24 hr tablet Take 1 tablet (30 mg total) by mouth daily. 90 tablet 3   metFORMIN (GLUCOPHAGE) 1000 MG tablet Take 1 tablet (1,000 mg total) by mouth daily. 90 tablet 3   metoprolol succinate (TOPROL XL) 100 MG 24 hr tablet Take 1 tablet (100 mg total) by mouth daily. Take with or immediately following a meal. 90 tablet 3   nitroGLYCERIN (NITROSTAT) 0.4 MG SL tablet PLACE 1 TABLET (0.4 MG TOTAL) UNDER THE TONGUE EVERY 5 (FIVE) MINUTES X 3 DOSES AS NEEDED FOR CHEST PAIN. (Patient taking differently: Place 0.4 mg  under the tongue every 5 (five) minutes x 3 doses as needed for chest pain.) 25 tablet 3   potassium chloride SA (KLOR-CON M) 20 MEQ tablet Take 1 tablet (20 mEq total) by mouth daily. 30 tablet 6   sacubitril-valsartan (ENTRESTO) 24-26 MG Take 1 tablet by mouth 2 (two) times daily. 60 tablet 0   spironolactone (ALDACTONE) 25 MG tablet Take 1/2 tablet (12.5 mg total) by mouth daily. 45 tablet 3   traZODone (DESYREL) 50 MG tablet Take 0.5-1 tablets (25-50 mg total) by mouth at bedtime as needed for sleep. 90 tablet 1   No current facility-administered medications for this encounter.    Allergies  Allergen Reactions   Penicillins Itching      Social History   Socioeconomic History   Marital status: Single    Spouse name: Not on file   Number of children: Not on file   Years of education: Not on file   Highest education level: Not on file  Occupational History   Not on file  Tobacco Use   Smoking status: Some Days    Packs/day: 1.00    Years: 0.50    Pack years: 0.50    Types: Cigarettes   Smokeless tobacco: Never  Vaping Use   Vaping Use: Never used  Substance and Sexual Activity   Alcohol use: Never   Drug use: Never   Sexual activity: Not on file  Other Topics Concern   Not on file  Social History Narrative   Not on file   Social  Determinants of Health   Financial Resource Strain: High Risk   Difficulty of Paying Living Expenses: Hard  Food Insecurity: Food Insecurity Present   Worried About Running Out of Food in the Last Year: Sometimes true   Ran Out of Food in the Last Year: Sometimes true  Transportation Needs: No Transportation Needs   Lack of Transportation (Medical): No   Lack of Transportation (Non-Medical): No  Physical Activity: Not on file  Stress: Not on file  Social Connections: Not on file  Intimate Partner Violence: Not on file      Family History  Problem Relation Age of Onset   Other Neg Hx     Vitals:   05/18/21 1010  BP: (!) 140/92  Pulse: 100  SpO2: 96%  Weight: 93.9 kg    PHYSICAL EXAM: General:  No distress. Ambulated into clinic. HEENT: normal Neck: supple. no JVD. Carotids 2+ bilat; no bruits.  Cor: PMI nondisplaced. Regular rate & rhythm. No rubs, gallops or murmurs. Lungs: clear Abdomen: soft, nontender, nondistended. No hepatosplenomegaly.  Extremities: no cyanosis, clubbing, rash, edema Neuro: alert & oriented x 3, cranial nerves grossly intact. moves all 4 extremities w/o difficulty. Affect pleasant.  ECG: Sinus tachycardia 106 bpm, nonspecific ST-T wave changes inferolateral leads   ASSESSMENT & PLAN:  Chronic systolic CHF/ICM: - EF 35 to 40% on TTE 07/06/2019.   - Echo 03/25/2020: EF 45 to 50%, grade 1 diastolic dysfunction, normal RV function, no significant valvular disease.  - Echo, 05/05/21: EF 45-50%, RWMA, RV okay, trivial MR - Recent admission for a/c CHF in setting of AF with RVR. Now back in SR. - NYHA III. Confounded by deconditioning.  - Volume appears stable. Continue 40 mg furosemide daily.  - Continue Farxiga 10 mg daily - Continue spiro 12.5 mg daily - Continue metoprolol XL 100 mg daily - Switch losartan to Entresto 24/26 mg BID - BMET/BNP today, BMET again at f/u with Cardiology  2. Paroxysmal atrial fibrillation: - Diagnosed during  recent admission. Converted with cardizem gtt. SR today.  - Continue metoprolol xl 100 mg daily - May need AAD if has recurrence - CHADS2-VASc score = 3(HTN, CAD, DM), on Eliquis 5 mg BID - CBC today. Denies bleeding.  3. CAD: - Has severe multivessel CAD. No targets for intervention on LHC in 04/21. - HS troponin up to 2880 during recent admit. Felt to be secondary to demand ischemia in setting of AF with RVR and a/c CHF. Medical management recommended. -Has chronic intermittent CP. Symptoms difficult to discern. Not clearly predictable with exertion. He is on imdur 30 mg daily + metoprolol xl 100 mg daily. Need to optimize blood pressure management. Switching losartan to entresto as above. Can further titrate antianginals at next f/u if having symptoms. - Continue Plavix. - On Zetia and Atorvastatin 80 mg daily.  4. DM II: - Not controlled. A1c 9.4% in 02/23. - Insulin dependent.  - Now on Farxiga  5. GERD: - On famotidine - Does not feel it is helping. Recommended f/u with PCP. May need GI workup. He is a smoker.  6. Hx syncope: - Resulted in humeral fracture in 2022.  - NSVT X 6 seconds on monitor in 03/22. Ventricular arrhythmias a concern d/t CAD and hx CHF. He declined loop recorder to monitor VT burden.  - Denies recurrent syncope  7. Tobacco use: - Cessation discussed. Still smoking 3-4 cigarettes a day - Declined smoking cessation aids at f/u with PCP yesterday.   NYHA III GDMT  Diuretic-Furosemide BB-Metoprolol XL Ace/ARB/ARNI-Entresto MRA-Spiro SGLT2i-Farxiga    Referred to HFSW (PCP, Medications, Transportation, ETOH Abuse, Drug Abuse, Insurance, Financial ): Yes  Refer to Pharmacy: No Refer to Home Health: No Refer to Advanced Heart Failure Clinic: No  Refer to General Cardiology: Yes  Follow up: with Dr. Gardiner Rhyme in 2-3 weeks

## 2021-05-17 NOTE — Progress Notes (Signed)
[facility]   Subjective:   Larry Lewis is a 56 y.o. male presents for hospital follow up. Patient friend Julien Nordmann is his [place] interpreter who also has permission from patient to be at his appt.) took him to the ED for racing heart , chest pain and radiating pain from neck down to left arm. Admit date to the hospital was 05/05/21, patient was discharged from the hospital on 05/06/21, patient was admitted for: Acute on chronic heart failure. He has an appt with cardiology tomorrow. Today he states "I am ok". Denies shortness of breath, headaches, chest pain or lower extremity edema .   Past Medical History:  Diagnosis Date   AMI (acute myocardial infarction) ([place])    Anxiety    Asthma    CHF (congestive heart failure) ([place])    Coronary artery disease    Fall at home, initial encounter 03/24/2020   High cholesterol    Hypertension      Allergies  Allergen Reactions   Penicillins Itching      Current Outpatient Medications on File Prior to Visit  Medication Sig Dispense Refill   acetaminophen (TYLENOL) 325 MG tablet Take 2 tablets (650 mg total) by mouth every 4 (four) hours as needed for headache or mild pain.     apixaban (ELIQUIS) 5 MG TABS tablet Take 1 tablet (5 mg total) by mouth 2 (two) times daily. 60 tablet 11   atorvastatin (LIPITOR) 80 MG tablet TAKE 1 TABLET (80 MG TOTAL) BY MOUTH DAILY. (Patient taking differently: Take 80 mg by mouth daily.) 90 tablet 3   busPIRone (BUSPAR) 5 MG tablet TAKE 1 TABLET BY MOUTH TWICE A DAY (Patient taking differently: Take 5 mg by mouth 2 (two) times daily.) 120 tablet 0   clopidogrel (PLAVIX) 75 MG tablet Take 1 tablet (75 mg total) by mouth daily. 90 tablet 3   Continuous Blood Gluc Sensor (DEXCOM G6 SENSOR) MISC 1 Device by Does not apply route as directed. 3 each 11   Continuous Blood Gluc Transmit (DEXCOM G6 TRANSMITTER) MISC 1 Device by Does not apply route as directed. 1 each 3   dapagliflozin propanediol (FARXIGA)  10 MG TABS tablet Take 1 tablet (10 mg total) by mouth daily. 30 tablet 6   ezetimibe (ZETIA) 10 MG tablet Take 1 tablet (10 mg total) by mouth daily. 90 tablet 3   famotidine (PEPCID) 10 MG tablet Take 1 tablet (10 mg total) by mouth 2 (two) times daily. 180 tablet 3   furosemide (LASIX) 40 MG tablet Take 1 tablet (40 mg total) by mouth daily. 90 tablet 3   guaiFENesin (MUCINEX) 600 MG 12 hr tablet Take 1,200 mg by mouth 2 (two) times daily as needed for to loosen phlegm.     insulin aspart (NOVOLOG) 100 UNIT/ML FlexPen Inject 10 Units into the skin 3 (three) times daily with meals. 30 mL 3   Insulin Glargine (BASAGLAR KWIKPEN) 100 UNIT/ML Inject 25 Units into the skin 2 (two) times daily. 45 mL 3   Insulin Pen Needle 32G X 4 MM MISC USE AS DIRECTED IN THE MORNING, AT NOON, IN THE EVENING, AND AT BEDTIME. 400 each 0   isosorbide mononitrate (IMDUR) 30 MG 24 hr tablet Take 1 tablet (30 mg total) by mouth daily. 90 tablet 3   losartan (COZAAR) 25 MG tablet Take 1 tablet (25 mg total) by mouth daily. 90 tablet 3   metFORMIN (GLUCOPHAGE) 1000 MG tablet Take 1 tablet (1,000 mg total) by mouth daily.  90 tablet 3   metoprolol succinate (TOPROL XL) 100 MG 24 hr tablet Take 1 tablet (100 mg total) by mouth daily. Take with or immediately following a meal. 90 tablet 3   potassium chloride SA (KLOR-CON M) 20 MEQ tablet Take 1 tablet (20 mEq total) by mouth daily. 30 tablet 6   spironolactone (ALDACTONE) 25 MG tablet Take 1/2 tablet (12.5 mg total) by mouth daily. 45 tablet 3   traZODone (DESYREL) 50 MG tablet Take 0.5-1 tablets (25-50 mg total) by mouth at bedtime as needed for sleep. 90 tablet 1   nitroGLYCERIN (NITROSTAT) 0.4 MG SL tablet PLACE 1 TABLET (0.4 MG TOTAL) UNDER THE TONGUE EVERY 5 (FIVE) MINUTES X 3 DOSES AS NEEDED FOR CHEST PAIN. (Patient taking differently: Place 0.4 mg under the tongue every 5 (five) minutes x 3 doses as needed for chest pain.) 25 tablet 3   No current facility-administered  medications on file prior to visit.     Review of System: Comprehensive ROS Pertinent positive and negative noted in HPI    Objective:  BP 136/81 (BP Location: Right Arm, Patient Position: Sitting, Cuff Size: Normal)    Pulse 90    Temp 97.8 F (36.6 C) (Oral)    Ht 5\' 9"  (1.753 m)    Wt 210 lb 9.6 oz (95.5 kg)    SpO2 95%    BMI 31.10 kg/m   Filed Weights   05/17/21 1019  Weight: 210 lb 9.6 oz (95.5 kg)    Physical Exam: General Appearance: Well nourished, in no apparent distress. Eyes: PERRLA, EOMs, conjunctiva no swelling or erythema Sinuses: No Frontal/maxillary tenderness ENT/Mouth: Ext aud canals clear, TMs without erythema, bulging. No erythema, swelling, or exudate on post pharynx.  Tonsils not swollen or erythematous. Hearing normal.  Neck: Supple, thyroid normal.  Respiratory: Respiratory effort normal, BS equal bilaterally without rales, rhonchi, wheezing or stridor.  Cardio: RRR with no MRGs. Brisk peripheral pulses without edema.  Abdomen: Soft, + BS.  Non tender, no guarding, rebound, hernias, masses. Lymphatics: Non tender without lymphadenopathy.  Musculoskeletal: Full ROM, 5/5 strength, normal gait.  Skin: Warm, dry without rashes, lesions, ecchymosis.  Neuro: Cranial nerves intact. Normal muscle tone, no cerebellar symptoms. Sensation intact.  Psych: Awake and oriented X 3, normal affect, Insight and Judgment appropriate.    Assessment:  Depaul was seen today for hospitalization follow-up.  Diagnoses and all orders for this visit:  Tobacco abuse - I have recommended complete cessation of tobacco use. I have discussed various options available for assistance with tobacco cessation including over the counter methods (Nicotine gum, patch and lozenges). We also discussed prescription options (Chantix, Nicotine Inhaler / Nasal Spray). The patient is not interested in pursuing any prescription tobacco cessation options at this time. - Patient declines at this time.    Uncontrolled type 2 diabetes mellitus with hyperglycemia, with long-term current use of insulin (HCC) A1C 9.2  Discussed  co- morbidities with uncontrol diabetes  Complications -diabetic retinopathy, (close your eyes ? What do you see nothing) nephropathy decrease in kidney function- can lead to dialysis-on a machine 3 days a week to filter your kidney, neuropathy- numbness and tinging in your hands and feet,  increase risk of heart attack and stroke, and amputation due to decrease wound healing and circulation. Decrease your risk by taking medication daily as prescribed, monitor carbohydrates- foods that are high in carbohydrates are the following rice, potatoes, breads, sugars, and pastas.  Reduction in the intake (eating) will assist in  lowering your blood sugars. Exercise daily at least 30 minutes daily.  Schedule appt with endocrinology   Coronary artery disease involving native coronary artery of native heart without angina pectoris F/u with cardiology tomorrow as scheduled   Generalized anxiety disorder Currently on buspar not helping and trazodone for sleep  [place] Office Visit from 05/17/2021 in [place]  PHQ-9 Total Score 24       -     Ambulatory referral to [facility] discharge follow-up retrieved from Discharge summary  Go to [name], NP (Internal Medicine) on 05/31/2021; @10 :10am Follow up with Donato Heinz, MD (Cardiology)  This note has been created with Dragon speech recognition software and smart phrase technology. Any transcriptional errors are unintentional.   [name], NP 05/17/2021, 10:37 AM

## 2021-05-18 ENCOUNTER — Telehealth (HOSPITAL_COMMUNITY): Payer: Self-pay

## 2021-05-18 ENCOUNTER — Other Ambulatory Visit (HOSPITAL_COMMUNITY): Payer: Self-pay

## 2021-05-18 ENCOUNTER — Ambulatory Visit (HOSPITAL_COMMUNITY)
Admit: 2021-05-18 | Discharge: 2021-05-18 | Disposition: A | Discharge status: Home / Self Care | Payer: Medicaid Other | Attending: Physician Assistant | Admitting: Physician Assistant

## 2021-05-18 ENCOUNTER — Encounter (HOSPITAL_COMMUNITY): Payer: Self-pay

## 2021-05-18 VITALS — BP 140/92 | HR 100 | Wt 207.0 lb

## 2021-05-18 DIAGNOSIS — R0789 Other chest pain: Secondary | ICD-10-CM | POA: Insufficient documentation

## 2021-05-18 DIAGNOSIS — K219 Gastro-esophageal reflux disease without esophagitis: Secondary | ICD-10-CM | POA: Insufficient documentation

## 2021-05-18 DIAGNOSIS — Z599 Problem related to housing and economic circumstances, unspecified: Secondary | ICD-10-CM | POA: Diagnosis not present

## 2021-05-18 DIAGNOSIS — I2510 Atherosclerotic heart disease of native coronary artery without angina pectoris: Secondary | ICD-10-CM | POA: Insufficient documentation

## 2021-05-18 DIAGNOSIS — Z733 Stress, not elsewhere classified: Secondary | ICD-10-CM | POA: Insufficient documentation

## 2021-05-18 DIAGNOSIS — I350 Nonrheumatic aortic (valve) stenosis: Secondary | ICD-10-CM | POA: Diagnosis not present

## 2021-05-18 DIAGNOSIS — Z720 Tobacco use: Secondary | ICD-10-CM | POA: Diagnosis not present

## 2021-05-18 DIAGNOSIS — Z794 Long term (current) use of insulin: Secondary | ICD-10-CM | POA: Insufficient documentation

## 2021-05-18 DIAGNOSIS — I25118 Atherosclerotic heart disease of native coronary artery with other forms of angina pectoris: Secondary | ICD-10-CM | POA: Diagnosis not present

## 2021-05-18 DIAGNOSIS — I480 Paroxysmal atrial fibrillation: Secondary | ICD-10-CM | POA: Diagnosis not present

## 2021-05-18 DIAGNOSIS — Z5986 Financial insecurity: Secondary | ICD-10-CM | POA: Diagnosis not present

## 2021-05-18 DIAGNOSIS — Z6379 Other stressful life events affecting family and household: Secondary | ICD-10-CM | POA: Insufficient documentation

## 2021-05-18 DIAGNOSIS — I110 Hypertensive heart disease with heart failure: Secondary | ICD-10-CM | POA: Insufficient documentation

## 2021-05-18 DIAGNOSIS — I5022 Chronic systolic (congestive) heart failure: Secondary | ICD-10-CM | POA: Insufficient documentation

## 2021-05-18 DIAGNOSIS — Z87898 Personal history of other specified conditions: Secondary | ICD-10-CM

## 2021-05-18 DIAGNOSIS — Z596 Low income: Secondary | ICD-10-CM | POA: Insufficient documentation

## 2021-05-18 DIAGNOSIS — F17210 Nicotine dependence, cigarettes, uncomplicated: Secondary | ICD-10-CM | POA: Diagnosis not present

## 2021-05-18 DIAGNOSIS — Z5941 Food insecurity: Secondary | ICD-10-CM | POA: Insufficient documentation

## 2021-05-18 DIAGNOSIS — Z7902 Long term (current) use of antithrombotics/antiplatelets: Secondary | ICD-10-CM | POA: Insufficient documentation

## 2021-05-18 DIAGNOSIS — Z6830 Body mass index (BMI) 30.0-30.9, adult: Secondary | ICD-10-CM | POA: Insufficient documentation

## 2021-05-18 DIAGNOSIS — Z79899 Other long term (current) drug therapy: Secondary | ICD-10-CM | POA: Diagnosis not present

## 2021-05-18 DIAGNOSIS — Z716 Tobacco abuse counseling: Secondary | ICD-10-CM | POA: Insufficient documentation

## 2021-05-18 DIAGNOSIS — Z7984 Long term (current) use of oral hypoglycemic drugs: Secondary | ICD-10-CM | POA: Diagnosis not present

## 2021-05-18 DIAGNOSIS — E119 Type 2 diabetes mellitus without complications: Secondary | ICD-10-CM | POA: Diagnosis not present

## 2021-05-18 LAB — BASIC METABOLIC PANEL
Anion gap: 11 (ref 5–15)
BUN: 14 mg/dL (ref 6–20)
CO2: 22 mmol/L (ref 22–32)
Calcium: 10 mg/dL (ref 8.9–10.3)
Chloride: 101 mmol/L (ref 98–111)
Creatinine, Ser: 1.06 mg/dL (ref 0.61–1.24)
GFR, Estimated: >60 mL/min (ref >60)
Glucose, Bld: 256 mg/dL — ABNORMAL HIGH (ref 70–99)
Potassium: 4.2 mmol/L (ref 3.5–5.1)
Sodium: 134 mmol/L — ABNORMAL LOW (ref 135–145)

## 2021-05-18 LAB — CBC
HCT: 47.7 % (ref 39.0–52.0)
Hemoglobin: 15.5 g/dL (ref 13.0–17.0)
MCH: 24.8 pg — ABNORMAL LOW (ref 26.0–34.0)
MCHC: 32.5 g/dL (ref 30.0–36.0)
MCV: 76.3 fL — ABNORMAL LOW (ref 80.0–100.0)
Platelets: 322 10*3/uL (ref 150–400)
RBC: 6.25 MIL/uL — ABNORMAL HIGH (ref 4.22–5.81)
RDW: 13.9 % (ref 11.5–15.5)
WBC: 10 10*3/uL (ref 4.0–10.5)
nRBC: 0 % (ref 0.0–0.2)

## 2021-05-18 LAB — BRAIN NATRIURETIC PEPTIDE: B Natriuretic Peptide: 36.6 pg/mL (ref 0.0–100.0)

## 2021-05-18 MED ORDER — ENTRESTO 24-26 MG PO TABS: 1.0000 | ORAL_TABLET | Freq: Two times a day (BID) | ORAL | 0 refills | Status: DC

## 2021-05-18 NOTE — Telephone Encounter (Signed)
Heart Failure Patient Advocate Encounter   Received notification from [facility] Medicaid [facility]) that prior authorization for Larry Lewis is required.   PA submitted on CoverMyMeds Key [id number] Status is pending   Will continue to follow.  Sharen Hones, PharmD, BCPS Heart Failure Stewardship Pharmacist Phone [phone]

## 2021-05-18 NOTE — Progress Notes (Signed)
Heart and Vascular Care Navigation  05/18/2021  Larry Lewis 1966/01/12 [id number]  Reason for Referral: Patient seen in HF [facility]   Engaged with patient face to face for initial visit for Heart and Vascular Care Coordination.                                                                                                   Assessment:  Patient is a 56 yo male who is currently living with a friend [name] in a single family home. Patient reports he was born in [place] and has lived back and forth between [place] and [place] over the years. Patient reports he has not worked in 12 years and was financially supported   by his parents until their death in 2019-07-30. Patient reports he has a pending disability and unsure of the current status of the application. Patient has medicaid and food stamps. He states that his food stamps runs out before the end of the month.   Patient shared about his anxiety and that he has current medications. He was seen by [facility]  yesterday and the provider will refer for mental health services.                             HRT/VAS Care Coordination     Patients Home Cardiology Office Heart Failure Clinic  HF [facility]   Outpatient Care Team Social Worker   Social Worker Name: Lasandra Beech, [place] [phone]   Living arrangements for the past 2 months Single Family Home   Lives with: Friends  [name]   Patient Current Insurance Coverage Medicaid   Patient Has Concern With Paying Medical Bills No   Patient Concerns With Medical Bills previous medical bills, ongoing care needs   Medical Bill Referrals: CAFA/Medicaid applications   Does Patient Have Prescription Coverage? Yes   Patient Prescription Assistance Programs [facility]   Home Assistive Devices/Equipment Scales   DME Agency NA       Social History:                                                                             SDOH Screenings   Alcohol Screen: Not on file  Depression (PHQ2-9): Medium Risk   PHQ-2  Score: 24  Financial Resource Strain: High Risk   Difficulty of Paying Living Expenses: Hard  Food Insecurity: Food Insecurity Present   Worried About Programme researcher, broadcasting/film/video in the Last Year: Sometimes true   Ran Out of Food in the Last Year: Sometimes true  Housing: Low Risk    Last Housing Risk Score: 0  Physical Activity: Not on file  Social Connections: Not on file  Stress: Not on file  Tobacco Use: High Risk   Smoking Tobacco Use: Some  Days   Smokeless Tobacco Use: Never   Passive Exposure: Not on file  Transportation Needs: No Transportation Needs   Lack of Transportation (Medical): No   Lack of Transportation (Non-Medical): No    SDOH Interventions: Financial Resources:  Financial Strain Interventions: Other (Comment) (Pending Disability application wiht SSA.) CSW provided process for follow up on pending application  Food Insecurity:  Food Insecurity Interventions: Other (Comment) (Patient has food stamps but states it doesn't last the whole month)  Housing Insecurity:  Housing Interventions: Intervention Not Indicated  Transportation:   Transportation Interventions: Intervention Not Indicated   Follow-up plan:  CSW discussed follow up on pending disability with DDS. Patient and friend will also follow up to request increase in food stamps although CSW shared unclear if that would be possible. Patient and friend verbalize understanding of follow up and deny any concerns at this time. CSW available as needed. Lasandra Beech, LCSW, CCSW-MCS [phone]

## 2021-05-18 NOTE — Telephone Encounter (Signed)
Called to confirm Heart & Vascular Transitions of Care appointment at 10AM today, 2/28. Patient reminded to bring all medications and pill box organizer with them. Confirmed patient has transportation. Gave directions, instructed to utilize valet parking.  Confirmed appointment prior to ending call.   Ozella Rocks, MSN, RN Heart Failure Nurse Navigator [phone]

## 2021-05-18 NOTE — Patient Instructions (Signed)
Stop Losartan  Start Entresto 24/26 mg Twice daily   Labs done today, your results will be available in MyChart, we will contact you for abnormal readings.  Thank you for allowing [place] to provider your heart failure care after your recent hospitalization. Please follow-up with [facility] (Dr Gardiner Rhyme) in a couple of weeks, his office will call you to schedule this appointment  Do the following things EVERYDAY: Weigh yourself in the morning before breakfast. Write it down and keep it in a log. Take your medicines as prescribed Eat low salt foods--Limit salt (sodium) to 2000 mg per day.  Stay as active as you can everyday Limit all fluids for the day to less than 2 liters

## 2021-05-19 ENCOUNTER — Telehealth (HOSPITAL_COMMUNITY): Payer: Self-pay

## 2021-05-19 NOTE — Progress Notes (Signed)
Cardiology Office Note:    Date:  05/28/2021   ID:  Larry Lewis, DOB 1965-12-05, MRN [id number]  PCP:  Larry Perna, NP  Cardiologist:  Larry Heinz, MD  Electrophysiologist:  None   Referring MD: Larry Perna, NP    Chief Complaint: follow-up of CAD and CHF  History of Present Illness:    Larry Lewis is a 56 y.o. male with a history of severe multivessel CAD s/p 2 stents in 2007 in [place], [place], chronic combined CHF with EF of 45-50% on recent Echo in 04/2021, paroxysmal atrial fibrillation on Eliquis, hypertension, hyperlipidemia, type 2 diabetes mellitus, asthma, and tobacco abuse who is followed by Dr. Nechama Lewis and presents today for follow-up of CAD and CHF.  Patient has a long history of CAD and had 2 stents placed in 2007 in [place], [place].  He was not seen again by Cardiology after this until he was admitted at [facility] in 06/2019 with acute CHF.  Echo showed LVEF of 35-40% with hypokinesis of the anterior wall, antero-lateral wall, anterior septum, entire inferior wall, posterior wall, mid inferior septal segment, and basal inferior septal segment.  Right/left cardiac catheterization showed severe diffuse multivessel CAD (see report below). Most vessels were not amenable to intervention. Medical therapy was recommended at that time. He was admitted in 03/2020 after presenting with possible syncope and a fall leading to a humerus fracture. Orthostatics were negative. Echo showed LVEF of 45-50% with grade 1 diastolic dysfunction. Normal RV function. He declined surgery for his fracture. Outpatient monitor was ordered and showed short run of NSVT but no other significant arrhythmias. Patient was last seen by Dr. Gardiner Lewis in 03/2021 at which time he was doing well from a cardiac standpoint. He had recently returned from [place] where his family lives. While there, he did not have all his medications and had to take sublingual Nitro 3-4 times. However, since being back in the  [place] and back on all his medications, he had not had to take any Nitro.  Since last visit, patient was recently admitted from 05/05/2021 to 05/06/2021 for acute on chronic CHF and new onset atrial fibrillation with RVR. Repeat Echo showed LVEF of 45-50% with akinesis of the basal-mid inferior and inferolateral walls and grade 1 diastolic dysfunction as well as mild AS. He was diuresed with IV Lasix. He was started on IV Cardizem and Eliquis and converted to normal sinus rhythm. High-sensitivity troponin was elevated and peaked at 2,880. This was felt to be due to demand ischemia secondary to rapid atrial fibrillation and acute CHF with known underlying severe CAD. PO Lasix was increased to 40mg  daily at discharge and he was started on Farxiga. Aspirin was stopped now given the addition of Eliquis. Discharge weight was 207.8 lbs.  Patient was seen in the [facility] on 05/18/2021 at which time he was still having some dyspnea on exertion but improved from recent hospitalization. He continued to report intermittent left sided chest pain that occurs randomly. This is chronic. Losartan was stopped and he was switched to [facility].   Patient presents today for follow-up.  Here with his cousin.  Patient speaks Arabic. He is able to speak some English but his cousin helped interpret.  I offered an interpreter but patient declined and wanted to use his cousin instead.  Patient is somewhat of a difficult historian.  He continues to have some dyspnea and states it waxes and wanes but overall "okay."  He does describe some orthopnea and  PND over the last 4-5 days though.  No lower extremity edema.  Weight is down 4 pounds from last office visit. It does not sound like he is following a low sodium diet at home. He also reports some left-sided chest/neck tightness with some left hand numbness and headache that occurs after he gets acutely short of breath.  He denies any chest/neck pain outside of when he is short of breath.   He also describes some reflux symptoms.  He states this feels very different than his symptoms prior to his PCI's in 2007.  No palpitations or significant lightheadedness/dizziness.  No syncope.  No abnormal bleeding on Plavix and Eliquis.  Cousin does state he has extreme anxiety and states current medications are not helping.  He reportedly recently saw his PCP who adjusted the dose of his medications.  This may be contributing to his symptoms as well.  He does continue to smoke but states he is down to 4-5 cigarettes a day.  Patient brought all of his medicines to the office today which was very helpful.  He was prescribed Spironolactone 12.5 mg daily.  He has 2 bottles of the 25mg  tablets at home and has been taking a 1/2 tablet from each bottle for a total of 25mg  daily. He is tolerated this well so will continue this dose. We consolidated these bottles today for simplicity.  He states he is still taking Losartan because he received a letter from his insurance company that they would not cover the [facility] because his EF is not less than 40%. I will have our office work on getting this approved.  Past Medical History:  Diagnosis Date   Anxiety    Asthma    Chronic combined systolic and diastolic CHF (congestive heart failure) (HCC)    mildly reduced EF of 45-50% on Echo in 04/2021   Coronary artery disease    s/p 2 stents to RCA in 2007 in [place], [place]   Fall at home, initial encounter 03/24/2020   Hyperlipidemia    Hypertension    Paroxysmal atrial fibrillation ([place])    Tobacco abuse    Type 2 diabetes mellitus ([place])     Past Surgical History:  Procedure Laterality Date   CARDIAC CATHETERIZATION     RIGHT/LEFT HEART CATH AND CORONARY ANGIOGRAPHY N/A 07/09/2019   Procedure: RIGHT/LEFT HEART CATH AND CORONARY ANGIOGRAPHY;  Surgeon: Troy Sine, MD;  Location: [facility];  Service: Cardiovascular;  Laterality: N/A;    Current Medications: Current Meds  Medication Sig    acetaminophen (TYLENOL) 325 MG tablet Take 2 tablets (650 mg total) by mouth every 4 (four) hours as needed for headache or mild pain.   apixaban (ELIQUIS) 5 MG TABS tablet Take 1 tablet (5 mg total) by mouth 2 (two) times daily.   atorvastatin (LIPITOR) 80 MG tablet TAKE 1 TABLET (80 MG TOTAL) BY MOUTH DAILY.   busPIRone (BUSPAR) 5 MG tablet TAKE 1 TABLET BY MOUTH TWICE A DAY   clopidogrel (PLAVIX) 75 MG tablet Take 1 tablet (75 mg total) by mouth daily.   Continuous Blood Gluc Sensor (DEXCOM G6 SENSOR) MISC 1 Device by Does not apply route as directed.   Continuous Blood Gluc Transmit (DEXCOM G6 TRANSMITTER) MISC 1 Device by Does not apply route as directed.   dapagliflozin propanediol (FARXIGA) 10 MG TABS tablet Take 1 tablet (10 mg total) by mouth daily.   famotidine (PEPCID) 10 MG tablet Take 1 tablet (10 mg total) by mouth 2 (  two) times daily.   furosemide (LASIX) 40 MG tablet Take 1 tablet (40 mg total) by mouth daily.   guaiFENesin (MUCINEX) 600 MG 12 hr tablet Take 1,200 mg by mouth 2 (two) times daily as needed for to loosen phlegm.   insulin aspart (NOVOLOG) 100 UNIT/ML FlexPen Inject 10 Units into the skin 3 (three) times daily with meals.   Insulin Glargine (BASAGLAR KWIKPEN) 100 UNIT/ML Inject 25 Units into the skin 2 (two) times daily.   Insulin Pen Needle 32G X 4 MM MISC USE AS DIRECTED IN THE MORNING, AT NOON, IN THE EVENING, AND AT BEDTIME.   isosorbide mononitrate (IMDUR) 60 MG 24 hr tablet Take 1 tablet (60 mg total) by mouth daily.   metFORMIN (GLUCOPHAGE) 1000 MG tablet Take 1 tablet (1,000 mg total) by mouth daily.   metoprolol succinate (TOPROL XL) 100 MG 24 hr tablet Take 1 tablet (100 mg total) by mouth daily. Take with or immediately following a meal.   nitroGLYCERIN (NITROSTAT) 0.4 MG SL tablet PLACE 1 TABLET (0.4 MG TOTAL) UNDER THE TONGUE EVERY 5 (FIVE) MINUTES X 3 DOSES AS NEEDED FOR CHEST PAIN. (Patient taking differently: Place 0.4 mg under the tongue every 5 (five)  minutes x 3 doses as needed for chest pain.)   sacubitril-valsartan (ENTRESTO) 24-26 MG Take 1 tablet by mouth 2 (two) times daily.   spironolactone (ALDACTONE) 25 MG tablet Take 1 tablet (25 mg total) by mouth daily.   traZODone (DESYREL) 50 MG tablet Take 0.5-1 tablets (25-50 mg total) by mouth at bedtime as needed for sleep.   [DISCONTINUED] ezetimibe (ZETIA) 10 MG tablet Take 1 tablet (10 mg total) by mouth daily.   [DISCONTINUED] isosorbide mononitrate (IMDUR) 30 MG 24 hr tablet Take 1 tablet (30 mg total) by mouth daily.   [DISCONTINUED] potassium chloride SA (KLOR-CON M) 20 MEQ tablet Take 1 tablet (20 mEq total) by mouth daily.   [DISCONTINUED] spironolactone (ALDACTONE) 25 MG tablet Take 1/2 tablet (12.5 mg total) by mouth daily.     Allergies:   Penicillins   Social History   Socioeconomic History   Marital status: Single    Spouse name: Not on file   Number of children: Not on file   Years of education: Not on file   Highest education level: Not on file  Occupational History   Not on file  Tobacco Use   Smoking status: Some Days    Packs/day: 1.00    Years: 0.50    Pack years: 0.50    Types: Cigarettes   Smokeless tobacco: Never  Vaping Use   Vaping Use: Never used  Substance and Sexual Activity   Alcohol use: Never   Drug use: Never   Sexual activity: Not on file  Other Topics Concern   Not on file  Social History Narrative   Not on file   Social Determinants of Health   Financial Resource Strain: High Risk   Difficulty of Paying Living Expenses: Hard  Food Insecurity: Food Insecurity Present   Worried About Running Out of Food in the Last Year: Sometimes true   Ran Out of Food in the Last Year: Sometimes true  Transportation Needs: No Transportation Needs   Lack of Transportation (Medical): No   Lack of Transportation (Non-Medical): No  Physical Activity: Not on file  Stress: Not on file  Social Connections: Not on file     Family History: The  patient's family history is negative for Other.  ROS:   Please see  the history of present illness.     EKGs/Labs/Other Studies Reviewed:    The following studies were reviewed today:  Right/Left Cardiac 07/09/2019: Mid RCA to Dist RCA lesion is 100% stenosed. Acute Mrg lesion is 90% stenosed. Ost RCA to Prox RCA lesion is 70% stenosed. Prox RCA to Mid RCA lesion is 90% stenosed. 1st Mrg lesion is 85% stenosed. 2nd Mrg-1 lesion is 100% stenosed. 2nd Mrg-2 lesion is 100% stenosed. 3rd Mrg lesion is 90% stenosed. Dist Cx lesion is 70% stenosed. Dist LAD lesion is 100% stenosed. 2nd Diag lesion is 95% stenosed. Mid LAD lesion is 50% stenosed. 1st Diag-2 lesion is 40% stenosed. 1st Diag-1 lesion is 80% stenosed. Mid Cx to Dist Cx lesion is 30% stenosed.   Severe diffuse multivessel CAD with moderate irregularity of the LAD with 80% proximal diagonal stenosis prior to a previously placed stent with intimal hyperplasia within the stented segment, 50% the stenosis, 95% stenosis in the second diagonal vessel and total occlusion of the apical LAD; left circumflex vessel with large OM1 vessel with 85% stenosis proximal to an aneurysmal segment, total occlusion of the OM 2 vessel with previously placed stent in this vessel and 90 and 70% bifurcation stenoses in the distal circumflex; severely diffusely diseased RCA with distal occlusion.  There is faint filling of a probable small PLA vessel via the left injection.   Normal right heart pressures.   LVEDP 16 mmHg.   Recommendations: Plan initiate medical therapy since at present he is not on any anti-ischemic medications.  Most vessels are not amenable to intervention; however if patient experiences increasing chest pain symptomatology the large OM1 vessel can potentially undergo intervention with stenting.  Aggressive lipid-lowering therapy with target LDL less than 70.  Optimal blood pressure control.  Smoking cessation is essential.  With  diffuse CAD consider DAPT therapy.  Diagnostic Dominance: Right  _______________  Event Monitor 04/21/2020 to 05/20/2020: Episode of NSVT lasting 6 seconds (15 beats)   Predominant rhythm is sinus rhythm. Range is 68-133 bpm with average of 92 bpm. No atrial fibrillation, sustained ventricular tachycardia, significant pause, or high degree AV block.  NSVT x6 seconds.  Total ectopy <1%. 5 patient triggered events, which corresponded to sinus rhythm. _______________  Echocardiogram 05/05/2021: Impressions: 1. Left ventricular ejection fraction, by estimation, is 45 to 50%. The  left ventricle has mildly decreased function. The left ventricle  demonstrates regional wall motion abnormalities (see scoring  diagram/findings for description). Left ventricular  diastolic parameters are consistent with Grade I diastolic dysfunction  (impaired relaxation). There is akinesis of the left ventricular,  basal-mid inferior wall and inferolateral wall.   2. Right ventricular systolic function is normal. The right ventricular  size is normal.   3. Left atrial size was mildly dilated.   4. The mitral valve is normal in structure. Trivial mitral valve  regurgitation. No evidence of mitral stenosis.   5. The aortic valve is tricuspid. There is moderate calcification of the  aortic valve. Aortic valve regurgitation is trivial. Mild aortic valve  stenosis. Aortic valve area, by VTI measures 1.76 cm. Aortic valve mean  gradient measures 11.0 mmHg. Aortic  valve Vmax measures 2.22 m/s.   6. The inferior vena cava is normal in size with greater than 50%  respiratory variability, suggesting right atrial pressure of 3 mmHg.   EKG:  EKG not ordered today.   Recent Labs: 04/06/2021: ALT 59 05/05/2021: TSH 1.311 05/06/2021: Magnesium 1.9 05/18/2021: B Natriuretic Peptide 36.6; BUN 14; Creatinine,  Ser 1.06; Hemoglobin 15.5; Platelets 322; Potassium 4.2; Sodium 134  Recent Lipid Panel    Component Value  Date/Time   CHOL 139 04/06/2021 1034   TRIG 247 (H) 04/06/2021 1034   HDL 34 (L) 04/06/2021 1034   CHOLHDL 4.1 04/06/2021 1034   CHOLHDL 8.1 07/07/2019 0336   VLDL 29 07/07/2019 0336   LDLCALC 65 04/06/2021 1034    Physical Exam:    Vital Signs: BP 122/78    Pulse 90    Ht 5\' 9"  (1.753 m)    Wt 206 lb (93.4 kg)    SpO2 99%    BMI 30.42 kg/m     Wt Readings from Last 3 Encounters:  05/28/21 206 lb (93.4 kg)  05/18/21 207 lb (93.9 kg)  05/17/21 210 lb 9.6 oz (95.5 kg)     General: 56 y.o. male in no acute distress. HEENT: Normocephalic and atraumatic. Sclera clear.  Neck: Supple. No JVD. Heart: RRR. Distinct S1 and S2. II/VI systolic murmur noted. No gallops or rubs. Radial pulses 2+ and equal bilaterally. Lungs: No increased work of breathing. Clear to ausculation bilaterally. No wheezes, rhonchi, or rales.  Abdomen: Soft, non-distended, and non-tender to palpation.  Extremities: No lower extremity edema.    Skin: Warm and dry. Neuro: Alert and oriented x3. No focal deficits. Psych: Normal affect. Responds appropriately.  Assessment:    1. Coronary artery disease involving native coronary artery of native heart without angina pectoris   2. Chronic combined systolic and diastolic congestive heart failure (HCC)   3. Paroxysmal atrial fibrillation ([place])   4. Primary hypertension   5. Hyperlipidemia, unspecified hyperlipidemia type   6. Type 2 diabetes mellitus with complication, with long-term current use of insulin ([place])   7. Tobacco abuse     Plan:    CAD S/p stenting to 1st Diag and OM2 in 2007 in [place], [place]. Last cath in 06/2019 showed severe diffuse multivessel CAD with occlusion of prior OM2 stent and 40% in-stent re-stenosis of 1st Diag stent. Most vessels were not amenable to intervention. Medical therapy was recommended but there is a large OM1 vessel that could potentially be intervened on in the future if he continues to have symptoms despite good anti-anginal  therapy.  - Patient does report some left sided neck/check pain after becoming acutely short of breath. No episodes outside of these times. He states this feels different than prior cardiac pain. - Current anti-anginals include Toprol-XL 100mg  daily and Imdur 30mg  daily. Will increase Imdur to 60mg  daily. Will also plan to increase his Toprol-XL to 200mg  daily for additional heart rate control when we call him back with his lab work from [facility] (did not have a chance to mention this before he left). - Continue Plavix 75mg  daily. - Continue Lipitor 80mg  daily and Zetia 10mg  daily. - It is difficult to tease out patient's symptoms partly due to language barrier (would recommend formal interpreter at next visit). Not entirely convinced that his symptoms are angina. Cousin reports he has very bad anxiety and this may be contributing. However, if he continues to report this chest pain with increase in Imdur, may need to consider repeat cath with intervention of OM1 vessel.  Advised patient to let us know if he has episodes of chest outside of his shortness of breath episodes.   Chronic Combined CHF  Initially diagnosed in 06/2019. LVEF 35-40% at that time. Patient recently admitted for acute on chronic combined CHF. Echo showed LVEF of 45-50% with akinesis of  the basal-mid inferior and inferolateral walls and grade 1 diastolic dysfunction as well as mild AS.  - He continues to have intermittent dyspnea and describes orthopnea/PND the past 4 days but weight is down. - Appears euvolemic on exam.  - Continue Lasix 40mg  daily.  - Losartan was stopped at last visit and he was switched to [facility] 24-26mg  twice daily. However, he received a letter in the mail that his insurance would not cover this due to EF not being <40%. Continue Losartan 25mg  daily for now - I will ask our office/pharmacy team for help on getting Entresto improved. - Continue Toprol-XL 100mg  daily. - Currently has Spironolactone 12.5mg  daily  prescribed. However, he has 2 bottles of this and has been taking two 1/2 tablets of 25mg  (one from each bottle) for total of 25mg  daily. We consolidated these bottles in clinic today and I advised patient that he can continue to take a full tablet (25mg  daily) given he is tolerating this well. Will send in updated prescription.  - Will increase Imdur to 60mg  daily as above. - Continue Farxiga 10mg  daily. - Continue daily weight and sodium/fluid restrictions. - Will check BNP and BMET today.  Paroxysmal Atrial Fibrillation Found to be in new onset atrial fibrillation during recent admission but converted back to sinus rhythm on Cardizem drip. - Maintaining sinus rhythm on exam. Rates in the 90s. - Continue Toprol-XL 100mg  daily. Will also plan to increase his Toprol-XL to 200mg  daily for additional heart rate control when we call him back with his lab work from [facility] (did not have a chance to mention this before he left). - CHA2DS2-VASc = 4 (CHF, CAD, HTN, DM). Continue chronic anticoagulation with Eliquis 5mg  twice daily.  Hypertension BP well controlled. - Continue current medications for CHF as above.  Hyperlipidemia Lipid panel in 03/2021: Total Cholesterol 139, Triglycerides 247, HDL 34, LDL 65.  - Continue Lipitor 80mg  daily and Zetia 10mg  daily.  Type 2 Diabetes Mellitus Hemoglobin A1c 9.4 during recent admission. - On Metformin, Farxiga, and Insulin - Management per PCP.  Tobacco Abuse Patient continues to smoke.  - Discussed importance of complete cessation.  Disposition: Patient already has follow-up scheduled with Dr. Gardiner Lewis next month.   Medication Adjustments/Labs and Tests Ordered: Current medicines are reviewed at length with the patient today.  Concerns regarding medicines are outlined above.  Orders Placed This Encounter  Procedures   Basic metabolic panel   Pro b natriuretic peptide (BNP)[id number])   Meds ordered this encounter   Medications   spironolactone (ALDACTONE) 25 MG tablet    Sig: Take 1 tablet (25 mg total) by mouth daily.    Dispense:  90 tablet    Refill:  3   ezetimibe (ZETIA) 10 MG tablet    Sig: Take 1 tablet (10 mg total) by mouth daily.    Dispense:  90 tablet    Refill:  3   potassium chloride SA (KLOR-CON M) 20 MEQ tablet    Sig: Take 1 tablet (20 mEq total) by mouth daily.    Dispense:  30 tablet    Refill:  11   isosorbide mononitrate (IMDUR) 60 MG 24 hr tablet    Sig: Take 1 tablet (60 mg total) by mouth daily.    Dispense:  90 tablet    Refill:  3    Patient Instructions  Medication Instructions:  Your physician has recommended you make the following change in your medication:   Continue to take a  FULL tablet of Spironolactone 25 mg daily (THESE ARE THE MEDICINE BOTTLES WE COMBINED IN THE OFFICE TODAY)   A new prescription has been sent to your pharmacy INCREASE the Imdur to 60 mg (Isosorbide) You may take 2 of the 30 mg tablets to use them up.  A  new prescription has been sent to the pharmacy for th new dose  *If you need a refill on your cardiac medications before your next appointment, please call your pharmacy*   Lab Work: TODAY:  BMET & BNP  If you have labs (blood work) drawn today and your tests are completely normal, you will receive your results only by: [place] (if you have MyChart) OR A paper copy in the mail If you have any lab test that is abnormal or we need to change your treatment, we will call you to review the results.   Testing/Procedures: None ordered   Follow-Up: At [facility], you and your health needs are our priority.  As part of our continuing mission to provide you with exceptional heart care, we have created designated Provider Care Teams.  These Care Teams include your primary Cardiologist (physician) and Advanced Practice Providers (APPs -  Physician Assistants and Nurse Practitioners) who all work together to provide you with the  care you need, when you need it.  We recommend signing up for the patient portal called "MyChart".  Sign up information is provided on this After Visit Summary.  MyChart is used to connect with patients for Virtual Visits (Telemedicine).  Patients are able to view lab/test results, encounter notes, upcoming appointments, etc.  Non-urgent messages can be sent to your provider as well.   To learn more about what you can do with MyChart, go to [email or web address].    Your next appointment:   KEEP APPOINTMENT   The format for your next appointment:   In Person  Provider:    Donato Heinz, MD .    Other Instructions Keep a record of your weight and bring with you to your next appointment.       Signed, Darreld Mclean, PA-C  05/28/2021 12:33 PM    [facility]

## 2021-05-19 NOTE — Telephone Encounter (Addendum)
Interrupter aware and agreeable ? ?----- Message from Andrey Farmer, PA-C sent at 05/18/2021  2:33 PM EST ----- ?Labs are stable other than glucose elevated which is not new for him. Has diabetes that has not been controlled. Added entresto today. Needs bmet with cardiology f/u in 2-3 weeks or can get lab here in 2 weeks if cardiology f/u will not be until further out. ?

## 2021-05-28 ENCOUNTER — Other Ambulatory Visit: Payer: Self-pay

## 2021-05-28 ENCOUNTER — Encounter: Payer: Self-pay | Admitting: Student

## 2021-05-28 ENCOUNTER — Ambulatory Visit (INDEPENDENT_AMBULATORY_CARE_PROVIDER_SITE_OTHER): Payer: Medicaid Other | Admitting: Student

## 2021-05-28 VITALS — BP 122/78 | HR 90 | Ht 69.0 in | Wt 206.0 lb

## 2021-05-28 DIAGNOSIS — I2510 Atherosclerotic heart disease of native coronary artery without angina pectoris: Secondary | ICD-10-CM

## 2021-05-28 DIAGNOSIS — I5042 Chronic combined systolic (congestive) and diastolic (congestive) heart failure: Secondary | ICD-10-CM

## 2021-05-28 DIAGNOSIS — E785 Hyperlipidemia, unspecified: Secondary | ICD-10-CM

## 2021-05-28 DIAGNOSIS — Z794 Long term (current) use of insulin: Secondary | ICD-10-CM

## 2021-05-28 DIAGNOSIS — I480 Paroxysmal atrial fibrillation: Secondary | ICD-10-CM

## 2021-05-28 DIAGNOSIS — I10 Essential (primary) hypertension: Secondary | ICD-10-CM

## 2021-05-28 DIAGNOSIS — Z720 Tobacco use: Secondary | ICD-10-CM

## 2021-05-28 DIAGNOSIS — E118 Type 2 diabetes mellitus with unspecified complications: Secondary | ICD-10-CM

## 2021-05-28 LAB — BASIC METABOLIC PANEL
BUN/Creatinine Ratio: 15 (ref 9–20)
BUN: 16 mg/dL (ref 6–24)
CO2: 22 mmol/L (ref 20–29)
Calcium: 9.9 mg/dL (ref 8.7–10.2)
Chloride: 97 mmol/L (ref 96–106)
Creatinine, Ser: 1.06 mg/dL (ref 0.76–1.27)
Glucose: 186 mg/dL — ABNORMAL HIGH (ref 70–99)
Potassium: 4.3 mmol/L (ref 3.5–5.2)
Sodium: 137 mmol/L (ref 134–144)
eGFR: 82 mL/min/{1.73_m2} (ref >59)

## 2021-05-28 MED ORDER — ISOSORBIDE MONONITRATE ER 60 MG PO TB24: 60.0000 mg | ORAL_TABLET | Freq: Every day | ORAL | 3 refills | Status: DC

## 2021-05-28 MED ORDER — POTASSIUM CHLORIDE CRYS ER 20 MEQ PO TBCR: 20.0000 meq | EXTENDED_RELEASE_TABLET | Freq: Every day | ORAL | 11 refills | Status: DC

## 2021-05-28 MED ORDER — SPIRONOLACTONE 25 MG PO TABS: 25.0000 mg | ORAL_TABLET | Freq: Every day | ORAL | 3 refills | Status: DC

## 2021-05-28 MED ORDER — EZETIMIBE 10 MG PO TABS: 10.0000 mg | ORAL_TABLET | Freq: Every day | ORAL | 3 refills | Status: DC

## 2021-05-28 NOTE — Patient Instructions (Addendum)
Medication Instructions:  ?Your physician has recommended you make the following change in your medication:  ? Continue to take a FULL tablet of Spironolactone 25 mg daily (THESE ARE THE MEDICINE BOTTLES WE COMBINED IN THE OFFICE TODAY)   A new prescription has been sent to your pharmacy ?INCREASE the Imdur to 60 mg (Isosorbide) You may take 2 of the 30 mg tablets to use them up.  A  new prescription has been sent to the pharmacy for th new dose ? ?*If you need a refill on your cardiac medications before your next appointment, please call your pharmacy* ? ? ?Lab Work: ?TODAY:  BMET & BNP ? ?If you have labs (blood work) drawn today and your tests are completely normal, you will receive your results only by: ?MyChart Message (if you have MyChart) OR ?A paper copy in the mail ?If you have any lab test that is abnormal or we need to change your treatment, we will call you to review the results. ? ? ?Testing/Procedures: ?None ordered ? ? ?Follow-Up: ?At [facility], you and your health needs are our priority.  As part of our continuing mission to provide you with exceptional heart care, we have created designated Provider Care Teams.  These Care Teams include your primary Cardiologist (physician) and Advanced Practice Providers (APPs -  Physician Assistants and Nurse Practitioners) who all work together to provide you with the care you need, when you need it. ? ?We recommend signing up for the patient portal called "MyChart".  Sign up information is provided on this After Visit Summary.  MyChart is used to connect with patients for Virtual Visits (Telemedicine).  Patients are able to view lab/test results, encounter notes, upcoming appointments, etc.  Non-urgent messages can be sent to your provider as well.   ?To learn more about what you can do with MyChart, go to [email or web address].   ? ?Your next appointment:   ?KEEP APPOINTMENT  ? ?The format for your next appointment:   ?In Person ? ?Provider:   ?  Donato Heinz, MD .  ? ? ?Other Instructions ?Keep a record of your weight and bring with you to your next appointment.  ? ? ? ?

## 2021-05-29 LAB — PRO B NATRIURETIC PEPTIDE: NT-Pro BNP: 137 pg/mL (ref 0–210)

## 2021-05-31 ENCOUNTER — Ambulatory Visit (INDEPENDENT_AMBULATORY_CARE_PROVIDER_SITE_OTHER): Payer: Medicaid Other | Admitting: Primary Care

## 2021-06-01 ENCOUNTER — Other Ambulatory Visit: Payer: Self-pay

## 2021-06-03 NOTE — Telephone Encounter (Signed)
PA denied. Expedited appeal sent. Awaiting redetermination from [facility]. ?

## 2021-06-09 NOTE — Progress Notes (Signed)
Appeal approved for Entresto from 05/18/21-06/05/22 ?

## 2021-07-11 NOTE — Progress Notes (Signed)
Morning ?Cardiology Office Note:   ? ?Date:  07/14/2021  ? ?ID:  Larry Lewis, DOB [birth year], MRN [id number] ? ?PCP:  Grayce Sessions, NP  ?Cardiologist:  Little Ishikawa, MD  ?Electrophysiologist:  None  ? ?Referring MD: Grayce Sessions, NP  ? ?Chief Complaint  ?Patient presents with  ? Coronary Artery Disease  ? ? ?History of Present Illness:   ? ?Larry Lewis is a 56 y.o. male with a hx of severe multivessel CAD, combined systolic and diastolic heart failure, diabetes, hypertension, hyperlipidemia, asthma, tobacco use who presents as a hospital follow-up.  He was admitted to [facility] on 07/05/2019 with acute combined systolic and diastolic heart failure.  He reported a history of CAD, had 2 stents placed in 2007 in [place].  He had not followed with a cardiologist since that time.  He presented with volume overload, and was treated with IV Lasix.  TTE on 07/06/2019 showed LVEF 35 to 40%, normal RV function, possible bicuspid aortic valve (no AI or AS).  He underwent LHC/RHC on 4/20, which showed severe diffuse multivessel CAD, medical management recommended.  Right heart pressures were normal.  In addition he was diagnosed with type 2 diabetes, A1c 12.2%.  Also found to have LDL 211.  He was lost to follow-up from April through December 2021 as return to [place] as both of his parents were ill.  He was off all his medications for several months.  He was admitted to [facility] from 1/4 through 03/26/2020 after presenting with possible syncope and fall leading to humerus fracture.  He was walking down the stairs at his friend's restaurant and had an episode of lightheadedness and possible sudden loss of consciousness causing him to fall down the stairs and suffer right humeral neck fracture.  Orthostatics were negative.  Echocardiogram on 03/25/2020 showed EF 45 to 50%, grade 1 diastolic dysfunction, normal RV function, no significant valvular disease.  He declined surgery and follow-up with orthopedics was  arranged.  Cardiac monitor on 05/22/20 showed NSVT x 6 seconds.  He was admitted in February 2023 with acute on chronic combined heart failure.  He was diuresed with IV Lasix and discharged on Lasix 40 mg daily.  He was also noted to have A-fib with RVR during admission but converted to sinus rhythm spontaneously.  Started on Eliquis.  Aspirin was discontinued and he was discharged on Plavix plus Eliquis.  Echocardiogram 05/05/2021 showed EF 45 to 50%, grade 1 diastolic dysfunction, normal RV function, mild aortic stenosis. ? ?Since last clinic visit, he reports he has been doing okay.  States that he is having continuous chest pain.  Will last for days.  Also has shortness of breath with exertion.  Denies any lower extremity edema.  Has been having headaches.  Smoking 5 to 6 cigarettes/day. ? ?Wt Readings from Last 3 Encounters:  ?07/12/21 206 lb 6.4 oz (93.6 kg)  ?05/28/21 206 lb (93.4 kg)  ?05/18/21 207 lb (93.9 kg)  ? ? ? ?Past Medical History:  ?Diagnosis Date  ? Anxiety   ? Asthma   ? Chronic combined systolic and diastolic CHF (congestive heart failure) (HCC)   ? mildly reduced EF of 45-50% on Echo in 04/2021  ? Coronary artery disease   ? s/p 2 stents to RCA in 2007 in [place], [place]  ? Fall at home, initial encounter 03/24/2020  ? Hyperlipidemia   ? Hypertension   ? Paroxysmal atrial fibrillation (HCC)   ? Tobacco abuse   ? Type 2  diabetes mellitus (HCC)   ? ? ?Past Surgical History:  ?Procedure Laterality Date  ? CARDIAC CATHETERIZATION    ? RIGHT/LEFT HEART CATH AND CORONARY ANGIOGRAPHY N/A 07/09/2019  ? Procedure: RIGHT/LEFT HEART CATH AND CORONARY ANGIOGRAPHY;  Surgeon: Lennette Bihari, MD;  Location: [facility] INVASIVE CV LAB;  Service: Cardiovascular;  Laterality: N/A;  ? ? ?Current Medications: ?Current Meds  ?Medication Sig  ? acetaminophen (TYLENOL) 325 MG tablet Take 2 tablets (650 mg total) by mouth every 4 (four) hours as needed for headache or mild pain.  ? aspirin EC 81 MG tablet Take 1 tablet (81 mg total)  by mouth daily. Swallow whole.  ? busPIRone (BUSPAR) 5 MG tablet TAKE 1 TABLET BY MOUTH TWICE A DAY  ? Continuous Blood Gluc Sensor (DEXCOM G6 SENSOR) MISC 1 Device by Does not apply route as directed.  ? Continuous Blood Gluc Transmit (DEXCOM G6 TRANSMITTER) MISC 1 Device by Does not apply route as directed.  ? guaiFENesin (MUCINEX) 600 MG 12 hr tablet Take 1,200 mg by mouth 2 (two) times daily as needed for to loosen phlegm.  ? insulin aspart (NOVOLOG) 100 UNIT/ML FlexPen Inject 10 Units into the skin 3 (three) times daily with meals.  ? Insulin Glargine (BASAGLAR KWIKPEN) 100 UNIT/ML Inject 25 Units into the skin 2 (two) times daily.  ? Insulin Pen Needle 32G X 4 MM MISC USE AS DIRECTED IN THE MORNING, AT NOON, IN THE EVENING, AND AT BEDTIME.  ? metFORMIN (GLUCOPHAGE) 1000 MG tablet Take 1 tablet (1,000 mg total) by mouth daily.  ? omeprazole (PRILOSEC) 20 MG capsule Take 1 capsule (20 mg total) by mouth daily.  ? potassium chloride SA (KLOR-CON M) 20 MEQ tablet Take 1 tablet (20 mEq total) by mouth daily.  ? traZODone (DESYREL) 50 MG tablet Take 0.5-1 tablets (25-50 mg total) by mouth at bedtime as needed for sleep.  ? [DISCONTINUED] apixaban (ELIQUIS) 5 MG TABS tablet Take 1 tablet (5 mg total) by mouth 2 (two) times daily.  ? [DISCONTINUED] atorvastatin (LIPITOR) 80 MG tablet TAKE 1 TABLET (80 MG TOTAL) BY MOUTH DAILY.  ? [DISCONTINUED] clopidogrel (PLAVIX) 75 MG tablet Take 1 tablet (75 mg total) by mouth daily.  ? [DISCONTINUED] dapagliflozin propanediol (FARXIGA) 10 MG TABS tablet Take 1 tablet (10 mg total) by mouth daily.  ? [DISCONTINUED] ezetimibe (ZETIA) 10 MG tablet Take 1 tablet (10 mg total) by mouth daily.  ? [DISCONTINUED] famotidine (PEPCID) 10 MG tablet Take 1 tablet (10 mg total) by mouth 2 (two) times daily.  ? [DISCONTINUED] furosemide (LASIX) 40 MG tablet Take 1 tablet (40 mg total) by mouth daily.  ? [DISCONTINUED] isosorbide mononitrate (IMDUR) 60 MG 24 hr tablet Take 1 tablet (60 mg  total) by mouth daily.  ? [DISCONTINUED] metoprolol succinate (TOPROL XL) 100 MG 24 hr tablet Take 1 tablet (100 mg total) by mouth daily. Take with or immediately following a meal.  ? [DISCONTINUED] nitroGLYCERIN (NITROSTAT) 0.4 MG SL tablet PLACE 1 TABLET (0.4 MG TOTAL) UNDER THE TONGUE EVERY 5 (FIVE) MINUTES X 3 DOSES AS NEEDED FOR CHEST PAIN. (Patient taking differently: Place 0.4 mg under the tongue every 5 (five) minutes x 3 doses as needed for chest pain.)  ? [DISCONTINUED] sacubitril-valsartan (ENTRESTO) 24-26 MG Take 1 tablet by mouth 2 (two) times daily.  ? [DISCONTINUED] spironolactone (ALDACTONE) 25 MG tablet Take 1 tablet (25 mg total) by mouth daily.  ?  ? ?Allergies:   Penicillins  ? ?Social History  ? ?Socioeconomic History  ?  Marital status: Single  ?  Spouse name: Not on file  ? Number of children: Not on file  ? Years of education: Not on file  ? Highest education level: Not on file  ?Occupational History  ? Not on file  ?Tobacco Use  ? Smoking status: Some Days  ?  Packs/day: 1.00  ?  Years: 0.50  ?  Pack years: 0.50  ?  Types: Cigarettes  ? Smokeless tobacco: Never  ?Vaping Use  ? Vaping Use: Never used  ?Substance and Sexual Activity  ? Alcohol use: Never  ? Drug use: Never  ? Sexual activity: Not on file  ?Other Topics Concern  ? Not on file  ?Social History Narrative  ? Not on file  ? ?Social Determinants of Health  ? ?Financial Resource Strain: High Risk  ? Difficulty of Paying Living Expenses: Hard  ?Food Insecurity: Food Insecurity Present  ? Worried About Programme researcher, broadcasting/film/videounning Out of Food in the Last Year: Sometimes true  ? Ran Out of Food in the Last Year: Sometimes true  ?Transportation Needs: No Transportation Needs  ? Lack of Transportation (Medical): No  ? Lack of Transportation (Non-Medical): No  ?Physical Activity: Not on file  ?Stress: Not on file  ?Social Connections: Not on file  ?  ? ?Family History: ?The patient's family history is negative for Other. ? ?ROS:   ?Please see the history of  present illness.   ? ?EKGs/Labs/Other Studies Reviewed:   ? ?The following studies were reviewed today: ? ? ?EKG:  ?07/12/2021: Sinus tachycardia, rate 102, nonspecific T wave flattening ?04/06/21: Sinus rhythm, rate

## 2021-07-12 ENCOUNTER — Ambulatory Visit (INDEPENDENT_AMBULATORY_CARE_PROVIDER_SITE_OTHER): Payer: Medicaid Other | Admitting: Cardiology

## 2021-07-12 ENCOUNTER — Encounter: Payer: Self-pay | Admitting: Cardiology

## 2021-07-12 ENCOUNTER — Other Ambulatory Visit: Payer: Self-pay | Admitting: Cardiology

## 2021-07-12 VITALS — BP 117/74 | HR 95 | Ht 69.0 in | Wt 206.4 lb

## 2021-07-12 DIAGNOSIS — I10 Essential (primary) hypertension: Secondary | ICD-10-CM | POA: Diagnosis not present

## 2021-07-12 DIAGNOSIS — I25118 Atherosclerotic heart disease of native coronary artery with other forms of angina pectoris: Secondary | ICD-10-CM | POA: Diagnosis not present

## 2021-07-12 DIAGNOSIS — E785 Hyperlipidemia, unspecified: Secondary | ICD-10-CM

## 2021-07-12 DIAGNOSIS — I2510 Atherosclerotic heart disease of native coronary artery without angina pectoris: Secondary | ICD-10-CM

## 2021-07-12 DIAGNOSIS — I480 Paroxysmal atrial fibrillation: Secondary | ICD-10-CM | POA: Diagnosis not present

## 2021-07-12 DIAGNOSIS — Z720 Tobacco use: Secondary | ICD-10-CM

## 2021-07-12 DIAGNOSIS — I5042 Chronic combined systolic (congestive) and diastolic (congestive) heart failure: Secondary | ICD-10-CM

## 2021-07-12 MED ORDER — ENTRESTO 24-26 MG PO TABS: 1.0000 | ORAL_TABLET | Freq: Two times a day (BID) | ORAL | 3 refills | Status: DC

## 2021-07-12 MED ORDER — NITROGLYCERIN 0.4 MG SL SUBL: SUBLINGUAL_TABLET | SUBLINGUAL | 3 refills | Status: DC | PRN

## 2021-07-12 MED ORDER — EZETIMIBE 10 MG PO TABS: 10.0000 mg | ORAL_TABLET | Freq: Every day | ORAL | 3 refills | Status: DC

## 2021-07-12 MED ORDER — DAPAGLIFLOZIN PROPANEDIOL 10 MG PO TABS: 10.0000 mg | ORAL_TABLET | Freq: Every day | ORAL | 3 refills | Status: DC

## 2021-07-12 MED ORDER — METOPROLOL SUCCINATE ER 200 MG PO TB24: 200.0000 mg | ORAL_TABLET | Freq: Every day | ORAL | 3 refills | Status: DC

## 2021-07-12 MED ORDER — ASPIRIN EC 81 MG PO TBEC: 81.0000 mg | DELAYED_RELEASE_TABLET | Freq: Every day | ORAL | 3 refills | Status: DC

## 2021-07-12 MED ORDER — OMEPRAZOLE 20 MG PO CPDR: 20.0000 mg | DELAYED_RELEASE_CAPSULE | Freq: Every day | ORAL | 3 refills | Status: DC

## 2021-07-12 MED ORDER — ATORVASTATIN CALCIUM 80 MG PO TABS: ORAL_TABLET | Freq: Every day | ORAL | 3 refills | Status: DC

## 2021-07-12 MED ORDER — SPIRONOLACTONE 25 MG PO TABS: 25.0000 mg | ORAL_TABLET | Freq: Every day | ORAL | 3 refills | Status: DC

## 2021-07-12 MED ORDER — APIXABAN 5 MG PO TABS: 5.0000 mg | ORAL_TABLET | Freq: Two times a day (BID) | ORAL | 3 refills | Status: DC

## 2021-07-12 MED ORDER — ISOSORBIDE MONONITRATE ER 30 MG PO TB24: 30.0000 mg | ORAL_TABLET | Freq: Every day | ORAL | 3 refills | Status: DC

## 2021-07-12 MED ORDER — FUROSEMIDE 40 MG PO TABS: 40.0000 mg | ORAL_TABLET | Freq: Every day | ORAL | 3 refills | Status: DC

## 2021-07-12 NOTE — Patient Instructions (Addendum)
Medication Instructions:  ?STOP Plavix ?STOP famotidine ? ?START aspirin 81 mg daily ?START omeprazole 20 mg daily ? ?DECREASE isosorbide (Imdur) to 30 mg daily ?INCREASE metoprolol (Toprol XL) to 200 mg daily ?Continue Lasix 40 mg daily ? ?*If you need a refill on your cardiac medications before your next appointment, please call your pharmacy* ? ? ?Lab Work: ?BMET, Mag today ? ?If you have labs (blood work) drawn today and your tests are completely normal, you will receive your results only by: ?MyChart Message (if you have MyChart) OR ?A paper copy in the mail ?If you have any lab test that is abnormal or we need to change your treatment, we will call you to review the results. ? ? ?Follow-Up: ?At [facility], you and your health needs are our priority.  As part of our continuing mission to provide you with exceptional heart care, we have created designated Provider Care Teams.  These Care Teams include your primary Cardiologist (physician) and Advanced Practice Providers (APPs -  Physician Assistants and Nurse Practitioners) who all work together to provide you with the care you need, when you need it. ? ?We recommend signing up for the patient portal called "MyChart".  Sign up information is provided on this After Visit Summary.  MyChart is used to connect with patients for Virtual Visits (Telemedicine).  Patients are able to view lab/test results, encounter notes, upcoming appointments, etc.  Non-urgent messages can be sent to your provider as well.   ?To learn more about what you can do with MyChart, go to [email or web address].   ? ?Your next appointment:   ?6 month(s) ? ?The format for your next appointment:   ?In Person ? ?Provider:   ?Little Ishikawa, MD { ? ? ? ?Important Information About Sugar ? ? ? ? ? ? ?

## 2021-07-13 LAB — BASIC METABOLIC PANEL
BUN/Creatinine Ratio: 12 (ref 9–20)
BUN: 13 mg/dL (ref 6–24)
CO2: 24 mmol/L (ref 20–29)
Calcium: 10.2 mg/dL (ref 8.7–10.2)
Chloride: 96 mmol/L (ref 96–106)
Creatinine, Ser: 1.11 mg/dL (ref 0.76–1.27)
Glucose: 176 mg/dL — ABNORMAL HIGH (ref 70–99)
Potassium: 4.2 mmol/L (ref 3.5–5.2)
Sodium: 140 mmol/L (ref 134–144)
eGFR: 77 mL/min/{1.73_m2} (ref >59)

## 2021-07-13 LAB — MAGNESIUM: Magnesium: 2.2 mg/dL (ref 1.6–2.3)

## 2021-07-14 ENCOUNTER — Encounter: Payer: Self-pay | Admitting: *Deleted

## 2021-07-15 ENCOUNTER — Other Ambulatory Visit: Payer: Self-pay | Admitting: Internal Medicine

## 2021-07-16 ENCOUNTER — Other Ambulatory Visit: Payer: Self-pay | Admitting: Cardiology

## 2021-07-19 ENCOUNTER — Encounter (INDEPENDENT_AMBULATORY_CARE_PROVIDER_SITE_OTHER): Payer: Self-pay | Admitting: Primary Care

## 2021-07-21 ENCOUNTER — Other Ambulatory Visit: Payer: Self-pay | Admitting: Internal Medicine

## 2021-07-22 ENCOUNTER — Other Ambulatory Visit: Payer: Self-pay | Admitting: Cardiology

## 2021-07-22 ENCOUNTER — Encounter (INDEPENDENT_AMBULATORY_CARE_PROVIDER_SITE_OTHER): Payer: Self-pay | Admitting: Primary Care

## 2021-07-22 ENCOUNTER — Other Ambulatory Visit: Payer: Self-pay | Admitting: Internal Medicine

## 2021-07-23 ENCOUNTER — Other Ambulatory Visit: Payer: Self-pay | Admitting: Internal Medicine

## 2021-07-23 ENCOUNTER — Telehealth: Payer: Self-pay | Admitting: Pharmacy Technician

## 2021-07-23 ENCOUNTER — Other Ambulatory Visit (HOSPITAL_COMMUNITY): Payer: Self-pay

## 2021-07-23 NOTE — Telephone Encounter (Signed)
Patient Advocate Encounter ? ?Received notification that the request for prior authorization for  Dexcom G6 Sensor and Dexcom G6 Transmitter  has been denied due to; The following criteria were not met: the beneficiary?s insulin treatment regimen requires frequent adjustment based on standard BGM or nontherapeutic CGM testing. ? ? PA Case ID: [id number] ? ?Lady Deutscher, CPhT-Adv ?Pharmacy Patient Advocate Specialist ?[place] Patient Advocate Team ?Direct Number: [phone]  Fax: [phone] ? ?

## 2021-07-23 NOTE — Telephone Encounter (Signed)
Patient Advocate Encounter ?  ?Received notification that prior authorization for Dexcom G6 Sensor and Dexcom G6 Transmitter is required. ?  ?PA submitted on 07/23/2021 ?Dexcom G6 Sensor Key: [id number] ?Dexcom G6 Transmitter Key: [id number] ?Status is pending ?   ? ? ?Jinger Neighbors, CPhT-Adv ?Pharmacy Patient Advocate Specialist ?[facility] Patient Advocate Team ?Direct Number: [phone]  Fax: [phone] ? ?

## 2021-07-27 NOTE — Telephone Encounter (Addendum)
Patient Advocate Encounter ?  ?Trying to resubmit PA request. Pt does require frequent insulin changes and meets all other criteria. Waiting for clincal questions to load.  ?  ?PA submitted on  ?Key [id number] ? ?CoverMyMeds notes that this case cannot be processed electronically. Called [phone] to request peer to peer to tell them to change the answer to the clinical question about frequent dose changes. Gave the office # for the call back and my dest # for an alternative in hopes that someone is able to answer when they call.  ? ?Sherilyn Dacosta, CPhT ?Specialty Pharmacy Patient Advocate ?Phone: [phone] ?Fax: [phone] ? ?

## 2021-07-28 NOTE — Telephone Encounter (Signed)
Called plan and cancelled Peer to Peer request. ?

## 2021-07-29 ENCOUNTER — Other Ambulatory Visit (HOSPITAL_COMMUNITY): Payer: Self-pay

## 2021-07-29 NOTE — Telephone Encounter (Signed)
Patient Advocate Encounter ? ?Prior Authorization for [employer] has been approved.   ? ?PA# Ticket # [id number] &  [id number] ?Effective dates: 07/22/21 through 01/13/22 ? ?Per Test Claim Patients co-pay is $0.  ? ?Spoke with Pharmacy to Process. ? ?Patient Advocate ?Fax:  [phone] ? ?

## 2021-07-30 ENCOUNTER — Other Ambulatory Visit (HOSPITAL_COMMUNITY): Payer: Self-pay

## 2021-08-11 ENCOUNTER — Ambulatory Visit (HOSPITAL_COMMUNITY): Payer: Medicaid Other | Admitting: Licensed Clinical Social Worker

## 2021-08-13 ENCOUNTER — Other Ambulatory Visit (INDEPENDENT_AMBULATORY_CARE_PROVIDER_SITE_OTHER): Payer: Self-pay | Admitting: Primary Care

## 2021-08-13 DIAGNOSIS — F411 Generalized anxiety disorder: Secondary | ICD-10-CM

## 2021-08-17 NOTE — Telephone Encounter (Signed)
Routed to PCP 

## 2021-12-06 ENCOUNTER — Other Ambulatory Visit: Payer: Self-pay | Admitting: Internal Medicine

## 2021-12-06 ENCOUNTER — Other Ambulatory Visit (INDEPENDENT_AMBULATORY_CARE_PROVIDER_SITE_OTHER): Payer: Self-pay | Admitting: Primary Care

## 2021-12-06 DIAGNOSIS — F5104 Psychophysiologic insomnia: Secondary | ICD-10-CM

## 2021-12-13 ENCOUNTER — Encounter: Payer: Self-pay | Admitting: Cardiology

## 2021-12-21 ENCOUNTER — Telehealth: Payer: Self-pay | Admitting: Internal Medicine

## 2021-12-21 ENCOUNTER — Other Ambulatory Visit: Payer: Self-pay | Admitting: Internal Medicine

## 2021-12-21 ENCOUNTER — Ambulatory Visit (INDEPENDENT_AMBULATORY_CARE_PROVIDER_SITE_OTHER): Payer: Medicaid Other | Admitting: Primary Care

## 2021-12-21 ENCOUNTER — Encounter (INDEPENDENT_AMBULATORY_CARE_PROVIDER_SITE_OTHER): Payer: Self-pay

## 2021-12-21 ENCOUNTER — Other Ambulatory Visit (INDEPENDENT_AMBULATORY_CARE_PROVIDER_SITE_OTHER): Payer: Self-pay

## 2021-12-21 DIAGNOSIS — F411 Generalized anxiety disorder: Secondary | ICD-10-CM

## 2021-12-21 DIAGNOSIS — Z760 Encounter for issue of repeat prescription: Secondary | ICD-10-CM

## 2021-12-21 DIAGNOSIS — E1165 Type 2 diabetes mellitus with hyperglycemia: Secondary | ICD-10-CM

## 2021-12-21 MED ORDER — DEXCOM G6 SENSOR MISC: 1.0000 | 11 refills | Status: DC

## 2021-12-21 MED ORDER — BUSPIRONE HCL 5 MG PO TABS: 5.0000 mg | ORAL_TABLET | Freq: Two times a day (BID) | ORAL | 1 refills | Status: DC

## 2021-12-21 MED ORDER — DEXCOM G6 TRANSMITTER MISC: 1.0000 | 3 refills | Status: DC

## 2021-12-21 MED ORDER — METFORMIN HCL 1000 MG PO TABS: 1000.0000 mg | ORAL_TABLET | Freq: Every day | ORAL | 0 refills | Status: DC

## 2021-12-21 MED ORDER — DAPAGLIFLOZIN PROPANEDIOL 10 MG PO TABS: 10.0000 mg | ORAL_TABLET | Freq: Every day | ORAL | 0 refills | Status: DC

## 2021-12-21 MED ORDER — BASAGLAR KWIKPEN 100 UNIT/ML ~~LOC~~ SOPN: 25.0000 [IU] | PEN_INJECTOR | Freq: Two times a day (BID) | SUBCUTANEOUS | 0 refills | Status: DC

## 2021-12-21 MED ORDER — INSULIN ASPART 100 UNIT/ML FLEXPEN: 10.0000 [IU] | PEN_INJECTOR | Freq: Three times a day (TID) | SUBCUTANEOUS | 0 refills | Status: DC

## 2021-12-21 NOTE — Telephone Encounter (Signed)
Medication filled but that was one that was not on patient list and it was not filled

## 2021-12-21 NOTE — Telephone Encounter (Signed)
MEDICATION: Insulin Glargine, Dapagliflozin  Propanediol 10 mg, Dexcom G6, Insulin aspart Novolog 100 unit, Metformin 1000 MG  PHARMACY:  [facility]  HAS THE PATIENT CONTACTED THEIR PHARMACY?  no  IS THIS A 90 DAY SUPPLY : unknown  IS PATIENT OUT OF MEDICATION: yes  IF NOT; HOW MUCH IS LEFT:   LAST APPOINTMENT DATE: @5 /25/2022  NEXT APPOINTMENT DATE:@10 /08/2021  DO WE HAVE YOUR PERMISSION TO LEAVE A DETAILED MESSAGE?:  OTHER COMMENTS:    **Let patient know to contact pharmacy at the end of the day to make sure medication is ready. **  ** Please notify patient to allow 48-72 hours to process**  **Encourage patient to contact the pharmacy for refills or they can request refills through [facility]**

## 2021-12-22 ENCOUNTER — Other Ambulatory Visit: Payer: Self-pay | Admitting: Internal Medicine

## 2021-12-22 MED ORDER — INSULIN GLARGINE-YFGN 100 UNIT/ML ~~LOC~~ SOPN: 25.0000 [IU] | PEN_INJECTOR | Freq: Two times a day (BID) | SUBCUTANEOUS | 0 refills | Status: DC

## 2021-12-24 ENCOUNTER — Encounter: Payer: Self-pay | Admitting: Internal Medicine

## 2021-12-24 ENCOUNTER — Ambulatory Visit (INDEPENDENT_AMBULATORY_CARE_PROVIDER_SITE_OTHER): Payer: Medicaid Other | Admitting: Internal Medicine

## 2021-12-24 VITALS — BP 128/70 | HR 81 | Ht 69.0 in | Wt 209.0 lb

## 2021-12-24 DIAGNOSIS — Z794 Long term (current) use of insulin: Secondary | ICD-10-CM

## 2021-12-24 DIAGNOSIS — E1165 Type 2 diabetes mellitus with hyperglycemia: Secondary | ICD-10-CM

## 2021-12-24 DIAGNOSIS — E1159 Type 2 diabetes mellitus with other circulatory complications: Secondary | ICD-10-CM | POA: Diagnosis not present

## 2021-12-24 DIAGNOSIS — Z760 Encounter for issue of repeat prescription: Secondary | ICD-10-CM

## 2021-12-24 LAB — POCT GLYCOSYLATED HEMOGLOBIN (HGB A1C): Hemoglobin A1C: 9.1 % — AB (ref 4.0–5.6)

## 2021-12-24 LAB — POCT GLUCOSE (DEVICE FOR HOME USE): POC Glucose: 295 mg/dl — AB (ref 70–99)

## 2021-12-24 MED ORDER — METFORMIN HCL 1000 MG PO TABS: 1000.0000 mg | ORAL_TABLET | Freq: Every day | ORAL | 0 refills | Status: DC

## 2021-12-24 MED ORDER — DEXCOM G6 SENSOR MISC: 1.0000 | 2 refills | Status: AC

## 2021-12-24 MED ORDER — BASAGLAR KWIKPEN 100 UNIT/ML ~~LOC~~ SOPN: 28.0000 [IU] | PEN_INJECTOR | Freq: Two times a day (BID) | SUBCUTANEOUS | 2 refills | Status: DC

## 2021-12-24 MED ORDER — DAPAGLIFLOZIN PROPANEDIOL 10 MG PO TABS: 10.0000 mg | ORAL_TABLET | Freq: Every day | ORAL | 2 refills | Status: DC

## 2021-12-24 MED ORDER — METFORMIN HCL 1000 MG PO TABS: 1000.0000 mg | ORAL_TABLET | Freq: Two times a day (BID) | ORAL | 2 refills | Status: DC

## 2021-12-24 MED ORDER — DEXCOM G6 TRANSMITTER MISC: 1.0000 | 2 refills | Status: AC

## 2021-12-24 MED ORDER — INSULIN ASPART 100 UNIT/ML FLEXPEN: PEN_INJECTOR | SUBCUTANEOUS | 2 refills | Status: DC

## 2021-12-24 MED ORDER — INSULIN PEN NEEDLE 29G X 5MM MISC: 1.0000 | Freq: Four times a day (QID) | 2 refills | Status: AC

## 2021-12-24 NOTE — Patient Instructions (Addendum)
-   Continue Metformin 1000 mg , 1 tablet daily - Continue farxiga 10 mg daily  - Increase Basaglar  28 units Twice daily  - Increase  Novolog 12 units with each meal  -Novolog correctional insulin: ADD extra units on insulin to your meal-time Novolog dose if your blood sugars are higher than 150. Use the scale below to help guide you:   Blood sugar before meal Number of units to inject  Less than 150 0 unit  151 -  170 1 units  171 -  190 2 units  191 -  210 3 units  211 -  230 4 units  231 -  250 5 units  251 -  270 6 units  271 -  290 7 units  291 -  310 8 units  311 - 330 9 units         HOW TO TREAT LOW BLOOD SUGARS (Blood sugar LESS THAN 70 MG/DL) Please follow the RULE OF 15 for the treatment of hypoglycemia treatment (when your (blood sugars are less than 70 mg/dL)   STEP 1: Take 15 grams of carbohydrates when your blood sugar is low, which includes:  3-4 GLUCOSE TABS  OR 3-4 OZ OF JUICE OR REGULAR SODA OR ONE TUBE OF GLUCOSE GEL    STEP 2: RECHECK blood sugar in 15 MINUTES STEP 3: If your blood sugar is still low at the 15 minute recheck --> then, go back to STEP 1 and treat AGAIN with another 15 grams of carbohydrates.

## 2021-12-24 NOTE — Progress Notes (Signed)
Name: Larry Lewis  Age/ Sex: 56 y.o., male   MRN/ DOB: [id number], 09-18-1965     PCP: [name], NP   Reason for Endocrinology Evaluation: Type 2 Diabetes Mellitus  Initial Endocrine Consultative Visit: 05/13/2020    PATIENT IDENTIFIER: [name] is a 56 y.o. male with a past medical history of T2DM, CAD, Asthma and anxiety . The patient has followed with Endocrinology clinic since 05/13/2020 for consultative assistance with management of his diabetes.  DIABETIC HISTORY:  [name] was diagnosed with DM in 2007,has been on Glipizide in the past, Has been on insulin for yrs due to hyperglycemia. His hemoglobin A1c has ranged from 7.5% in 2022, peaking at 12.2% in 2021.  On his initial visit to our clinic his A1c was 7.5%, we continued Metformin and MDI regimen and started Trulicity. We opted to avoid SGLT-2 inhibitors due to BPH symptoms    Intolerant to Trulicity due to vomiting   SUBJECTIVE:   During the last visit (05/13/2020): A1c 7.5% we continued Metformin and MDI regimen and started Trulicity     Today (84/03/6604): [name] is here for a follow up on diabetes management.The pt has NOT been seen in 17 months.  He is accompanied by his friend today.  He checks his blood sugars occasionally .  No meter today.    Patient had an ED visit in February 2023 for acute on chronic CHF and A-fib Patient was in [place] for 4 months, he has been out of his insulin for a few weeks   [place]:  Metformin 1000 mg , BID  Farxiga 10 mg daily  Basaglar 25 units Twice daily  Novolog 10 units with each meal       Statin: yes ACE-I/ARB: yes Prior Diabetic Education: no   METER DOWNLOAD SUMMARY: Did not bring    DIABETIC COMPLICATIONS: Microvascular complications:   Denies: CKD, neuropathy, retinopathy Last eye exam: Completed 11/27/2020   Macrovascular complications:  CAD ( S/P PCI) Denies:  PVD, CVA   HISTORY:  Past Medical History:  Past  Medical History:  Diagnosis Date   Anxiety    Asthma    Chronic combined systolic and diastolic CHF (congestive heart failure) ([place])    mildly reduced EF of 45-50% on Echo in 04/2021   Coronary artery disease    s/p 2 stents to RCA in 2007 in [place], [place]   Fall at home, initial encounter 03/24/2020   Hyperlipidemia    Hypertension    Paroxysmal atrial fibrillation ([place])    Tobacco abuse    Type 2 diabetes mellitus ([place])    Past Surgical History:  Past Surgical History:  Procedure Laterality Date   CARDIAC CATHETERIZATION     RIGHT/LEFT HEART CATH AND CORONARY ANGIOGRAPHY N/A 07/09/2019   Procedure: RIGHT/LEFT HEART CATH AND CORONARY ANGIOGRAPHY;  Surgeon: Troy Sine, MD;  Location: [facility];  Service: Cardiovascular;  Laterality: N/A;   Social History:  reports that he has been smoking cigarettes. He has a 0.50 pack-year smoking history. He has never used smokeless tobacco. He reports that he does not drink alcohol and does not use drugs. Family History:  Family History  Problem Relation Age of Onset   Other Neg Hx      HOME MEDICATIONS: Allergies as of 12/24/2021       Reactions   Penicillins Itching        Medication List        Accurate as of December 24, 2021 11:49 AM. If you have any questions, ask your nurse or doctor.          STOP taking these medications    insulin glargine-yfgn 100 UNIT/ML Pen Commonly known as: SEMGLEE Stopped by: Dorita Sciara, MD       TAKE these medications    acetaminophen 325 MG tablet Commonly known as: TYLENOL Take 2 tablets (650 mg total) by mouth every 4 (four) hours as needed for headache or mild pain.   apixaban 5 MG Tabs tablet Commonly known as: ELIQUIS Take 1 tablet (5 mg total) by mouth 2 (two) times daily.   aspirin EC 81 MG tablet Take 1 tablet (81 mg total) by mouth daily. Swallow whole.   atorvastatin 80 MG tablet Commonly known as: LIPITOR TAKE 1 TABLET (80 MG TOTAL) BY MOUTH  DAILY.   Basaglar KwikPen 100 UNIT/ML Inject 28 Units into the skin 2 (two) times daily. What changed: how much to take Changed by: Dorita Sciara, MD   busPIRone 5 MG tablet Commonly known as: BUSPAR Take 1 tablet (5 mg total) by mouth 2 (two) times daily.   dapagliflozin propanediol 10 MG Tabs tablet Commonly known as: FARXIGA Take 1 tablet (10 mg total) by mouth daily.   Dexcom G6 Sensor Misc 1 Device by Does not apply route as directed.   Dexcom G6 Transmitter Misc 1 Device by Does not apply route as directed.   Entresto 24-26 MG Generic drug: sacubitril-valsartan Take 1 tablet by mouth 2 (two) times daily.   ezetimibe 10 MG tablet Commonly known as: ZETIA Take 1 tablet (10 mg total) by mouth daily.   furosemide 40 MG tablet Commonly known as: LASIX Take 1 tablet (40 mg total) by mouth daily.   guaiFENesin 600 MG 12 hr tablet Commonly known as: MUCINEX Take 1,200 mg by mouth 2 (two) times daily as needed for to loosen phlegm.   insulin aspart 100 UNIT/ML FlexPen Commonly known as: NOVOLOG Max daily 50 units What changed:  how much to take how to take this when to take this additional instructions Changed by: Dorita Sciara, MD   Insulin Pen Needle 29G X 5MM Misc 1 Device by Does not apply route in the morning, at noon, in the evening, and at bedtime. What changed:  medication strength See the new instructions. Changed by: Dorita Sciara, MD   isosorbide mononitrate 30 MG 24 hr tablet Commonly known as: IMDUR Take 1 tablet (30 mg total) by mouth daily.   metFORMIN 1000 MG tablet Commonly known as: GLUCOPHAGE Take 1 tablet (1,000 mg total) by mouth daily.   metoprolol 200 MG 24 hr tablet Commonly known as: Toprol XL Take 1 tablet (200 mg total) by mouth daily. Take with or immediately following a meal.   nitroGLYCERIN 0.4 MG SL tablet Commonly known as: NITROSTAT PLACE 1 TABLET (0.4 MG TOTAL) UNDER THE TONGUE EVERY 5 (FIVE)  MINUTES X 3 DOSES AS NEEDED FOR CHEST PAIN.   omeprazole 20 MG capsule Commonly known as: PRILOSEC Take 1 capsule (20 mg total) by mouth daily.   potassium chloride SA 20 MEQ tablet Commonly known as: KLOR-CON M Take 1 tablet (20 mEq total) by mouth daily.   spironolactone 25 MG tablet Commonly known as: ALDACTONE Take 1 tablet (25 mg total) by mouth daily.   traZODone 50 MG tablet Commonly known as: DESYREL Take 0.5-1 tablets (25-50 mg total) by mouth at bedtime as needed for sleep.  OBJECTIVE:   Vital Signs: BP 128/70 (BP Location: Left Arm, Patient Position: Sitting, Cuff Size: Large)   Pulse 81   Ht 5' 9"  (1.753 m)   Wt 209 lb (94.8 kg)   SpO2 93%   BMI 30.86 kg/m   Wt Readings from Last 3 Encounters:  12/24/21 209 lb (94.8 kg)  07/12/21 206 lb 6.4 oz (93.6 kg)  05/28/21 206 lb (93.4 kg)     Exam: General: Pt appears well and is in NAD  Lungs: Clear with good BS bilat with no rales, rhonchi, or wheezes  Heart: RRR   Abdomen: Normoactive bowel sounds, soft, nontender, without masses or organomegaly palpable  Extremities: No pretibial edema.   Neuro: MS is good with appropriate affect, pt is alert and Ox3          DATA REVIEWED:  Lab Results  Component Value Date   HGBA1C 9.1 (A) 12/24/2021   HGBA1C 9.4 (H) 05/05/2021   HGBA1C 8.0 (A) 08/12/2020    Latest Reference Range & Units 07/12/21 09:37  Sodium 134 - 144 mmol/L 140  Potassium 3.5 - 5.2 mmol/L 4.2  Chloride 96 - 106 mmol/L 96  CO2 20 - 29 mmol/L 24  Glucose 70 - 99 mg/dL 176 (H)  BUN 6 - 24 mg/dL 13  Creatinine 0.76 - 1.27 mg/dL 1.11  Calcium 8.7 - 10.2 mg/dL 10.2  BUN/Creatinine Ratio 9 - 20  12  eGFR >59 mL/min/1.73 77  Magnesium 1.6 - 2.3 mg/dL 2.2      ASSESSMENT / PLAN / RECOMMENDATIONS:   1) Type 2 Diabetes Mellitus, poorly controlled, With macrovascular complications - Most recent A1c of 9.1%. Goal A1c < 7.0 %.    -Patient continues with hyperglycemia due to dietary  indiscretions and medication nonadherence -Intolerant to Trulicity due to vomiting -He has Dexcom G6 but has not applied it yet, his friend will help him with this -He is tolerating [place] without any side effects -I will increase his insulin as below -We will consider insulin pump technology if he has a successful use of Dexcom but unfortunately I have prescribed Dexcom multiple times in the past and he has yet to be able to use it  MEDICATIONS: -Continue Metformin 1000 mg , 1 tablet daily -Continue Farxiga 10 mg daily - Increase  Basaglar  28 units Twice daily  -Increase Novolog 12 units with each meal    EDUCATION / INSTRUCTIONS: BG monitoring instructions: Patient is instructed to check his blood sugars 3 times a day, before meals. Call [facility] if: BG persistently < 70  I reviewed the Rule of 15 for the treatment of hypoglycemia in detail with the patient. Literature supplied.   2) Diabetic complications:  Eye: Does not have known diabetic retinopathy.  Neuro/ Feet: Does not have known diabetic peripheral neuropathy .  Renal: Patient does not have known baseline CKD. He   is on an ACEI/ARB at present.   3)  Dyslipidemia:     - LDL as high as 211 in 06/2019.  I have refilled his atorvastatin in February 2022 - Emphasized the importance of compliance with statin intake     Medication Continue atorvastatin 80 mg daily     F/U in 4 months   Signed electronically by: Mack Guise, MD  [facility]  [facility] [address] [place],  [address] Phone: [phone] FAX: [phone]   CC: [name], NP [address] [address] Phone: [phone]  Fax: [phone]  Return to Endocrinology clinic as below: Future Appointments  Date Time Provider [place]  03/29/2022  9:20 AM Donato Heinz, MD [facility] None  07/05/2022  8:30 AM Tijuan Dantes,  Melanie Crazier, MD [facility] None

## 2021-12-27 ENCOUNTER — Telehealth: Payer: Self-pay

## 2021-12-27 ENCOUNTER — Other Ambulatory Visit (HOSPITAL_COMMUNITY): Payer: Self-pay

## 2021-12-27 NOTE — Telephone Encounter (Signed)
Patient Advocate Encounter  Prior Authorization for Dexcom G6 Sensor has been approved.    PA# [id number] Key: [place]  Approved quantity: 3 device per 30 day(s).  Effective dates: 12/27/2021 through 06/25/2022

## 2021-12-27 NOTE — Telephone Encounter (Signed)
Patient Advocate Encounter   Received notification that prior authorization for Dexcom G6 Sensor is required/requested.   PA submitted on 12/27/2021 to [facility] Medicaid of [place] via CoverMyMeds [facility] Status is pending

## 2022-03-12 ENCOUNTER — Other Ambulatory Visit (INDEPENDENT_AMBULATORY_CARE_PROVIDER_SITE_OTHER): Payer: Self-pay | Admitting: Primary Care

## 2022-03-12 DIAGNOSIS — F5104 Psychophysiologic insomnia: Secondary | ICD-10-CM

## 2022-03-15 NOTE — Telephone Encounter (Signed)
Needs appt

## 2022-03-28 NOTE — Progress Notes (Unsigned)
Morning Cardiology Office Note:    Date:  07/14/2021   ID:  [name], DOB [birth year], MRN [id number]  PCP:  Grayce Sessions, NP  Cardiologist:  Little Ishikawa, MD  Electrophysiologist:  None   Referring MD: Grayce Sessions, NP   Chief Complaint  Patient presents with   Coronary Artery Disease    History of Present Illness:    Larry Lewis is a 57 y.o. male with a hx of severe multivessel CAD, combined systolic and diastolic heart failure, diabetes, hypertension, hyperlipidemia, asthma, tobacco use who presents as a hospital follow-up.  He was admitted to [facility] on 07/05/2019 with acute combined systolic and diastolic heart failure.  He reported a history of CAD, had 2 stents placed in 2007 in [place].  He had not followed with a cardiologist since that time.  He presented with volume overload, and was treated with IV Lasix.  TTE on 07/06/2019 showed LVEF 35 to 40%, normal RV function, possible bicuspid aortic valve (no AI or AS).  He underwent LHC/RHC on 4/20, which showed severe diffuse multivessel CAD, medical management recommended.  Right heart pressures were normal.  In addition he was diagnosed with type 2 diabetes, A1c 12.2%.  Also found to have LDL 211.  He was lost to follow-up from April through December 2021 as return to [place] as both of his parents were ill.  He was off all his medications for several months.  He was admitted to [facility] from 1/4 through 03/26/2020 after presenting with possible syncope and fall leading to humerus fracture.  He was walking down the stairs at his friend's restaurant and had an episode of lightheadedness and possible sudden loss of consciousness causing him to fall down the stairs and suffer right humeral neck fracture.  Orthostatics were negative.  Echocardiogram on 03/25/2020 showed EF 45 to 50%, grade 1 diastolic dysfunction, normal RV function, no significant valvular disease.  He declined surgery and follow-up with orthopedics was  arranged.  Cardiac monitor on 05/22/20 showed NSVT x 6 seconds.  He was admitted in February 2023 with acute on chronic combined heart failure.  He was diuresed with IV Lasix and discharged on Lasix 40 mg daily.  He was also noted to have A-fib with RVR during admission but converted to sinus rhythm spontaneously.  Started on Eliquis.  Aspirin was discontinued and he was discharged on Plavix plus Eliquis.  Echocardiogram 05/05/2021 showed EF 45 to 50%, grade 1 diastolic dysfunction, normal RV function, mild aortic stenosis.  Since last clinic visit,  he reports he has been doing okay.  States that he is having continuous chest pain.  Will last for days.  Also has shortness of breath with exertion.  Denies any lower extremity edema.  Has been having headaches.  Smoking 5 to 6 cigarettes/day.  Wt Readings from Last 3 Encounters:  07/12/21 206 lb 6.4 oz (93.6 kg)  05/28/21 206 lb (93.4 kg)  05/18/21 207 lb (93.9 kg)     Past Medical History:  Diagnosis Date   Anxiety    Asthma    Chronic combined systolic and diastolic CHF (congestive heart failure) (HCC)    mildly reduced EF of 45-50% on Echo in 04/2021   Coronary artery disease    s/p 2 stents to RCA in 2007 in [place], [place]   Fall at home, initial encounter 03/24/2020   Hyperlipidemia    Hypertension    Paroxysmal atrial fibrillation (HCC)    Tobacco abuse    Type  2 diabetes mellitus ([place])     Past Surgical History:  Procedure Laterality Date   CARDIAC CATHETERIZATION     RIGHT/LEFT HEART CATH AND CORONARY ANGIOGRAPHY N/A 07/09/2019   Procedure: RIGHT/LEFT HEART CATH AND CORONARY ANGIOGRAPHY;  Surgeon: Troy Sine, MD;  Location: [facility];  Service: Cardiovascular;  Laterality: N/A;    Current Medications: Current Meds  Medication Sig   acetaminophen (TYLENOL) 325 MG tablet Take 2 tablets (650 mg total) by mouth every 4 (four) hours as needed for headache or mild pain.   aspirin EC 81 MG tablet Take 1 tablet (81 mg  total) by mouth daily. Swallow whole.   busPIRone (BUSPAR) 5 MG tablet TAKE 1 TABLET BY MOUTH TWICE A DAY   Continuous Blood Gluc Sensor (DEXCOM G6 SENSOR) MISC 1 Device by Does not apply route as directed.   Continuous Blood Gluc Transmit (DEXCOM G6 TRANSMITTER) MISC 1 Device by Does not apply route as directed.   guaiFENesin (MUCINEX) 600 MG 12 hr tablet Take 1,200 mg by mouth 2 (two) times daily as needed for to loosen phlegm.   insulin aspart (NOVOLOG) 100 UNIT/ML FlexPen Inject 10 Units into the skin 3 (three) times daily with meals.   Insulin Glargine (BASAGLAR KWIKPEN) 100 UNIT/ML Inject 25 Units into the skin 2 (two) times daily.   Insulin Pen Needle 32G X 4 MM MISC USE AS DIRECTED IN THE MORNING, AT NOON, IN THE EVENING, AND AT BEDTIME.   metFORMIN (GLUCOPHAGE) 1000 MG tablet Take 1 tablet (1,000 mg total) by mouth daily.   omeprazole (PRILOSEC) 20 MG capsule Take 1 capsule (20 mg total) by mouth daily.   potassium chloride SA (KLOR-CON M) 20 MEQ tablet Take 1 tablet (20 mEq total) by mouth daily.   traZODone (DESYREL) 50 MG tablet Take 0.5-1 tablets (25-50 mg total) by mouth at bedtime as needed for sleep.   [DISCONTINUED] apixaban (ELIQUIS) 5 MG TABS tablet Take 1 tablet (5 mg total) by mouth 2 (two) times daily.   [DISCONTINUED] atorvastatin (LIPITOR) 80 MG tablet TAKE 1 TABLET (80 MG TOTAL) BY MOUTH DAILY.   [DISCONTINUED] clopidogrel (PLAVIX) 75 MG tablet Take 1 tablet (75 mg total) by mouth daily.   [DISCONTINUED] dapagliflozin propanediol (FARXIGA) 10 MG TABS tablet Take 1 tablet (10 mg total) by mouth daily.   [DISCONTINUED] ezetimibe (ZETIA) 10 MG tablet Take 1 tablet (10 mg total) by mouth daily.   [DISCONTINUED] famotidine (PEPCID) 10 MG tablet Take 1 tablet (10 mg total) by mouth 2 (two) times daily.   [DISCONTINUED] furosemide (LASIX) 40 MG tablet Take 1 tablet (40 mg total) by mouth daily.   [DISCONTINUED] isosorbide mononitrate (IMDUR) 60 MG 24 hr tablet Take 1 tablet (60  mg total) by mouth daily.   [DISCONTINUED] metoprolol succinate (TOPROL XL) 100 MG 24 hr tablet Take 1 tablet (100 mg total) by mouth daily. Take with or immediately following a meal.   [DISCONTINUED] nitroGLYCERIN (NITROSTAT) 0.4 MG SL tablet PLACE 1 TABLET (0.4 MG TOTAL) UNDER THE TONGUE EVERY 5 (FIVE) MINUTES X 3 DOSES AS NEEDED FOR CHEST PAIN. (Patient taking differently: Place 0.4 mg under the tongue every 5 (five) minutes x 3 doses as needed for chest pain.)   [DISCONTINUED] sacubitril-valsartan (ENTRESTO) 24-26 MG Take 1 tablet by mouth 2 (two) times daily.   [DISCONTINUED] spironolactone (ALDACTONE) 25 MG tablet Take 1 tablet (25 mg total) by mouth daily.     Allergies:   Penicillins   Social History   Socioeconomic  History   Marital status: Single    Spouse name: Not on file   Number of children: Not on file   Years of education: Not on file   Highest education level: Not on file  Occupational History   Not on file  Tobacco Use   Smoking status: Some Days    Packs/day: 1.00    Years: 0.50    Pack years: 0.50    Types: Cigarettes   Smokeless tobacco: Never  Vaping Use   Vaping Use: Never used  Substance and Sexual Activity   Alcohol use: Never   Drug use: Never   Sexual activity: Not on file  Other Topics Concern   Not on file  Social History Narrative   Not on file   Social Determinants of Health   Financial Resource Strain: High Risk   Difficulty of Paying Living Expenses: Hard  Food Insecurity: Food Insecurity Present   Worried About Running Out of Food in the Last Year: Sometimes true   Ran Out of Food in the Last Year: Sometimes true  Transportation Needs: No Transportation Needs   Lack of Transportation (Medical): No   Lack of Transportation (Non-Medical): No  Physical Activity: Not on file  Stress: Not on file  Social Connections: Not on file     Family History: The patient's family history is negative for Other.  ROS:   Please see the history of  present illness.    EKGs/Labs/Other Studies Reviewed:    The following studies were reviewed today:   EKG:  07/12/2021: Sinus tachycardia, rate 102, nonspecific T wave flattening 04/06/21: Sinus rhythm, rate 96, less than 1 mm ST depressions in leads II, aVF, V5/6 10/08/20: sinus rhytm rate 81 >88mm ST depressions in leads I, II, aVL, V5/6 03/22: sinus rhythm, rate 96, less than 1 mm ST depressions in leads I, II, aVL, V5/6  TTE 07/06/19:  1. Left ventricular ejection fraction, by estimation, is 35 to 40%. The  left ventricle has moderately decreased function. The left ventricle  demonstrates regional wall motion abnormalities (see scoring  diagram/findings for description). Left ventricular   diastolic parameters are indeterminate.   2. Right ventricular systolic function is normal. The right ventricular  size is normal.   3. Left atrial size was moderately dilated.   4. The mitral valve is normal in structure. Mild mitral valve  regurgitation. No evidence of mitral stenosis.   5. Indeterminate cusp structure. Given age and amount of calcification,  high suspicion for bicuspid valve, either functional or congenital.. The  aortic valve has an indeterminant number of cusps. Aortic valve  regurgitation is not visualized. Mild to  moderate aortic valve sclerosis/calcification is present, without any  evidence of aortic stenosis.   6. The inferior vena cava is normal in size with greater than 50%  respiratory variability, suggesting right atrial pressure of 3 mmHg.   RHC/LHC 07/09/19: Mid RCA to Dist RCA lesion is 100% stenosed. Acute Mrg lesion is 90% stenosed. Ost RCA to Prox RCA lesion is 70% stenosed. Prox RCA to Mid RCA lesion is 90% stenosed. 1st Mrg lesion is 85% stenosed. 2nd Mrg-1 lesion is 100% stenosed. 2nd Mrg-2 lesion is 100% stenosed. 3rd Mrg lesion is 90% stenosed. Dist Cx lesion is 70% stenosed. Dist LAD lesion is 100% stenosed. 2nd Diag lesion is 95% stenosed. Mid  LAD lesion is 50% stenosed. 1st Diag-2 lesion is 40% stenosed. 1st Diag-1 lesion is 80% stenosed. Mid Cx to Dist Cx lesion is 30% stenosed.  Severe diffuse multivessel CAD with moderate irregularity of the LAD with 80% proximal diagonal stenosis prior to a previously placed stent with intimal hyperplasia within the stented segment, 50% the stenosis, 95% stenosis in the second diagonal vessel and total occlusion of the apical LAD; left circumflex vessel with large OM1 vessel with 85% stenosis proximal to an aneurysmal segment, total occlusion of the OM 2 vessel with previously placed stent in this vessel and 90 and 70% bifurcation stenoses in the distal circumflex; severely diffusely diseased RCA with distal occlusion.  There is faint filling of a probable small PLA vessel via the left injection.   Normal right heart pressures.   LVEDP 16 mmHg.   RECOMMENDATION: Plan initiate medical therapy since at present he is not on any anti-ischemic medications.  Most vessels are not amenable to intervention; however if patient experiences increasing chest pain symptomatology the large OM1 vessel can potentially undergo intervention with stenting.  Aggressive lipid-lowering therapy with target LDL less than 70.  Optimal blood pressure control.  Smoking cessation is essential.  With diffuse CAD consider DAPT therapy.  Recent Labs: 04/06/2021: ALT 59 05/05/2021: TSH 1.311 05/18/2021: B Natriuretic Peptide 36.6; Hemoglobin 15.5; Platelets 322 05/28/2021: NT-Pro BNP 137 07/12/2021: BUN 13; Creatinine, Ser 1.11; Magnesium 2.2; Potassium 4.2; Sodium 140  Recent Lipid Panel    Component Value Date/Time   CHOL 139 04/06/2021 1034   TRIG 247 (H) 04/06/2021 1034   HDL 34 (L) 04/06/2021 1034   CHOLHDL 4.1 04/06/2021 1034   CHOLHDL 8.1 07/07/2019 0336   VLDL 29 07/07/2019 0336   LDLCALC 65 04/06/2021 1034    Physical Exam:    VS:  BP 117/74   Pulse 95   Ht 5\' 9"  (1.753 m)   Wt 206 lb 6.4 oz (93.6 kg)    SpO2 98%   BMI 30.48 kg/m     Wt Readings from Last 3 Encounters:  07/12/21 206 lb 6.4 oz (93.6 kg)  05/28/21 206 lb (93.4 kg)  05/18/21 207 lb (93.9 kg)     GEN: in no acute distress HEENT: Normal NECK: No JVD CARDIAC:RRR, 2/6 systolic murmur RESPIRATORY:  Clear to auscultation without rales, wheezing or rhonchi  ABDOMEN: Soft, non-tender, non-distended MUSCULOSKELETAL:  No edema.  SKIN: Warm and dry NEUROLOGIC:  Alert and oriented x 3 PSYCHIATRIC:  Normal affect   ASSESSMENT:    1. Chronic combined systolic and diastolic congestive heart failure (HCC)   2. Paroxysmal atrial fibrillation (HCC)   3. Coronary artery disease of native artery of native heart with stable angina pectoris (HCC)   4. Primary hypertension   5. Hyperlipidemia, unspecified hyperlipidemia type   6. Tobacco abuse        PLAN:    CAD: Severe multivessel CAD not amenable to intervention on catheterization 07/09/2019.   -Continue aspirin 81 mg daily and Eliquis -Continue Toprol-XL 200 mg daily -Continue imdur 30 mg daily.  Could not tolerate higher doses due to headaches -Sublingual nitroglycerin as needed  Chronic combined systolic and diastolic heart failure: EF 35 to 40% on TTE 07/06/2019.  Echocardiogram on 03/25/2020 showed EF 45 to 50%, grade 1 diastolic dysfunction, normal RV function, no significant valvular disease.  Echocardiogram 05/05/2021 showed EF 45 to 50%, grade 1 diastolic dysfunction, normal RV function, mild aortic stenosis. -Continue Toprol-XL 200 mg daily -Continue Entresto 24-26 mg twice daily -Continue spironolactone 25 mg daily -Continue Farxiga 10 mg daily -Continue Imdur 30 mg daily -Continue lasix 40 mg daily -Check BMET, magnesium  Paroxysmal  atrial fibrillation: New onset atrial fibrillation during admission 04/2021.  Converted to sinus rhythm spontaneously.  CHA2DS2-VASc score 4 (CHF, hypertension, T2DM, CAD) -Continue Eliquis 5 mg twice daily -Continue Toprol-XL 200 mg  daily  Syncope: Had episode of lightheadedness/suspected syncope resulting in fall down stairs and found to have fractured humerus 03/2020. Cardiac monitor on 05/22/20 showed NSVT x 6 seconds.  He was not orthostatic on presentation.  Given his severe multivessel CAD and systolic heart failure, and considering NSVT on monitor, concern for ventricular arrhythmia as cause of his syncope.  Does not meet indication for ICD at this time, as his EF is greater than 35% and longest episode of NSVT was 6 seconds on cardiac monitor.  Recommend loop recorder for long-term monitoring but he declined.  States he will reconsider if he has another episode  Hyperlipidemia: LDL 190 on 02/24/2020 but had been off of statin.  Restarted atorvastatin 80 mg daily.  LDL 124 on 10/08/2020.  Added Zetia 10 mg daily.  LDL 65 03/2021  Type 2 diabetes: A1c 12.2 07/05/2019, improved to 7.5 on 03/25/2020.  His recent A1c 9.4% on 05/05/2021.  Continue insulin, follows with endocrinology.  Started on Farxiga  Tobacco use: Continues to smoke 5 to 6 cigarettes/day.  Patient counseled on risks of tobacco use and cessation strongly encouraged.    Lower extremity pain: Normal ABIs 03/04/2020  GERD: Continue omeprazole  ED: Reports was started on sildenafil by his PCP.  Recommend avoiding this given he is on Imdur for his CAD.  Referred to urology for evaluation   RTC in 6 months.  He is going to [place] for 6 months, will f/u when returns.  Refilled meds to last next 6 months until he returns   Medication Adjustments/Labs and Tests Ordered: Current medicines are reviewed at length with the patient today.  Concerns regarding medicines are outlined above.  Orders Placed This Encounter  Procedures   Basic metabolic panel   Magnesium   EKG 12-Lead     Meds ordered this encounter  Medications   apixaban (ELIQUIS) 5 MG TABS tablet    Sig: Take 1 tablet (5 mg total) by mouth 2 (two) times daily.    Dispense:  180 tablet    Refill:  3    atorvastatin (LIPITOR) 80 MG tablet    Sig: TAKE 1 TABLET (80 MG TOTAL) BY MOUTH DAILY.    Dispense:  90 tablet    Refill:  3   dapagliflozin propanediol (FARXIGA) 10 MG TABS tablet    Sig: Take 1 tablet (10 mg total) by mouth daily.    Dispense:  90 tablet    Refill:  3   ezetimibe (ZETIA) 10 MG tablet    Sig: Take 1 tablet (10 mg total) by mouth daily.    Dispense:  90 tablet    Refill:  3   furosemide (LASIX) 40 MG tablet    Sig: Take 1 tablet (40 mg total) by mouth daily.    Dispense:  90 tablet    Refill:  3   isosorbide mononitrate (IMDUR) 30 MG 24 hr tablet    Sig: Take 1 tablet (30 mg total) by mouth daily.    Dispense:  90 tablet    Refill:  3    Dose change   metoprolol (TOPROL XL) 200 MG 24 hr tablet    Sig: Take 1 tablet (200 mg total) by mouth daily. Take with or immediately following a meal.    Dispense:  90 tablet  Refill:  3    Dose increase   nitroGLYCERIN (NITROSTAT) 0.4 MG SL tablet    Sig: PLACE 1 TABLET (0.4 MG TOTAL) UNDER THE TONGUE EVERY 5 (FIVE) MINUTES X 3 DOSES AS NEEDED FOR CHEST PAIN.    Dispense:  25 tablet    Refill:  3   sacubitril-valsartan (ENTRESTO) 24-26 MG    Sig: Take 1 tablet by mouth 2 (two) times daily.    Dispense:  180 tablet    Refill:  3    D/c order for losartan   spironolactone (ALDACTONE) 25 MG tablet    Sig: Take 1 tablet (25 mg total) by mouth daily.    Dispense:  90 tablet    Refill:  3   aspirin EC 81 MG tablet    Sig: Take 1 tablet (81 mg total) by mouth daily. Swallow whole.    Dispense:  90 tablet    Refill:  3   omeprazole (PRILOSEC) 20 MG capsule    Sig: Take 1 capsule (20 mg total) by mouth daily.    Dispense:  90 capsule    Refill:  3     Patient Instructions  Medication Instructions:  STOP Plavix STOP famotidine  START aspirin 81 mg daily START omeprazole 20 mg daily  DECREASE isosorbide (Imdur) to 30 mg daily INCREASE metoprolol (Toprol XL) to 200 mg daily Continue Lasix 40 mg daily  *If you  need a refill on your cardiac medications before your next appointment, please call your pharmacy*   Lab Work: BMET, Mag today  If you have labs (blood work) drawn today and your tests are completely normal, you will receive your results only by: MyChart Message (if you have MyChart) OR A paper copy in the mail If you have any lab test that is abnormal or we need to change your treatment, we will call you to review the results.   Follow-Up: At [facility], you and your health needs are our priority.  As part of our continuing mission to provide you with exceptional heart care, we have created designated Provider Care Teams.  These Care Teams include your primary Cardiologist (physician) and Advanced Practice Providers (APPs -  Physician Assistants and Nurse Practitioners) who all work together to provide you with the care you need, when you need it.  We recommend signing up for the patient portal called "MyChart".  Sign up information is provided on this After Visit Summary.  MyChart is used to connect with patients for Virtual Visits (Telemedicine).  Patients are able to view lab/test results, encounter notes, upcoming appointments, etc.  Non-urgent messages can be sent to your provider as well.   To learn more about what you can do with MyChart, go to [email or web address].    Your next appointment:   6 month(s)  The format for your next appointment:   In Person  Provider:   Little Ishikawa, MD {    Important Information About Sugar            Signed, Little Ishikawa, MD  07/14/2021 8:25 AM    [facility]

## 2022-03-29 ENCOUNTER — Ambulatory Visit: Payer: Medicaid Other | Attending: Cardiology | Admitting: Cardiology

## 2022-03-29 VITALS — BP 122/68 | HR 79 | Ht 69.0 in | Wt 210.6 lb

## 2022-03-29 DIAGNOSIS — I10 Essential (primary) hypertension: Secondary | ICD-10-CM

## 2022-03-29 DIAGNOSIS — I25118 Atherosclerotic heart disease of native coronary artery with other forms of angina pectoris: Secondary | ICD-10-CM | POA: Diagnosis not present

## 2022-03-29 DIAGNOSIS — N529 Male erectile dysfunction, unspecified: Secondary | ICD-10-CM

## 2022-03-29 DIAGNOSIS — I5042 Chronic combined systolic (congestive) and diastolic (congestive) heart failure: Secondary | ICD-10-CM | POA: Diagnosis not present

## 2022-03-29 DIAGNOSIS — E785 Hyperlipidemia, unspecified: Secondary | ICD-10-CM

## 2022-03-29 DIAGNOSIS — I480 Paroxysmal atrial fibrillation: Secondary | ICD-10-CM | POA: Diagnosis not present

## 2022-03-29 LAB — CBC

## 2022-03-29 MED ORDER — METOPROLOL SUCCINATE ER 200 MG PO TB24: 200.0000 mg | ORAL_TABLET | Freq: Every day | ORAL | 3 refills | Status: DC

## 2022-03-29 MED ORDER — POTASSIUM CHLORIDE CRYS ER 20 MEQ PO TBCR: 20.0000 meq | EXTENDED_RELEASE_TABLET | Freq: Every day | ORAL | 3 refills | Status: AC

## 2022-03-29 MED ORDER — ISOSORBIDE MONONITRATE ER 30 MG PO TB24: 30.0000 mg | ORAL_TABLET | Freq: Every day | ORAL | 3 refills | Status: DC

## 2022-03-29 MED ORDER — ENTRESTO 24-26 MG PO TABS: 1.0000 | ORAL_TABLET | Freq: Two times a day (BID) | ORAL | 3 refills | Status: DC

## 2022-03-29 MED ORDER — FUROSEMIDE 40 MG PO TABS: 40.0000 mg | ORAL_TABLET | Freq: Every day | ORAL | 3 refills | Status: DC

## 2022-03-29 MED ORDER — EZETIMIBE 10 MG PO TABS: 10.0000 mg | ORAL_TABLET | Freq: Every day | ORAL | 3 refills | Status: DC

## 2022-03-29 MED ORDER — APIXABAN 5 MG PO TABS: 5.0000 mg | ORAL_TABLET | Freq: Two times a day (BID) | ORAL | 3 refills | Status: DC

## 2022-03-29 MED ORDER — ATORVASTATIN CALCIUM 80 MG PO TABS: ORAL_TABLET | Freq: Every day | ORAL | 3 refills | Status: DC

## 2022-03-29 MED ORDER — SPIRONOLACTONE 25 MG PO TABS: 25.0000 mg | ORAL_TABLET | Freq: Every day | ORAL | 3 refills | Status: DC

## 2022-03-29 NOTE — Patient Instructions (Addendum)
Medication Instructions:  Your physician recommends that you continue on your current medications as directed. Please refer to the Current Medication list given to you today.  *If you need a refill on your cardiac medications before your next appointment, please call your pharmacy*   Lab Work: CMET, CBC, Lipid today  If you have labs (blood work) drawn today and your tests are completely normal, you will receive your results only by: [place] (if you have MyChart) OR A paper copy in the mail If you have any lab test that is abnormal or we need to change your treatment, we will call you to review the results.  Follow-Up: At [facility], you and your health needs are our priority.  As part of our continuing mission to provide you with exceptional heart care, we have created designated Provider Care Teams.  These Care Teams include your primary Cardiologist (physician) and Advanced Practice Providers (APPs -  Physician Assistants and Nurse Practitioners) who all work together to provide you with the care you need, when you need it.  We recommend signing up for the patient portal called "MyChart".  Sign up information is provided on this After Visit Summary.  MyChart is used to connect with patients for Virtual Visits (Telemedicine).  Patients are able to view lab/test results, encounter notes, upcoming appointments, etc.  Non-urgent messages can be sent to your provider as well.   To learn more about what you can do with MyChart, go to [email or web address].    Your next appointment:   3 month(s)  The format for your next appointment:   In Person  Provider:   Donato Heinz, MD     Other Instructions You have been referred to: [facility] Phone number: [phone]

## 2022-03-30 LAB — COMPREHENSIVE METABOLIC PANEL
ALT: 38 IU/L (ref 0–44)
AST: 20 IU/L (ref 0–40)
Albumin/Globulin Ratio: 2 (ref 1.2–2.2)
Albumin: 4.9 g/dL (ref 3.8–4.9)
Alkaline Phosphatase: 64 IU/L (ref 44–121)
BUN/Creatinine Ratio: 17 (ref 9–20)
BUN: 18 mg/dL (ref 6–24)
Bilirubin Total: 0.2 mg/dL (ref 0.0–1.2)
CO2: 22 mmol/L (ref 20–29)
Calcium: 9.7 mg/dL (ref 8.7–10.2)
Chloride: 102 mmol/L (ref 96–106)
Creatinine, Ser: 1.09 mg/dL (ref 0.76–1.27)
Globulin, Total: 2.5 g/dL (ref 1.5–4.5)
Glucose: 163 mg/dL — ABNORMAL HIGH (ref 70–99)
Potassium: 4.3 mmol/L (ref 3.5–5.2)
Sodium: 140 mmol/L (ref 134–144)
Total Protein: 7.4 g/dL (ref 6.0–8.5)
eGFR: 79 mL/min/{1.73_m2} (ref >59)

## 2022-03-30 LAB — LIPID PANEL
Chol/HDL Ratio: 6.7 ratio — ABNORMAL HIGH (ref 0.0–5.0)
Cholesterol, Total: 182 mg/dL (ref 100–199)
HDL: 27 mg/dL — ABNORMAL LOW (ref >39)
LDL Chol Calc (NIH): 103 mg/dL — ABNORMAL HIGH (ref 0–99)
Triglycerides: 302 mg/dL — ABNORMAL HIGH (ref 0–149)
VLDL Cholesterol Cal: 52 mg/dL — ABNORMAL HIGH (ref 5–40)

## 2022-03-30 LAB — CBC
Hematocrit: 47.7 % (ref 37.5–51.0)
Hemoglobin: 15.1 g/dL (ref 13.0–17.7)
MCH: 24.4 pg — ABNORMAL LOW (ref 26.6–33.0)
MCHC: 31.7 g/dL (ref 31.5–35.7)
MCV: 77 fL — ABNORMAL LOW (ref 79–97)
Platelets: 292 10*3/uL (ref 150–450)
RBC: 6.2 x10E6/uL — ABNORMAL HIGH (ref 4.14–5.80)
RDW: 13.3 % (ref 11.6–15.4)
WBC: 10.3 10*3/uL (ref 3.4–10.8)

## 2022-04-01 ENCOUNTER — Other Ambulatory Visit: Payer: Self-pay | Admitting: Internal Medicine

## 2022-04-10 ENCOUNTER — Other Ambulatory Visit (INDEPENDENT_AMBULATORY_CARE_PROVIDER_SITE_OTHER): Payer: Self-pay | Admitting: Primary Care

## 2022-04-10 DIAGNOSIS — F5104 Psychophysiologic insomnia: Secondary | ICD-10-CM

## 2022-04-11 NOTE — Telephone Encounter (Signed)
Unable to refill per protocol, Rx request is too soon. Last refill 03/15/22.  Requested Prescriptions  Pending Prescriptions Disp Refills   traZODone (DESYREL) 50 MG tablet [Pharmacy Med Name: TRAZODONE 50 MG TABLET] 90 tablet 1    Sig: TAKE 1/2 TO 1 TABLET BY MOUTH AT BEDTIME AS NEEDED FOR SLEEP     Psychiatry: Antidepressants - Serotonin Modulator Failed - 04/10/2022  1:30 PM      Failed - Valid encounter within last 6 months    Recent Outpatient Visits           10 months ago Tobacco abuse   [place], Michelle P, NP   1 year ago Colon cancer screening   [place] [facility] [name], NP   1 year ago Urinary frequency   [place] [facility] [name], NP   2 years ago Generalized anxiety disorder   [place] [facility] Mayers, Cari S, PA-C   2 years ago Generalized anxiety disorder   [place] [facility] [name], NP

## 2022-04-28 ENCOUNTER — Other Ambulatory Visit (HOSPITAL_COMMUNITY): Payer: Self-pay

## 2022-05-07 IMAGING — CT CT SHOULDER*R* W/O CM
3 series · 14 of 33 positions shown, 17 images · non-contrast
Comparison: Plain radiographs 03/24/2020

CLINICAL DATA: Right shoulder fracture

EXAM:
CT OF THE UPPER RIGHT EXTREMITY WITHOUT CONTRAST
TECHNIQUE: Multidetector CT imaging of the upper right extremity was performed
according to the standard protocol.

[Series 4: (id) st ax · axial · 0.52mm/px · z∈[+1398,+1556]mm · 6 of 103 slices shown, 8 images]
[im 16/103  soft-tissue]
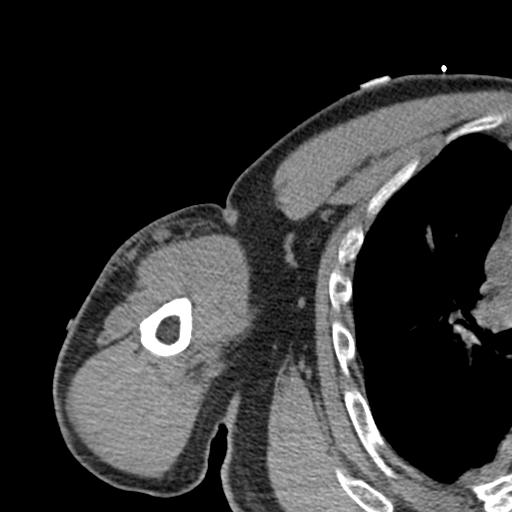
[im 16/103  bone]
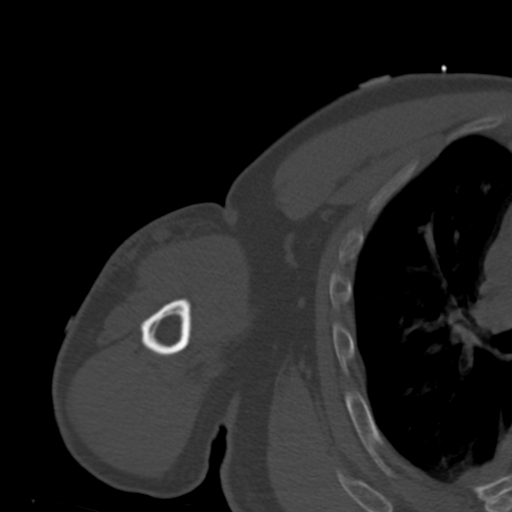
[im 32/103  bone]
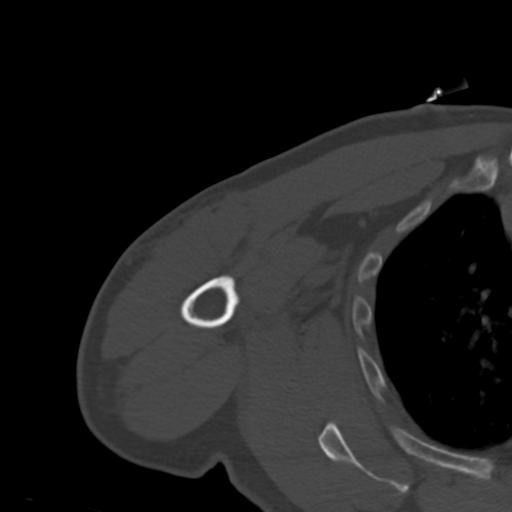
[im 48/103  bone]
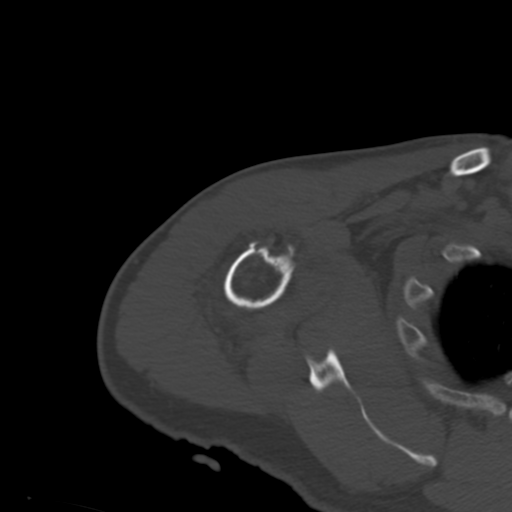
[im 63/103  bone]
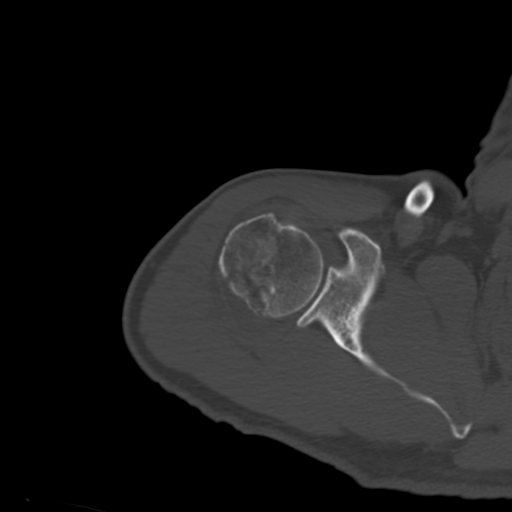
[im 79/103  soft-tissue]
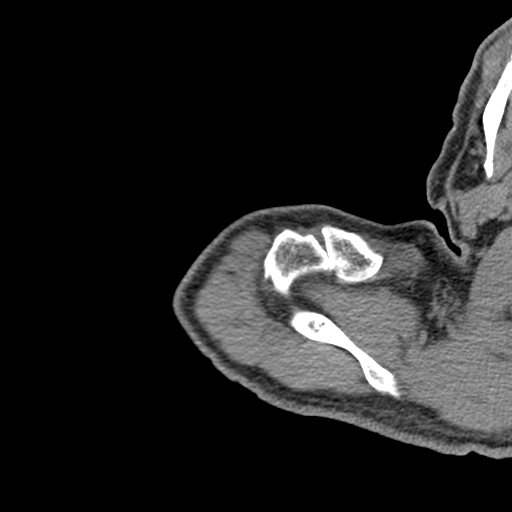
[im 79/103  bone]
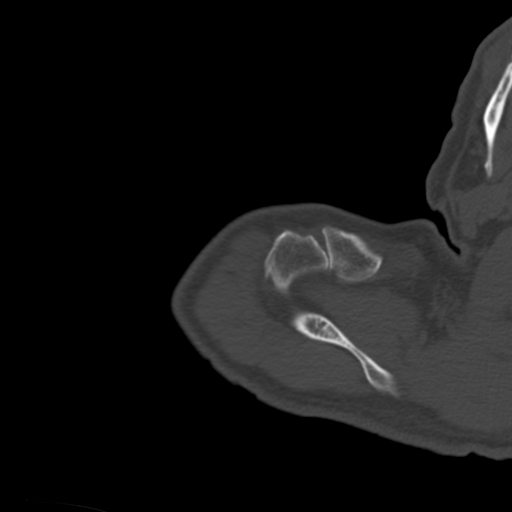
[im 95/103  bone]
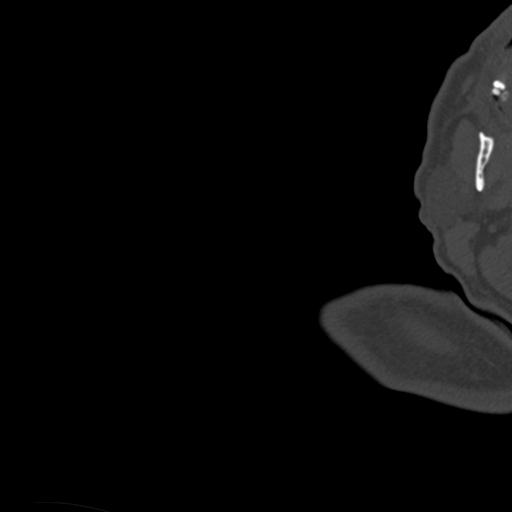

[Series 9: (id) st cor · coronal · 0.37mm/px · 3 of 80 slices shown]
[im 16/80  bone]
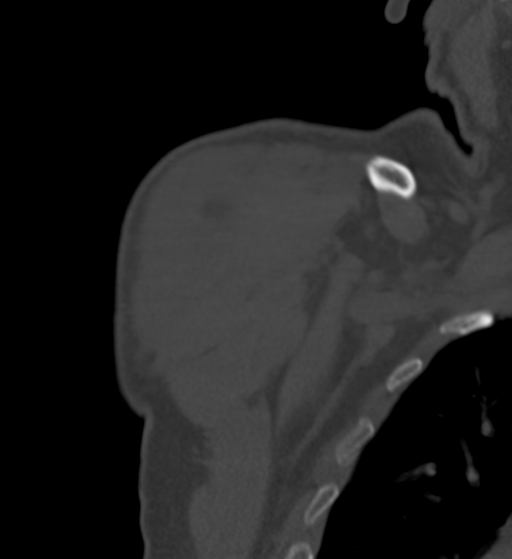
[im 32/80  bone]
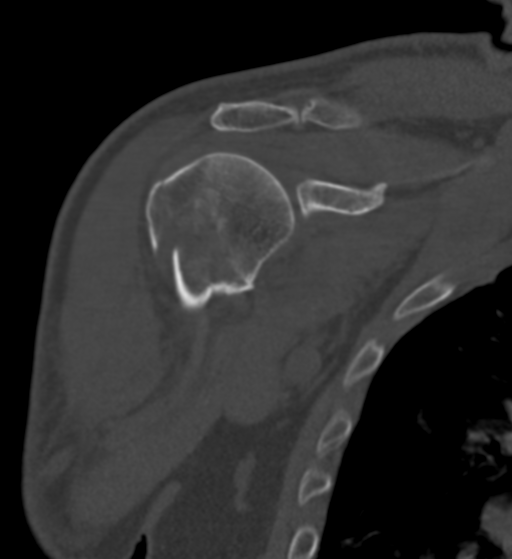
[im 48/80  bone]
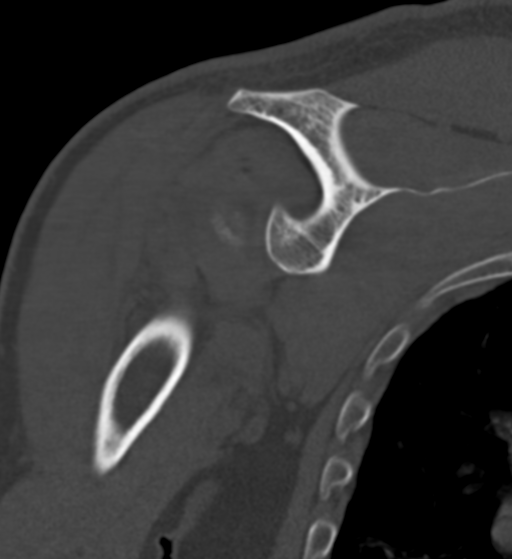

[Series 10: (id) st sag · sagittal · 0.40mm/px · 5 of 101 slices shown, 6 images]
[im 34/101  bone]
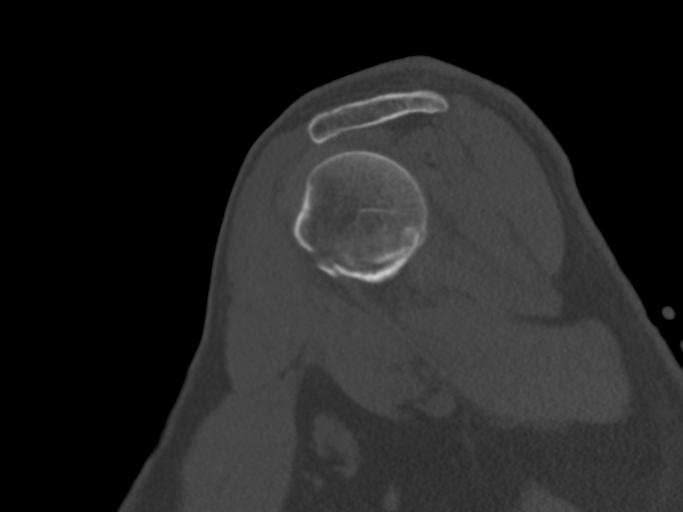
[im 42/101  bone]
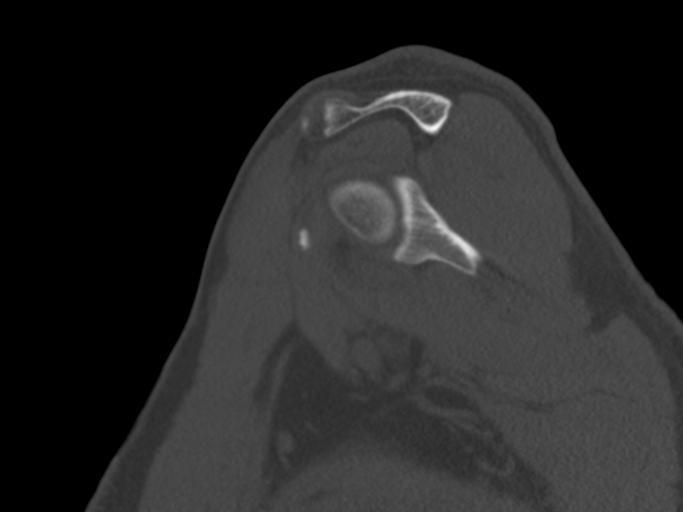
[im 51/101  soft-tissue]
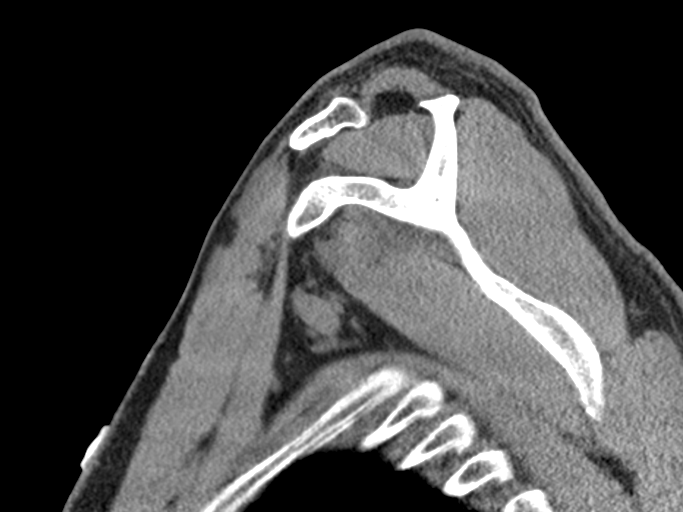
[im 51/101  bone]
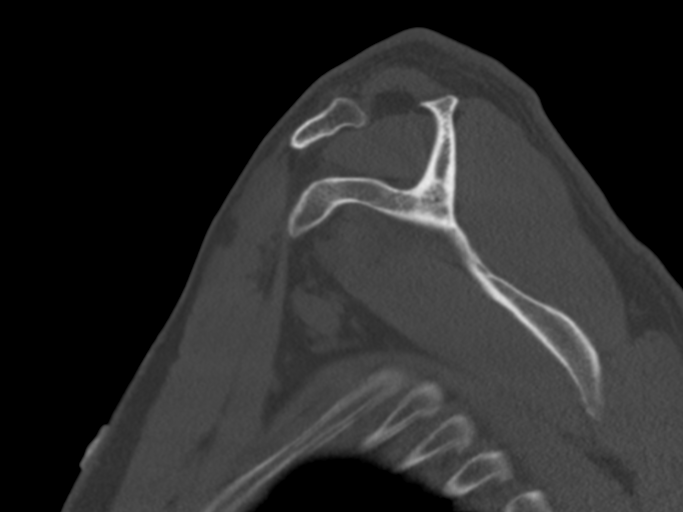
[im 59/101  bone]
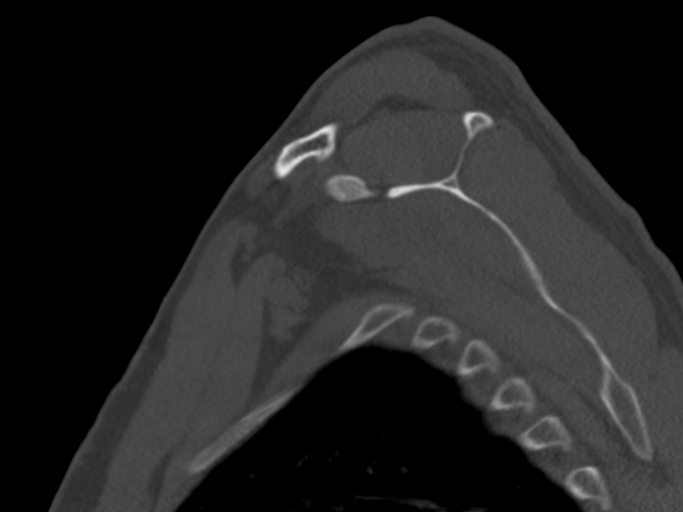
[im 67/101  bone]
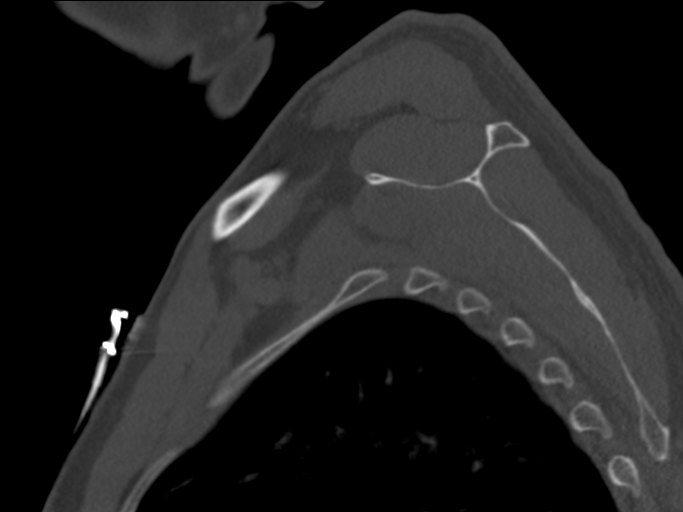

[14 of 33 positions shown; findings below may reference images not displayed]

FINDINGS: Bones/Joint/Cartilage

There is an acute fracture of the a right humeral head. Fracture
planes are seen involving the surgical neck of the humerus with
approximately 2 cortical widths medial displacement of the a shaft
as well as the greater tuberosity which appears mildly comminuted,
but in grossly anatomic alignment. Glenohumeral articulation is
preserved.

The visualized scapula and clavicle are intact. Tiny focus of
calcification is seen adjacent to the coracoid process likely
reflecting the sequela of remote trauma or inflammation involving
the short head biceps insertion. Visualized right ribs appear
intact.

Ligaments

Suboptimally assessed by CT.

Muscles and Tendons

Unremarkable

Soft tissues

Small right shoulder effusion.  Right lower lobe atelectasis noted.
IMPRESSION: Mildly displaced, anatomically aligned three-part fracture of the
right humeral head with fracture planes involving the greater
tuberosity, which appears mildly comminuted, and surgical neck of
the humerus.

## 2022-05-10 ENCOUNTER — Other Ambulatory Visit (INDEPENDENT_AMBULATORY_CARE_PROVIDER_SITE_OTHER): Payer: Self-pay | Admitting: Primary Care

## 2022-05-10 DIAGNOSIS — F5104 Psychophysiologic insomnia: Secondary | ICD-10-CM

## 2022-05-10 NOTE — Telephone Encounter (Signed)
Will forward to provider  

## 2022-06-01 ENCOUNTER — Encounter (INDEPENDENT_AMBULATORY_CARE_PROVIDER_SITE_OTHER): Payer: Self-pay

## 2022-06-14 ENCOUNTER — Other Ambulatory Visit (INDEPENDENT_AMBULATORY_CARE_PROVIDER_SITE_OTHER): Payer: Self-pay | Admitting: Primary Care

## 2022-06-14 DIAGNOSIS — F5104 Psychophysiologic insomnia: Secondary | ICD-10-CM

## 2022-07-05 ENCOUNTER — Ambulatory Visit: Payer: Medicaid Other | Admitting: Internal Medicine

## 2022-07-19 ENCOUNTER — Other Ambulatory Visit (INDEPENDENT_AMBULATORY_CARE_PROVIDER_SITE_OTHER): Payer: Self-pay | Admitting: Primary Care

## 2022-07-19 DIAGNOSIS — F5104 Psychophysiologic insomnia: Secondary | ICD-10-CM

## 2022-08-16 ENCOUNTER — Ambulatory Visit (INDEPENDENT_AMBULATORY_CARE_PROVIDER_SITE_OTHER): Payer: Medicaid Other | Admitting: Internal Medicine

## 2022-08-16 ENCOUNTER — Encounter: Payer: Self-pay | Admitting: Internal Medicine

## 2022-08-16 VITALS — BP 126/82 | HR 82 | Ht 69.0 in | Wt 206.0 lb

## 2022-08-16 DIAGNOSIS — Z760 Encounter for issue of repeat prescription: Secondary | ICD-10-CM | POA: Diagnosis not present

## 2022-08-16 DIAGNOSIS — G63 Polyneuropathy in diseases classified elsewhere: Secondary | ICD-10-CM

## 2022-08-16 DIAGNOSIS — Z794 Long term (current) use of insulin: Secondary | ICD-10-CM | POA: Diagnosis not present

## 2022-08-16 DIAGNOSIS — E1159 Type 2 diabetes mellitus with other circulatory complications: Secondary | ICD-10-CM

## 2022-08-16 DIAGNOSIS — E1142 Type 2 diabetes mellitus with diabetic polyneuropathy: Secondary | ICD-10-CM

## 2022-08-16 DIAGNOSIS — E1165 Type 2 diabetes mellitus with hyperglycemia: Secondary | ICD-10-CM

## 2022-08-16 LAB — POCT GLYCOSYLATED HEMOGLOBIN (HGB A1C): Hemoglobin A1C: 10 % — AB (ref 4.0–5.6)

## 2022-08-16 LAB — POCT GLUCOSE (DEVICE FOR HOME USE): POC Glucose: 196 mg/dl — AB (ref 70–99)

## 2022-08-16 MED ORDER — INSULIN ASPART 100 UNIT/ML FLEXPEN: PEN_INJECTOR | SUBCUTANEOUS | 2 refills | Status: AC

## 2022-08-16 MED ORDER — DAPAGLIFLOZIN PROPANEDIOL 10 MG PO TABS: 10.0000 mg | ORAL_TABLET | Freq: Every day | ORAL | 2 refills | Status: DC

## 2022-08-16 MED ORDER — METFORMIN HCL 1000 MG PO TABS
1000.0000 mg | ORAL_TABLET | Freq: Two times a day (BID) | ORAL | 2 refills | Status: AC
Start: 2022-08-16 — End: ?

## 2022-08-16 MED ORDER — GABAPENTIN 100 MG PO CAPS: 100.0000 mg | ORAL_CAPSULE | Freq: Every day | ORAL | 3 refills | Status: AC

## 2022-08-16 MED ORDER — BASAGLAR KWIKPEN 100 UNIT/ML ~~LOC~~ SOPN: 30.0000 [IU] | PEN_INJECTOR | Freq: Two times a day (BID) | SUBCUTANEOUS | 4 refills | Status: AC

## 2022-08-16 MED ORDER — INSULIN PEN NEEDLE 32G X 4 MM MISC: 1.0000 | Freq: Four times a day (QID) | 3 refills | Status: AC

## 2022-08-16 NOTE — Patient Instructions (Signed)
-   Continue Metformin 1000 mg , 1 tablet daily - Continue farxiga 10 mg daily  - Increase Basaglar  30  units Twice daily  - Continue   Novolog 12 units with each meal  -Novolog correctional insulin: ADD extra units on insulin to your meal-time Novolog dose if your blood sugars are higher than 150. Use the scale below to help guide you:   Blood sugar before meal Number of units to inject  Less than 150 0 unit  151 -  170 1 units  171 -  190 2 units  191 -  210 3 units  211 -  230 4 units  231 -  250 5 units  251 -  270 6 units  271 -  290 7 units  291 -  310 8 units  311 - 330 9 units         HOW TO TREAT LOW BLOOD SUGARS (Blood sugar LESS THAN 70 MG/DL) Please follow the RULE OF 15 for the treatment of hypoglycemia treatment (when your (blood sugars are less than 70 mg/dL)   STEP 1: Take 15 grams of carbohydrates when your blood sugar is low, which includes:  3-4 GLUCOSE TABS  OR 3-4 OZ OF JUICE OR REGULAR SODA OR ONE TUBE OF GLUCOSE GEL    STEP 2: RECHECK blood sugar in 15 MINUTES STEP 3: If your blood sugar is still low at the 15 minute recheck --> then, go back to STEP 1 and treat AGAIN with another 15 grams of carbohydrates.

## 2022-08-16 NOTE — Progress Notes (Signed)
Name: Larry Lewis  Age/ Sex: 57 y.o., male   MRN/ DOB: [id number], 07/21/1965     PCP: Grayce Sessions, NP   Reason for Endocrinology Evaluation: Type 2 Diabetes Mellitus  Initial Endocrine Consultative Visit: 05/13/2020    PATIENT IDENTIFIER: [name] is a 57 y.o. male with a past medical history of T2DM, CAD, Asthma and anxiety . The patient has followed with Endocrinology clinic since 05/13/2020 for consultative assistance with management of his diabetes.  DIABETIC HISTORY:  [name] was diagnosed with DM in 2007,has been on Glipizide in the past, Has been on insulin for yrs due to hyperglycemia. His hemoglobin A1c has ranged from 7.5% in 2022, peaking at 12.2% in 2021.  On his initial visit to our clinic his A1c was 7.5%, we continued Metformin and MDI regimen and started Trulicity. We opted to avoid SGLT-2 inhibitors due to BPH symptoms    Intolerant to Trulicity due to vomiting   SUBJECTIVE:   During the last visit (12/24/2021): A1c 9.1%     Today (08/16/2022): Mr. [name] is here for a follow up on diabetes management.He is accompanied by his friend today.  He checks his blood sugars occasionally .  No meter today.    He was approved for Dexcom 12/2021, but he does not like to use it   Patient had a follow-up with cardiology for CAD/A-fib/CHF 03/2022   Has noted change in vision , tingling of the head   Has noted nocturia but also had noted urinary retension  Has neuropathic symptom with tingling of the feet   HOME DIABETES REGIMEN:  Metformin 1000 mg , BID  Farxiga 10 mg daily  Basaglar 28 units Twice daily  Novolog 12 units with each meal     Statin: yes ACE-I/ARB: yes Prior Diabetic Education: no   METER DOWNLOAD SUMMARY: Did not bring    DIABETIC COMPLICATIONS: Microvascular complications:   Denies: CKD, neuropathy, retinopathy Last eye exam: Completed 11/27/2020   Macrovascular complications:  CAD ( S/P PCI) Denies:  PVD, CVA   HISTORY:   Past Medical History:  Past Medical History:  Diagnosis Date   Anxiety    Asthma    Chronic combined systolic and diastolic CHF (congestive heart failure) (HCC)    mildly reduced EF of 45-50% on Echo in 04/2021   Coronary artery disease    s/p 2 stents to RCA in 2007 in [place], [place]   Fall at home, initial encounter 03/24/2020   Hyperlipidemia    Hypertension    Paroxysmal atrial fibrillation (HCC)    Tobacco abuse    Type 2 diabetes mellitus (HCC)    Past Surgical History:  Past Surgical History:  Procedure Laterality Date   CARDIAC CATHETERIZATION     RIGHT/LEFT HEART CATH AND CORONARY ANGIOGRAPHY N/A 07/09/2019   Procedure: RIGHT/LEFT HEART CATH AND CORONARY ANGIOGRAPHY;  Surgeon: Lennette Bihari, MD;  Location: [facility];  Service: Cardiovascular;  Laterality: N/A;   Social History:  reports that he has been smoking cigarettes. He has a 0.50 pack-year smoking history. He has never used smokeless tobacco. He reports that he does not drink alcohol and does not use drugs. Family History:  Family History  Problem Relation Age of Onset   Other Neg Hx      HOME MEDICATIONS: Allergies as of 08/16/2022       Reactions   Penicillins Itching        Medication List        Accurate  as of Aug 16, 2022  3:13 PM. If you have any questions, ask your nurse or doctor.          acetaminophen 325 MG tablet Commonly known as: TYLENOL Take 2 tablets (650 mg total) by mouth every 4 (four) hours as needed for headache or mild pain.   apixaban 5 MG Tabs tablet Commonly known as: ELIQUIS Take 1 tablet (5 mg total) by mouth 2 (two) times daily.   aspirin EC 81 MG tablet Take 1 tablet (81 mg total) by mouth daily. Swallow whole.   atorvastatin 80 MG tablet Commonly known as: LIPITOR TAKE 1 TABLET (80 MG TOTAL) BY MOUTH DAILY.   Basaglar KwikPen 100 UNIT/ML Inject 28 Units into the skin 2 (two) times daily.   busPIRone 10 MG tablet Commonly known as: BUSPAR Take 10  mg by mouth 2 (two) times daily.   dapagliflozin propanediol 10 MG Tabs tablet Commonly known as: FARXIGA Take 1 tablet (10 mg total) by mouth daily.   Dexcom G6 Sensor Misc 1 Device by Does not apply route as directed.   Dexcom G6 Transmitter Misc 1 Device by Does not apply route as directed.   Entresto 24-26 MG Generic drug: sacubitril-valsartan Take 1 tablet by mouth 2 (two) times daily.   ezetimibe 10 MG tablet Commonly known as: ZETIA Take 1 tablet (10 mg total) by mouth daily.   furosemide 40 MG tablet Commonly known as: LASIX Take 1 tablet (40 mg total) by mouth daily.   guaiFENesin 600 MG 12 hr tablet Commonly known as: MUCINEX Take 1,200 mg by mouth 2 (two) times daily as needed for to loosen phlegm.   insulin aspart 100 UNIT/ML FlexPen Commonly known as: NOVOLOG Max daily 50 units   Insulin Pen Needle 29G X Misc 1 Device by Does not apply route in the morning, at noon, in the evening, and at bedtime.   isosorbide dinitrate 30 MG tablet Commonly known as: ISORDIL Take 30 mg by mouth 4 (four) times daily.   isosorbide mononitrate 30 MG 24 hr tablet Commonly known as: IMDUR Take 1 tablet (30 mg total) by mouth daily.   metFORMIN 1000 MG tablet Commonly known as: GLUCOPHAGE Take 1 tablet (1,000 mg total) by mouth 2 (two) times daily with a meal.   metoprolol 200 MG 24 hr tablet Commonly known as: Toprol XL Take 1 tablet (200 mg total) by mouth daily. Take with or immediately following a meal.   nitroGLYCERIN 0.4 MG SL tablet Commonly known as: NITROSTAT PLACE 1 TABLET (0.4 MG TOTAL) UNDER THE TONGUE EVERY 5 (FIVE) MINUTES X 3 DOSES AS NEEDED FOR CHEST PAIN.   omeprazole 20 MG capsule Commonly known as: PRILOSEC Take 1 capsule (20 mg total) by mouth daily.   potassium chloride SA 20 MEQ tablet Commonly known as: KLOR-CON M Take 1 tablet (20 mEq total) by mouth daily.   spironolactone 25 MG tablet Commonly known as: ALDACTONE Take 1 tablet (25  mg total) by mouth daily.   traZODone 50 MG tablet Commonly known as: DESYREL TAKE 1/2 TO 1 TABLET BY MOUTH AT BEDTIME AS NEEDED FOR SLEEP         OBJECTIVE:   Vital Signs: Ht 5\' 9"  (1.753 m)   Wt 206 lb (93.4 kg)   BMI 30.42 kg/m   Wt Readings from Last 3 Encounters:  08/16/22 206 lb (93.4 kg)  03/29/22 210 lb 9.6 oz (95.5 kg)  12/24/21 209 lb (94.8 kg)     Exam:  General: Pt appears well and is in NAD  Lungs: Clear with good BS bilat   Heart: RRR   Abdomen: Soft, nontender  Extremities: No pretibial edema.   Neuro: MS is good with appropriate affect, pt is alert and Ox3   DM Foot Exam 08/17/2022  The skin of the feet is intact without sores or ulcerations. The pedal pulses are 1+ on right and 1+ on left. The sensation is decreased to a screening 5.07, 10 gram monofilament bilaterally      DATA REVIEWED:  Lab Results  Component Value Date   HGBA1C 9.1 (A) 12/24/2021   HGBA1C 9.4 (H) 05/05/2021   HGBA1C 8.0 (A) 08/12/2020      Latest Reference Range & Units 03/29/22 10:18  Sodium 134 - 144 mmol/L 140  Potassium 3.5 - 5.2 mmol/L 4.3  Chloride 96 - 106 mmol/L 102  CO2 20 - 29 mmol/L 22  Glucose 70 - 99 mg/dL 161 (H)  BUN 6 - 24 mg/dL 18  Creatinine 0.96 - 0.45 mg/dL 4.09  Calcium 8.7 - 81.1 mg/dL 9.7  BUN/Creatinine Ratio 9 - 20  17  eGFR >59 mL/min/1.73 79  Alkaline Phosphatase 44 - 121 IU/L 64  Albumin 3.8 - 4.9 g/dL 4.9  Albumin/Globulin Ratio 1.2 - 2.2  2.0  AST 0 - 40 IU/L 20  ALT 0 - 44 IU/L 38  Total Protein 6.0 - 8.5 g/dL 7.4  Total Bilirubin 0.0 - 1.2 mg/dL 0.2  (H): Data is abnormally high  In office BG 196 mg/dL    ASSESSMENT / PLAN / RECOMMENDATIONS:   1) Type 2 Diabetes Mellitus, Poorly controlled, With neuropathic and macrovascular complications - Most recent A1c of 10.0 %. Goal A1c < 7.0 %.    -Patient continues with poorly controlled diabetes, it is unclear to me the reasons for hyperglycemia as he does not bring his meter nor  does he check his glucose on a regular basis, I suspect lack of appropriate prandial coverage -He developed vomiting with Trulicity, I offered Ozempic today, but he does not want any medication that would cause weight loss -I will increase his basal  insulin as below -He had tried the Dexcom, but does not like to use it anymore -I did encourage the patient to check glucose 3 times daily -He was encouraged to use the correction scale 3 times a day, but only take NovoLog 12 units with meal -Patient advised that he could take his NovoLog pen in his pocket and can stay at room temperature for 20 days, and to keep the new and (in the refrigerator, as he tends to take his prandial insulin after the meal -We again discussed the risk of microvascular complications with uncontrolled diabetes    MEDICATIONS: -Continue Metformin 1000 mg , 1 tablet daily -Continue Farxiga 10 mg daily -Increase  Basaglar  30 units Twice daily  -Continue  Novolog 12 units with each meal  - Start Correction Scale : Novolog (BG-130/20) TID and QHS   EDUCATION / INSTRUCTIONS: BG monitoring instructions: Patient is instructed to check his blood sugars 3 times a day, before meals. Call [facility] if: BG persistently < 70  I reviewed the Rule of 15 for the treatment of hypoglycemia in detail with the patient. Literature supplied.   2) Diabetic complications:  Eye: Does not have known diabetic retinopathy.  Neuro/ Feet: Does not have known diabetic peripheral neuropathy .  Renal: Patient does not have known baseline CKD. He   is on an  ACEI/ARB at present.   3)  Dyslipidemia:     -Per cardiology    4) Peripheral Neuropathy:  -Will refer to podiatry as the patient is scared to cut his toenails due to risk of infection -I have started him on gabapentin to be taken at bedtime, caution against drowsiness  Medication Start gabapentin 100 mg nightly   F/U in 6 months   Signed electronically  by: Lyndle Herrlich, MD  [facility] End[facility]  [facility] [address] [place] [place], [place] [address] Phone: [phone] FAX: [phone]   CC: Grayce Sessions, NP [address] [place] [address] Phone: [phone]  Fax: [phone]  Return to Endocrinology clinic as below: Future Appointments  Date Time Provider Department Center  08/17/2022 11:20 AM Monge, Petra Kuba, NP [facility] None

## 2022-08-17 ENCOUNTER — Encounter: Payer: Self-pay | Admitting: Nurse Practitioner

## 2022-08-17 ENCOUNTER — Ambulatory Visit: Payer: Medicaid Other | Attending: Nurse Practitioner | Admitting: Nurse Practitioner

## 2022-08-17 VITALS — BP 120/78 | HR 100 | Ht 69.0 in | Wt 205.6 lb

## 2022-08-17 DIAGNOSIS — Z720 Tobacco use: Secondary | ICD-10-CM

## 2022-08-17 DIAGNOSIS — Z794 Long term (current) use of insulin: Secondary | ICD-10-CM

## 2022-08-17 DIAGNOSIS — I480 Paroxysmal atrial fibrillation: Secondary | ICD-10-CM | POA: Diagnosis not present

## 2022-08-17 DIAGNOSIS — I5042 Chronic combined systolic (congestive) and diastolic (congestive) heart failure: Secondary | ICD-10-CM

## 2022-08-17 DIAGNOSIS — N529 Male erectile dysfunction, unspecified: Secondary | ICD-10-CM

## 2022-08-17 DIAGNOSIS — E785 Hyperlipidemia, unspecified: Secondary | ICD-10-CM

## 2022-08-17 DIAGNOSIS — I350 Nonrheumatic aortic (valve) stenosis: Secondary | ICD-10-CM | POA: Diagnosis not present

## 2022-08-17 DIAGNOSIS — I25118 Atherosclerotic heart disease of native coronary artery with other forms of angina pectoris: Secondary | ICD-10-CM

## 2022-08-17 DIAGNOSIS — G63 Polyneuropathy in diseases classified elsewhere: Secondary | ICD-10-CM | POA: Insufficient documentation

## 2022-08-17 DIAGNOSIS — E1165 Type 2 diabetes mellitus with hyperglycemia: Secondary | ICD-10-CM | POA: Insufficient documentation

## 2022-08-17 DIAGNOSIS — I10 Essential (primary) hypertension: Secondary | ICD-10-CM

## 2022-08-17 DIAGNOSIS — E118 Type 2 diabetes mellitus with unspecified complications: Secondary | ICD-10-CM

## 2022-08-17 DIAGNOSIS — E119 Type 2 diabetes mellitus without complications: Secondary | ICD-10-CM | POA: Insufficient documentation

## 2022-08-17 MED ORDER — NITROGLYCERIN 0.4 MG SL SUBL: SUBLINGUAL_TABLET | SUBLINGUAL | 3 refills | Status: DC | PRN

## 2022-08-17 NOTE — Patient Instructions (Addendum)
Medication Instructions:  Increase Furosemide (Lasix) 40 mg in the morning and 40 mg in the evening for 3 days. Then resume 40 mg daily.  *If you need a refill on your cardiac medications before your next appointment, please call your pharmacy*   Lab Work: NONE ordered at this time of appointment   If you have labs (blood work) drawn today and your tests are completely normal, you will receive your results only by: MyChart Message (if you have MyChart) OR A paper copy in the mail If you have any lab test that is abnormal or we need to change your treatment, we will call you to review the results.   Testing/Procedures: NONE ordered at this time of appointment     Follow-Up: At [facility], you and your health needs are our priority.  As part of our continuing mission to provide you with exceptional heart care, we have created designated Provider Care Teams.  These Care Teams include your primary Cardiologist (physician) and Advanced Practice Providers (APPs -  Physician Assistants and Nurse Practitioners) who all work together to provide you with the care you need, when you need it.  We recommend signing up for the patient portal called "MyChart".  Sign up information is provided on this After Visit Summary.  MyChart is used to connect with patients for Virtual Visits (Telemedicine).  Patients are able to view lab/test results, encounter notes, upcoming appointments, etc.  Non-urgent messages can be sent to your provider as well.   To learn more about what you can do with MyChart, go to [email or web address].    Your next appointment:   1 week(s)  Provider:   Bernadene Person, NP        Other Instructions Referral sent to urology  Heart Failure Education:  Weigh yourself EVERY morning after you go to the bathroom but before you eat or drink anything. Write this number down in a weight log/diary. If you gain 3 pounds overnight or 5 pounds in a week, call the office. Take  your medicines as prescribed. If you have concerns about your medications, please call us before you stop taking them. Eat low salt foods--Limit salt (sodium) to 2000 mg per day. This will help prevent your body from holding onto fluid. Read food labels as many processed foods have a lot of sodium, especially canned goods and prepackaged meats. If you would like some assistance choosing low sodium foods, we would be happy to set you up with a nutritionist. Limit all fluids for the day to less than 2 liters (64 ounces). Fluid includes all drinks, coffee, juice, ice chips, soup, jello, and all other liquids. Stay as active as you can everyday. Staying active will give you more energy and make your muscles stronger. Start with 5 minutes at a time and work your way up to 30 minutes a day. Break up your activities--do some in the morning and some in the afternoon. Start with 3 days per week and work your way up to 5 days as you can.  If you have chest pain, feel short of breath, dizzy, or lightheaded, STOP. If you don't feel better after a short rest, call 911. If you do feel better, call the office to let us know you have symptoms with exercise.  Daily Weight Record It is important to weigh yourself daily. To do this: Make sure you use a reliable scale. Use the same scale each day. Keep this daily weight chart near your scale. Weigh yourself  each morning at the same time after you use the bathroom. Before weighing yourself: Take off your shoes. Make sure you are wearing the same amount of clothing each day. Write down your weight in the spaces on the form. Compare today's weight to yesterday's weight. Bring this form with you to your follow-up visits with your health care provider. Call your health care provider if you have concerns about your weight, including rapid weight gain or loss. Date: ________ Weight: ____________________ Date: ________ Weight: ____________________ Date: ________ Weight:  ____________________ Date: ________ Weight: ____________________ Date: ________ Weight: ____________________ Date: ________ Weight: ____________________ Date: ________ Weight: ____________________ Date: ________ Weight: ____________________ Date: ________ Weight: ____________________ Date: ________ Weight: ____________________ Date: ________ Weight: ____________________ Date: ________ Weight: ____________________ Date: ________ Weight: ____________________ Date: ________ Weight: ____________________ Date: ________ Weight: ____________________ Date: ________ Weight: ____________________ Date: ________ Weight: ____________________ Date: ________ Weight: ____________________ Date: ________ Weight: ____________________ Date: ________ Weight: ____________________ Date: ________ Weight: ____________________ Date: ________ Weight: ____________________ Date: ________ Weight: ____________________ Date: ________ Weight: ____________________ Date: ________ Weight: ____________________ Date: ________ Weight: ____________________ Date: ________ Weight: ____________________ Date: ________ Weight: ____________________ Date: ________ Weight: ____________________ Date: ________ Weight: ____________________ Date: ________ Weight: ____________________ Date: ________ Weight: ____________________ Date: ________ Weight: ____________________ Date: ________ Weight: ____________________ Date: ________ Weight: ____________________ Date: ________ Weight: ____________________ Date: ________ Weight: ____________________ Date: ________ Weight: ____________________ Date: ________ Weight: ____________________ Date: ________ Weight: ____________________ Date: ________ Weight: ____________________ Date: ________ Weight: ____________________ Date: ________ Weight: ____________________ Date: ________ Weight: ____________________ Date: ________ Weight: ____________________ Date: ________ Weight: ____________________ Date: ________  Weight: ____________________ Date: ________ Weight: ____________________ Date: ________ Weight: ____________________ Date: ________ Weight: ____________________ This information is not intended to replace advice given to you by your health care provider. Make sure you discuss any questions you have with your health care provider. Document Revised: 11/10/2020 Document Reviewed: 11/10/2020 [employer] Patient Education  2024 [employer].

## 2022-08-17 NOTE — Progress Notes (Signed)
Office Visit    Patient Name: Larry Lewis Date of Encounter: 08/17/2022  Primary Care Provider:  Grayce Sessions, NP Primary Cardiologist:  Little Ishikawa, MD  Chief Complaint    57 year old male with a history of severe multivessel CAD s/p DES x 2 in 2007 in [place], chronic combined systolic and diastolic heart failure with EF 45 to 50% on most recent echo in 04/2021, paroxysmal atrial fibrillation on Eliquis, hypertension, hyperlipidemia, type 2 diabetes, asthma, and tobacco use who presents for follow-up related to CAD and heart failure.  Past Medical History    Past Medical History:  Diagnosis Date   Anxiety    Asthma    Chronic combined systolic and diastolic CHF (congestive heart failure) (HCC)    mildly reduced EF of 45-50% on Echo in 04/2021   Coronary artery disease    s/p 2 stents to RCA in 2007 in [place], [place]   Fall at home, initial encounter 03/24/2020   Hyperlipidemia    Hypertension    Paroxysmal atrial fibrillation (HCC)    Tobacco abuse    Type 2 diabetes mellitus (HCC)    Past Surgical History:  Procedure Laterality Date   CARDIAC CATHETERIZATION     RIGHT/LEFT HEART CATH AND CORONARY ANGIOGRAPHY N/A 07/09/2019   Procedure: RIGHT/LEFT HEART CATH AND CORONARY ANGIOGRAPHY;  Surgeon: Lennette Bihari, MD;  Location: [facility];  Service: Cardiovascular;  Laterality: N/A;    Allergies  Allergies  Allergen Reactions   Penicillins Itching     Labs/Other Studies Reviewed    The following studies were reviewed today:  Cardiac Studies & Procedures   CARDIAC CATHETERIZATION  CARDIAC CATHETERIZATION 07/09/2019  Narrative  Mid RCA to Dist RCA lesion is 100% stenosed.  Acute Mrg lesion is 90% stenosed.  Ost RCA to Prox RCA lesion is 70% stenosed.  Prox RCA to Mid RCA lesion is 90% stenosed.  1st Mrg lesion is 85% stenosed.  2nd Mrg-1 lesion is 100% stenosed.  2nd Mrg-2 lesion is 100% stenosed.  3rd Mrg lesion is 90%  stenosed.  Dist Cx lesion is 70% stenosed.  Dist LAD lesion is 100% stenosed.  2nd Diag lesion is 95% stenosed.  Mid LAD lesion is 50% stenosed.  1st Diag-2 lesion is 40% stenosed.  1st Diag-1 lesion is 80% stenosed.  Mid Cx to Dist Cx lesion is 30% stenosed.  Severe diffuse multivessel CAD with moderate irregularity of the LAD with 80% proximal diagonal stenosis prior to a previously placed stent with intimal hyperplasia within the stented segment, 50% the stenosis, 95% stenosis in the second diagonal vessel and total occlusion of the apical LAD; left circumflex vessel with large OM1 vessel with 85% stenosis proximal to an aneurysmal segment, total occlusion of the OM 2 vessel with previously placed stent in this vessel and 90 and 70% bifurcation stenoses in the distal circumflex; severely diffusely diseased RCA with distal occlusion.  There is faint filling of a probable small PLA vessel via the left injection.  Normal right heart pressures.  LVEDP 16 mmHg.  RECOMMENDATION: Plan initiate medical therapy since at present he is not on any anti-ischemic medications.  Most vessels are not amenable to intervention; however if patient experiences increasing chest pain symptomatology the large OM1 vessel can potentially undergo intervention with stenting.  Aggressive lipid-lowering therapy with target LDL less than 70.  Optimal blood pressure control.  Smoking cessation is essential.  With diffuse CAD consider DAPT therapy.  Findings Coronary Findings Diagnostic  Dominance: Right  Left Anterior Descending There is moderate diffuse disease throughout the vessel. Mid LAD lesion is 50% stenosed. Dist LAD lesion is 100% stenosed.  First Diagonal Branch 1st Diag-1 lesion is 80% stenosed. 1st Diag-2 lesion is 40% stenosed. The lesion was previously treated.  Second Diagonal Branch 2nd Diag lesion is 95% stenosed.  Left Circumflex Mid Cx to Dist Cx lesion is 30% stenosed. Dist Cx  lesion is 70% stenosed.  First Obtuse Marginal Branch 1st Mrg lesion is 85% stenosed.  Second Obtuse Marginal Branch 2nd Mrg-1 lesion is 100% stenosed. 2nd Mrg-2 lesion is 100% stenosed. The lesion was previously treated.  Third Obtuse Marginal Branch 3rd Mrg lesion is 90% stenosed.  Right Coronary Artery Ost RCA to Prox RCA lesion is 70% stenosed. Prox RCA to Mid RCA lesion is 90% stenosed. Mid RCA to Dist RCA lesion is 100% stenosed.  Acute Marginal Branch Acute Mrg lesion is 90% stenosed.  Intervention  No interventions have been documented.     ECHOCARDIOGRAM  ECHOCARDIOGRAM COMPLETE 05/05/2021  Narrative ECHOCARDIOGRAM REPORT    Patient Name:   Larry Lewis Date of Exam: 05/05/2021 Medical Rec #:  [id number]   Height:       66.0 in Accession #:    [id number]  Weight:       213.0 lb Date of Birth:  [birth year]    BSA:          2.054 m Patient Age:    56 years    BP:           143/103 mmHg Patient Gender: M           HR:           94 bpm. Exam Location:  Inpatient  Procedure: 2D Echo  Indications:    Abnormal EKG  History:        Patient has prior history of Echocardiogram examinations, most recent 03/25/2020. CHF, Acute MI and CAD; Risk Factors:Hypertension.  Sonographer:    Devonne Doughty Referring Phys: [id number] CARRIEL T NIPP  IMPRESSIONS   1. Left ventricular ejection fraction, by estimation, is 45 to 50%. The left ventricle has mildly decreased function. The left ventricle demonstrates regional wall motion abnormalities (see scoring diagram/findings for description). Left ventricular diastolic parameters are consistent with Grade I diastolic dysfunction (impaired relaxation). There is akinesis of the left ventricular, basal-mid inferior wall and inferolateral wall. 2. Right ventricular systolic function is normal. The right ventricular size is normal. 3. Left atrial size was mildly dilated. 4. The mitral valve is normal in structure. Trivial mitral valve  regurgitation. No evidence of mitral stenosis. 5. The aortic valve is tricuspid. There is moderate calcification of the aortic valve. Aortic valve regurgitation is trivial. Mild aortic valve stenosis. Aortic valve area, by VTI measures 1.76 cm. Aortic valve mean gradient measures 11.0 mmHg. Aortic valve Vmax measures 2.22 m/s. 6. The inferior vena cava is normal in size with greater than 50% respiratory variability, suggesting right atrial pressure of 3 mmHg.  FINDINGS Left Ventricle: Left ventricular ejection fraction, by estimation, is 45 to 50%. The left ventricle has mildly decreased function. The left ventricle demonstrates regional wall motion abnormalities. The left ventricular internal cavity size was normal in size. There is no left ventricular hypertrophy. Left ventricular diastolic parameters are consistent with Grade I diastolic dysfunction (impaired relaxation).  Right Ventricle: The right ventricular size is normal. No increase in right ventricular wall thickness. Right ventricular systolic function is normal.  Left Atrium: Left atrial  size was mildly dilated.  Right Atrium: Right atrial size was normal in size.  Pericardium: There is no evidence of pericardial effusion.  Mitral Valve: The mitral valve is normal in structure. Trivial mitral valve regurgitation. No evidence of mitral valve stenosis.  Tricuspid Valve: The tricuspid valve is normal in structure. Tricuspid valve regurgitation is trivial. No evidence of tricuspid stenosis.  Aortic Valve: The aortic valve is tricuspid. There is moderate calcification of the aortic valve. Aortic valve regurgitation is trivial. Mild aortic stenosis is present. Aortic valve mean gradient measures 11.0 mmHg. Aortic valve peak gradient measures 19.7 mmHg. Aortic valve area, by VTI measures 1.76 cm.  Pulmonic Valve: The pulmonic valve was grossly normal. Pulmonic valve regurgitation is trivial. No evidence of pulmonic stenosis.  Aorta:  The aortic root is normal in size and structure.  Venous: The inferior vena cava is normal in size with greater than 50% respiratory variability, suggesting right atrial pressure of 3 mmHg.  IAS/Shunts: No atrial level shunt detected by color flow Doppler.   LEFT VENTRICLE PLAX 2D LVIDd:         4.28 cm   Diastology LVIDs:         3.47 cm   LV e' medial:    4.35 cm/s LV PW:         0.99 cm   LV E/e' medial:  15.8 LV IVS:        0.99 cm   LV e' lateral:   7.51 cm/s LVOT diam:     2.40 cm   LV E/e' lateral: 9.2 LV SV:         67 LV SV Index:   33 LVOT Area:     4.52 cm   RIGHT VENTRICLE RV Basal diam:  3.34 cm RV Mid diam:    3.45 cm RV S prime:     11.60 cm/s TAPSE (M-mode): 1.9 cm  LEFT ATRIUM             Index        RIGHT ATRIUM           Index LA diam:        3.80 cm 1.85 cm/m   RA Area:     17.10 cm 8.33 cm/m LA Vol (A2C):   54.3 ml 26.44 ml/m LA Vol (A4C):   52.8 ml 25.71 ml/m LA Biplane Vol: 54.4 ml 26.49 ml/m AORTIC VALVE AV Area (Vmax):    1.77 cm AV Area (Vmean):   1.72 cm AV Area (VTI):     1.76 cm AV Vmax:           221.67 cm/s AV Vmean:          156.000 cm/s AV VTI:            0.380 m AV Peak Grad:      19.7 mmHg AV Mean Grad:      11.0 mmHg LVOT Vmax:         86.70 cm/s LVOT Vmean:        59.300 cm/s LVOT VTI:          0.148 m LVOT/AV VTI ratio: 0.39  AORTA Ao Root diam: 3.40 cm Ao Asc diam:  3.30 cm  MITRAL VALVE MV Area (PHT): 3.06 cm     SHUNTS MV Decel Time: 248 msec     Systemic VTI:  0.15 m MV E velocity: 68.90 cm/s   Systemic Diam: 2.40 cm MV A velocity: 102.00 cm/s MV E/A ratio:  0.68  Arvilla Meres MD Electronically signed by Arvilla Meres MD Signature Date/Time: 05/05/2021/6:57:58 PM    Final    MONITORS  CARDIAC EVENT MONITOR 06/08/2020  Narrative  Episode of NSVT lasting 6 seconds (15 beats)  Predominant rhythm is sinus rhythm. Range is 68-133 bpm with average of 92 bpm. No atrial fibrillation, sustained  ventricular tachycardia, significant pause, or high degree AV block.  NSVT x6 seconds.  Total ectopy <1%. 5 patient triggered events, which corresponded to sinus rhythm.          Recent Labs: 03/29/2022: ALT 38; BUN 18; Creatinine, Ser 1.09; Hemoglobin 15.1; Platelets 292; Potassium 4.3; Sodium 140  Recent Lipid Panel    Component Value Date/Time   CHOL 182 03/29/2022 1018   TRIG 302 (H) 03/29/2022 1018   HDL 27 (L) 03/29/2022 1018   CHOLHDL 6.7 (H) 03/29/2022 1018   CHOLHDL 8.1 07/07/2019 0336   VLDL 29 07/07/2019 0336   LDLCALC 103 (H) 03/29/2022 1018    History of Present Illness    57 year old male with the above past medical history including severe multivessel CAD s/p DES x 2 in 2007 in [place], chronic combined systolic and diastolic heart failure with EF 45 to 50% on most recent echo in 04/2021, paroxysmal atrial fibrillation on Eliquis, hypertension, hyperlipidemia, type 2 diabetes, asthma, and tobacco use.  He has a longstanding history of CAD s/p stenting x 2 in 2007 in [place].  He was hospitalized in April 2021 in the setting of acute CHF.  Echocardiogram at that time showed EF 35 to 40% with hypokinesis of the anterior wall, anterolateral wall, anterior septum.  Posterior wall, mid inferior septal segment, and basal inferior septal segment.  R/LHC showed severe diffuse multivessel CAD (see report above).  Most vessels were not amenable to intervention.  Medical therapy was recommended at that time.  He was hospitalized again in January 2022 after presenting with possible syncope and fall leading to humerus fracture.  Orthostatics were negative.  Repeat echocardiogram showed EF 45 to 50%, G1 DD, normal RV function.  He declined surgery for his fracture.  Outpatient cardiac monitor showed short runs of NSVT, no other significant arrhythmias.  He declined loop recorder placement.  He was diagnosed with paroxysmal atrial fibrillation during a hospitalization in February  2023.  He spontaneously converted to sinus rhythm.  He has been maintained on metoprolol and Eliquis.  Echo at the time showed EF 45 to 50%, mildly decreased LV function, G1 DD, normal RV systolic function, mild aortic stenosis, mean gradient 11 mmHg.  He was last seen in office on 03/29/2022 and was stable from a cardiac standpoint.  He noted a 3-week history of intermittent sharp chest pain, tenderness to palpation, however, he did note improvement with nitroglycerin.  He also noted dyspnea on exertion, occasional lightheadedness but no syncopal episodes.  He did not tolerate increased dose of Imdur due to headaches.  He was advised to continue as needed nitroglycerin.  He presents today for follow-up accompanied by his friend.  Patient declined in person interpreter (friend is assisting in the interpretation per pt request). Since his last visit he has been stable from a cardiac standpoint.  He returned from [place] approximately 2 weeks ago.  He notes that while he was on his trip he had increased shortness of breath with exertion, orthopnea, ongoing intermittent chest discomfort.  He has used nitroglycerin with some relief.  He also thinks that he is "holding onto fluid."  He notes  occasional lightheadedness, occasional palpitations, denies any presyncope, syncope.  He notes that he had previously discussed with Dr. Bjorn Pippin a referral to urology  for the evaluation of ED, however, he has not received any information about this. He notes he is still interested in a referral.   Home Medications    Current Outpatient Medications  Medication Sig Dispense Refill   apixaban (ELIQUIS) 5 MG TABS tablet Take 1 tablet (5 mg total) by mouth 2 (two) times daily. 180 tablet 3   aspirin EC 81 MG tablet Take 1 tablet (81 mg total) by mouth daily. Swallow whole. 90 tablet 3   atorvastatin (LIPITOR) 80 MG tablet TAKE 1 TABLET (80 MG TOTAL) BY MOUTH DAILY. 90 tablet 3   busPIRone (BUSPAR) 10 MG tablet Take 5 mg by mouth  2 (two) times daily.     dapagliflozin propanediol (FARXIGA) 10 MG TABS tablet Take 1 tablet (10 mg total) by mouth daily. 90 tablet 2   ezetimibe (ZETIA) 10 MG tablet Take 1 tablet (10 mg total) by mouth daily. 90 tablet 3   furosemide (LASIX) 40 MG tablet Take 1 tablet (40 mg total) by mouth daily. (Patient taking differently: Take 40 mg by mouth daily. 40 mg twice daily for 3 days, then resume 40 mg daily.) 90 tablet 3   gabapentin (NEURONTIN) 100 MG capsule Take 1 capsule (100 mg total) by mouth at bedtime. 90 capsule 3   insulin aspart (NOVOLOG) 100 UNIT/ML FlexPen Max daily 50 units 45 mL 2   Insulin Glargine (BASAGLAR KWIKPEN) 100 UNIT/ML Inject 30 Units into the skin 2 (two) times daily. 60 mL 4   Insulin Pen Needle 29G X MISC 1 Device by Does not apply route in the morning, at noon, in the evening, and at bedtime. 400 each 2   Insulin Pen Needle 32G X 4 MM MISC 1 Device by Does not apply route in the morning, at noon, in the evening, and at bedtime. 400 each 3   isosorbide dinitrate (ISORDIL) 30 MG tablet Take 30 mg by mouth 4 (four) times daily.     isosorbide mononitrate (IMDUR) 30 MG 24 hr tablet Take 1 tablet (30 mg total) by mouth daily. 90 tablet 3   metFORMIN (GLUCOPHAGE) 1000 MG tablet Take 1 tablet (1,000 mg total) by mouth 2 (two) times daily with a meal. 180 tablet 2   metoprolol (TOPROL XL) 200 MG 24 hr tablet Take 1 tablet (200 mg total) by mouth daily. Take with or immediately following a meal. 90 tablet 3   omeprazole (PRILOSEC) 20 MG capsule Take 1 capsule (20 mg total) by mouth daily. 90 capsule 3   potassium chloride SA (KLOR-CON M) 20 MEQ tablet Take 1 tablet (20 mEq total) by mouth daily. 90 tablet 3   sacubitril-valsartan (ENTRESTO) 24-26 MG Take 1 tablet by mouth 2 (two) times daily. 180 tablet 3   spironolactone (ALDACTONE) 25 MG tablet Take 1 tablet (25 mg total) by mouth daily. 90 tablet 3   traZODone (DESYREL) 50 MG tablet TAKE 1/2 TO 1 TABLET BY MOUTH AT  BEDTIME AS NEEDED FOR SLEEP 30 tablet 1   acetaminophen (TYLENOL) 325 MG tablet Take 2 tablets (650 mg total) by mouth every 4 (four) hours as needed for headache or mild pain. (Patient not taking: Reported on 08/17/2022)     Continuous Blood Gluc Sensor (DEXCOM G6 SENSOR) MISC 1 Device by Does not apply route as directed. (Patient not taking: Reported on 08/17/2022) 9 each 2  Continuous Blood Gluc Transmit (DEXCOM G6 TRANSMITTER) MISC 1 Device by Does not apply route as directed. (Patient not taking: Reported on 08/17/2022) 1 each 2   guaiFENesin (MUCINEX) 600 MG 12 hr tablet Take 1,200 mg by mouth 2 (two) times daily as needed for to loosen phlegm. (Patient not taking: Reported on 08/17/2022)     nitroGLYCERIN (NITROSTAT) 0.4 MG SL tablet PLACE 1 TABLET (0.4 MG TOTAL) UNDER THE TONGUE EVERY 5 (FIVE) MINUTES X 3 DOSES AS NEEDED FOR CHEST PAIN. 25 tablet 3   No current facility-administered medications for this visit.     Review of Systems    He denies pnd, n, v, dizziness, syncope, edema, weight gain, or early satiety. All other systems reviewed and are otherwise negative except as noted above.   Physical Exam    VS:  BP 120/78   Pulse 100   Ht 5\' 9"  (1.753 m)   Wt 205 lb 9.6 oz (93.3 kg)   SpO2 93%   BMI 30.36 kg/m   GEN: Well nourished, well developed, in no acute distress. HEENT: normal. Neck: Supple, no JVD, carotid bruits, or masses. Cardiac: RRR, no murmurs, rubs, or gallops. No clubbing, cyanosis, edema.  Radials/DP/PT 2+ and equal bilaterally.  Respiratory:  Respirations regular and unlabored, clear to auscultation bilaterally. GI: Soft, nontender, nondistended, BS + x 4. MS: no deformity or atrophy. Skin: warm and dry, no rash. Neuro:  Strength and sensation are intact. Psych: Normal affect.  Accessory Clinical Findings    ECG personally reviewed by me today - No EKG in office today.   Lab Results  Component Value Date   WBC 10.3 03/29/2022   HGB 15.1 03/29/2022   HCT  47.7 03/29/2022   MCV 77 (L) 03/29/2022   PLT 292 03/29/2022   Lab Results  Component Value Date   CREATININE 1.09 03/29/2022   BUN 18 03/29/2022   NA 140 03/29/2022   K 4.3 03/29/2022   CL 102 03/29/2022   CO2 22 03/29/2022   Lab Results  Component Value Date   ALT 38 03/29/2022   AST 20 03/29/2022   ALKPHOS 64 03/29/2022   BILITOT 0.2 03/29/2022   Lab Results  Component Value Date   CHOL 182 03/29/2022   HDL 27 (L) 03/29/2022   LDLCALC 103 (H) 03/29/2022   TRIG 302 (H) 03/29/2022   CHOLHDL 6.7 (H) 03/29/2022    Lab Results  Component Value Date   HGBA1C 10.0 (A) 08/16/2022    Assessment & Plan   1. CAD: S/p DES x 2 in 2007. R/LHC in 2021 showed severe diffuse multivessel CAD (see report above).  Most vessels were not amenable to intervention.  Medical therapy was recommended at that time.  He has continued to use nitroglycerin in the setting of intermittent chest pain.  He did not tolerate increased Imdur due to significant headaches.   Additionally, could consider addition of amlodipine versus Ranexa. Continue aspirin, metoprolol, Entresto, spironolactone, Imdur, Farxiga, Lipitor, and Zetia.  2. Chronic combined systolic and diastolic heart failure: Echo in 04/2021 showed EF 45 to 50%, G1 DD, normal RV function.  He notes recent progressive dyspnea on exertion, orthopnea abdominal fullness. He denies lower extremity edema, weight gain.  He has commented that he believes he is "holding onto fluid."    Difficult to discern etiology of symptoms.  Generally euvolemic and well compensated on exam.  Will increase Lasix to 80 mg daily x 3 days followed by Lasix 40 mg daily.  Will plan for BMET in 1 week.  He could possibly benefit from increased Entresto if BP tolerates. Continue to monitor symptoms. Continue metoprolol, Entresto, spironolactone, Farxiga, and Lasix.  3. Paroxysmal atrial fibrillation: Maintaining NSR, stable on metoprolol and Eliquis.   4. Aortic stenosis: Mild  on most recent echo in 04/2021.  If shortness of breath persists, consider repeat echo.  5. Hypertension: BP well controlled. Continue current antihypertensive regimen.   6. Hyperlipidemia: LDL was 103 in 03/2022.  Continue Lipitor, Zetia.  Consider repeat labs at next follow-up visit.  If LDL remains elevated above goal, consider referral to lipid clinic Pharm.D.   7. Type 2 diabetes: A1c was 10.0 in 07/2022. Follows with endocrinology.   8. Tobacco use: He continues to smoke.  Full cessation advised.  If dyspnea persist despite optimization of cardiac meds, consider pulmonology referral for evaluation of possible COPD.  9. ED: He is requesting referral to urology, will place referral today.   10. Disposition: Follow-up in 1 week.      Joylene Grapes, NP 08/17/2022, 1:04 PM

## 2022-08-18 ENCOUNTER — Telehealth: Payer: Self-pay

## 2022-08-18 ENCOUNTER — Other Ambulatory Visit: Payer: Self-pay | Admitting: Internal Medicine

## 2022-08-18 NOTE — Telephone Encounter (Signed)
*  ENDO   PA request received via CMM for Dexcom G6 Sensor  PA submitted to [facility] and is pending additional questions/determination  Key: [id number]

## 2022-08-18 NOTE — Telephone Encounter (Signed)
PA has been DENIED due to patient not showing compliance to usage. Patient states he does not use regularly and does not like using the Dexcom.

## 2022-08-25 ENCOUNTER — Other Ambulatory Visit (HOSPITAL_COMMUNITY): Payer: Self-pay

## 2022-08-25 ENCOUNTER — Encounter: Payer: Self-pay | Admitting: Nurse Practitioner

## 2022-08-25 ENCOUNTER — Ambulatory Visit: Payer: Medicaid Other | Attending: Nurse Practitioner | Admitting: Nurse Practitioner

## 2022-08-25 VITALS — BP 118/60 | HR 85 | Ht 68.0 in | Wt 206.2 lb

## 2022-08-25 DIAGNOSIS — I480 Paroxysmal atrial fibrillation: Secondary | ICD-10-CM

## 2022-08-25 DIAGNOSIS — I5042 Chronic combined systolic (congestive) and diastolic (congestive) heart failure: Secondary | ICD-10-CM | POA: Diagnosis not present

## 2022-08-25 DIAGNOSIS — E785 Hyperlipidemia, unspecified: Secondary | ICD-10-CM

## 2022-08-25 DIAGNOSIS — Z794 Long term (current) use of insulin: Secondary | ICD-10-CM

## 2022-08-25 DIAGNOSIS — I2510 Atherosclerotic heart disease of native coronary artery without angina pectoris: Secondary | ICD-10-CM

## 2022-08-25 DIAGNOSIS — R062 Wheezing: Secondary | ICD-10-CM | POA: Diagnosis not present

## 2022-08-25 DIAGNOSIS — E118 Type 2 diabetes mellitus with unspecified complications: Secondary | ICD-10-CM

## 2022-08-25 DIAGNOSIS — R0602 Shortness of breath: Secondary | ICD-10-CM | POA: Diagnosis not present

## 2022-08-25 DIAGNOSIS — I10 Essential (primary) hypertension: Secondary | ICD-10-CM

## 2022-08-25 DIAGNOSIS — I350 Nonrheumatic aortic (valve) stenosis: Secondary | ICD-10-CM

## 2022-08-25 DIAGNOSIS — N529 Male erectile dysfunction, unspecified: Secondary | ICD-10-CM

## 2022-08-25 DIAGNOSIS — Z720 Tobacco use: Secondary | ICD-10-CM

## 2022-08-25 MED ORDER — RANOLAZINE ER 500 MG PO TB12: 500.0000 mg | ORAL_TABLET | Freq: Two times a day (BID) | ORAL | 3 refills | Status: DC

## 2022-08-25 NOTE — Progress Notes (Signed)
Office Visit    Patient Name: Larry Lewis Date of Encounter: 08/25/2022  Primary Care Provider:  Grayce Sessions, NP Primary Cardiologist:  Little Ishikawa, MD  Chief Complaint    57 year old male with a history of severe multivessel CAD s/p DES x 2 in 2007 in [place], chronic combined systolic and diastolic heart failure with EF 45 to 50% on most recent echo in 04/2021, paroxysmal atrial fibrillation on Eliquis, hypertension, hyperlipidemia, type 2 diabetes, asthma, and tobacco use who presents for follow-up related to CAD and heart failure.   Past Medical History    Past Medical History:  Diagnosis Date   Anxiety    Asthma    Chronic combined systolic and diastolic CHF (congestive heart failure) (HCC)    mildly reduced EF of 45-50% on Echo in 04/2021   Coronary artery disease    s/p 2 stents to RCA in 2007 in [place], [place]   Fall at home, initial encounter 03/24/2020   Hyperlipidemia    Hypertension    Paroxysmal atrial fibrillation (HCC)    Tobacco abuse    Type 2 diabetes mellitus (HCC)    Past Surgical History:  Procedure Laterality Date   CARDIAC CATHETERIZATION     RIGHT/LEFT HEART CATH AND CORONARY ANGIOGRAPHY N/A 07/09/2019   Procedure: RIGHT/LEFT HEART CATH AND CORONARY ANGIOGRAPHY;  Surgeon: Lennette Bihari, MD;  Location: [facility];  Service: Cardiovascular;  Laterality: N/A;    Allergies  Allergies  Allergen Reactions   Penicillins Itching     Labs/Other Studies Reviewed    The following studies were reviewed today:  Cardiac Studies & Procedures   CARDIAC CATHETERIZATION  CARDIAC CATHETERIZATION 07/09/2019  Narrative  Mid RCA to Dist RCA lesion is 100% stenosed.  Acute Mrg lesion is 90% stenosed.  Ost RCA to Prox RCA lesion is 70% stenosed.  Prox RCA to Mid RCA lesion is 90% stenosed.  1st Mrg lesion is 85% stenosed.  2nd Mrg-1 lesion is 100% stenosed.  2nd Mrg-2 lesion is 100% stenosed.  3rd Mrg lesion is 90%  stenosed.  Dist Cx lesion is 70% stenosed.  Dist LAD lesion is 100% stenosed.  2nd Diag lesion is 95% stenosed.  Mid LAD lesion is 50% stenosed.  1st Diag-2 lesion is 40% stenosed.  1st Diag-1 lesion is 80% stenosed.  Mid Cx to Dist Cx lesion is 30% stenosed.  Severe diffuse multivessel CAD with moderate irregularity of the LAD with 80% proximal diagonal stenosis prior to a previously placed stent with intimal hyperplasia within the stented segment, 50% the stenosis, 95% stenosis in the second diagonal vessel and total occlusion of the apical LAD; left circumflex vessel with large OM1 vessel with 85% stenosis proximal to an aneurysmal segment, total occlusion of the OM 2 vessel with previously placed stent in this vessel and 90 and 70% bifurcation stenoses in the distal circumflex; severely diffusely diseased RCA with distal occlusion.  There is faint filling of a probable small PLA vessel via the left injection.  Normal right heart pressures.  LVEDP 16 mmHg.  RECOMMENDATION: Plan initiate medical therapy since at present he is not on any anti-ischemic medications.  Most vessels are not amenable to intervention; however if patient experiences increasing chest pain symptomatology the large OM1 vessel can potentially undergo intervention with stenting.  Aggressive lipid-lowering therapy with target LDL less than 70.  Optimal blood pressure control.  Smoking cessation is essential.  With diffuse CAD consider DAPT therapy.  Findings Coronary Findings Diagnostic  Dominance: Right  Left Anterior Descending There is moderate diffuse disease throughout the vessel. Mid LAD lesion is 50% stenosed. Dist LAD lesion is 100% stenosed.  First Diagonal Branch 1st Diag-1 lesion is 80% stenosed. 1st Diag-2 lesion is 40% stenosed. The lesion was previously treated.  Second Diagonal Branch 2nd Diag lesion is 95% stenosed.  Left Circumflex Mid Cx to Dist Cx lesion is 30% stenosed. Dist Cx  lesion is 70% stenosed.  First Obtuse Marginal Branch 1st Mrg lesion is 85% stenosed.  Second Obtuse Marginal Branch 2nd Mrg-1 lesion is 100% stenosed. 2nd Mrg-2 lesion is 100% stenosed. The lesion was previously treated.  Third Obtuse Marginal Branch 3rd Mrg lesion is 90% stenosed.  Right Coronary Artery Ost RCA to Prox RCA lesion is 70% stenosed. Prox RCA to Mid RCA lesion is 90% stenosed. Mid RCA to Dist RCA lesion is 100% stenosed.  Acute Marginal Branch Acute Mrg lesion is 90% stenosed.  Intervention  No interventions have been documented.     ECHOCARDIOGRAM  ECHOCARDIOGRAM COMPLETE 05/05/2021  Narrative ECHOCARDIOGRAM REPORT    Patient Name:   Larry Lewis Date of Exam: 05/05/2021 Medical Rec #:  [id number]   Height:       66.0 in Accession #:    [id number]  Weight:       213.0 lb Date of Birth:  [birth year]    BSA:          2.054 m Patient Age:    56 years    BP:           143/103 mmHg Patient Gender: M           HR:           94 bpm. Exam Location:  Inpatient  Procedure: 2D Echo  Indications:    Abnormal EKG  History:        Patient has prior history of Echocardiogram examinations, most recent 03/25/2020. CHF, Acute MI and CAD; Risk Factors:Hypertension.  Sonographer:    Devonne Doughty Referring Phys: [id number] CARRIEL T NIPP  IMPRESSIONS   1. Left ventricular ejection fraction, by estimation, is 45 to 50%. The left ventricle has mildly decreased function. The left ventricle demonstrates regional wall motion abnormalities (see scoring diagram/findings for description). Left ventricular diastolic parameters are consistent with Grade I diastolic dysfunction (impaired relaxation). There is akinesis of the left ventricular, basal-mid inferior wall and inferolateral wall. 2. Right ventricular systolic function is normal. The right ventricular size is normal. 3. Left atrial size was mildly dilated. 4. The mitral valve is normal in structure. Trivial mitral valve  regurgitation. No evidence of mitral stenosis. 5. The aortic valve is tricuspid. There is moderate calcification of the aortic valve. Aortic valve regurgitation is trivial. Mild aortic valve stenosis. Aortic valve area, by VTI measures 1.76 cm. Aortic valve mean gradient measures 11.0 mmHg. Aortic valve Vmax measures 2.22 m/s. 6. The inferior vena cava is normal in size with greater than 50% respiratory variability, suggesting right atrial pressure of 3 mmHg.  FINDINGS Left Ventricle: Left ventricular ejection fraction, by estimation, is 45 to 50%. The left ventricle has mildly decreased function. The left ventricle demonstrates regional wall motion abnormalities. The left ventricular internal cavity size was normal in size. There is no left ventricular hypertrophy. Left ventricular diastolic parameters are consistent with Grade I diastolic dysfunction (impaired relaxation).  Right Ventricle: The right ventricular size is normal. No increase in right ventricular wall thickness. Right ventricular systolic function is normal.  Left Atrium: Left atrial  size was mildly dilated.  Right Atrium: Right atrial size was normal in size.  Pericardium: There is no evidence of pericardial effusion.  Mitral Valve: The mitral valve is normal in structure. Trivial mitral valve regurgitation. No evidence of mitral valve stenosis.  Tricuspid Valve: The tricuspid valve is normal in structure. Tricuspid valve regurgitation is trivial. No evidence of tricuspid stenosis.  Aortic Valve: The aortic valve is tricuspid. There is moderate calcification of the aortic valve. Aortic valve regurgitation is trivial. Mild aortic stenosis is present. Aortic valve mean gradient measures 11.0 mmHg. Aortic valve peak gradient measures 19.7 mmHg. Aortic valve area, by VTI measures 1.76 cm.  Pulmonic Valve: The pulmonic valve was grossly normal. Pulmonic valve regurgitation is trivial. No evidence of pulmonic stenosis.  Aorta:  The aortic root is normal in size and structure.  Venous: The inferior vena cava is normal in size with greater than 50% respiratory variability, suggesting right atrial pressure of 3 mmHg.  IAS/Shunts: No atrial level shunt detected by color flow Doppler.   LEFT VENTRICLE PLAX 2D LVIDd:         4.28 cm   Diastology LVIDs:         3.47 cm   LV e' medial:    4.35 cm/s LV PW:         0.99 cm   LV E/e' medial:  15.8 LV IVS:        0.99 cm   LV e' lateral:   7.51 cm/s LVOT diam:     2.40 cm   LV E/e' lateral: 9.2 LV SV:         67 LV SV Index:   33 LVOT Area:     4.52 cm   RIGHT VENTRICLE RV Basal diam:  3.34 cm RV Mid diam:    3.45 cm RV S prime:     11.60 cm/s TAPSE (M-mode): 1.9 cm  LEFT ATRIUM             Index        RIGHT ATRIUM           Index LA diam:        3.80 cm 1.85 cm/m   RA Area:     17.10 cm 8.33 cm/m LA Vol (A2C):   54.3 ml 26.44 ml/m LA Vol (A4C):   52.8 ml 25.71 ml/m LA Biplane Vol: 54.4 ml 26.49 ml/m AORTIC VALVE AV Area (Vmax):    1.77 cm AV Area (Vmean):   1.72 cm AV Area (VTI):     1.76 cm AV Vmax:           221.67 cm/s AV Vmean:          156.000 cm/s AV VTI:            0.380 m AV Peak Grad:      19.7 mmHg AV Mean Grad:      11.0 mmHg LVOT Vmax:         86.70 cm/s LVOT Vmean:        59.300 cm/s LVOT VTI:          0.148 m LVOT/AV VTI ratio: 0.39  AORTA Ao Root diam: 3.40 cm Ao Asc diam:  3.30 cm  MITRAL VALVE MV Area (PHT): 3.06 cm     SHUNTS MV Decel Time: 248 msec     Systemic VTI:  0.15 m MV E velocity: 68.90 cm/s   Systemic Diam: 2.40 cm MV A velocity: 102.00 cm/s MV E/A ratio:  0.68  Arvilla Meres MD Electronically signed by Arvilla Meres MD Signature Date/Time: 05/05/2021/6:57:58 PM    Final    MONITORS  CARDIAC EVENT MONITOR 06/08/2020  Narrative  Episode of NSVT lasting 6 seconds (15 beats)  Predominant rhythm is sinus rhythm. Range is 68-133 bpm with average of 92 bpm. No atrial fibrillation, sustained  ventricular tachycardia, significant pause, or high degree AV block.  NSVT x6 seconds.  Total ectopy <1%. 5 patient triggered events, which corresponded to sinus rhythm.          Recent Labs: 03/29/2022: ALT 38; BUN 18; Creatinine, Ser 1.09; Hemoglobin 15.1; Platelets 292; Potassium 4.3; Sodium 140  Recent Lipid Panel    Component Value Date/Time   CHOL 182 03/29/2022 1018   TRIG 302 (H) 03/29/2022 1018   HDL 27 (L) 03/29/2022 1018   CHOLHDL 6.7 (H) 03/29/2022 1018   CHOLHDL 8.1 07/07/2019 0336   VLDL 29 07/07/2019 0336   LDLCALC 103 (H) 03/29/2022 1018    History of Present Illness    57 year old male with the above past medical history including severe multivessel CAD s/p DES x 2 in 2007 in [place], chronic combined systolic and diastolic heart failure with EF 45 to 50% on most recent echo in 04/2021, paroxysmal atrial fibrillation on Eliquis, hypertension, hyperlipidemia, type 2 diabetes, asthma, and tobacco use.   He has a longstanding history of CAD s/p stenting x 2 in 2007 in [place].  He was hospitalized in April 2021 in the setting of acute CHF.  Echocardiogram at that time showed EF 35 to 40% with hypokinesis of the anterior wall, anterolateral wall, anterior septum.  Posterior wall, mid inferior septal segment, and basal inferior septal segment.  R/LHC showed severe diffuse multivessel CAD (see report above).  Most vessels were not amenable to intervention.  Medical therapy was recommended at that time.  He was hospitalized again in January 2022 after presenting with possible syncope and fall leading to humerus fracture.  Orthostatics were negative.  Repeat echocardiogram showed EF 45 to 50%, G1 DD, normal RV function.  He declined surgery for his fracture.  Outpatient cardiac monitor showed short runs of NSVT, no other significant arrhythmias.  He declined loop recorder placement.  He was diagnosed with paroxysmal atrial fibrillation during a hospitalization in February  2023.  He spontaneously converted to sinus rhythm.  He has been maintained on metoprolol and Eliquis.  Echo at the time showed EF 45 to 50%, mildly decreased LV function, G1 DD, normal RV systolic function, mild aortic stenosis, mean gradient 11 mmHg.  At his follow-up visit in January 2024 he noted  intermittent sharp chest pain, tenderness to palpation, however, he did note improvement with nitroglycerin.  He also noted dyspnea on exertion, occasional lightheadedness but no syncopal episodes.  He did not tolerate increased dose of Imdur due to headaches.  He was advised to continue as needed nitroglycerin.  He was last seen in the office on 08/17/2022 and noted increased dyspnea on exertion, orthopnea, intermittent chest discomfort, occasional lightheadedness and palpitations.  Lasix was increased to 80 mg daily x 3 days followed by Lasix 40 mg daily with plans for close follow-up.   He presents today for follow-up accompanied by his friend.  Patient declined in person interpreter (friend is assisting in the interpretation per pt request). Since his last visit he has been stable from a cardiac standpoint.  He does note ongoing dyspnea on exertion, intermittent wheezing as well as mild orthopnea.  He  denies edema, PND, orthopnea, weight gain.  He denies chest pain, palpitations, dizziness, presyncope or syncope.  He also notes interrupted urine stream at times.  He does have follow-up scheduled with urology.    Home Medications    Current Outpatient Medications  Medication Sig Dispense Refill   acetaminophen (TYLENOL) 325 MG tablet Take 2 tablets (650 mg total) by mouth every 4 (four) hours as needed for headache or mild pain.     apixaban (ELIQUIS) 5 MG TABS tablet Take 1 tablet (5 mg total) by mouth 2 (two) times daily. 180 tablet 3   aspirin EC 81 MG tablet Take 1 tablet (81 mg total) by mouth daily. Swallow whole. 90 tablet 3   atorvastatin (LIPITOR) 80 MG tablet TAKE 1 TABLET (80 MG TOTAL) BY MOUTH  DAILY. 90 tablet 3   busPIRone (BUSPAR) 10 MG tablet Take 5 mg by mouth 2 (two) times daily.     Continuous Blood Gluc Sensor (DEXCOM G6 SENSOR) MISC 1 Device by Does not apply route as directed. 9 each 2   Continuous Blood Gluc Transmit (DEXCOM G6 TRANSMITTER) MISC 1 Device by Does not apply route as directed. 1 each 2   dapagliflozin propanediol (FARXIGA) 10 MG TABS tablet Take 1 tablet (10 mg total) by mouth daily. 90 tablet 2   ezetimibe (ZETIA) 10 MG tablet Take 1 tablet (10 mg total) by mouth daily. 90 tablet 3   furosemide (LASIX) 40 MG tablet Take 1 tablet (40 mg total) by mouth daily. (Patient taking differently: Take 40 mg by mouth daily. 40 mg twice daily for 3 days, then resume 40 mg daily.) 90 tablet 3   gabapentin (NEURONTIN) 100 MG capsule Take 1 capsule (100 mg total) by mouth at bedtime. 90 capsule 3   guaiFENesin (MUCINEX) 600 MG 12 hr tablet Take 1,200 mg by mouth 2 (two) times daily as needed for to loosen phlegm.     insulin aspart (NOVOLOG) 100 UNIT/ML FlexPen Max daily 50 units 45 mL 2   Insulin Glargine (BASAGLAR KWIKPEN) 100 UNIT/ML Inject 30 Units into the skin 2 (two) times daily. 60 mL 4   Insulin Pen Needle 29G X MISC 1 Device by Does not apply route in the morning, at noon, in the evening, and at bedtime. 400 each 2   Insulin Pen Needle 32G X 4 MM MISC 1 Device by Does not apply route in the morning, at noon, in the evening, and at bedtime. 400 each 3   isosorbide dinitrate (ISORDIL) 30 MG tablet Take 30 mg by mouth 4 (four) times daily.     isosorbide mononitrate (IMDUR) 30 MG 24 hr tablet Take 1 tablet (30 mg total) by mouth daily. 90 tablet 3   metFORMIN (GLUCOPHAGE) 1000 MG tablet Take 1 tablet (1,000 mg total) by mouth 2 (two) times daily with a meal. 180 tablet 2   metoprolol (TOPROL XL) 200 MG 24 hr tablet Take 1 tablet (200 mg total) by mouth daily. Take with or immediately following a meal. 90 tablet 3   nitroGLYCERIN (NITROSTAT) 0.4 MG SL tablet PLACE 1  TABLET (0.4 MG TOTAL) UNDER THE TONGUE EVERY 5 (FIVE) MINUTES X 3 DOSES AS NEEDED FOR CHEST PAIN. 25 tablet 3   omeprazole (PRILOSEC) 20 MG capsule Take 1 capsule (20 mg total) by mouth daily. 90 capsule 3   potassium chloride SA (KLOR-CON M) 20 MEQ tablet Take 1 tablet (20 mEq total) by mouth daily. 90 tablet 3   sacubitril-valsartan (ENTRESTO) 24-26  MG Take 1 tablet by mouth 2 (two) times daily. 180 tablet 3   spironolactone (ALDACTONE) 25 MG tablet Take 1 tablet (25 mg total) by mouth daily. 90 tablet 3   traZODone (DESYREL) 50 MG tablet TAKE 1/2 TO 1 TABLET BY MOUTH AT BEDTIME AS NEEDED FOR SLEEP 30 tablet 1   No current facility-administered medications for this visit.     Review of Systems    He denies chest pain, palpitations, pnd, n, v, dizziness, syncope, edema, weight gain, or early satiety. All other systems reviewed and are otherwise negative except as noted above.   Physical Exam    VS:  BP 118/60 (BP Location: Right Arm, Patient Position: Sitting, Cuff Size: Normal)   Pulse 85   Ht 5\' 8"  (1.727 m)   Wt 206 lb 3.2 oz (93.5 kg)   SpO2 93%   BMI 31.35 kg/m  GEN: Well nourished, well developed, in no acute distress. HEENT: normal. Neck: Supple, no JVD, carotid bruits, or masses. Cardiac: RRR, no murmurs, rubs, or gallops. No clubbing, cyanosis, edema.  Radials/DP/PT 2+ and equal bilaterally.  Respiratory:  Respirations regular and unlabored, clear to auscultation bilaterally. GI: Soft, nontender, nondistended, BS + x 4. MS: no deformity or atrophy. Skin: warm and dry, no rash. Neuro:  Strength and sensation are intact. Psych: Normal affect.  Accessory Clinical Findings    ECG personally reviewed by me today - No EKG in office today.     Lab Results  Component Value Date   WBC 10.3 03/29/2022   HGB 15.1 03/29/2022   HCT 47.7 03/29/2022   MCV 77 (L) 03/29/2022   PLT 292 03/29/2022   Lab Results  Component Value Date   CREATININE 1.09 03/29/2022   BUN 18  03/29/2022   NA 140 03/29/2022   K 4.3 03/29/2022   CL 102 03/29/2022   CO2 22 03/29/2022   Lab Results  Component Value Date   ALT 38 03/29/2022   AST 20 03/29/2022   ALKPHOS 64 03/29/2022   BILITOT 0.2 03/29/2022   Lab Results  Component Value Date   CHOL 182 03/29/2022   HDL 27 (L) 03/29/2022   LDLCALC 103 (H) 03/29/2022   TRIG 302 (H) 03/29/2022   CHOLHDL 6.7 (H) 03/29/2022    Lab Results  Component Value Date   HGBA1C 10.0 (A) 08/16/2022    Assessment & Plan   1. CAD/dyspnea on exertion: S/p DES x 2 in 2007. R/LHC in 2021 showed severe diffuse multivessel CAD (see report above).  Most vessels were not amenable to intervention.  Medical therapy was recommended at that time.  He has continued to use nitroglycerin in the setting of intermittent chest pain.  He did not tolerate increased Imdur due to significant headaches.  He has had ongoing dyspnea on exertion despite increased Lasix dosing.  Question dyspnea as possible anginal equivalent.  Will trial Ranexa 500 mg twice daily. Will repeat echo given ongoing dyspnea, history of aortic stenosis. Additionally, given recent wheezing and longstanding tobacco use, will refer to pulmonology as underlying COPD could also be contributing to his symptoms. Continue aspirin, metoprolol, Entresto, spironolactone, Imdur, Farxiga, Lipitor, and Zetia.   2. Chronic combined systolic and diastolic heart failure: Echo in 04/2021 showed EF 45 to 50%, G1 DD, normal RV function.  He notes recent progressive dyspnea on exertion, orthopnea and abdominal fullness. He denies lower extremity edema, weight gain.  Symptoms did not improve with increased diuretics.  Difficult to discern etiology of symptoms.  Generally euvolemic and well compensated on exam.  Will check BNP, CMET today.  Repeat echo pending as above. He could possibly benefit from increased Entresto if BP tolerates. Continue to monitor symptoms. Continue metoprolol, Entresto, spironolactone,  Farxiga, and Lasix.   3. Paroxysmal atrial fibrillation: Maintaining NSR, stable on metoprolol and Eliquis.    4. Aortic stenosis: Mild on most recent echo in 04/2021.  Repeat echo pending as above.   5. Hypertension: BP well controlled. Continue current antihypertensive regimen.    6. Hyperlipidemia: LDL was 103 in 03/2022.  Will update fasting lipid panel, CMET.  If LDL remains elevated above goal, consider referral to lipid clinic Pharm.D. Continue Lipitor, Zetia.   7. Type 2 diabetes: A1c was 10.0 in 07/2022. Follows with endocrinology.    8. Tobacco use: He continues to smoke.  Full cessation advised.  Will refer to pulmonology given longstanding tobacco use, dyspnea, and wheezing.   9. ED: He has follow-up scheduled with urology.  10. Disposition: Follow-up in 1 month.     Joylene Grapes, NP 08/25/2022, 3:09 PM

## 2022-08-25 NOTE — Patient Instructions (Addendum)
Medication Instructions:  Start Ranex 500 mg twice daily  *If you need a refill on your cardiac medications before your next appointment, please call your pharmacy*   Lab Work: Fasting lipid panel, CMET, BNP today  Testing/Procedures: Your physician has requested that you have an echocardiogram. Echocardiography is a painless test that uses sound waves to create images of your heart. It provides your doctor with information about the size and shape of your heart and how well your heart's chambers and valves are working. This procedure takes approximately one hour. There are no restrictions for this procedure. Please do NOT wear cologne, perfume, aftershave, or lotions (deodorant is allowed). Please arrive 15 minutes prior to your appointment time.   Follow-Up: At [facility], you and your health needs are our priority.  As part of our continuing mission to provide you with exceptional heart care, we have created designated Provider Care Teams.  These Care Teams include your primary Cardiologist (physician) and Advanced Practice Providers (APPs -  Physician Assistants and Nurse Practitioners) who all work together to provide you with the care you need, when you need it.  We recommend signing up for the patient portal called "MyChart".  Sign up information is provided on this After Visit Summary.  MyChart is used to connect with patients for Virtual Visits (Telemedicine).  Patients are able to view lab/test results, encounter notes, upcoming appointments, etc.  Non-urgent messages can be sent to your provider as well.   To learn more about what you can do with MyChart, go to [email or web address].    Your next appointment:   1 month(s)  Provider:   Bernadene Person, NP        Other Instructions Referral sent to pulmonology.

## 2022-08-26 ENCOUNTER — Encounter: Payer: Self-pay | Admitting: Nurse Practitioner

## 2022-08-26 LAB — COMPREHENSIVE METABOLIC PANEL
ALT: 40 IU/L (ref 0–44)
AST: 25 IU/L (ref 0–40)
Albumin/Globulin Ratio: 1.6 (ref 1.2–2.2)
Albumin: 4.6 g/dL (ref 3.8–4.9)
Alkaline Phosphatase: 61 IU/L (ref 44–121)
BUN/Creatinine Ratio: 16 (ref 9–20)
BUN: 20 mg/dL (ref 6–24)
Bilirubin Total: 0.3 mg/dL (ref 0.0–1.2)
CO2: 23 mmol/L (ref 20–29)
Calcium: 9.6 mg/dL (ref 8.7–10.2)
Chloride: 100 mmol/L (ref 96–106)
Creatinine, Ser: 1.27 mg/dL (ref 0.76–1.27)
Globulin, Total: 2.9 g/dL (ref 1.5–4.5)
Glucose: 122 mg/dL — ABNORMAL HIGH (ref 70–99)
Potassium: 4.7 mmol/L (ref 3.5–5.2)
Sodium: 139 mmol/L (ref 134–144)
Total Protein: 7.5 g/dL (ref 6.0–8.5)
eGFR: 65 mL/min/{1.73_m2} (ref >59)

## 2022-08-26 LAB — BRAIN NATRIURETIC PEPTIDE: BNP: 48.6 pg/mL (ref 0.0–100.0)

## 2022-08-26 LAB — LIPID PANEL
Chol/HDL Ratio: 3.4 ratio (ref 0.0–5.0)
Cholesterol, Total: 104 mg/dL (ref 100–199)
HDL: 31 mg/dL — ABNORMAL LOW (ref >39)
LDL Chol Calc (NIH): 44 mg/dL (ref 0–99)
Triglycerides: 170 mg/dL — ABNORMAL HIGH (ref 0–149)
VLDL Cholesterol Cal: 29 mg/dL (ref 5–40)

## 2022-08-30 ENCOUNTER — Telehealth: Payer: Self-pay

## 2022-08-30 NOTE — Telephone Encounter (Signed)
I spoke with pts friend Gwenyth Bouillon listed on pts HIPAA. He was notified of pts lab results and stated they already viewed the pts lab results on mychart. Pt will continue his current medication and f/u as planned.

## 2022-09-23 ENCOUNTER — Other Ambulatory Visit (INDEPENDENT_AMBULATORY_CARE_PROVIDER_SITE_OTHER): Payer: Self-pay | Admitting: Primary Care

## 2022-09-23 ENCOUNTER — Other Ambulatory Visit: Payer: Self-pay | Admitting: Cardiology

## 2022-09-23 DIAGNOSIS — F5104 Psychophysiologic insomnia: Secondary | ICD-10-CM

## 2022-09-28 ENCOUNTER — Telehealth (HOSPITAL_COMMUNITY): Payer: Self-pay | Admitting: Nurse Practitioner

## 2022-09-28 NOTE — Telephone Encounter (Signed)
Patient cancelled echocardiogram due to he left the country. Order will be removed from the echo WQ. Thank you

## 2022-10-03 ENCOUNTER — Ambulatory Visit (HOSPITAL_COMMUNITY): Payer: Medicaid Other

## 2022-10-07 ENCOUNTER — Other Ambulatory Visit (INDEPENDENT_AMBULATORY_CARE_PROVIDER_SITE_OTHER): Payer: Self-pay | Admitting: Primary Care

## 2022-10-07 DIAGNOSIS — F5104 Psychophysiologic insomnia: Secondary | ICD-10-CM

## 2022-10-14 ENCOUNTER — Encounter: Payer: Self-pay | Admitting: Nurse Practitioner

## 2022-10-14 ENCOUNTER — Ambulatory Visit: Payer: Medicaid Other | Attending: Nurse Practitioner | Admitting: Nurse Practitioner

## 2022-10-14 NOTE — Progress Notes (Deleted)
Office Visit    Patient Name: Larry Lewis Date of Encounter: 10/14/2022  Primary Care Provider:  Grayce Sessions, NP Primary Cardiologist:  Little Ishikawa, MD  Chief Complaint    57 year old male with a history of severe multivessel CAD s/p DES x 2 in 2007 in [place], chronic combined systolic and diastolic heart failure with EF 45 to 50% on most recent echo in 04/2021, paroxysmal atrial fibrillation on Eliquis, hypertension, hyperlipidemia, type 2 diabetes, asthma, and tobacco use who presents for follow-up related to CAD and heart failure.   Past Medical History    Past Medical History:  Diagnosis Date   Anxiety    Asthma    Chronic combined systolic and diastolic CHF (congestive heart failure) (HCC)    mildly reduced EF of 45-50% on Echo in 04/2021   Coronary artery disease    s/p 2 stents to RCA in 2007 in [place], [place]   Fall at home, initial encounter 03/24/2020   Hyperlipidemia    Hypertension    Paroxysmal atrial fibrillation (HCC)    Tobacco abuse    Type 2 diabetes mellitus (HCC)    Past Surgical History:  Procedure Laterality Date   CARDIAC CATHETERIZATION     RIGHT/LEFT HEART CATH AND CORONARY ANGIOGRAPHY N/A 07/09/2019   Procedure: RIGHT/LEFT HEART CATH AND CORONARY ANGIOGRAPHY;  Surgeon: Lennette Bihari, MD;  Location: [facility];  Service: Cardiovascular;  Laterality: N/A;    Allergies  Allergies  Allergen Reactions   Penicillins Itching     Labs/Other Studies Reviewed    The following studies were reviewed today:  Cardiac Studies & Procedures   CARDIAC CATHETERIZATION  CARDIAC CATHETERIZATION 07/09/2019  Narrative  Mid RCA to Dist RCA lesion is 100% stenosed.  Acute Mrg lesion is 90% stenosed.  Ost RCA to Prox RCA lesion is 70% stenosed.  Prox RCA to Mid RCA lesion is 90% stenosed.  1st Mrg lesion is 85% stenosed.  2nd Mrg-1 lesion is 100% stenosed.  2nd Mrg-2 lesion is 100% stenosed.  3rd Mrg lesion is 90%  stenosed.  Dist Cx lesion is 70% stenosed.  Dist LAD lesion is 100% stenosed.  2nd Diag lesion is 95% stenosed.  Mid LAD lesion is 50% stenosed.  1st Diag-2 lesion is 40% stenosed.  1st Diag-1 lesion is 80% stenosed.  Mid Cx to Dist Cx lesion is 30% stenosed.  Severe diffuse multivessel CAD with moderate irregularity of the LAD with 80% proximal diagonal stenosis prior to a previously placed stent with intimal hyperplasia within the stented segment, 50% the stenosis, 95% stenosis in the second diagonal vessel and total occlusion of the apical LAD; left circumflex vessel with large OM1 vessel with 85% stenosis proximal to an aneurysmal segment, total occlusion of the OM 2 vessel with previously placed stent in this vessel and 90 and 70% bifurcation stenoses in the distal circumflex; severely diffusely diseased RCA with distal occlusion.  There is faint filling of a probable small PLA vessel via the left injection.  Normal right heart pressures.  LVEDP 16 mmHg.  RECOMMENDATION: Plan initiate medical therapy since at present he is not on any anti-ischemic medications.  Most vessels are not amenable to intervention; however if patient experiences increasing chest pain symptomatology the large OM1 vessel can potentially undergo intervention with stenting.  Aggressive lipid-lowering therapy with target LDL less than 70.  Optimal blood pressure control.  Smoking cessation is essential.  With diffuse CAD consider DAPT therapy.  Findings Coronary Findings Diagnostic  Dominance: Right  Left Anterior Descending There is moderate diffuse disease throughout the vessel. Mid LAD lesion is 50% stenosed. Dist LAD lesion is 100% stenosed.  First Diagonal Branch 1st Diag-1 lesion is 80% stenosed. 1st Diag-2 lesion is 40% stenosed. The lesion was previously treated.  Second Diagonal Branch 2nd Diag lesion is 95% stenosed.  Left Circumflex Mid Cx to Dist Cx lesion is 30% stenosed. Dist Cx  lesion is 70% stenosed.  First Obtuse Marginal Branch 1st Mrg lesion is 85% stenosed.  Second Obtuse Marginal Branch 2nd Mrg-1 lesion is 100% stenosed. 2nd Mrg-2 lesion is 100% stenosed. The lesion was previously treated.  Third Obtuse Marginal Branch 3rd Mrg lesion is 90% stenosed.  Right Coronary Artery Ost RCA to Prox RCA lesion is 70% stenosed. Prox RCA to Mid RCA lesion is 90% stenosed. Mid RCA to Dist RCA lesion is 100% stenosed.  Acute Marginal Branch Acute Mrg lesion is 90% stenosed.  Intervention  No interventions have been documented.     ECHOCARDIOGRAM  ECHOCARDIOGRAM COMPLETE 05/05/2021  Narrative ECHOCARDIOGRAM REPORT    Patient Name:   AIRICK GLEED Date of Exam: 05/05/2021 Medical Rec #:  [id number]   Height:       66.0 in Accession #:    [id number]  Weight:       213.0 lb Date of Birth:  03/24/1965    BSA:          2.054 m Patient Age:    57 years    BP:           143/103 mmHg Patient Gender: M           HR:           94 bpm. Exam Location:  Inpatient  Procedure: 2D Echo  Indications:    Abnormal EKG  History:        Patient has prior history of Echocardiogram examinations, most recent 03/25/2020. CHF, Acute MI and CAD; Risk Factors:Hypertension.  Sonographer:    Devonne Doughty Referring Phys: [id number] CARRIEL T NIPP  IMPRESSIONS   1. Left ventricular ejection fraction, by estimation, is 45 to 50%. The left ventricle has mildly decreased function. The left ventricle demonstrates regional wall motion abnormalities (see scoring diagram/findings for description). Left ventricular diastolic parameters are consistent with Grade I diastolic dysfunction (impaired relaxation). There is akinesis of the left ventricular, basal-mid inferior wall and inferolateral wall. 2. Right ventricular systolic function is normal. The right ventricular size is normal. 3. Left atrial size was mildly dilated. 4. The mitral valve is normal in structure. Trivial mitral valve  regurgitation. No evidence of mitral stenosis. 5. The aortic valve is tricuspid. There is moderate calcification of the aortic valve. Aortic valve regurgitation is trivial. Mild aortic valve stenosis. Aortic valve area, by VTI measures 1.76 cm. Aortic valve mean gradient measures 11.0 mmHg. Aortic valve Vmax measures 2.22 m/s. 6. The inferior vena cava is normal in size with greater than 50% respiratory variability, suggesting right atrial pressure of 3 mmHg.  FINDINGS Left Ventricle: Left ventricular ejection fraction, by estimation, is 45 to 50%. The left ventricle has mildly decreased function. The left ventricle demonstrates regional wall motion abnormalities. The left ventricular internal cavity size was normal in size. There is no left ventricular hypertrophy. Left ventricular diastolic parameters are consistent with Grade I diastolic dysfunction (impaired relaxation).  Right Ventricle: The right ventricular size is normal. No increase in right ventricular wall thickness. Right ventricular systolic function is normal.  Left Atrium: Left atrial  size was mildly dilated.  Right Atrium: Right atrial size was normal in size.  Pericardium: There is no evidence of pericardial effusion.  Mitral Valve: The mitral valve is normal in structure. Trivial mitral valve regurgitation. No evidence of mitral valve stenosis.  Tricuspid Valve: The tricuspid valve is normal in structure. Tricuspid valve regurgitation is trivial. No evidence of tricuspid stenosis.  Aortic Valve: The aortic valve is tricuspid. There is moderate calcification of the aortic valve. Aortic valve regurgitation is trivial. Mild aortic stenosis is present. Aortic valve mean gradient measures 11.0 mmHg. Aortic valve peak gradient measures 19.7 mmHg. Aortic valve area, by VTI measures 1.76 cm.  Pulmonic Valve: The pulmonic valve was grossly normal. Pulmonic valve regurgitation is trivial. No evidence of pulmonic stenosis.  Aorta:  The aortic root is normal in size and structure.  Venous: The inferior vena cava is normal in size with greater than 50% respiratory variability, suggesting right atrial pressure of 3 mmHg.  IAS/Shunts: No atrial level shunt detected by color flow Doppler.   LEFT VENTRICLE PLAX 2D LVIDd:         4.28 cm   Diastology LVIDs:         3.47 cm   LV e' medial:    4.35 cm/s LV PW:         0.99 cm   LV E/e' medial:  15.8 LV IVS:        0.99 cm   LV e' lateral:   7.51 cm/s LVOT diam:     2.40 cm   LV E/e' lateral: 9.2 LV SV:         67 LV SV Index:   33 LVOT Area:     4.52 cm   RIGHT VENTRICLE RV Basal diam:  3.34 cm RV Mid diam:    3.45 cm RV S prime:     11.60 cm/s TAPSE (M-mode): 1.9 cm  LEFT ATRIUM             Index        RIGHT ATRIUM           Index LA diam:        3.80 cm 1.85 cm/m   RA Area:     17.10 cm 8.33 cm/m LA Vol (A2C):   54.3 ml 26.44 ml/m LA Vol (A4C):   52.8 ml 25.71 ml/m LA Biplane Vol: 54.4 ml 26.49 ml/m AORTIC VALVE AV Area (Vmax):    1.77 cm AV Area (Vmean):   1.72 cm AV Area (VTI):     1.76 cm AV Vmax:           221.67 cm/s AV Vmean:          156.000 cm/s AV VTI:            0.380 m AV Peak Grad:      19.7 mmHg AV Mean Grad:      11.0 mmHg LVOT Vmax:         86.70 cm/s LVOT Vmean:        59.300 cm/s LVOT VTI:          0.148 m LVOT/AV VTI ratio: 0.39  AORTA Ao Root diam: 3.40 cm Ao Asc diam:  3.30 cm  MITRAL VALVE MV Area (PHT): 3.06 cm     SHUNTS MV Decel Time: 248 msec     Systemic VTI:  0.15 m MV E velocity: 68.90 cm/s   Systemic Diam: 2.40 cm MV A velocity: 102.00 cm/s MV E/A ratio:  0.68  Arvilla Meres MD Electronically signed by Arvilla Meres MD Signature Date/Time: 05/05/2021/6:57:58 PM    Final    MONITORS  CARDIAC EVENT MONITOR 05/22/2020  Narrative  Episode of NSVT lasting 6 seconds (15 beats)  Predominant rhythm is sinus rhythm. Range is 68-133 bpm with average of 92 bpm. No atrial fibrillation, sustained  ventricular tachycardia, significant pause, or high degree AV block.  NSVT x6 seconds.  Total ectopy <1%. 5 patient triggered events, which corresponded to sinus rhythm.          Recent Labs: 03/29/2022: Hemoglobin 15.1; Platelets 292 08/25/2022: ALT 40; BNP 48.6; BUN 20; Creatinine, Ser 1.27; Potassium 4.7; Sodium 139  Recent Lipid Panel    Component Value Date/Time   CHOL 104 08/25/2022 1544   TRIG 170 (H) 08/25/2022 1544   HDL 31 (L) 08/25/2022 1544   CHOLHDL 3.4 08/25/2022 1544   CHOLHDL 8.1 07/07/2019 0336   VLDL 29 07/07/2019 0336   LDLCALC 44 08/25/2022 1544    History of Present Illness    57 year old male with the above past medical history including severe multivessel CAD s/p DES x 2 in 2007 in [place], chronic combined systolic and diastolic heart failure with EF 45 to 50% on most recent echo in 04/2021, paroxysmal atrial fibrillation on Eliquis, hypertension, hyperlipidemia, type 2 diabetes, asthma, and tobacco use.   He has a longstanding history of CAD s/p stenting x 2 in 2007 in [place].  He was hospitalized in April 2021 in the setting of acute CHF.  Echocardiogram at that time showed EF 35 to 40% with hypokinesis of the anterior wall, anterolateral wall, anterior septum.  Posterior wall, mid inferior septal segment, and basal inferior septal segment.  R/LHC showed severe diffuse multivessel CAD (see report above).  Most vessels were not amenable to intervention.  Medical therapy was recommended at that time.  He was hospitalized again in January 2022 after presenting with possible syncope and fall leading to humerus fracture.  Orthostatics were negative.  Repeat echocardiogram showed EF 45 to 50%, G1 DD, normal RV function.  He declined surgery for his fracture.  Outpatient cardiac monitor showed short runs of NSVT, no other significant arrhythmias.  He declined loop recorder placement.  He was diagnosed with paroxysmal atrial fibrillation during a hospitalization  in February 2023.  He spontaneously converted to sinus rhythm.  He has been maintained on metoprolol and Eliquis.  Echo at the time showed EF 45 to 50%, mildly decreased LV function, G1 DD, normal RV systolic function, mild aortic stenosis, mean gradient 11 mmHg.  At his follow-up visit in January 2024 he noted  intermittent sharp chest pain, tenderness to palpation, however, he did note improvement with nitroglycerin.  He also noted dyspnea on exertion, occasional lightheadedness but no syncopal episodes.  He did not tolerate increased dose of Imdur due to headaches.  He was advised to continue as needed nitroglycerin.  At his follow-up in May 2024 he noted increased dyspnea on exertion, orthopnea, intermittent chest discomfort.  Lasix was increased. He was last seen in the office on 08/25/2022 and noted ongoing dyspnea on exertion, intermittent wheezing, mild orthopnea.  He was started on Ranexa.  He was referred to pulmonology.  Repeat echocardiogram was ordered and is pending.     He presents today for follow-up accompanied by his friend.  Patient declined in person interpreter (friend is assisting in the interpretation per pt request). Since his last visit he has been stable from a cardiac standpoint.  He does note ongoing dyspnea on exertion, intermittent wheezing as well as mild orthopnea.  He denies edema, PND, orthopnea, weight gain.  He denies chest pain, palpitations, dizziness, presyncope or syncope.  He also notes interrupted urine stream at times.  He does have follow-up scheduled with urology.     1. CAD/dyspnea on exertion: S/p DES x 2 in 2007. R/LHC in 2021 showed severe diffuse multivessel CAD (see report above).  Most vessels were not amenable to intervention.  Medical therapy was recommended at that time.  He has continued to use nitroglycerin in the setting of intermittent chest pain.  He did not tolerate increased Imdur due to significant headaches.  He has had ongoing dyspnea on exertion  despite increased Lasix dosing.  Question dyspnea as possible anginal equivalent.  Will trial Ranexa 500 mg twice daily. Will repeat echo given ongoing dyspnea, history of aortic stenosis. Additionally, given recent wheezing and longstanding tobacco use, will refer to pulmonology as underlying COPD could also be contributing to his symptoms. Continue aspirin, metoprolol, Entresto, spironolactone, Imdur, Farxiga, Lipitor, and Zetia.   2. Chronic combined systolic and diastolic heart failure: Echo in 04/2021 showed EF 45 to 50%, G1 DD, normal RV function.  He notes recent progressive dyspnea on exertion, orthopnea and abdominal fullness. He denies lower extremity edema, weight gain.  Symptoms did not improve with increased diuretics.  Difficult to discern etiology of symptoms.  Generally euvolemic and well compensated on exam.  Will check BNP, CMET today.  Repeat echo pending as above. He could possibly benefit from increased Entresto if BP tolerates. Continue to monitor symptoms. Continue metoprolol, Entresto, spironolactone, Farxiga, and Lasix.   3. Paroxysmal atrial fibrillation: Maintaining NSR, stable on metoprolol and Eliquis.    4. Aortic stenosis: Mild on most recent echo in 04/2021.  Repeat echo pending as above.   5. Hypertension: BP well controlled. Continue current antihypertensive regimen.    6. Hyperlipidemia: LDL was 103 in 03/2022.  Will update fasting lipid panel, CMET.  If LDL remains elevated above goal, consider referral to lipid clinic Pharm.D. Continue Lipitor, Zetia.   7. Type 2 diabetes: A1c was 10.0 in 07/2022. Follows with endocrinology.    8. Tobacco use: He continues to smoke.  Full cessation advised.  Will refer to pulmonology given longstanding tobacco use, dyspnea, and wheezing.   9. ED: He has follow-up scheduled with urology.   10. Disposition: Follow-up in  Home Medications    Current Outpatient Medications  Medication Sig Dispense Refill   acetaminophen (TYLENOL)  325 MG tablet Take 2 tablets (650 mg total) by mouth every 4 (four) hours as needed for headache or mild pain.     apixaban (ELIQUIS) 5 MG TABS tablet Take 1 tablet (5 mg total) by mouth 2 (two) times daily. 180 tablet 3   aspirin EC 81 MG tablet Take 1 tablet (81 mg total) by mouth daily. Swallow whole. 90 tablet 3   atorvastatin (LIPITOR) 80 MG tablet TAKE 1 TABLET (80 MG TOTAL) BY MOUTH DAILY. 90 tablet 3   busPIRone (BUSPAR) 10 MG tablet Take 5 mg by mouth 2 (two) times daily.     Continuous Blood Gluc Sensor (DEXCOM G6 SENSOR) MISC 1 Device by Does not apply route as directed. 9 each 2   Continuous Blood Gluc Transmit (DEXCOM G6 TRANSMITTER) MISC 1 Device by Does not apply route as directed. 1 each 2   dapagliflozin propanediol (FARXIGA) 10 MG TABS tablet Take 1 tablet (10 mg total) by mouth  daily. 90 tablet 2   ezetimibe (ZETIA) 10 MG tablet Take 1 tablet (10 mg total) by mouth daily. 90 tablet 3   furosemide (LASIX) 40 MG tablet Take 1 tablet (40 mg total) by mouth daily. (Patient taking differently: Take 40 mg by mouth daily. 40 mg twice daily for 3 days, then resume 40 mg daily.) 90 tablet 3   gabapentin (NEURONTIN) 100 MG capsule Take 1 capsule (100 mg total) by mouth at bedtime. 90 capsule 3   guaiFENesin (MUCINEX) 600 MG 12 hr tablet Take 1,200 mg by mouth 2 (two) times daily as needed for to loosen phlegm.     insulin aspart (NOVOLOG) 100 UNIT/ML FlexPen Max daily 50 units 45 mL 2   Insulin Glargine (BASAGLAR KWIKPEN) 100 UNIT/ML Inject 30 Units into the skin 2 (two) times daily. 60 mL 4   Insulin Pen Needle 29G X MISC 1 Device by Does not apply route in the morning, at noon, in the evening, and at bedtime. 400 each 2   Insulin Pen Needle 32G X 4 MM MISC 1 Device by Does not apply route in the morning, at noon, in the evening, and at bedtime. 400 each 3   isosorbide dinitrate (ISORDIL) 30 MG tablet Take 30 mg by mouth 4 (four) times daily. (Patient not taking: Reported on 08/25/2022)      isosorbide mononitrate (IMDUR) 30 MG 24 hr tablet Take 1 tablet (30 mg total) by mouth daily. 90 tablet 3   metFORMIN (GLUCOPHAGE) 1000 MG tablet Take 1 tablet (1,000 mg total) by mouth 2 (two) times daily with a meal. 180 tablet 2   metoprolol (TOPROL XL) 200 MG 24 hr tablet Take 1 tablet (200 mg total) by mouth daily. Take with or immediately following a meal. 90 tablet 3   nitroGLYCERIN (NITROSTAT) 0.4 MG SL tablet PLACE 1 TABLET (0.4 MG TOTAL) UNDER THE TONGUE EVERY 5 (FIVE) MINUTES X 3 DOSES AS NEEDED FOR CHEST PAIN. 25 tablet 3   omeprazole (PRILOSEC) 20 MG capsule TAKE 1 CAPSULE BY MOUTH EVERY DAY 90 capsule 3   potassium chloride SA (KLOR-CON M) 20 MEQ tablet Take 1 tablet (20 mEq total) by mouth daily. 90 tablet 3   ranolazine (RANEXA) 500 MG 12 hr tablet Take 1 tablet (500 mg total) by mouth 2 (two) times daily. 60 tablet 3   sacubitril-valsartan (ENTRESTO) 24-26 MG Take 1 tablet by mouth 2 (two) times daily. 180 tablet 3   spironolactone (ALDACTONE) 25 MG tablet Take 1 tablet (25 mg total) by mouth daily. 90 tablet 3   traZODone (DESYREL) 50 MG tablet TAKE 1/2 TO 1 TABLET BY MOUTH AT BEDTIME AS NEEDED FOR SLEEP 30 tablet 1   No current facility-administered medications for this visit.     Review of Systems    ***.  All other systems reviewed and are otherwise negative except as noted above.    Physical Exam    VS:  There were no vitals taken for this visit. , BMI There is no height or weight on file to calculate BMI.     GEN: Well nourished, well developed, in no acute distress. HEENT: normal. Neck: Supple, no JVD, carotid bruits, or masses. Cardiac: RRR, no murmurs, rubs, or gallops. No clubbing, cyanosis, edema.  Radials/DP/PT 2+ and equal bilaterally.  Respiratory:  Respirations regular and unlabored, clear to auscultation bilaterally. GI: Soft, nontender, nondistended, BS + x 4. MS: no deformity or atrophy. Skin: warm and dry, no rash. Neuro:  Strength  and sensation  are intact. Psych: Normal affect.  Accessory Clinical Findings    ECG personally reviewed by me today -    - no acute changes.   Lab Results  Component Value Date   WBC 10.3 03/29/2022   HGB 15.1 03/29/2022   HCT 47.7 03/29/2022   MCV 77 (L) 03/29/2022   PLT 292 03/29/2022   Lab Results  Component Value Date   CREATININE 1.27 08/25/2022   BUN 20 08/25/2022   NA 139 08/25/2022   K 4.7 08/25/2022   CL 100 08/25/2022   CO2 23 08/25/2022   Lab Results  Component Value Date   ALT 40 08/25/2022   AST 25 08/25/2022   ALKPHOS 61 08/25/2022   BILITOT 0.3 08/25/2022   Lab Results  Component Value Date   CHOL 104 08/25/2022   HDL 31 (L) 08/25/2022   LDLCALC 44 08/25/2022   TRIG 170 (H) 08/25/2022   CHOLHDL 3.4 08/25/2022    Lab Results  Component Value Date   HGBA1C 10.0 (A) 08/16/2022    Assessment & Plan    1.  ***  No BP recorded.  {Refresh Note OR Click here to enter BP  :1}***   Joylene Grapes, NP 10/14/2022, 6:02 AM

## 2022-10-20 ENCOUNTER — Ambulatory Visit: Payer: Medicaid Other | Admitting: Urology

## 2023-02-20 ENCOUNTER — Ambulatory Visit: Payer: Medicaid Other | Admitting: Internal Medicine

## 2023-02-20 NOTE — Progress Notes (Unsigned)
Name: Larry Lewis  Age/ Sex: 57 y.o., male   MRN/ DOB: [id number], 1965/05/24     PCP: Grayce Sessions, NP   Reason for Endocrinology Evaluation: Type 2 Diabetes Mellitus  Initial Endocrine Consultative Visit: 05/13/2020    PATIENT IDENTIFIER: [name] is a 57 y.o. male with a past medical history of T2DM, CAD, Asthma and anxiety . The patient has followed with Endocrinology clinic since 05/13/2020 for consultative assistance with management of his diabetes.  DIABETIC HISTORY:  [name] was diagnosed with DM in 2007,has been on Glipizide in the past, Has been on insulin for yrs due to hyperglycemia. His hemoglobin A1c has ranged from 7.5% in 2022, peaking at 12.2% in 2021.  On his initial visit to our clinic his A1c was 7.5%, we continued Metformin and MDI regimen and started Trulicity. We opted to avoid SGLT-2 inhibitors due to BPH symptoms    Intolerant to Trulicity due to vomiting   SUBJECTIVE:   During the last visit (08/16/2022): A1c 10.0%     Today (02/20/2023): [name] is here for a follow up on diabetes management.He is accompanied by his friend today.  He checks his blood sugars occasionally .  No meter today.    He was approved for Dexcom 12/2021, but he does not like to use it   Patient had a follow-up with cardiology for CAD/A-fib/CHF 03/2022   Has noted change in vision , tingling of the head   Has noted nocturia but also had noted urinary retension  Has neuropathic symptom with tingling of the feet   HOME DIABETES REGIMEN:  Metformin 1000 mg , BID  Farxiga 10 mg daily  Basaglar 28 units Twice daily  Novolog 12 units with each meal     Statin: yes ACE-I/ARB: yes Prior Diabetic Education: no   METER DOWNLOAD SUMMARY: Did not bring    DIABETIC COMPLICATIONS: Microvascular complications:   Denies: CKD, neuropathy, retinopathy Last eye exam: Completed 11/27/2020   Macrovascular complications:  CAD ( S/P PCI) Denies:  PVD,  CVA   HISTORY:  Past Medical History:  Past Medical History:  Diagnosis Date   Anxiety    Asthma    Chronic combined systolic and diastolic CHF (congestive heart failure) (HCC)    mildly reduced EF of 45-50% on Echo in 04/2021   Coronary artery disease    s/p 2 stents to RCA in 2007 in [place], [place]   Fall at home, initial encounter 03/24/2020   Hyperlipidemia    Hypertension    Paroxysmal atrial fibrillation (HCC)    Tobacco abuse    Type 2 diabetes mellitus (HCC)    Past Surgical History:  Past Surgical History:  Procedure Laterality Date   CARDIAC CATHETERIZATION     RIGHT/LEFT HEART CATH AND CORONARY ANGIOGRAPHY N/A 07/09/2019   Procedure: RIGHT/LEFT HEART CATH AND CORONARY ANGIOGRAPHY;  Surgeon: Lennette Bihari, MD;  Location: [facility];  Service: Cardiovascular;  Laterality: N/A;   Social History:  reports that he has been smoking cigarettes. He has a 0.5 pack-year smoking history. He has never used smokeless tobacco. He reports that he does not drink alcohol and does not use drugs. Family History:  Family History  Problem Relation Age of Onset   Other Neg Hx      HOME MEDICATIONS: Allergies as of 02/20/2023       Reactions   Penicillins Itching        Medication List        Accurate  as of February 20, 2023  8:51 AM. If you have any questions, ask your nurse or doctor.          acetaminophen 325 MG tablet Commonly known as: TYLENOL Take 2 tablets (650 mg total) by mouth every 4 (four) hours as needed for headache or mild pain.   apixaban 5 MG Tabs tablet Commonly known as: ELIQUIS Take 1 tablet (5 mg total) by mouth 2 (two) times daily.   aspirin EC 81 MG tablet Take 1 tablet (81 mg total) by mouth daily. Swallow whole.   atorvastatin 80 MG tablet Commonly known as: LIPITOR TAKE 1 TABLET (80 MG TOTAL) BY MOUTH DAILY.   Basaglar KwikPen 100 UNIT/ML Inject 30 Units into the skin 2 (two) times daily.   busPIRone 10 MG tablet Commonly  known as: BUSPAR Take 5 mg by mouth 2 (two) times daily.   dapagliflozin propanediol 10 MG Tabs tablet Commonly known as: FARXIGA Take 1 tablet (10 mg total) by mouth daily.   Dexcom G6 Sensor Misc 1 Device by Does not apply route as directed.   Dexcom G6 Transmitter Misc 1 Device by Does not apply route as directed.   Entresto 24-26 MG Generic drug: sacubitril-valsartan Take 1 tablet by mouth 2 (two) times daily.   ezetimibe 10 MG tablet Commonly known as: ZETIA Take 1 tablet (10 mg total) by mouth daily.   furosemide 40 MG tablet Commonly known as: LASIX Take 1 tablet (40 mg total) by mouth daily. What changed: additional instructions   gabapentin 100 MG capsule Commonly known as: NEURONTIN Take 1 capsule (100 mg total) by mouth at bedtime.   guaiFENesin 600 MG 12 hr tablet Commonly known as: MUCINEX Take 1,200 mg by mouth 2 (two) times daily as needed for to loosen phlegm.   insulin aspart 100 UNIT/ML FlexPen Commonly known as: NOVOLOG Max daily 50 units   Insulin Pen Needle 29G X Misc 1 Device by Does not apply route in the morning, at noon, in the evening, and at bedtime.   Insulin Pen Needle 32G X 4 MM Misc 1 Device by Does not apply route in the morning, at noon, in the evening, and at bedtime.   isosorbide dinitrate 30 MG tablet Commonly known as: ISORDIL Take 30 mg by mouth 4 (four) times daily.   isosorbide mononitrate 30 MG 24 hr tablet Commonly known as: IMDUR Take 1 tablet (30 mg total) by mouth daily.   metFORMIN 1000 MG tablet Commonly known as: GLUCOPHAGE Take 1 tablet (1,000 mg total) by mouth 2 (two) times daily with a meal.   metoprolol 200 MG 24 hr tablet Commonly known as: Toprol XL Take 1 tablet (200 mg total) by mouth daily. Take with or immediately following a meal.   nitroGLYCERIN 0.4 MG SL tablet Commonly known as: NITROSTAT PLACE 1 TABLET (0.4 MG TOTAL) UNDER THE TONGUE EVERY 5 (FIVE) MINUTES X 3 DOSES AS NEEDED FOR CHEST  PAIN.   omeprazole 20 MG capsule Commonly known as: PRILOSEC TAKE 1 CAPSULE BY MOUTH EVERY DAY   potassium chloride SA 20 MEQ tablet Commonly known as: KLOR-CON M Take 1 tablet (20 mEq total) by mouth daily.   ranolazine 500 MG 12 hr tablet Commonly known as: Ranexa Take 1 tablet (500 mg total) by mouth 2 (two) times daily.   spironolactone 25 MG tablet Commonly known as: ALDACTONE Take 1 tablet (25 mg total) by mouth daily.   traZODone 50 MG tablet Commonly known as: DESYREL TAKE 1/2  TO 1 TABLET BY MOUTH AT BEDTIME AS NEEDED FOR SLEEP         OBJECTIVE:   Vital Signs: There were no vitals taken for this visit.  Wt Readings from Last 3 Encounters:  08/25/22 206 lb 3.2 oz (93.5 kg)  08/17/22 205 lb 9.6 oz (93.3 kg)  08/16/22 206 lb (93.4 kg)     Exam: General: Pt appears well and is in NAD  Lungs: Clear with good BS bilat   Heart: RRR   Abdomen: Soft, nontender  Extremities: No pretibial edema.   Neuro: MS is good with appropriate affect, pt is alert and Ox3   DM Foot Exam 08/17/2022  The skin of the feet is intact without sores or ulcerations. The pedal pulses are 1+ on right and 1+ on left. The sensation is decreased to a screening 5.07, 10 gram monofilament bilaterally      DATA REVIEWED:  Lab Results  Component Value Date   HGBA1C 10.0 (A) 08/16/2022   HGBA1C 9.1 (A) 12/24/2021   HGBA1C 9.4 (H) 05/05/2021      Latest Reference Range & Units 03/29/22 10:18  Sodium 134 - 144 mmol/L 140  Potassium 3.5 - 5.2 mmol/L 4.3  Chloride 96 - 106 mmol/L 102  CO2 20 - 29 mmol/L 22  Glucose 70 - 99 mg/dL 161 (H)  BUN 6 - 24 mg/dL 18  Creatinine 0.96 - 0.45 mg/dL 4.09  Calcium 8.7 - 81.1 mg/dL 9.7  BUN/Creatinine Ratio 9 - 20  17  eGFR >59 mL/min/1.73 79  Alkaline Phosphatase 44 - 121 IU/L 64  Albumin 3.8 - 4.9 g/dL 4.9  Albumin/Globulin Ratio 1.2 - 2.2  2.0  AST 0 - 40 IU/L 20  ALT 0 - 44 IU/L 38  Total Protein 6.0 - 8.5 g/dL 7.4  Total Bilirubin  0.0 - 1.2 mg/dL 0.2  (H): Data is abnormally high  In office BG 196 mg/dL    ASSESSMENT / PLAN / RECOMMENDATIONS:   1) Type 2 Diabetes Mellitus, Poorly controlled, With neuropathic and macrovascular complications - Most recent A1c of 10.0 %. Goal A1c < 7.0 %.    -Patient continues with poorly controlled diabetes, it is unclear to me the reasons for hyperglycemia as he does not bring his meter nor does he check his glucose on a regular basis, I suspect lack of appropriate prandial coverage -He developed vomiting with Trulicity, I offered Ozempic today, but he does not want any medication that would cause weight loss -I will increase his basal  insulin as below -He had tried the Dexcom, but does not like to use it anymore -I did encourage the patient to check glucose 3 times daily -He was encouraged to use the correction scale 3 times a day, but only take NovoLog 12 units with meal -Patient advised that he could take his NovoLog pen in his pocket and can stay at room temperature for 20 days, and to keep the new and (in the refrigerator, as he tends to take his prandial insulin after the meal -We again discussed the risk of microvascular complications with uncontrolled diabetes    MEDICATIONS: -Continue Metformin 1000 mg , 1 tablet daily -Continue Farxiga 10 mg daily -Increase  Basaglar  30 units Twice daily  -Continue  Novolog 12 units with each meal  - Start Correction Scale : Novolog (BG-130/20) TID and QHS   EDUCATION / INSTRUCTIONS: BG monitoring instructions: Patient is instructed to check his blood sugars 3 times a day, before meals. Call  [facility] if: BG persistently < 70  I reviewed the Rule of 15 for the treatment of hypoglycemia in detail with the patient. Literature supplied.   2) Diabetic complications:  Eye: Does not have known diabetic retinopathy.  Neuro/ Feet: Does not have known diabetic peripheral neuropathy .  Renal: Patient does not have  known baseline CKD. He   is on an ACEI/ARB at present.   3)  Dyslipidemia:     -Per cardiology    4) Peripheral Neuropathy:  -Will refer to podiatry as the patient is scared to cut his toenails due to risk of infection -I have started him on gabapentin to be taken at bedtime, caution against drowsiness  Medication Start gabapentin 100 mg nightly   F/U in 6 months   Signed electronically by: Lyndle Herrlich, MD  [facility] Endocrinology  [facility] [address] [place] [place], [place] [address] Phone: [phone] FAX: [phone]   CC: Grayce Sessions, NP [address] [place] [address] Phone: [phone]  Fax: [phone]  Return to Endocrinology clinic as below: Future Appointments  Date Time Provider Department Center  02/20/2023  2:40 PM Amyia Lodwick, Konrad Dolores, MD [id number] None

## 2023-03-25 ENCOUNTER — Other Ambulatory Visit: Payer: Self-pay | Admitting: Nurse Practitioner

## 2023-06-16 IMAGING — DX DG CHEST 1V PORT
1 series · 1 of 1 positions shown · non-contrast
Comparison: 03/24/2020.

CLINICAL DATA: Shortness of breath, atrial fibrillation.

EXAM:
PORTABLE CHEST 1 VIEW

[chest ap]
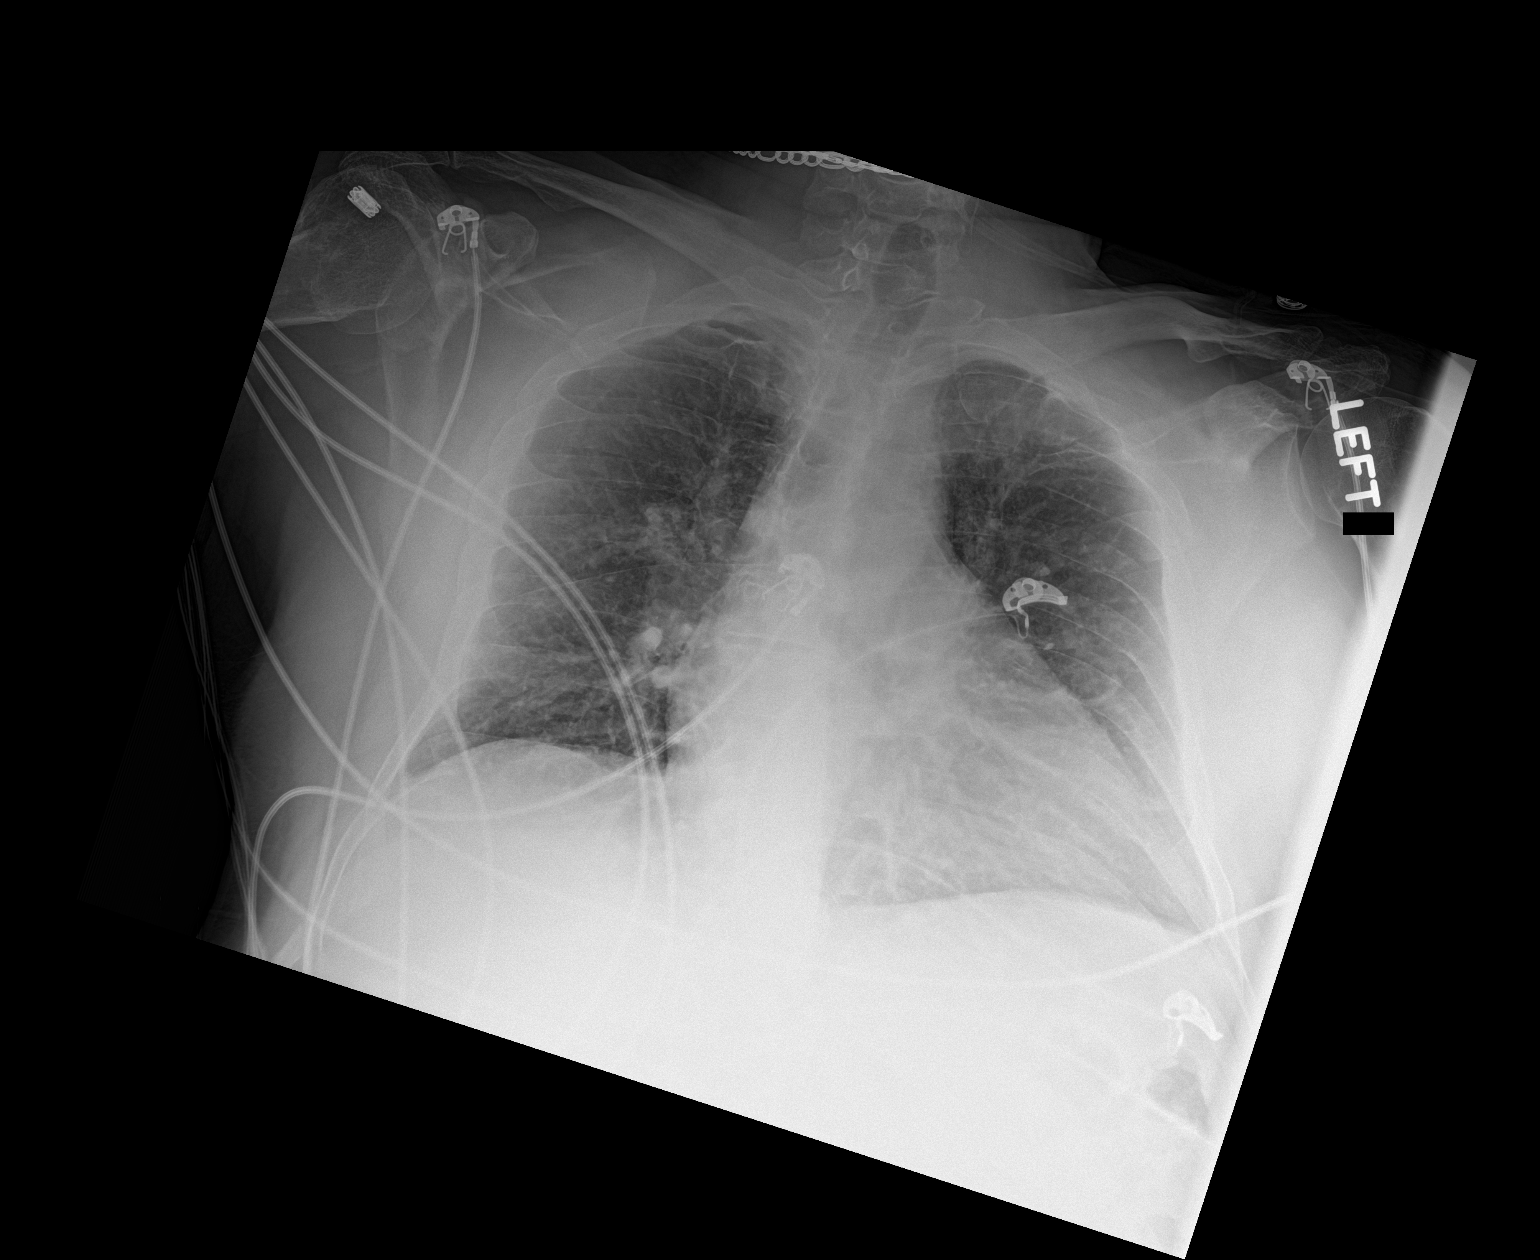

[1 of 1 positions shown; findings below may reference images not displayed]

FINDINGS: The heart size and mediastinal contours are stable. Lung volumes are
low and interstitial prominence is noted bilaterally. No
consolidation, effusion, or pneumothorax. No acute osseous
abnormality.
IMPRESSION: 1. Mild interstitial prominence bilaterally, possible edema or
infiltrate.
2. Cardiomegaly.

## 2024-01-23 ENCOUNTER — Telehealth: Payer: Self-pay | Admitting: Cardiology

## 2024-01-23 NOTE — Telephone Encounter (Signed)
 Agree with plan

## 2024-01-23 NOTE — Telephone Encounter (Signed)
 Spoke to patient's cousin Sherrod he stated patient went out of country for several months.Stated he ran out of all of his medications and has not had any medication in the past several months.Appointment scheduled with Dr.Schumann 11/11 at 3:20 pm.Advised he should keep appointment and we can refill medication at appointment.I will send message to Dr.Schumann to make him aware.

## 2024-01-23 NOTE — Telephone Encounter (Signed)
 Pt has been overseas and ran out of his medication. Pt set appt 11/18 and asked refills be sent, but stated he ran out three months ago. Please advise.

## 2024-01-28 NOTE — Progress Notes (Unsigned)
 Morning Cardiology Office Note:    Date:  01/28/2024   ID:  Larry Lewis, DOB 1965/05/26, MRN [id number]  PCP:  Celestia Rosaline SQUIBB, NP  Cardiologist:  Lonni LITTIE Nanas, MD  Electrophysiologist:  None   Referring MD: Celestia Rosaline SQUIBB, NP   No chief complaint on file.   History of Present Illness:    Larry Lewis is a 58 y.o. male with a hx of severe multivessel CAD, combined systolic and diastolic heart failure, diabetes, hypertension, hyperlipidemia, asthma, tobacco use who presents as a hospital follow-up.  He was admitted to [facility] on 07/05/2019 with acute combined systolic and diastolic heart failure.  He reported a history of CAD, had 2 stents placed in 2007 in [place] .  He had not followed with a cardiologist since that time.  He presented with volume overload, and was treated with IV Lasix .  TTE on 07/06/2019 showed LVEF 35 to 40%, normal RV function, possible bicuspid aortic valve (no AI or AS).  He underwent LHC/RHC on 4/20, which showed severe diffuse multivessel CAD, medical management recommended.  Right heart pressures were normal.  In addition he was diagnosed with type 2 diabetes, A1c 12.2%.  Also found to have LDL 211.  He was lost to follow-up from April through December 2021 as return to [place] as both of his parents were ill.  He was off all his medications for several months.  He was admitted to [facility] from 1/4 through 03/26/2020 after presenting with possible syncope and fall leading to humerus fracture.  He was walking down the stairs at his friend's restaurant and had an episode of lightheadedness and possible sudden loss of consciousness causing him to fall down the stairs and suffer right humeral neck fracture.  Orthostatics were negative.  Echocardiogram on 03/25/2020 showed EF 45 to 50%, grade 1 diastolic dysfunction, normal RV function, no significant valvular disease.  He declined surgery and follow-up with orthopedics was arranged.  Cardiac monitor on 05/22/20 showed NSVT  x 6 seconds.  He was admitted in February 2023 with acute on chronic combined heart failure.  He was diuresed with IV Lasix  and discharged on Lasix  40 mg daily.  He was also noted to have A-fib with RVR during admission but converted to sinus rhythm spontaneously.  Started on Eliquis .  Aspirin  was discontinued and he was discharged on Plavix  plus Eliquis .  Echocardiogram 05/05/2021 showed EF 45 to 50%, grade 1 diastolic dysfunction, normal RV function, mild aortic stenosis.  Since last clinic visit,  he reports he has been doing okay.  States that 3 weeks ago was having episodes of sharp pain in chest.  Chest also felt tender to palpation but nitroglycerin  helped.  No chest pain in last 3 weeks.  Does continue to have shortness of breath with exertion.  Reports some lightheadedness but no syncopal episodes.  Denies any lower extremity edema.  Does report occasional palpitations.  Continues to smoke 7 to 10 cigarettes/day.  Denies any bleeding issues.  He is leaving next week for [place] for 2 to 3 months.  Wt Readings from Last 3 Encounters:  08/25/22 206 lb 3.2 oz (93.5 kg)  08/17/22 205 lb 9.6 oz (93.3 kg)  08/16/22 206 lb (93.4 kg)     Past Medical History:  Diagnosis Date   Anxiety    Asthma    Chronic combined systolic and diastolic CHF (congestive heart failure) (HCC)    mildly reduced EF of 45-50% on Echo in 04/2021   Coronary artery disease  s/p 2 stents to RCA in 2007 in [place], [place]   Fall at home, initial encounter 03/24/2020   Hyperlipidemia    Hypertension    Paroxysmal atrial fibrillation (HCC)    Tobacco abuse    Type 2 diabetes mellitus (HCC)     Past Surgical History:  Procedure Laterality Date   CARDIAC CATHETERIZATION     RIGHT/LEFT HEART CATH AND CORONARY ANGIOGRAPHY N/A 07/09/2019   Procedure: RIGHT/LEFT HEART CATH AND CORONARY ANGIOGRAPHY;  Surgeon: Burnard Debby LABOR, MD;  Location: [facility] INVASIVE CV LAB;  Service: Cardiovascular;  Laterality: N/A;    Current  Medications: No outpatient medications have been marked as taking for the 01/30/24 encounter (Appointment) with Kate Lonni CROME, MD.     Allergies:   Penicillins   Social History   Socioeconomic History   Marital status: Single    Spouse name: Not on file   Number of children: Not on file   Years of education: Not on file   Highest education level: Not on file  Occupational History   Not on file  Tobacco Use   Smoking status: Some Days    Current packs/day: 1.00    Average packs/day: 1 pack/day for 0.5 years (0.5 ttl pk-yrs)    Types: Cigarettes   Smokeless tobacco: Never  Vaping Use   Vaping status: Never Used  Substance and Sexual Activity   Alcohol use: Never   Drug use: Never   Sexual activity: Not on file  Other Topics Concern   Not on file  Social History Narrative   Not on file   Social Drivers of Health   Financial Resource Strain: High Risk (05/18/2021)   Overall Financial Resource Strain (CARDIA)    Difficulty of Paying Living Expenses: Hard  Food Insecurity: Food Insecurity Present (05/18/2021)   Hunger Vital Sign    Worried About Running Out of Food in the Last Year: Sometimes true    Ran Out of Food in the Last Year: Sometimes true  Transportation Needs: No Transportation Needs (05/18/2021)   PRAPARE - Administrator, Civil Service (Medical): No    Lack of Transportation (Non-Medical): No  Physical Activity: Not on file  Stress: Not on file  Social Connections: Not on file     Family History: The patient's family history is negative for Other.  ROS:   Please see the history of present illness.    EKGs/Labs/Other Studies Reviewed:    The following studies were reviewed today:   EKG:  03/29/2022: Normal sinus rhythm, rate 79, nonspecific T wave flattening 07/12/2021: Sinus tachycardia, rate 102, nonspecific T wave flattening 04/06/21: Sinus rhythm, rate 96, less than 1 mm ST depressions in leads II, aVF, V5/6 10/08/20: sinus  rhytm rate 81 >53mm ST depressions in leads I, II, aVL, V5/6 03/22: sinus rhythm, rate 96, less than 1 mm ST depressions in leads I, II, aVL, V5/6  TTE 07/06/19:  1. Left ventricular ejection fraction, by estimation, is 35 to 40%. The  left ventricle has moderately decreased function. The left ventricle  demonstrates regional wall motion abnormalities (see scoring  diagram/findings for description). Left ventricular   diastolic parameters are indeterminate.   2. Right ventricular systolic function is normal. The right ventricular  size is normal.   3. Left atrial size was moderately dilated.   4. The mitral valve is normal in structure. Mild mitral valve  regurgitation. No evidence of mitral stenosis.   5. Indeterminate cusp structure. Given age and amount of calcification,  high suspicion for bicuspid valve, either functional or congenital.. The  aortic valve has an indeterminant number of cusps. Aortic valve  regurgitation is not visualized. Mild to  moderate aortic valve sclerosis/calcification is present, without any  evidence of aortic stenosis.   6. The inferior vena cava is normal in size with greater than 50%  respiratory variability, suggesting right atrial pressure of 3 mmHg.   RHC/LHC 07/09/19: Mid RCA to Dist RCA lesion is 100% stenosed. Acute Mrg lesion is 90% stenosed. Ost RCA to Prox RCA lesion is 70% stenosed. Prox RCA to Mid RCA lesion is 90% stenosed. 1st Mrg lesion is 85% stenosed. 2nd Mrg-1 lesion is 100% stenosed. 2nd Mrg-2 lesion is 100% stenosed. 3rd Mrg lesion is 90% stenosed. Dist Cx lesion is 70% stenosed. Dist LAD lesion is 100% stenosed. 2nd Diag lesion is 95% stenosed. Mid LAD lesion is 50% stenosed. 1st Diag-2 lesion is 40% stenosed. 1st Diag-1 lesion is 80% stenosed. Mid Cx to Dist Cx lesion is 30% stenosed.   Severe diffuse multivessel CAD with moderate irregularity of the LAD with 80% proximal diagonal stenosis prior to a previously placed stent  with intimal hyperplasia within the stented segment, 50% the stenosis, 95% stenosis in the second diagonal vessel and total occlusion of the apical LAD; left circumflex vessel with large OM1 vessel with 85% stenosis proximal to an aneurysmal segment, total occlusion of the OM 2 vessel with previously placed stent in this vessel and 90 and 70% bifurcation stenoses in the distal circumflex; severely diffusely diseased RCA with distal occlusion.  There is faint filling of a probable small PLA vessel via the left injection.   Normal right heart pressures.   LVEDP 16 mmHg.   RECOMMENDATION: Plan initiate medical therapy since at present he is not on any anti-ischemic medications.  Most vessels are not amenable to intervention; however if patient experiences increasing chest pain symptomatology the large OM1 vessel can potentially undergo intervention with stenting.  Aggressive lipid-lowering therapy with target LDL less than 70.  Optimal blood pressure control.  Smoking cessation is essential.  With diffuse CAD consider DAPT therapy.  Recent Labs: No results found for requested labs within last 365 days.  Recent Lipid Panel    Component Value Date/Time   CHOL 104 08/25/2022 1544   TRIG 170 (H) 08/25/2022 1544   HDL 31 (L) 08/25/2022 1544   CHOLHDL 3.4 08/25/2022 1544   CHOLHDL 8.1 07/07/2019 0336   VLDL 29 07/07/2019 0336   LDLCALC 44 08/25/2022 1544    Physical Exam:    VS:  There were no vitals taken for this visit.    Wt Readings from Last 3 Encounters:  08/25/22 206 lb 3.2 oz (93.5 kg)  08/17/22 205 lb 9.6 oz (93.3 kg)  08/16/22 206 lb (93.4 kg)     GEN: in no acute distress HEENT: Normal NECK: No JVD CARDIAC:RRR, 2/6 systolic murmur RESPIRATORY:  Clear to auscultation without rales, wheezing or rhonchi  ABDOMEN: Soft, non-tender, non-distended MUSCULOSKELETAL:  No edema.  SKIN: Warm and dry NEUROLOGIC:  Alert and oriented x 3 PSYCHIATRIC:  Normal affect   ASSESSMENT:     No diagnosis found.   PLAN:    CAD: Severe multivessel CAD not amenable to intervention on catheterization 07/09/2019.   -Continue aspirin  81 mg daily and Eliquis  -Continue Toprol -XL 200 mg daily -Continue imdur  30 mg daily.  Could not tolerate higher doses due to headaches -Sublingual nitroglycerin  as needed  Chronic combined systolic and diastolic heart failure: EF  35 to 40% on TTE 07/06/2019.  Echocardiogram on 03/25/2020 showed EF 45 to 50%, grade 1 diastolic dysfunction, normal RV function, no significant valvular disease.  Echocardiogram 05/05/2021 showed EF 45 to 50%, grade 1 diastolic dysfunction, normal RV function, mild aortic stenosis. -Continue Toprol -XL 200 mg daily -Continue Entresto  24-26 mg twice daily -Continue spironolactone  25 mg daily -Continue Farxiga  10 mg daily -Continue Imdur  30 mg daily -Continue lasix  40 mg daily -Check CMET, magnesium***  Paroxysmal atrial fibrillation: New onset atrial fibrillation during admission 04/2021.  Converted to sinus rhythm spontaneously.  CHA2DS2-VASc score 4 (CHF, hypertension, T2DM, CAD) -Continue Eliquis  5 mg twice daily.  Check CBC -Continue Toprol -XL 200 mg daily  Syncope: Had episode of lightheadedness/suspected syncope resulting in fall down stairs and found to have fractured humerus 03/2020. Cardiac monitor on 05/22/20 showed NSVT x 6 seconds.  He was not orthostatic on presentation.  Given his severe multivessel CAD and systolic heart failure, and considering NSVT on monitor, concern for ventricular arrhythmia as cause of his syncope.  Does not meet indication for ICD at this time, as his EF is greater than 35% and longest episode of NSVT was 6 seconds on cardiac monitor.  Recommend loop recorder for long-term monitoring but he declined.  States he will reconsider if he has another episode  Hyperlipidemia: LDL 190 on 02/24/2020 but had been off of statin.  Restarted atorvastatin  80 mg daily.  LDL 124 on 10/08/2020.  Added Zetia  10 mg  daily.  LDL 65 03/2021.  Check lipid panel  Type 2 diabetes: A1c 12.2 07/05/2019, improved to 7.5 on 03/25/2020.  Continue insulin  and Farxiga , follows with endocrinology.  Most recent A1c 9.1% on 12/24/2021  Tobacco use: Continues to smoke 7 to 10 cigarettes/day.  Patient counseled on risks of tobacco use and cessation strongly encouraged.    Lower extremity pain: Normal ABIs 03/04/2020  GERD: Continue omeprazole   ED: Reports was started on sildenafil  by his PCP.  Recommend avoiding this given he is on Imdur  for his CAD.  Referred to urology for evaluation   RTC in 3 months***   Medication Adjustments/Labs and Tests Ordered: Current medicines are reviewed at length with the patient today.  Concerns regarding medicines are outlined above.  No orders of the defined types were placed in this encounter.    No orders of the defined types were placed in this encounter.    There are no Patient Instructions on file for this visit.      Signed, Lonni LITTIE Nanas, MD  01/28/2024 10:50 PM    [facility]

## 2024-01-30 ENCOUNTER — Other Ambulatory Visit (HOSPITAL_COMMUNITY): Payer: Self-pay

## 2024-01-30 ENCOUNTER — Ambulatory Visit: Payer: Self-pay | Attending: Cardiology | Admitting: Cardiology

## 2024-01-30 VITALS — BP 128/70 | HR 99 | Ht 69.0 in | Wt 221.0 lb

## 2024-01-30 DIAGNOSIS — I10 Essential (primary) hypertension: Secondary | ICD-10-CM

## 2024-01-30 DIAGNOSIS — Z720 Tobacco use: Secondary | ICD-10-CM

## 2024-01-30 DIAGNOSIS — I480 Paroxysmal atrial fibrillation: Secondary | ICD-10-CM

## 2024-01-30 DIAGNOSIS — I25118 Atherosclerotic heart disease of native coronary artery with other forms of angina pectoris: Secondary | ICD-10-CM

## 2024-01-30 DIAGNOSIS — E785 Hyperlipidemia, unspecified: Secondary | ICD-10-CM

## 2024-01-30 DIAGNOSIS — I5042 Chronic combined systolic (congestive) and diastolic (congestive) heart failure: Secondary | ICD-10-CM

## 2024-01-30 MED ORDER — NITROGLYCERIN 0.4 MG SL SUBL
0.4000 mg | SUBLINGUAL_TABLET | SUBLINGUAL | 1 refills | Status: AC | PRN
  Filled 2024-01-30: qty 25, 15d supply, fill #0
  Filled 2024-01-30: qty 75, 90d supply, fill #0
  Filled 2024-04-01: qty 25, 15d supply, fill #1

## 2024-01-30 MED ORDER — METOPROLOL TARTRATE 25 MG PO TABS
25.0000 mg | ORAL_TABLET | Freq: Every day | ORAL | 3 refills | Status: DC
  Filled 2024-01-30: qty 90, 90d supply, fill #0
  Filled 2024-01-30: qty 30, 30d supply, fill #0

## 2024-01-30 MED ORDER — ISOSORBIDE MONONITRATE ER 30 MG PO TB24
30.0000 mg | ORAL_TABLET | Freq: Every day | ORAL | 3 refills | Status: AC
  Filled 2024-01-30: qty 30, 30d supply, fill #0
  Filled 2024-01-30: qty 90, 90d supply, fill #0
  Filled 2024-02-29: qty 30, 30d supply, fill #1
  Filled 2024-04-01: qty 30, 30d supply, fill #2

## 2024-01-30 MED ORDER — ATORVASTATIN CALCIUM 80 MG PO TABS
80.0000 mg | ORAL_TABLET | Freq: Every day | ORAL | 3 refills | Status: AC
  Filled 2024-01-30: qty 90, 90d supply, fill #0
  Filled 2024-01-30: qty 30, 30d supply, fill #0
  Filled 2024-02-29: qty 30, 30d supply, fill #1
  Filled 2024-04-01: qty 30, 30d supply, fill #2

## 2024-01-30 MED ORDER — METOPROLOL SUCCINATE ER 25 MG PO TB24
25.0000 mg | ORAL_TABLET | Freq: Every day | ORAL | 3 refills | Status: DC
  Filled 2024-01-30: qty 30, 30d supply, fill #0

## 2024-01-30 MED ORDER — ASPIRIN EC 81 MG PO TBEC
81.0000 mg | DELAYED_RELEASE_TABLET | Freq: Every day | ORAL | 3 refills | Status: AC
Start: 2024-01-30 — End: ?
  Filled 2024-01-30: qty 90, 90d supply, fill #0

## 2024-01-30 MED ORDER — EZETIMIBE 10 MG PO TABS
10.0000 mg | ORAL_TABLET | Freq: Every day | ORAL | 3 refills | Status: AC
  Filled 2024-01-30: qty 90, 90d supply, fill #0
  Filled 2024-01-30: qty 30, 30d supply, fill #0
  Filled 2024-02-29: qty 30, 30d supply, fill #1
  Filled 2024-04-01: qty 90, 90d supply, fill #2

## 2024-01-30 MED ORDER — FUROSEMIDE 20 MG PO TABS
20.0000 mg | ORAL_TABLET | Freq: Every day | ORAL | 3 refills | Status: AC
  Filled 2024-01-30: qty 90, 90d supply, fill #0
  Filled 2024-01-30: qty 30, 30d supply, fill #0
  Filled 2024-02-29: qty 30, 30d supply, fill #1
  Filled 2024-04-01: qty 30, 30d supply, fill #2

## 2024-01-30 NOTE — Patient Instructions (Addendum)
 Medication Instructions:  Stop: Spironolactone  25mg , Entresto  24-26mg , Ranexa  500mg , Farxiga  10mg , metoprolol  200mg , Lasix  40 mg and Isordil  30mg   Start: Metoprolol  25mg  daily, Atorvastatin  80mg  daily, Zetia  10mg  daily, Isosorbide  30mg  daily, Aspirin  81mg  daily, Lasix  20mg  daily, Nitroglycerin  0.4mg  as needed. *If you need a refill on your cardiac medications before your next appointment, please call your pharmacy*  Lab Work: Today: CBC, CMP, LIPID PANEL, A1C, MAGNESIUM  If you have labs (blood work) drawn today and your tests are completely normal, you will receive your results only by: MyChart Message (if you have MyChart) OR A paper copy in the mail If you have any lab test that is abnormal or we need to change your treatment, we will call you to review the results.  Testing/Procedures: Echocardiogram  Your physician has requested that you have an echocardiogram. Echocardiography is a painless test that uses sound waves to create images of your heart. It provides your doctor with information about the size and shape of your heart and how well your heart's chambers and valves are working. This procedure takes approximately one hour. There are no restrictions for this procedure. Please do NOT wear cologne, perfume, aftershave, or lotions (deodorant is allowed). Please arrive 15 minutes prior to your appointment time.  Please note: We ask at that you not bring children with you during ultrasound (echo/ vascular) testing. Due to room size and safety concerns, children are not allowed in the ultrasound rooms during exams. Our front office staff cannot provide observation of children in our lobby area while testing is being conducted. An adult accompanying a patient to their appointment will only be allowed in the ultrasound room at the discretion of the ultrasound technician under special circumstances. We apologize for any inconvenience.   Follow-Up: Referral to Pharmacy Department At [facility], you and your health needs are our priority.  As part of our continuing mission to provide you with exceptional heart care, our providers are all part of one team.  This team includes your primary Cardiologist (physician) and Advanced Practice Providers or APPs (Physician Assistants and Nurse Practitioners) who all work together to provide you with the care you need, when you need it.  Your next appointment:   November 25th @ 11:20am   Provider:   Lonni LITTIE Nanas, MD    We recommend signing up for the patient portal called MyChart.  Sign up information is provided on this After Visit Summary.  MyChart is used to connect with patients for Virtual Visits (Telemedicine).  Patients are able to view lab/test results, encounter notes, upcoming appointments, etc.  Non-urgent messages can be sent to your provider as well.   To learn more about what you can do with MyChart, go to [email or web address].   Other Instructions None

## 2024-01-31 ENCOUNTER — Telehealth: Payer: Self-pay | Admitting: Licensed Clinical Social Worker

## 2024-01-31 NOTE — Telephone Encounter (Signed)
 H&V Care Navigation CSW Progress Note  Clinical Social Worker completed chart review as familiar with pt, previously had Medicaid now Self Pay. LCSW notes that pt has been residing in [place] and plans to return in February. Remain available should pt re-establish residency here long term.  Patient is participating in a Managed Medicaid Plan:  No, self pay only  SDOH Screenings   Food Insecurity: Food Insecurity Present (05/18/2021)  Housing: Low Risk  (05/18/2021)  Transportation Needs: No Transportation Needs (05/18/2021)  Depression (PHQ2-9): High Risk (05/17/2021)  Financial Resource Strain: High Risk (05/18/2021)  Tobacco Use: High Risk (08/26/2022)     Marit Lark, MSW, LCSW Clinical Social Worker II [facility] Heart/Vascular Care Navigation  [phone]- work cell phone (preferred)

## 2024-02-06 ENCOUNTER — Ambulatory Visit: Payer: Self-pay | Admitting: Nurse Practitioner

## 2024-02-12 LAB — LIPID PANEL

## 2024-02-12 NOTE — Progress Notes (Unsigned)
 Morning Cardiology Office Note:    Date:  02/13/2024   ID:  Elsa Pellet, DOB 1965-12-17, MRN [id number]  PCP:  Celestia Rosaline SQUIBB, NP  Cardiologist:  Lonni LITTIE Nanas, MD  Electrophysiologist:  None   Referring MD: Celestia Rosaline SQUIBB, NP   Chief Complaint  Patient presents with   Coronary Artery Disease    History of Present Illness:    Larry Lewis is a 58 y.o. male with a hx of severe multivessel CAD, combined systolic and diastolic heart failure, diabetes, hypertension, hyperlipidemia, asthma, tobacco use who presents as a hospital follow-up.  He was admitted to [facility] on 07/05/2019 with acute combined systolic and diastolic heart failure.  He reported a history of CAD, had 2 stents placed in 2007 in [place] .  He had not followed with a cardiologist since that time.  He presented with volume overload, and was treated with IV Lasix .  TTE on 07/06/2019 showed LVEF 35 to 40%, normal RV function, possible bicuspid aortic valve (no AI or AS).  He underwent LHC/RHC on 4/20, which showed severe diffuse multivessel CAD, medical management recommended.  Right heart pressures were normal.  In addition he was diagnosed with type 2 diabetes, A1c 12.2%.  Also found to have LDL 211.  He was lost to follow-up from April through December 2021 as return to [place] as both of his parents were ill.  He was off all his medications for several months.  He was admitted to [facility] from 1/4 through 03/26/2020 after presenting with possible syncope and fall leading to humerus fracture.  He was walking down the stairs at his friend's restaurant and had an episode of lightheadedness and possible sudden loss of consciousness causing him to fall down the stairs and suffer right humeral neck fracture.  Orthostatics were negative.  Echocardiogram on 03/25/2020 showed EF 45 to 50%, grade 1 diastolic dysfunction, normal RV function, no significant valvular disease.  He declined surgery and follow-up with orthopedics was  arranged.  Cardiac monitor on 05/22/20 showed NSVT x 6 seconds.  He was admitted in February 2023 with acute on chronic combined heart failure.  He was diuresed with IV Lasix  and discharged on Lasix  40 mg daily.  He was also noted to have A-fib with RVR during admission but converted to sinus rhythm spontaneously.  Started on Eliquis .  Aspirin  was discontinued and he was discharged on Plavix  plus Eliquis .  Echocardiogram 05/05/2021 showed EF 45 to 50%, grade 1 diastolic dysfunction, normal RV function, mild aortic stenosis.  Since last clinic visit, he reports he continues to have dyspnea and chest pain with exertion.  States that he feels very tired, he is sleeping a lot.  Reports feels symptoms with minimal exertion.  Reports some lightheadedness but denies any syncope.  Does report some lower extremity edema.  He is smoking 0.5 packs/day.   Wt Readings from Last 3 Encounters:  02/13/24 219 lb 3.2 oz (99.4 kg)  01/30/24 221 lb (100.2 kg)  08/25/22 206 lb 3.2 oz (93.5 kg)     Past Medical History:  Diagnosis Date   Anxiety    Asthma    Chronic combined systolic and diastolic CHF (congestive heart failure) (HCC)    mildly reduced EF of 45-50% on Echo in 04/2021   Coronary artery disease    s/p 2 stents to RCA in 2007 in [place], [place]   Fall at home, initial encounter 03/24/2020   Hyperlipidemia    Hypertension    Paroxysmal atrial fibrillation (HCC)  Tobacco abuse    Type 2 diabetes mellitus (HCC)     Past Surgical History:  Procedure Laterality Date   CARDIAC CATHETERIZATION     RIGHT/LEFT HEART CATH AND CORONARY ANGIOGRAPHY N/A 07/09/2019   Procedure: RIGHT/LEFT HEART CATH AND CORONARY ANGIOGRAPHY;  Surgeon: Burnard Debby LABOR, MD;  Location: [facility] INVASIVE CV LAB;  Service: Cardiovascular;  Laterality: N/A;    Current Medications: Current Meds  Medication Sig   aspirin  EC 81 MG tablet Take 1 tablet (81 mg total) by mouth daily. Swallow whole.   atorvastatin  (LIPITOR ) 80 MG tablet Take  1 tablet (80 mg total) by mouth daily.   Continuous Blood Gluc Sensor (DEXCOM G6 SENSOR) MISC 1 Device by Does not apply route as directed.   Continuous Blood Gluc Transmit (DEXCOM G6 TRANSMITTER) MISC 1 Device by Does not apply route as directed.   ezetimibe  (ZETIA ) 10 MG tablet Take 1 tablet (10 mg total) by mouth daily.   furosemide  (LASIX ) 20 MG tablet Take 1 tablet (20 mg total) by mouth daily.   insulin  aspart (NOVOLOG ) 100 UNIT/ML FlexPen Max daily 50 units   Insulin  Glargine (BASAGLAR  KWIKPEN) 100 UNIT/ML Inject 30 Units into the skin 2 (two) times daily.   Insulin  Pen Needle 29G X MISC 1 Device by Does not apply route in the morning, at noon, in the evening, and at bedtime.   Insulin  Pen Needle 32G X 4 MM MISC 1 Device by Does not apply route in the morning, at noon, in the evening, and at bedtime.   isosorbide  mononitrate (IMDUR ) 30 MG 24 hr tablet Take 1 tablet (30 mg total) by mouth daily.   losartan  (COZAAR ) 25 MG tablet Take 1 tablet (25 mg total) by mouth daily.   metFORMIN  (GLUCOPHAGE ) 1000 MG tablet Take 1 tablet (1,000 mg total) by mouth 2 (two) times daily with a meal.   metoprolol  succinate (TOPROL -XL) 50 MG 24 hr tablet Take 1 tablet (50 mg total) by mouth daily. Take with or immediately following a meal.   nitroGLYCERIN  (NITROSTAT ) 0.4 MG SL tablet Place 1 tablet (0.4 mg total) under the tongue every 5 (five) minutes x 3 doses as needed for chest pain.   [DISCONTINUED] metoprolol  succinate (TOPROL  XL) 25 MG 24 hr tablet Take 1 tablet (25 mg total) by mouth daily.     Allergies:   Penicillins   Social History   Socioeconomic History   Marital status: Single    Spouse name: Not on file   Number of children: Not on file   Years of education: Not on file   Highest education level: Not on file  Occupational History   Not on file  Tobacco Use   Smoking status: Some Days    Current packs/day: 1.00    Average packs/day: 1 pack/day for 0.5 years (0.5 ttl pk-yrs)     Types: Cigarettes   Smokeless tobacco: Never  Vaping Use   Vaping status: Never Used  Substance and Sexual Activity   Alcohol use: Never   Drug use: Never   Sexual activity: Not on file  Other Topics Concern   Not on file  Social History Narrative   Not on file   Social Drivers of Health   Financial Resource Strain: High Risk (05/18/2021)   Overall Financial Resource Strain (CARDIA)    Difficulty of Paying Living Expenses: Hard  Food Insecurity: Food Insecurity Present (05/18/2021)   Hunger Vital Sign    Worried About Running Out of Food in the Last  Year: Sometimes true    Ran Out of Food in the Last Year: Sometimes true  Transportation Needs: No Transportation Needs (05/18/2021)   PRAPARE - Administrator, Civil Service (Medical): No    Lack of Transportation (Non-Medical): No  Physical Activity: Not on file  Stress: Not on file  Social Connections: Not on file     Family History: The patient's family history is negative for Other.  ROS:   Please see the history of present illness.    EKGs/Labs/Other Studies Reviewed:    The following studies were reviewed today:   EKG:  01/30/2024: Normal sinus rhythm, QTc 503, rate 99, nonspecific T wave flattening 03/29/2022: Normal sinus rhythm, rate 79, nonspecific T wave flattening 07/12/2021: Sinus tachycardia, rate 102, nonspecific T wave flattening 04/06/21: Sinus rhythm, rate 96, less than 1 mm ST depressions in leads II, aVF, V5/6 10/08/20: sinus rhytm rate 81 >50mm ST depressions in leads I, II, aVL, V5/6 03/22: sinus rhythm, rate 96, less than 1 mm ST depressions in leads I, II, aVL, V5/6  TTE 07/06/19:  1. Left ventricular ejection fraction, by estimation, is 35 to 40%. The  left ventricle has moderately decreased function. The left ventricle  demonstrates regional wall motion abnormalities (see scoring  diagram/findings for description). Left ventricular   diastolic parameters are indeterminate.   2. Right  ventricular systolic function is normal. The right ventricular  size is normal.   3. Left atrial size was moderately dilated.   4. The mitral valve is normal in structure. Mild mitral valve  regurgitation. No evidence of mitral stenosis.   5. Indeterminate cusp structure. Given age and amount of calcification,  high suspicion for bicuspid valve, either functional or congenital.. The  aortic valve has an indeterminant number of cusps. Aortic valve  regurgitation is not visualized. Mild to  moderate aortic valve sclerosis/calcification is present, without any  evidence of aortic stenosis.   6. The inferior vena cava is normal in size with greater than 50%  respiratory variability, suggesting right atrial pressure of 3 mmHg.   RHC/LHC 07/09/19: Mid RCA to Dist RCA lesion is 100% stenosed. Acute Mrg lesion is 90% stenosed. Ost RCA to Prox RCA lesion is 70% stenosed. Prox RCA to Mid RCA lesion is 90% stenosed. 1st Mrg lesion is 85% stenosed. 2nd Mrg-1 lesion is 100% stenosed. 2nd Mrg-2 lesion is 100% stenosed. 3rd Mrg lesion is 90% stenosed. Dist Cx lesion is 70% stenosed. Dist LAD lesion is 100% stenosed. 2nd Diag lesion is 95% stenosed. Mid LAD lesion is 50% stenosed. 1st Diag-2 lesion is 40% stenosed. 1st Diag-1 lesion is 80% stenosed. Mid Cx to Dist Cx lesion is 30% stenosed.   Severe diffuse multivessel CAD with moderate irregularity of the LAD with 80% proximal diagonal stenosis prior to a previously placed stent with intimal hyperplasia within the stented segment, 50% the stenosis, 95% stenosis in the second diagonal vessel and total occlusion of the apical LAD; left circumflex vessel with large OM1 vessel with 85% stenosis proximal to an aneurysmal segment, total occlusion of the OM 2 vessel with previously placed stent in this vessel and 90 and 70% bifurcation stenoses in the distal circumflex; severely diffusely diseased RCA with distal occlusion.  There is faint filling of a  probable small PLA vessel via the left injection.   Normal right heart pressures.   LVEDP 16 mmHg.   RECOMMENDATION: Plan initiate medical therapy since at present he is not on any anti-ischemic medications.  Most  vessels are not amenable to intervention; however if patient experiences increasing chest pain symptomatology the large OM1 vessel can potentially undergo intervention with stenting.  Aggressive lipid-lowering therapy with target LDL less than 70.  Optimal blood pressure control.  Smoking cessation is essential.  With diffuse CAD consider DAPT therapy.  Recent Labs: 02/12/2024: ALT 64; BUN 19; Creatinine, Ser 1.15; Hemoglobin 12.6; Magnesium 2.0; Platelets 279; Potassium 4.3; Sodium 136  Recent Lipid Panel    Component Value Date/Time   CHOL 107 02/12/2024 1231   TRIG 395 (H) 02/12/2024 1231   HDL 19 (L) 02/12/2024 1231   CHOLHDL 5.6 (H) 02/12/2024 1231   CHOLHDL 8.1 07/07/2019 0336   VLDL 29 07/07/2019 0336   LDLCALC 31 02/12/2024 1231    Physical Exam:    VS:  BP 135/84   Pulse 87   Ht 5' 9 (1.753 m)   Wt 219 lb 3.2 oz (99.4 kg)   SpO2 97%   BMI 32.37 kg/m     Wt Readings from Last 3 Encounters:  02/13/24 219 lb 3.2 oz (99.4 kg)  01/30/24 221 lb (100.2 kg)  08/25/22 206 lb 3.2 oz (93.5 kg)     GEN: in no acute distress HEENT: Normal NECK: No JVD CARDIAC:RRR, 2/6 systolic murmur RESPIRATORY:  Clear to auscultation without rales, wheezing or rhonchi  ABDOMEN: Soft, non-tender, non-distended MUSCULOSKELETAL:  No edema.  SKIN: Warm and dry NEUROLOGIC:  Alert and oriented x 3 PSYCHIATRIC:  Normal affect   ASSESSMENT:    1. Coronary artery disease of native artery of native heart with stable angina pectoris   2. Chronic combined systolic and diastolic heart failure (HCC)   3. Essential hypertension   4. Medication management   5. Paroxysmal atrial fibrillation (HCC)   6. Tobacco use       PLAN:    CAD: Severe multivessel CAD not amenable to  intervention on catheterization 07/09/2019.  He is reporting chest pain consistent with stable angina manage but has not been taking his medications -Continue aspirin  81 mg daily -Continue Toprol -XL.  Previously was taking 200 mg daily but has been off medications for months.  Will need to add back slowly, started 25 mg daily at clinic visit 01/30/2024.  Will increase to 50 mg daily - Previously on imdur  30 mg daily but has been off his medications.  Could not tolerate higher doses due to headaches.  He is having symptoms suggestive of typical angina, restarted Imdur  -Sublingual nitroglycerin  as needed  Chronic combined systolic and diastolic heart failure: EF 35 to 40% on TTE 07/06/2019.  Echocardiogram on 03/25/2020 showed EF 45 to 50%, grade 1 diastolic dysfunction, normal RV function, no significant valvular disease.  Echocardiogram 05/05/2021 showed EF 45 to 50%, grade 1 diastolic dysfunction, normal RV function, mild aortic stenosis. - Previous regimen was Toprol -XL 200 mg daily, Entresto  24-26 mg twice daily, spironolactone  25 mg daily, Farxiga  10 mg daily, Imdur  30 mg daily, Lasix  40 mg daily.  He has been in [place] for over a year and has been off all his medications for months.  Will need to gradually add back medications.  Unfortunately he no longer has Medicaid and is now self-pay.  Will not be able to represcribe Entresto  and Farxiga  at this time.  He is in the process of reapplying for Medicaid.  Started by adding back Toprol -XL 25 mg daily, Imdur  30 mg daily, and Lasix  20 mg daily at appointment 01/30/2024.  Will increase Toprol -XL to 50 mg daily and add  losartan  25 mg daily.  Check BMET in 1 week - Update echocardiogram  Paroxysmal atrial fibrillation: New onset atrial fibrillation during admission 04/2021.  Converted to sinus rhythm spontaneously.  CHA2DS2-VASc score 4 (CHF, hypertension, T2DM, CAD) - He is off his Eliquis , unable to afford as no longer has Medicaid.  Will plan to schedule in  pharmacy clinic - Restarted Toprol  XL as above  Syncope: Had episode of lightheadedness/suspected syncope resulting in fall down stairs and found to have fractured humerus 03/2020. Cardiac monitor on 05/22/20 showed NSVT x 6 seconds.  He was not orthostatic on presentation.  Given his severe multivessel CAD and systolic heart failure, and considering NSVT on monitor, concern for ventricular arrhythmia as cause of his syncope.  Does not meet indication for ICD at this time, as his EF is greater than 35% and longest episode of NSVT was 6 seconds on cardiac monitor.  Recommend loop recorder for long-term monitoring but he declined.  States he will reconsider if he has another episode - Denies any recent syncopal episodes  Hyperlipidemia: LDL 190 on 02/24/2020 but had been off of statin.  Restarted atorvastatin  80 mg daily.  LDL 124 on 10/08/2020.  Added Zetia  10 mg daily.  LDL 65 03/2021.   -He has been off his medication, he started atorvastatin  80 mg daily and Zetia  10 mg daily.  LDL 31 on 02/12/2024  Type 2 diabetes: A1c 12.2 07/05/2019, improved to 7.5 on 03/25/2020.  Continue insulin  and Farxiga , follows with endocrinology.  Most recent A1c 9.1% on 12/24/2021 - He has been off his insulin .  Encouraged him to reestablish with endocrinology.  A1c 8.3% on 02/12/2024  Tobacco use: Continues to smoke 10 cigarettes/day.  Patient counseled on risks of tobacco use and cessation strongly encouraged.    Lower extremity pain: Normal ABIs 03/04/2020  GERD: Continue omeprazole   ED: previously started on sildenafil  by his PCP.  Recommend avoiding this given he is on Imdur  for his CAD.  Referred to urology for evaluation   RTC in 3 months   Medication Adjustments/Labs and Tests Ordered: Current medicines are reviewed at length with the patient today.  Concerns regarding medicines are outlined above.  Orders Placed This Encounter  Procedures   Basic Metabolic Panel (BMET)     Meds ordered this encounter   Medications   losartan  (COZAAR ) 25 MG tablet    Sig: Take 1 tablet (25 mg total) by mouth daily.    Dispense:  30 tablet    Refill:  3   metoprolol  succinate (TOPROL -XL) 50 MG 24 hr tablet    Sig: Take 1 tablet (50 mg total) by mouth daily. Take with or immediately following a meal.    Dispense:  90 tablet    Refill:  2     Patient Instructions  Medication Instructions:  Your physician has recommended you make the following change in your medication:  START Losartan  25 mg once daily INCREASE Toprol  to 50 mg daily  *If you need a refill on your cardiac medications before your next appointment, please call your pharmacy*  Lab Work: Your physician recommends that you return for lab work in: 1 week for a BMET  If you have any lab test that is abnormal or we need to change your treatment, we will call you to review the results.  Testing/Procedures: None ordered  Follow-Up: At [facility], you and your health needs are our priority.  As part of our continuing mission to provide you with exceptional heart  care, our providers are all part of one team.  This team includes your primary Cardiologist (physician) and Advanced Practice Providers or APPs (Physician Assistants and Nurse Practitioners) who all work together to provide you with the care you need, when you need it.  Your next appointment:   3 month(s)  Provider:   Lonni LITTIE Nanas, MD     Thank you for choosing [facility]!!   [phone]           Signed, Lonni LITTIE Nanas, MD  02/13/2024 12:50 PM    [facility]

## 2024-02-13 ENCOUNTER — Other Ambulatory Visit (HOSPITAL_COMMUNITY): Payer: Self-pay

## 2024-02-13 ENCOUNTER — Ambulatory Visit: Payer: Self-pay | Admitting: Cardiology

## 2024-02-13 ENCOUNTER — Encounter: Payer: Self-pay | Admitting: Cardiology

## 2024-02-13 ENCOUNTER — Ambulatory Visit: Payer: Self-pay | Attending: Cardiology | Admitting: Cardiology

## 2024-02-13 VITALS — BP 135/84 | HR 87 | Ht 69.0 in | Wt 219.2 lb

## 2024-02-13 DIAGNOSIS — I25118 Atherosclerotic heart disease of native coronary artery with other forms of angina pectoris: Secondary | ICD-10-CM

## 2024-02-13 DIAGNOSIS — I5042 Chronic combined systolic (congestive) and diastolic (congestive) heart failure: Secondary | ICD-10-CM

## 2024-02-13 DIAGNOSIS — Z720 Tobacco use: Secondary | ICD-10-CM

## 2024-02-13 DIAGNOSIS — Z79899 Other long term (current) drug therapy: Secondary | ICD-10-CM

## 2024-02-13 DIAGNOSIS — I480 Paroxysmal atrial fibrillation: Secondary | ICD-10-CM

## 2024-02-13 DIAGNOSIS — I10 Essential (primary) hypertension: Secondary | ICD-10-CM

## 2024-02-13 LAB — COMPREHENSIVE METABOLIC PANEL WITH GFR
ALT: 64 IU/L — AB (ref 0–44)
AST: 31 IU/L (ref 0–40)
Albumin: 4.6 g/dL (ref 3.8–4.9)
Alkaline Phosphatase: 56 IU/L (ref 47–123)
BUN/Creatinine Ratio: 17 (ref 9–20)
BUN: 19 mg/dL (ref 6–24)
Bilirubin Total: 0.3 mg/dL (ref 0.0–1.2)
CO2: 22 mmol/L (ref 20–29)
Calcium: 9.9 mg/dL (ref 8.7–10.2)
Chloride: 94 mmol/L — AB (ref 96–106)
Creatinine, Ser: 1.15 mg/dL (ref 0.76–1.27)
Globulin, Total: 3 g/dL (ref 1.5–4.5)
Glucose: 367 mg/dL — AB (ref 70–99)
Potassium: 4.3 mmol/L (ref 3.5–5.2)
Sodium: 136 mmol/L (ref 134–144)
Total Protein: 7.6 g/dL (ref 6.0–8.5)
eGFR: 73 mL/min/1.73 (ref >59)

## 2024-02-13 LAB — CBC
Hematocrit: 41.1 % (ref 37.5–51.0)
Hemoglobin: 12.6 g/dL — ABNORMAL LOW (ref 13.0–17.7)
MCH: 25.1 pg — ABNORMAL LOW (ref 26.6–33.0)
MCHC: 30.7 g/dL — ABNORMAL LOW (ref 31.5–35.7)
MCV: 82 fL (ref 79–97)
Platelets: 279 x10E3/uL (ref 150–450)
RBC: 5.01 x10E6/uL (ref 4.14–5.80)
RDW: 14 % (ref 11.6–15.4)
WBC: 8.2 x10E3/uL (ref 3.4–10.8)

## 2024-02-13 LAB — LIPID PANEL
Cholesterol, Total: 107 mg/dL (ref 100–199)
HDL: 19 mg/dL — AB (ref >39)
LDL CALC COMMENT:: 5.6 ratio — AB (ref 0.0–5.0)
LDL Chol Calc (NIH): 31 mg/dL (ref 0–99)
Triglycerides: 395 mg/dL — AB (ref 0–149)
VLDL Cholesterol Cal: 57 mg/dL — AB (ref 5–40)

## 2024-02-13 LAB — MAGNESIUM: Magnesium: 2 mg/dL (ref 1.6–2.3)

## 2024-02-13 LAB — HEMOGLOBIN A1C
Est. average glucose Bld gHb Est-mCnc: 192 mg/dL
Hgb A1c MFr Bld: 8.3 % — ABNORMAL HIGH (ref 4.8–5.6)

## 2024-02-13 MED ORDER — LOSARTAN POTASSIUM 25 MG PO TABS
25.0000 mg | ORAL_TABLET | Freq: Every day | ORAL | 3 refills | Status: AC
  Filled 2024-02-13: qty 30, 30d supply, fill #0
  Filled 2024-04-01: qty 30, 30d supply, fill #1

## 2024-02-13 MED ORDER — METOPROLOL SUCCINATE ER 50 MG PO TB24
50.0000 mg | ORAL_TABLET | Freq: Every day | ORAL | 2 refills | Status: AC
  Filled 2024-02-13: qty 90, 90d supply, fill #0
  Filled 2024-02-13: qty 30, 30d supply, fill #0
  Filled 2024-04-01: qty 30, 30d supply, fill #1

## 2024-02-13 NOTE — Patient Instructions (Signed)
 Medication Instructions:  Your physician has recommended you make the following change in your medication:  START Losartan  25 mg once daily INCREASE Toprol  to 50 mg daily  *If you need a refill on your cardiac medications before your next appointment, please call your pharmacy*  Lab Work: Your physician recommends that you return for lab work in: 1 week for a BMET  If you have any lab test that is abnormal or we need to change your treatment, we will call you to review the results.  Testing/Procedures: None ordered  Follow-Up: At [facility], you and your health needs are our priority.  As part of our continuing mission to provide you with exceptional heart care, our providers are all part of one team.  This team includes your primary Cardiologist (physician) and Advanced Practice Providers or APPs (Physician Assistants and Nurse Practitioners) who all work together to provide you with the care you need, when you need it.  Your next appointment:   3 month(s)  Provider:   Lonni LITTIE Nanas, MD     Thank you for choosing [facility]!!   [phone]

## 2024-02-21 ENCOUNTER — Other Ambulatory Visit (HOSPITAL_COMMUNITY): Payer: Self-pay

## 2024-02-27 NOTE — Progress Notes (Unsigned)
 Patient ID: Larry Lewis                 DOB: 08-Jul-1965                      MRN: [id number]     HPI: Larry Lewis is a 58 y.o. male referred by Dr. Kate to pharmacy clinic for medication management. PMH is significant for systolic and diastolic heart failure, severe multivessel CAD, DM, HTN, HLD, asthma, and tobacco use. Most recent LVEF 45-50% on 04/2021.  Patient was last evaluated by Dr. Kate in November 2025 for CAD follow up. Patient was in [place] for over 1 year and is now back in [place]  for a few months, will likely return to [place] in February. He has been off all his medications for months. He no longer has medicaid and is now self pay and in the process of reapplying for Medicaid. Unable to represcribe a few of his medications (Entresto , Farxiga  and Eliquis ) as he is unable to afford. Patient referred to PharmD for medication management   Medications have been added back slowly (toprol , ASA, NTG, atorva 80, imdur )  Discussion with patient today included the following: cardiac medication indications, introduction to GDMT clinic, reasoning behind medication titration, importance of medication adherence, and patient engagement. . At last visit with MD ***. Symptomatically, she is feeling ***, *** dizziness, lightheadedness, and fatigue. *** chest pain or palpitations. Feels SOB when ***. Able to complete all ADLs. Activity level ***. She *** checks her weight at home (normal range *** - *** lbs). *** LEE, PND, or orthopnea. Appetite has been ***. She *** adheres to a low-salt diet.   Current CHF meds:  Previously tried:  Adherence Assessment  Do you ever forget to take your medication? [] Yes [] No  Do you ever skip doses due to side effects? [] Yes [] No  Do you have trouble affording your medicines? [] Yes [] No  Are you ever unable to pick up your medication due to transportation difficulties? [] Yes [] No  Do you ever stop taking your medications because you don't believe they  are helping? [] Yes [] No  Do you check your weight daily? [] Yes [] No   Adherence strategy: ***  Barriers to obtaining medications: yes, cost prohibitive   BP goal: < 130/80  Family History:  ??  Social History:  Alcohol: Smoking:  Diet:  Breakfast: Lunch/Dinner: Snacks: Beverages:  Exercise:   Home BP readings:   Wt Readings from Last 3 Encounters:  02/13/24 219 lb 3.2 oz (99.4 kg)  01/30/24 221 lb (100.2 kg)  08/25/22 206 lb 3.2 oz (93.5 kg)   BP Readings from Last 3 Encounters:  02/13/24 135/84  01/30/24 128/70  08/25/22 118/60   Pulse Readings from Last 3 Encounters:  02/13/24 87  01/30/24 99  08/25/22 85    Renal function: Estimated Creatinine Clearance: 80.4 mL/min (by C-G formula based on SCr of 1.15 mg/dL).  Past Medical History:  Diagnosis Date   Anxiety    Asthma    Chronic combined systolic and diastolic CHF (congestive heart failure) (HCC)    mildly reduced EF of 45-50% on Echo in 04/2021   Coronary artery disease    s/p 2 stents to RCA in 2007 in [place], [place]   Fall at home, initial encounter 03/24/2020   Hyperlipidemia    Hypertension    Paroxysmal atrial fibrillation (HCC)    Tobacco abuse    Type 2 diabetes mellitus (HCC)     Current Outpatient  Medications on File Prior to Visit  Medication Sig Dispense Refill   acetaminophen  (TYLENOL ) 325 MG tablet Take 2 tablets (650 mg total) by mouth every 4 (four) hours as needed for headache or mild pain. (Patient not taking: Reported on 02/13/2024)     apixaban  (ELIQUIS ) 5 MG TABS tablet Take 1 tablet (5 mg total) by mouth 2 (two) times daily. (Patient not taking: Reported on 02/13/2024) 180 tablet 3   aspirin  EC 81 MG tablet Take 1 tablet (81 mg total) by mouth daily. Swallow whole. 90 tablet 3   atorvastatin  (LIPITOR ) 80 MG tablet Take 1 tablet (80 mg total) by mouth daily. 90 tablet 3   busPIRone  (BUSPAR ) 10 MG tablet Take 5 mg by mouth 2 (two) times daily. (Patient not taking: Reported on  02/13/2024)     busPIRone  (BUSPAR ) 5 MG tablet Take 5 mg by mouth 2 (two) times daily. (Patient not taking: Reported on 02/13/2024)     Continuous Blood Gluc Sensor (DEXCOM G6 SENSOR) MISC 1 Device by Does not apply route as directed. 9 each 2   Continuous Blood Gluc Transmit (DEXCOM G6 TRANSMITTER) MISC 1 Device by Does not apply route as directed. 1 each 2   ezetimibe  (ZETIA ) 10 MG tablet Take 1 tablet (10 mg total) by mouth daily. 90 tablet 3   furosemide  (LASIX ) 20 MG tablet Take 1 tablet (20 mg total) by mouth daily. 90 tablet 3   gabapentin  (NEURONTIN ) 100 MG capsule Take 1 capsule (100 mg total) by mouth at bedtime. (Patient not taking: Reported on 02/13/2024) 90 capsule 3   guaiFENesin  (MUCINEX ) 600 MG 12 hr tablet Take 1,200 mg by mouth 2 (two) times daily as needed for to loosen phlegm. (Patient not taking: Reported on 02/13/2024)     insulin  aspart (NOVOLOG ) 100 UNIT/ML FlexPen Max daily 50 units 45 mL 2   Insulin  Glargine (BASAGLAR  KWIKPEN) 100 UNIT/ML Inject 30 Units into the skin 2 (two) times daily. 60 mL 4   Insulin  Pen Needle 29G X MISC 1 Device by Does not apply route in the morning, at noon, in the evening, and at bedtime. 400 each 2   Insulin  Pen Needle 32G X 4 MM MISC 1 Device by Does not apply route in the morning, at noon, in the evening, and at bedtime. 400 each 3   isosorbide  mononitrate (IMDUR ) 30 MG 24 hr tablet Take 1 tablet (30 mg total) by mouth daily. 90 tablet 3   losartan  (COZAAR ) 25 MG tablet Take 1 tablet (25 mg total) by mouth daily. 30 tablet 3   metFORMIN  (GLUCOPHAGE ) 1000 MG tablet Take 1 tablet (1,000 mg total) by mouth 2 (two) times daily with a meal. 180 tablet 2   metoprolol  succinate (TOPROL -XL) 50 MG 24 hr tablet Take 1 tablet (50 mg total) by mouth daily. Take with or immediately following a meal. 90 tablet 2   nitroGLYCERIN  (NITROSTAT ) 0.4 MG SL tablet Place 1 tablet (0.4 mg total) under the tongue every 5 (five) minutes x 3 doses as needed for chest  pain. 75 tablet 1   omeprazole  (PRILOSEC) 20 MG capsule TAKE 1 CAPSULE BY MOUTH EVERY DAY (Patient not taking: Reported on 02/13/2024) 90 capsule 3   potassium chloride  SA (KLOR-CON  M) 20 MEQ tablet Take 1 tablet (20 mEq total) by mouth daily. (Patient not taking: Reported on 02/13/2024) 90 tablet 3   traZODone  (DESYREL ) 50 MG tablet TAKE 1/2 TO 1 TABLET BY MOUTH AT BEDTIME AS NEEDED FOR SLEEP (Patient  not taking: Reported on 02/13/2024) 30 tablet 1   No current facility-administered medications on file prior to visit.    Allergies  Allergen Reactions   Penicillins Itching     Assessment/Plan:  1. CHF -  There are no diagnoses linked to this encounter.     Thank you   Kashawn Manzano E. Illias Pantano, Pharm.D, CPP [place] [facility]. [facility] [address], [place], [place] [address] Phone: [phone]; Fax: [phone]

## 2024-02-29 ENCOUNTER — Other Ambulatory Visit (HOSPITAL_COMMUNITY): Payer: Self-pay

## 2024-02-29 ENCOUNTER — Ambulatory Visit: Payer: Self-pay | Attending: Internal Medicine

## 2024-02-29 ENCOUNTER — Other Ambulatory Visit: Payer: Self-pay | Admitting: Cardiology

## 2024-02-29 VITALS — BP 121/77 | HR 96

## 2024-02-29 DIAGNOSIS — I5042 Chronic combined systolic (congestive) and diastolic (congestive) heart failure: Secondary | ICD-10-CM

## 2024-02-29 LAB — BASIC METABOLIC PANEL WITH GFR
BUN/Creatinine Ratio: 17 (ref 9–20)
BUN: 21 mg/dL (ref 6–24)
CO2: 22 mmol/L (ref 20–29)
Calcium: 9.7 mg/dL (ref 8.7–10.2)
Chloride: 96 mmol/L (ref 96–106)
Creatinine, Ser: 1.23 mg/dL (ref 0.76–1.27)
Glucose: 275 mg/dL — ABNORMAL HIGH (ref 70–99)
Potassium: 4.2 mmol/L (ref 3.5–5.2)
Sodium: 135 mmol/L (ref 134–144)
eGFR: 68 mL/min/1.73 (ref >59)

## 2024-02-29 MED ORDER — OMRON 3 SERIES BP MONITOR DEVI
1.0000 | Freq: Every day | 0 refills | Status: AC
  Filled 2024-02-29: qty 1, 30d supply, fill #0

## 2024-02-29 NOTE — Patient Instructions (Addendum)
 Changes made by your pharmacist Larry Lewis, PharmD, CPP at today's visit:    Instructions/Changes  (what do you need to do) Your Notes  (what you did Larry when you did it)  Please continue metoprolol  50 mg daily, furosemide  20 mg daily, losartan  25 mg daily, isosorbide  mononitrate 30 mg daily, Larry nitroglycerine as needed   2. Obtain lab work downstairs today   3. Check your blood pressure a couple      times per week Larry record in blood      pressure log   4. Continue following up with Medicaid     approval. I will be working on seeking     assistance for your heart medications    Bring all of your meds, your BP cuff Larry your record of home blood pressures to your next appointment.   Larry Lewis E. Laurelin Larry Lewis, Pharm.D, CPP [place] [facility]. [facility] [address], [place], [place] [address] Phone: [phone]; Fax: [phone]     HOW TO TAKE YOUR BLOOD PRESSURE AT HOME  Rest 5 minutes before taking your blood pressure.  Dont smoke or drink caffeinated beverages for at least 30 minutes before. Take your blood pressure before (not after) you eat. Sit comfortably with your back supported Larry both feet on the floor (dont cross your legs). Elevate your arm to heart level on a table or a desk. Use the proper sized cuff. It should fit smoothly Larry snugly around your bare upper arm. There should be enough room to slip a fingertip under the cuff. The bottom edge of the cuff should be 1 inch above the crease of the elbow. Ideally, take 3 measurements at one sitting Larry record the average.  Important lifestyle changes to control high blood pressure  Intervention  Effect on the BP  Lose extra pounds Larry watch your waistline Weight loss is one of the most effective lifestyle changes for controlling blood pressure. If you're overweight or obese, losing even a small amount of weight can help reduce blood pressure. Blood pressure might go down by about  1 millimeter of mercury (mm Hg) with each kilogram (about 2.2 pounds) of weight lost.  Exercise regularly As a general goal, aim for at least 30 minutes of moderate physical activity every day. Regular physical activity can lower high blood pressure by about 5 to 8 mm Hg.  Eat a healthy diet Eating a diet rich in whole grains, fruits, vegetables, Larry low-fat dairy products Larry low in saturated fat Larry cholesterol. A healthy diet can lower high blood pressure by up to 11 mm Hg.  Reduce salt (sodium) in your diet Even a small reduction of sodium in the diet can improve heart health Larry reduce high blood pressure by about 5 to 6 mm Hg.  Limit alcohol One drink equals 12 ounces of beer, 5 ounces of wine, or 1.5 ounces of 80-proof liquor.  Limiting alcohol to less than one drink a day for women or two drinks a day for men can help lower blood pressure by about 4 mm Hg.   If you have any questions or concerns please use My Chart to send questions or call the office at [phone]

## 2024-02-29 NOTE — Assessment & Plan Note (Addendum)
 Assessment: BP is controlled in office BP 121/ 77 mmHg below the goal (<130/80). HR 97 bpm. No home BP readings available; prescription for Omron monitor sent to pharmacy Reviewed BP/HR goals and instructed patient to check BP regularly and bring to follow up appointments Tolerates current HF regimen well and reports no side effects Reports dyspnea and chest pain with exertion and persistent fatigue.  Discussed signs of MI and stroke and when to seek immediate attention Reinforced importance of regular exercise and low-sodium diet; provided and reviewed Planning Healthy Meals handout Patient is reapplying for Medicaid; unable to restart several previous medications (Eliquis , Entresto , Farxiga ) due to cost. Will seek assistance options   Plan:  Continue taking metoprolol  50 mg daily, furosemide  20 mg daily, losartan  25 mg daily, isosorbide  mononitrate 30 mg daily and nitroglycerin  prn  Patient to keep record of BP readings with heart rate and report at next provider visit Pharmacy team will assist with medication access and follow up Instructed patient to obtain BMP today since losartan  initiation in November 2025 Echo scheduled 03/08/24; Provider visit 05/2023.

## 2024-03-01 ENCOUNTER — Telehealth: Payer: Self-pay

## 2024-03-01 NOTE — Telephone Encounter (Signed)
 PAP: Patient assistance application for Eliquis  through [employer] ([employer]) has been mailed to pt's home address on file. Provider portion of application will be faxed to provider's office once pt portion has been received.

## 2024-03-01 NOTE — Telephone Encounter (Signed)
 Yes, we will mail patient Eliquis  [employer] forms to complete.

## 2024-03-03 ENCOUNTER — Ambulatory Visit: Payer: Self-pay | Admitting: Cardiology

## 2024-03-08 ENCOUNTER — Ambulatory Visit (HOSPITAL_COMMUNITY)
Admission: RE | Admit: 2024-03-08 | Discharge: 2024-03-08 | Disposition: A | Discharge status: Home / Self Care | Payer: Self-pay | Source: Ambulatory Visit | Attending: Cardiology | Admitting: Cardiology

## 2024-03-08 DIAGNOSIS — I5042 Chronic combined systolic (congestive) and diastolic (congestive) heart failure: Secondary | ICD-10-CM

## 2024-03-10 LAB — ECHOCARDIOGRAM COMPLETE
AR max vel: 1.81 cm2
AV Area VTI: 1.9 cm2
AV Area mean vel: 1.87 cm2
AV Mean grad: 14 mmHg
AV Peak grad: 24.6 mmHg
Ao pk vel: 2.48 m/s
Area-P 1/2: 4.46 cm2
P 1/2 time: 242 ms
S' Lateral: 3 cm

## 2024-03-19 NOTE — Telephone Encounter (Signed)
 Called patient to check on status of PAP for Eliquis .  PAP mailed to patients home ~2 weeks ago. NR-LVM

## 2024-03-25 NOTE — Telephone Encounter (Signed)
 Pt requesting a c/b in regards to previous phone note.

## 2024-04-01 ENCOUNTER — Other Ambulatory Visit (HOSPITAL_COMMUNITY): Payer: Self-pay

## 2024-04-04 ENCOUNTER — Telehealth: Payer: Self-pay | Admitting: Pharmacy Technician

## 2024-04-04 ENCOUNTER — Other Ambulatory Visit (HOSPITAL_COMMUNITY): Payer: Self-pay

## 2024-04-04 NOTE — Telephone Encounter (Signed)
 Ran test claim for eliquis . For a 30 day supply and the co-pay is 4.00 . PA is not needed at this time. This test claim was processed through [facility]- copay amounts may vary at other pharmacies due to pharmacy/plan contracts, or as the patient moves through the different stages of their insurance plan.

## 2024-04-05 MED ORDER — APIXABAN 5 MG PO TABS: 5.0000 mg | ORAL_TABLET | Freq: Two times a day (BID) | ORAL | 3 refills | Status: AC

## 2024-04-05 NOTE — Telephone Encounter (Signed)
 Yes recommend restarting Eliquis  5 mg twice daily

## 2024-04-05 NOTE — Addendum Note (Signed)
 Addended by: Anslee Micheletti E on: 04/05/2024 10:41 AM   Modules accepted: Orders

## 2024-04-05 NOTE — Telephone Encounter (Signed)
 Spoke with patient friend, Sherrod. Advised to restart Eliquis  twice daily, per Dr Kate. Pharmacy assisting patient with getting this sent in. Verbalizes understanding of plan and appreciative of communication and assistance.

## 2024-04-05 NOTE — Telephone Encounter (Signed)
 Called patient and discussed Eliquis  5 mg twice daily is to be restarted. Rx sent. Patient has not been logging blood pressures daily - instructed him to do so and bring monitor and log to next appointment. PharmD f/u appointment scheduled.

## 2024-05-07 ENCOUNTER — Ambulatory Visit

## 2024-05-21 ENCOUNTER — Ambulatory Visit: Payer: Self-pay | Admitting: Cardiology
